# Patient Record
Sex: Female | Born: 1955
Health system: Southern US, Community
[De-identification: ages and names within clinical notes are randomized; demographics above are authoritative.]

## PROBLEM LIST (undated history)

## (undated) DIAGNOSIS — I213 ST elevation (STEMI) myocardial infarction of unspecified site: Secondary | ICD-10-CM

## (undated) DIAGNOSIS — E7800 Pure hypercholesterolemia, unspecified: Secondary | ICD-10-CM

## (undated) DIAGNOSIS — K5090 Crohn's disease, unspecified, without complications: Secondary | ICD-10-CM

## (undated) DIAGNOSIS — E785 Hyperlipidemia, unspecified: Secondary | ICD-10-CM

## (undated) DIAGNOSIS — K219 Gastro-esophageal reflux disease without esophagitis: Secondary | ICD-10-CM

## (undated) DIAGNOSIS — I2510 Atherosclerotic heart disease of native coronary artery without angina pectoris: Secondary | ICD-10-CM

## (undated) DIAGNOSIS — I10 Essential (primary) hypertension: Secondary | ICD-10-CM

## (undated) DIAGNOSIS — K5010 Crohn's disease of large intestine without complications: Secondary | ICD-10-CM

## (undated) HISTORY — DX: Hyperlipidemia, unspecified: E78.5

## (undated) HISTORY — PX: SMALL INTESTINE SURGERY: SHX150

## (undated) HISTORY — PX: TONSILLECTOMY: SUR1361

---

## 2003-04-14 HISTORY — PX: CHOLECYSTECTOMY: SHX55

## 2012-04-13 HISTORY — PX: BACK SURGERY: SHX140

## 2013-04-28 ENCOUNTER — Ambulatory Visit
Admission: RE | Admit: 2013-04-28 | Discharge: 2013-04-28 | Disposition: A | Discharge status: Home / Self Care | Payer: No Typology Code available for payment source | Source: Ambulatory Visit | Attending: Family Medicine | Admitting: Family Medicine

## 2013-04-28 ENCOUNTER — Other Ambulatory Visit: Payer: Self-pay | Admitting: Family Medicine

## 2013-04-28 DIAGNOSIS — J189 Pneumonia, unspecified organism: Secondary | ICD-10-CM

## 2017-02-18 ENCOUNTER — Emergency Department (HOSPITAL_COMMUNITY)
Admission: EM | Admit: 2017-02-18 | Discharge: 2017-02-19 | Disposition: A | Discharge status: Home / Self Care | Payer: BLUE CROSS/BLUE SHIELD | Attending: Emergency Medicine | Admitting: Emergency Medicine

## 2017-02-18 ENCOUNTER — Encounter (HOSPITAL_COMMUNITY): Payer: Self-pay | Admitting: Nurse Practitioner

## 2017-02-18 DIAGNOSIS — F17210 Nicotine dependence, cigarettes, uncomplicated: Secondary | ICD-10-CM | POA: Insufficient documentation

## 2017-02-18 DIAGNOSIS — E876 Hypokalemia: Secondary | ICD-10-CM

## 2017-02-18 DIAGNOSIS — N390 Urinary tract infection, site not specified: Secondary | ICD-10-CM | POA: Diagnosis not present

## 2017-02-18 DIAGNOSIS — Z79899 Other long term (current) drug therapy: Secondary | ICD-10-CM | POA: Insufficient documentation

## 2017-02-18 DIAGNOSIS — R42 Dizziness and giddiness: Secondary | ICD-10-CM | POA: Diagnosis present

## 2017-02-18 DIAGNOSIS — N179 Acute kidney failure, unspecified: Secondary | ICD-10-CM | POA: Diagnosis not present

## 2017-02-18 HISTORY — DX: Crohn's disease of large intestine without complications: K50.10

## 2017-02-18 LAB — CBC
HCT: 39.1 % (ref 36.0–46.0)
Hemoglobin: 14.1 g/dL (ref 12.0–15.0)
MCH: 31.8 pg (ref 26.0–34.0)
MCHC: 36.1 g/dL — ABNORMAL HIGH (ref 30.0–36.0)
MCV: 88.1 fL (ref 78.0–100.0)
Platelets: 486 10*3/uL — ABNORMAL HIGH (ref 150–400)
RBC: 4.44 MIL/uL (ref 3.87–5.11)
RDW: 14 % (ref 11.5–15.5)
WBC: 15 10*3/uL — ABNORMAL HIGH (ref 4.0–10.5)

## 2017-02-18 LAB — URINALYSIS, ROUTINE W REFLEX MICROSCOPIC
Bilirubin Urine: NEGATIVE
Glucose, UA: NEGATIVE mg/dL
Ketones, ur: NEGATIVE mg/dL
Nitrite: NEGATIVE
Protein, ur: 100 mg/dL — AB
Specific Gravity, Urine: 1.028 (ref 1.005–1.030)
pH: 6 (ref 5.0–8.0)

## 2017-02-18 LAB — BASIC METABOLIC PANEL
Anion gap: 10 (ref 5–15)
BUN: 24 mg/dL — ABNORMAL HIGH (ref 6–20)
CO2: 17 mmol/L — ABNORMAL LOW (ref 22–32)
Calcium: 8.5 mg/dL — ABNORMAL LOW (ref 8.9–10.3)
Chloride: 109 mmol/L (ref 101–111)
Creatinine, Ser: 2.03 mg/dL — ABNORMAL HIGH (ref 0.44–1.00)
GFR calc Af Amer: 29 mL/min — ABNORMAL LOW (ref >60)
GFR calc non Af Amer: 25 mL/min — ABNORMAL LOW (ref >60)
Glucose, Bld: 172 mg/dL — ABNORMAL HIGH (ref 65–99)
Potassium: 2.6 mmol/L — CL (ref 3.5–5.1)
Sodium: 136 mmol/L (ref 135–145)

## 2017-02-18 MED ORDER — SODIUM CHLORIDE 0.9 % IV BOLUS (SEPSIS)
2000.0000 mL | Freq: Once | INTRAVENOUS | Status: AC
Start: 2017-02-18 — End: 2017-02-19
  Administered 2017-02-18: 2000 mL via INTRAVENOUS

## 2017-02-18 MED ORDER — MAGNESIUM SULFATE 2 GM/50ML IV SOLN
2.0000 g | Freq: Once | INTRAVENOUS | Status: AC
  Administered 2017-02-18: 2 g via INTRAVENOUS
  Filled 2017-02-18: qty 50

## 2017-02-18 MED ORDER — POTASSIUM CHLORIDE 10 MEQ/100ML IV SOLN
10.0000 meq | INTRAVENOUS | Status: AC
  Administered 2017-02-18 (×2): 10 meq via INTRAVENOUS
  Filled 2017-02-18 (×2): qty 100

## 2017-02-18 MED ORDER — POTASSIUM CHLORIDE CRYS ER 20 MEQ PO TBCR
40.0000 meq | EXTENDED_RELEASE_TABLET | Freq: Once | ORAL | Status: AC
  Administered 2017-02-18: 40 meq via ORAL
  Filled 2017-02-18: qty 2

## 2017-02-18 MED ORDER — LIDOCAINE HCL (PF) 1 % IJ SOLN
INTRAMUSCULAR | Status: AC
  Administered 2017-02-18: 2.1 mL
  Filled 2017-02-18: qty 5

## 2017-02-18 MED ORDER — CEFTRIAXONE SODIUM 1 G IJ SOLR
1.0000 g | Freq: Once | INTRAMUSCULAR | Status: AC
  Administered 2017-02-18: 1 g via INTRAMUSCULAR
  Filled 2017-02-18: qty 10

## 2017-02-18 MED ORDER — SODIUM CHLORIDE 0.9 % IV BOLUS (SEPSIS): 1000.0000 mL | Freq: Once | INTRAVENOUS | Status: DC

## 2017-02-18 NOTE — ED Notes (Signed)
Dr. Dolly Rias notified on pt.'s hypokalemia.

## 2017-02-18 NOTE — ED Triage Notes (Signed)
Pt endorses dizziness upon awakening from bed over the past 4-5 days. States dizziness begins when she goes from laying to standing and lasts for hours. Pt endorses generalized weakness. Pt denies cp, blurry vision, slurred speech, syncope, falls.

## 2017-02-19 ENCOUNTER — Emergency Department (HOSPITAL_COMMUNITY): Payer: BLUE CROSS/BLUE SHIELD

## 2017-02-19 MED ORDER — POTASSIUM CHLORIDE CRYS ER 20 MEQ PO TBCR: 40.0000 meq | EXTENDED_RELEASE_TABLET | Freq: Two times a day (BID) | ORAL | 0 refills | Status: DC

## 2017-02-19 MED ORDER — CEPHALEXIN 500 MG PO CAPS: 500.0000 mg | ORAL_CAPSULE | Freq: Four times a day (QID) | ORAL | 0 refills | Status: DC

## 2017-02-19 NOTE — ED Provider Notes (Signed)
[facility] EMERGENCY DEPARTMENT Provider Note   CSN: [id number] Arrival date & time: 02/18/17  1931     History   Chief Complaint Chief Complaint  Patient presents with  . Dizziness    HPI Anne [name] is a 61 y.o. female.   Dizziness  Quality:  Lightheadedness Severity:  Moderate Onset quality:  Gradual Duration:  1 day Timing:  Constant Chronicity:  Recurrent Relieved by:  None tried Worsened by:  Nothing Ineffective treatments:  None tried   Past Medical History:  Diagnosis Date  . Crohn's colitis ([place])     There are no active problems to display for this patient.   Past Surgical History:  Procedure Laterality Date  . SMALL INTESTINE SURGERY      OB History    No data available       Home Medications    Prior to Admission medications   Medication Sig Start Date End Date Taking? Authorizing Provider  AZASAN 75 MG TABS Take 150 mg daily by mouth. 01/25/17  Yes [provider]  calcium carbonate (OSCAL) 1500 (600 Ca) MG TABS tablet Take 600 mg of elemental calcium 2 (two) times daily with a meal by mouth.   Yes [provider]  cyclobenzaprine (FLEXERIL) 10 MG tablet Take 10 mg at bedtime by mouth. 01/14/17  Yes [provider]  famotidine (PEPCID) 40 MG tablet Take 40 mg at bedtime by mouth. 01/14/17  Yes [provider]  vitamin E 100 UNIT capsule Take 100 Units daily by mouth.   Yes [provider]  vitamin E 600 UNIT capsule Take 600 Units 2 (two) times daily by mouth.   Yes [provider]  cephALEXin (KEFLEX) 500 MG capsule Take 1 capsule (500 mg total) 4 (four) times daily by mouth. 02/19/17   Shriya Aker, Corene Cornea, MD  potassium chloride SA (K-DUR,KLOR-CON) 20 MEQ tablet Take 2 tablets (40 mEq total) 2 (two) times daily for 14 days by mouth. 02/19/17 03/05/17  Liliana Dang, Corene Cornea, MD    Family History No family history on file.  Social History Social History   Tobacco Use  .  Smoking status: Current Some Day Smoker    Packs/day: 0.50  . Smokeless tobacco: Never Used  Substance Use Topics  . Alcohol use: No    Frequency: Never  . Drug use: No     Allergies   Patient has no known allergies.   Review of Systems Review of Systems  Neurological: Positive for dizziness.  All other systems reviewed and are negative.    Physical Exam Updated Vital Signs BP 98/75   Pulse (!) 101   Temp 97.9 F (36.6 C) (Axillary)   Resp (!) 21   Ht 5' 9"  (1.753 m)   Wt 74.8 kg (165 lb)   LMP 02/18/2001   SpO2 99%   BMI 24.37 kg/m   Physical Exam  Constitutional: She is oriented to person, place, and time. She appears well-developed and well-nourished.  HENT:  Head: Normocephalic and atraumatic.  Eyes: Conjunctivae and EOM are normal.  Neck: Normal range of motion.  Cardiovascular: Regular rhythm. Tachycardia present.  Pulmonary/Chest: Effort normal and breath sounds normal. No stridor. No respiratory distress.  Abdominal: Soft. Bowel sounds are normal. She exhibits no distension.  Musculoskeletal: Normal range of motion. She exhibits no edema or deformity.  Neurological: She is alert and oriented to person, place, and time. No cranial nerve deficit. Coordination normal.  Skin: Skin is warm and dry.  Nursing note  and vitals reviewed.    ED Treatments / Results  Labs (all labs ordered are listed, but only abnormal results are displayed) Labs Reviewed  BASIC METABOLIC PANEL - Abnormal; Notable for the following components:      Result Value   Potassium 2.6 (*)    CO2 17 (*)    Glucose, Bld 172 (*)    BUN 24 (*)    Creatinine, Ser 2.03 (*)    Calcium 8.5 (*)    GFR calc non Af Amer 25 (*)    GFR calc Af Amer 29 (*)    All other components within normal limits  CBC - Abnormal; Notable for the following components:   WBC 15.0 (*)    MCHC 36.1 (*)    Platelets 486 (*)    All other components within normal limits  URINALYSIS, ROUTINE W REFLEX  MICROSCOPIC - Abnormal; Notable for the following components:   Color, Urine AMBER (*)    APPearance HAZY (*)    Hgb urine dipstick MODERATE (*)    Protein, ur 100 (*)    Leukocytes, UA MODERATE (*)    Bacteria, UA FEW (*)    Squamous Epithelial / LPF 0-5 (*)    All other components within normal limits  URINE CULTURE    EKG  EKG Interpretation  Date/Time:  Thursday February 18 2017 19:38:51 EST Ventricular Rate:  118 PR Interval:  112 QRS Duration: 80 QT Interval:  322 QTC Calculation: 451 R Axis:   58 Text Interpretation:  Sinus tachycardia Low voltage QRS Nonspecific ST and T wave abnormality Abnormal ECG Confirmed by Merrily Pew [phone]) on 02/18/2017 8:36:09 PM       Radiology No results found.  Procedures Procedures (including critical care time)  Medications Ordered in ED Medications  sodium chloride 0.9 % bolus 2,000 mL (2,000 mLs Intravenous New Bag/Given 02/18/17 2209)  magnesium sulfate IVPB 2 g 50 mL (2 g Intravenous New Bag/Given 02/18/17 2200)  potassium chloride 10 mEq in 100 mL IVPB (10 mEq Intravenous New Bag/Given 02/18/17 2326)  potassium chloride SA (K-DUR,KLOR-CON) CR tablet 40 mEq (40 mEq Oral Given 02/18/17 2200)  cefTRIAXone (ROCEPHIN) injection 1 g (1 g Intramuscular Given 02/18/17 2159)  lidocaine (PF) (XYLOCAINE) 1 % injection (2.1 mLs  Given 02/18/17 2200)     Initial Impression / Assessment and Plan / ED Course  I have reviewed the triage vital signs and the nursing notes.  Pertinent labs & imaging results that were available during my care of the patient were reviewed by me and considered in my medical decision making (see chart for details).     H/o crohns and thus has persistently abnormal potassium and has not been taking it recently for some reason. Found to have UTI likely relating to dehydration and hypokalemia. repleted here, will start K at home as well. Already has an appointment with primary providers on Tuesday so will follow with  them for recheck.   Final Clinical Impressions(s) / ED Diagnoses   Final diagnoses:  Hypokalemia  AKI (acute kidney injury) ([place])  Urinary tract infection without hematuria, site unspecified    ED Discharge Orders        Ordered    potassium chloride SA (K-DUR,KLOR-CON) 20 MEQ tablet  2 times daily     02/19/17 0051    cephALEXin (KEFLEX) 500 MG capsule  4 times daily     02/19/17 0051       Thierry Dobosz, Corene Cornea, MD 02/20/17 [phone]

## 2017-02-19 NOTE — ED Notes (Signed)
Patient transported to X-ray 

## 2017-02-19 NOTE — ED Notes (Signed)
Patient returned from X-ray 

## 2017-02-21 LAB — URINE CULTURE: Culture: >=100000 — AB

## 2017-02-22 ENCOUNTER — Telehealth: Payer: Self-pay | Admitting: Emergency Medicine

## 2017-02-22 NOTE — Telephone Encounter (Signed)
Post ED Visit - Positive Culture Follow-up  Culture report reviewed by antimicrobial stewardship pharmacist:  []  Elenor Quinones, Pharm.D. []  Heide Guile, Pharm.D., BCPS AQ-ID []  Parks Neptune, Pharm.D., BCPS [x]  Alycia Rossetti, Pharm.D., BCPS []  [place], Pharm.D., BCPS, AAHIVP []  Legrand Como, Pharm.D., BCPS, AAHIVP []  Salome Arnt, PharmD, BCPS []  Dimitri Ped, PharmD, BCPS []  Vincenza Hews, PharmD, BCPS  Positive urine culture Treated with cephalexin, organism sensitive to the same and no further patient follow-up is required at this time.  Hazle Nordmann 02/22/2017, 12:24 PM

## 2017-06-27 ENCOUNTER — Other Ambulatory Visit: Payer: Self-pay

## 2017-06-27 ENCOUNTER — Emergency Department (HOSPITAL_COMMUNITY)
Admission: EM | Admit: 2017-06-27 | Discharge: 2017-06-27 | Disposition: A | Discharge status: Home / Self Care | Payer: BLUE CROSS/BLUE SHIELD | Attending: Emergency Medicine | Admitting: Emergency Medicine

## 2017-06-27 ENCOUNTER — Encounter (HOSPITAL_COMMUNITY): Payer: Self-pay

## 2017-06-27 DIAGNOSIS — N390 Urinary tract infection, site not specified: Secondary | ICD-10-CM | POA: Insufficient documentation

## 2017-06-27 DIAGNOSIS — F17210 Nicotine dependence, cigarettes, uncomplicated: Secondary | ICD-10-CM | POA: Diagnosis not present

## 2017-06-27 DIAGNOSIS — R531 Weakness: Secondary | ICD-10-CM | POA: Diagnosis present

## 2017-06-27 DIAGNOSIS — Z79899 Other long term (current) drug therapy: Secondary | ICD-10-CM | POA: Insufficient documentation

## 2017-06-27 LAB — COMPREHENSIVE METABOLIC PANEL
ALT: 17 U/L (ref 14–54)
AST: 21 U/L (ref 15–41)
Albumin: 4.1 g/dL (ref 3.5–5.0)
Alkaline Phosphatase: 67 U/L (ref 38–126)
Anion gap: 10 (ref 5–15)
BUN: 17 mg/dL (ref 6–20)
CO2: 15 mmol/L — ABNORMAL LOW (ref 22–32)
Calcium: 8.6 mg/dL — ABNORMAL LOW (ref 8.9–10.3)
Chloride: 113 mmol/L — ABNORMAL HIGH (ref 101–111)
Creatinine, Ser: 1.58 mg/dL — ABNORMAL HIGH (ref 0.44–1.00)
GFR calc Af Amer: 39 mL/min — ABNORMAL LOW (ref >60)
GFR calc non Af Amer: 34 mL/min — ABNORMAL LOW (ref >60)
Glucose, Bld: 85 mg/dL (ref 65–99)
Potassium: 3.2 mmol/L — ABNORMAL LOW (ref 3.5–5.1)
Sodium: 138 mmol/L (ref 135–145)
Total Bilirubin: 0.7 mg/dL (ref 0.3–1.2)
Total Protein: 7.7 g/dL (ref 6.5–8.1)

## 2017-06-27 LAB — CBC
HCT: 41.3 % (ref 36.0–46.0)
Hemoglobin: 14.2 g/dL (ref 12.0–15.0)
MCH: 33.5 pg (ref 26.0–34.0)
MCHC: 34.4 g/dL (ref 30.0–36.0)
MCV: 97.4 fL (ref 78.0–100.0)
Platelets: 417 10*3/uL — ABNORMAL HIGH (ref 150–400)
RBC: 4.24 MIL/uL (ref 3.87–5.11)
RDW: 15 % (ref 11.5–15.5)
WBC: 7.7 10*3/uL (ref 4.0–10.5)

## 2017-06-27 LAB — URINALYSIS, ROUTINE W REFLEX MICROSCOPIC
Bacteria, UA: NONE SEEN
Bilirubin Urine: NEGATIVE
Glucose, UA: NEGATIVE mg/dL
Ketones, ur: NEGATIVE mg/dL
Nitrite: NEGATIVE
Protein, ur: 100 mg/dL — AB
Specific Gravity, Urine: 1.019 (ref 1.005–1.030)
pH: 5 (ref 5.0–8.0)

## 2017-06-27 LAB — LIPASE, BLOOD: Lipase: 47 U/L (ref 11–51)

## 2017-06-27 MED ORDER — SODIUM CHLORIDE 0.9 % IV BOLUS (SEPSIS)
1000.0000 mL | Freq: Once | INTRAVENOUS | Status: AC
  Administered 2017-06-27: 1000 mL via INTRAVENOUS

## 2017-06-27 MED ORDER — POTASSIUM CHLORIDE CRYS ER 20 MEQ PO TBCR: 20.0000 meq | EXTENDED_RELEASE_TABLET | Freq: Every day | ORAL | 0 refills | Status: DC

## 2017-06-27 MED ORDER — POTASSIUM CHLORIDE CRYS ER 20 MEQ PO TBCR
40.0000 meq | EXTENDED_RELEASE_TABLET | Freq: Once | ORAL | Status: AC
  Administered 2017-06-27: 40 meq via ORAL
  Filled 2017-06-27: qty 2

## 2017-06-27 MED ORDER — HYDROMORPHONE HCL 1 MG/ML IJ SOLN
0.7500 mg | Freq: Once | INTRAMUSCULAR | Status: AC
  Administered 2017-06-27: 0.75 mg via INTRAVENOUS
  Filled 2017-06-27: qty 1

## 2017-06-27 MED ORDER — SODIUM CHLORIDE 0.9 % IV SOLN
2.0000 g | Freq: Once | INTRAVENOUS | Status: AC
  Administered 2017-06-27: 2 g via INTRAVENOUS
  Filled 2017-06-27: qty 20

## 2017-06-27 MED ORDER — CEPHALEXIN 500 MG PO CAPS: 500.0000 mg | ORAL_CAPSULE | Freq: Three times a day (TID) | ORAL | 0 refills | Status: DC

## 2017-06-27 NOTE — ED Triage Notes (Signed)
PT reports hematuria, weakness, abdominal pain, dysuria x 3 days. Pt reports uti in February.

## 2017-06-27 NOTE — ED Notes (Signed)
Pt verbalized understanding of all d/c instructions, prescriptions, and f/u information. VSS. All belongings with patient at this time. Pt ambulatory to lobby with steady gait accompanied by son

## 2017-06-27 NOTE — ED Notes (Signed)
Pt given hot tea per EDP

## 2017-06-27 NOTE — ED Notes (Signed)
Per microbiology, no urine culture in lab at this time. Will ask pt to void again to obtain urine culture

## 2017-06-27 NOTE — ED Notes (Signed)
Pt ambulated to room from waiting room, tolerated well. 

## 2017-06-27 NOTE — ED Notes (Signed)
ED Provider at bedside. 

## 2017-06-27 NOTE — ED Notes (Signed)
Pt reports she does not have to urinate at this time but will attempt after her fluids have run in

## 2017-06-28 LAB — URINE CULTURE: Culture: NO GROWTH

## 2017-06-30 NOTE — ED Provider Notes (Signed)
[facility] EMERGENCY DEPARTMENT Provider Note   CSN: [id number] Arrival date & time: 06/27/17  0856     History   Chief Complaint Chief Complaint  Patient presents with  . Hematuria  . Abdominal Pain    HPI Anne [name] is a 62 y.o. female.  HPI   62 year old female with hematuria, generalized weakness and abdominal pain.  Onset about 3 days ago.  Dysuria.  No fevers or chills.  No nausea or vomiting.  No vomiting or diarrhea.  She reports being diagnosed with urinary tract infection last month when she was treated for.  Past Medical History:  Diagnosis Date  . Crohn's colitis ([place])     There are no active problems to display for this patient.   Past Surgical History:  Procedure Laterality Date  . SMALL INTESTINE SURGERY      OB History    No data available       Home Medications    Prior to Admission medications   Medication Sig Start Date End Date Taking? Authorizing Provider  alendronate (FOSAMAX) 70 MG tablet Take 70 mg by mouth once a week. Take with a full glass of water on an empty stomach.   Yes [provider]  AZASAN 75 MG TABS Take 187.5 mg by mouth daily.  01/25/17  Yes [provider]  calcium carbonate (OSCAL) 1500 (600 Ca) MG TABS tablet Take 600 mg of elemental calcium 2 (two) times daily with a meal by mouth.   Yes [provider]  ciprofloxacin (CIPRO) 500 MG tablet Take 500 mg by mouth every 12 (twelve) hours.   Yes [provider]  diphenoxylate-atropine (LOMOTIL) 2.5-0.025 MG tablet Take 1 tablet by mouth daily.   Yes [provider]  famotidine (PEPCID) 40 MG tablet Take 40 mg at bedtime by mouth. 01/14/17  Yes [provider]  vitamin E 100 UNIT capsule Take 100 Units daily by mouth.   Yes [provider]  cephALEXin (KEFLEX) 500 MG capsule Take 1 capsule (500 mg total) by mouth 3 (three) times daily. 06/27/17   Virgel Manifold, MD  potassium chloride SA  (K-DUR,KLOR-CON) 20 MEQ tablet Take 1 tablet (20 mEq total) by mouth daily. 06/27/17   Virgel Manifold, MD    Family History No family history on file.  Social History Social History   Tobacco Use  . Smoking status: Current Some Day Smoker    Packs/day: 0.50  . Smokeless tobacco: Never Used  Substance Use Topics  . Alcohol use: No    Frequency: Never  . Drug use: No     Allergies   Patient has no known allergies.   Review of Systems Review of Systems  All systems reviewed and negative, other than as noted in HPI.  Physical Exam Updated Vital Signs BP 107/72   Pulse 61   Temp 98.4 F (36.9 C) (Oral)   Resp 14   LMP 02/18/2001   SpO2 100%   Physical Exam  Constitutional: She appears well-developed and well-nourished. No distress.  HENT:  Head: Normocephalic and atraumatic.  Eyes: Conjunctivae are normal. Right eye exhibits no discharge. Left eye exhibits no discharge.  Neck: Neck supple.  Cardiovascular: Normal rate, regular rhythm and normal heart sounds. Exam reveals no gallop and no friction rub.  No murmur heard. Pulmonary/Chest: Effort normal and breath sounds normal. No respiratory distress.  Abdominal: Soft. She exhibits no distension. There is no tenderness.  Musculoskeletal: She exhibits no edema or tenderness.  Neurological: She is alert.  Skin: Skin is warm and dry.  Psychiatric: She has a normal mood and affect. Her behavior is normal. Thought content normal.  Nursing note and vitals reviewed.    ED Treatments / Results  Labs (all labs ordered are listed, but only abnormal results are displayed) Labs Reviewed  COMPREHENSIVE METABOLIC PANEL - Abnormal; Notable for the following components:      Result Value   Potassium 3.2 (*)    Chloride 113 (*)    CO2 15 (*)    Creatinine, Ser 1.58 (*)    Calcium 8.6 (*)    GFR calc non Af Amer 34 (*)    GFR calc Af Amer 39 (*)    All other components within normal limits  CBC - Abnormal; Notable for the  following components:   Platelets 417 (*)    All other components within normal limits  URINALYSIS, ROUTINE W REFLEX MICROSCOPIC - Abnormal; Notable for the following components:   APPearance HAZY (*)    Hgb urine dipstick LARGE (*)    Protein, ur 100 (*)    Leukocytes, UA LARGE (*)    Squamous Epithelial / LPF 0-5 (*)    All other components within normal limits  URINE CULTURE  LIPASE, BLOOD    EKG  EKG Interpretation None       Radiology No results found.  Procedures Procedures (including critical care time)  Medications Ordered in ED Medications  cefTRIAXone (ROCEPHIN) 2 g in sodium chloride 0.9 % 100 mL IVPB (0 g Intravenous Stopped 06/27/17 1429)  potassium chloride SA (K-DUR,KLOR-CON) CR tablet 40 mEq (40 mEq Oral Given 06/27/17 1333)  HYDROmorphone (DILAUDID) injection 0.75 mg (0.75 mg Intravenous Given 06/27/17 1333)  sodium chloride 0.9 % bolus 1,000 mL (0 mLs Intravenous Stopped 06/27/17 1545)     Initial Impression / Assessment and Plan / ED Course  I have reviewed the triage vital signs and the nursing notes.  Pertinent labs & imaging results that were available during my care of the patient were reviewed by me and considered in my medical decision making (see chart for details).     62 year old female with abdominal pain.  Appears that she still may have a UTI.  Abdominal exam is benign.  She is afebrile.  He dynamically stable.  Final Clinical Impressions(s) / ED Diagnoses   Final diagnoses:  Lower urinary tract infectious disease    ED Discharge Orders        Ordered    cephALEXin (KEFLEX) 500 MG capsule  3 times daily     06/27/17 1555    potassium chloride SA (K-DUR,KLOR-CON) 20 MEQ tablet  Daily     06/27/17 1609       Virgel Manifold, MD 06/30/17 [phone]

## 2017-10-15 ENCOUNTER — Encounter (HOSPITAL_COMMUNITY): Payer: Self-pay

## 2017-10-15 ENCOUNTER — Other Ambulatory Visit: Payer: Self-pay

## 2017-10-15 ENCOUNTER — Emergency Department (HOSPITAL_COMMUNITY)
Admission: EM | Admit: 2017-10-15 | Discharge: 2017-10-15 | Disposition: A | Discharge status: Home / Self Care | Payer: BLUE CROSS/BLUE SHIELD | Attending: Emergency Medicine | Admitting: Emergency Medicine

## 2017-10-15 DIAGNOSIS — M5431 Sciatica, right side: Secondary | ICD-10-CM | POA: Diagnosis not present

## 2017-10-15 DIAGNOSIS — F17200 Nicotine dependence, unspecified, uncomplicated: Secondary | ICD-10-CM | POA: Diagnosis not present

## 2017-10-15 DIAGNOSIS — M79604 Pain in right leg: Secondary | ICD-10-CM | POA: Diagnosis present

## 2017-10-15 DIAGNOSIS — Z79899 Other long term (current) drug therapy: Secondary | ICD-10-CM | POA: Insufficient documentation

## 2017-10-15 MED ORDER — OXYCODONE-ACETAMINOPHEN 5-325 MG PO TABS
1.0000 | ORAL_TABLET | Freq: Once | ORAL | Status: AC
  Administered 2017-10-15: 1 via ORAL
  Filled 2017-10-15: qty 1

## 2017-10-15 MED ORDER — METHOCARBAMOL 500 MG PO TABS: 500.0000 mg | ORAL_TABLET | Freq: Two times a day (BID) | ORAL | 0 refills | Status: DC

## 2017-10-15 MED ORDER — ACETAMINOPHEN-CODEINE #4 300-60 MG PO TABS: 1.0000 | ORAL_TABLET | Freq: Four times a day (QID) | ORAL | 0 refills | Status: DC | PRN

## 2017-10-15 MED ORDER — PREDNISONE 20 MG PO TABS
60.0000 mg | ORAL_TABLET | Freq: Once | ORAL | Status: AC
  Administered 2017-10-15: 60 mg via ORAL
  Filled 2017-10-15: qty 3

## 2017-10-15 MED ORDER — METHYLPREDNISOLONE 4 MG PO TBPK: ORAL_TABLET | ORAL | 0 refills | Status: DC

## 2017-10-15 MED ORDER — METHOCARBAMOL 500 MG PO TABS
1000.0000 mg | ORAL_TABLET | Freq: Once | ORAL | Status: AC
  Administered 2017-10-15: 1000 mg via ORAL
  Filled 2017-10-15: qty 2

## 2017-10-15 MED ORDER — KETOROLAC TROMETHAMINE 30 MG/ML IJ SOLN
15.0000 mg | Freq: Once | INTRAMUSCULAR | Status: AC
  Administered 2017-10-15: 15 mg via INTRAMUSCULAR
  Filled 2017-10-15: qty 1

## 2017-10-15 MED ORDER — LIDOCAINE 5 % EX PTCH
1.0000 | MEDICATED_PATCH | CUTANEOUS | Status: DC
  Administered 2017-10-15: 1 via TRANSDERMAL
  Filled 2017-10-15: qty 1

## 2017-10-15 NOTE — ED Notes (Signed)
Patient still requesting crutches, states she thinks she needs them, despite the ED provider discussing this with her. Will notify PA-C.

## 2017-10-15 NOTE — Discharge Instructions (Signed)
Please take steroids as directed, you may use Robaxin instead of cyclobenzaprine will as a muscle relaxer, and Tylenol for as needed for breakthrough pain.  I would also like you to use over-the-counter salon pas lidocaine patches to help with pain, as well as ice and heat.  I provided a list of exercises to help with sciatic nerve inflammation.  He will need to follow-up with primary care and/or orthopedics.  Return to the emergency department if you develop fever significantly worsened pain, loss of bowel or bladder control, weakness or numbness in your legs or any other new or concerning symptoms.

## 2017-10-15 NOTE — ED Triage Notes (Signed)
Pt c/o right leg pain starting in her hip radiating down to ankle X1 month.

## 2017-10-15 NOTE — ED Provider Notes (Signed)
[facility] EMERGENCY DEPARTMENT Provider Note   CSN: [id number] Arrival date & time: 10/15/17  1156     History   Chief Complaint Chief Complaint  Patient presents with  . Leg Pain    HPI Anne [name] is a 62 y.o. female.  Anne [name] is a 62 y.o. Female with a history of Crohn's disease, presents to the emergency department for evaluation of pain radiating from her right hip down to her ankle.  Patient reports pain is been present for the past month and progressively worsening.  She reports pain is especially bad with walking or forward bending.  She reports when she bends forward the pain seems to shoot down her leg to her ankle she reports some occasional tingling in her toes but no numbness or weakness.  Patient has been ambulatory in the department with a cane with some discomfort.  Patient denies any pain or symptoms in the left leg.  Denies loss of bowel or bladder control, saddle anesthesia.  Patient denies fevers, abdominal pain or dysuria.  No history of cancer or IV drug use.  He is not currently on any steroids for her Crohn's.  Has not followed up with anyone about this outpatient, reports most of her doctors are in [place].     Past Medical History:  Diagnosis Date  . Crohn's colitis ([place])     There are no active problems to display for this patient.   Past Surgical History:  Procedure Laterality Date  . SMALL INTESTINE SURGERY       OB History   None      Home Medications    Prior to Admission medications   Medication Sig Start Date End Date Taking? Authorizing Provider  alendronate (FOSAMAX) 70 MG tablet Take 70 mg by mouth once a week. Take with a full glass of water on an empty stomach.    [provider]  AZASAN 75 MG TABS Take 187.5 mg by mouth daily.  01/25/17   [provider]  calcium carbonate (OSCAL) 1500 (600 Ca) MG TABS tablet Take 600 mg of elemental calcium 2 (two) times daily with a meal by mouth.     [provider]  cephALEXin (KEFLEX) 500 MG capsule Take 1 capsule (500 mg total) by mouth 3 (three) times daily. 06/27/17   Virgel Manifold, MD  ciprofloxacin (CIPRO) 500 MG tablet Take 500 mg by mouth every 12 (twelve) hours.    [provider]  diphenoxylate-atropine (LOMOTIL) 2.5-0.025 MG tablet Take 1 tablet by mouth daily.    [provider]  famotidine (PEPCID) 40 MG tablet Take 40 mg at bedtime by mouth. 01/14/17   [provider]  potassium chloride SA (K-DUR,KLOR-CON) 20 MEQ tablet Take 1 tablet (20 mEq total) by mouth daily. 06/27/17   Virgel Manifold, MD  vitamin E 100 UNIT capsule Take 100 Units daily by mouth.    [provider]    Family History No family history on file.  Social History Social History   Tobacco Use  . Smoking status: Current Some Day Smoker    Packs/day: 0.50  . Smokeless tobacco: Never Used  Substance Use Topics  . Alcohol use: No    Frequency: Never  . Drug use: No     Allergies   Patient has no known allergies.   Review of Systems Review of Systems  Constitutional: Negative for chills and fever.  Respiratory: Negative for shortness of breath.   Cardiovascular: Negative  for chest pain.  Gastrointestinal: Negative for abdominal distention, constipation, nausea and vomiting.  Genitourinary: Negative for difficulty urinating, dysuria and frequency.  Musculoskeletal: Positive for arthralgias, back pain and myalgias. Negative for gait problem and neck pain.  Neurological: Negative for weakness and numbness.     Physical Exam Updated Vital Signs BP 117/87 (BP Location: Right Arm)   Pulse 94   Temp 98.8 F (37.1 C) (Oral)   Resp 16   Ht 5' 9"  (1.753 m)   Wt 69.9 kg (154 lb)   LMP 02/18/2001   SpO2 100%   BMI 22.74 kg/m   Physical Exam  Constitutional: She is oriented to person, place, and time. She appears well-developed and well-nourished. No distress.  HENT:  Head: Normocephalic and  atraumatic.  Eyes: Right eye exhibits no discharge. Left eye exhibits no discharge.  Neck: Normal range of motion. Neck supple.  Cardiovascular: Normal rate, regular rhythm, normal heart sounds and intact distal pulses.  Pulmonary/Chest: Effort normal and breath sounds normal. No respiratory distress.  Abdominal: Soft. Bowel sounds are normal. She exhibits no distension and no mass. There is no tenderness. There is no guarding.  Abdomen soft, nondistended, nontender to palpation in all quadrants without guarding or peritoneal signs  Musculoskeletal:  There is no midline lumbar tenderness, tenderness over the right lower back over the sciatic notch, positive right straight leg raise, no tenderness over the left back  Neurological: She is alert and oriented to person, place, and time. Coordination normal.  Patient able to move lower extremities with mild discomfort but no difficulty.  She has 5/5 strength bilaterally with dorsi and plantar flexion, and proximal and distal muscles.  Sensation intact throughout the lower extremity.  2+ DP and TP pulses.  Skin: Skin is warm and dry. She is not diaphoretic.  Psychiatric: She has a normal mood and affect. Her behavior is normal.  Nursing note and vitals reviewed.    ED Treatments / Results  Labs (all labs ordered are listed, but only abnormal results are displayed) Labs Reviewed - No data to display  EKG None  Radiology No results found.  Procedures Procedures (including critical care time)  Medications Ordered in ED Medications  lidocaine (LIDODERM) 5 % 1 patch (1 patch Transdermal Patch Applied 10/15/17 1317)  oxyCODONE-acetaminophen (PERCOCET/ROXICET) 5-325 MG per tablet 1 tablet (1 tablet Oral Given 10/15/17 1315)  methocarbamol (ROBAXIN) tablet 1,000 mg (1,000 mg Oral Given 10/15/17 1315)  ketorolac (TORADOL) 30 MG/ML injection 15 mg (15 mg Intramuscular Given 10/15/17 1317)  predniSONE (DELTASONE) tablet 60 mg (60 mg Oral Given 10/15/17  1315)     Initial Impression / Assessment and Plan / ED Course  I have reviewed the triage vital signs and the nursing notes.  Pertinent labs & imaging results that were available during my care of the patient were reviewed by me and considered in my medical decision making (see chart for details).  Normal neurological exam, no evidence of urinary incontinence or retention, pain is consistently reproducible. There is no evidence of AAA or concern for dissection at this time.   Patient can walk but states is painful.  No loss of bowel or bladder control.  No concern for cauda equina.  No fever, night sweats, weight loss, h/o cancer, IVDU.  Pain treated here in the department with adequate improvement. RICE protocol and pain medicine indicated and discussed with patient.  Despite expressing my concern about crutches increasing her pain and risk of falls patient is insistent on having  crutches and they were provided for her today.  I have also discussed reasons to return immediately to the ER.  Patient expresses understanding and agrees with plan.  Final Clinical Impressions(s) / ED Diagnoses   Final diagnoses:  Sciatica of right side    ED Discharge Orders        Ordered    methylPREDNISolone (MEDROL DOSEPAK) 4 MG TBPK tablet     10/15/17 1415    methocarbamol (ROBAXIN) 500 MG tablet  2 times daily     10/15/17 1415    acetaminophen-codeine (TYLENOL #4) 300-60 MG tablet  Every 6 hours PRN     10/15/17 1415       Jacqlyn Larsen, PA-C 10/15/17 1437    Malvin Johns, MD 10/15/17 [phone]

## 2017-10-26 ENCOUNTER — Encounter (HOSPITAL_COMMUNITY): Payer: Self-pay | Admitting: Emergency Medicine

## 2017-10-26 ENCOUNTER — Emergency Department (HOSPITAL_COMMUNITY): Payer: BLUE CROSS/BLUE SHIELD

## 2017-10-26 ENCOUNTER — Encounter (HOSPITAL_COMMUNITY)
Admission: EM | Disposition: A | Discharge status: Home / Self Care | Payer: Self-pay | Source: Home / Self Care | Attending: Cardiovascular Disease

## 2017-10-26 ENCOUNTER — Inpatient Hospital Stay (HOSPITAL_COMMUNITY)
Admission: EM | Admit: 2017-10-26 | Discharge: 2017-10-28 | DRG: 247 | Disposition: A | Discharge status: Home / Self Care | Payer: BLUE CROSS/BLUE SHIELD | Attending: Cardiovascular Disease | Admitting: Cardiovascular Disease

## 2017-10-26 DIAGNOSIS — Y92234 Operating room of hospital as the place of occurrence of the external cause: Secondary | ICD-10-CM | POA: Diagnosis not present

## 2017-10-26 DIAGNOSIS — G8929 Other chronic pain: Secondary | ICD-10-CM | POA: Diagnosis present

## 2017-10-26 DIAGNOSIS — Z7983 Long term (current) use of bisphosphonates: Secondary | ICD-10-CM

## 2017-10-26 DIAGNOSIS — I213 ST elevation (STEMI) myocardial infarction of unspecified site: Secondary | ICD-10-CM | POA: Diagnosis present

## 2017-10-26 DIAGNOSIS — K5090 Crohn's disease, unspecified, without complications: Secondary | ICD-10-CM | POA: Diagnosis present

## 2017-10-26 DIAGNOSIS — I97190 Other postprocedural cardiac functional disturbances following cardiac surgery: Secondary | ICD-10-CM | POA: Diagnosis not present

## 2017-10-26 DIAGNOSIS — I2510 Atherosclerotic heart disease of native coronary artery without angina pectoris: Secondary | ICD-10-CM | POA: Diagnosis not present

## 2017-10-26 DIAGNOSIS — E876 Hypokalemia: Secondary | ICD-10-CM | POA: Diagnosis not present

## 2017-10-26 DIAGNOSIS — Z23 Encounter for immunization: Secondary | ICD-10-CM | POA: Diagnosis not present

## 2017-10-26 DIAGNOSIS — R079 Chest pain, unspecified: Secondary | ICD-10-CM | POA: Diagnosis not present

## 2017-10-26 DIAGNOSIS — E869 Volume depletion, unspecified: Secondary | ICD-10-CM | POA: Diagnosis present

## 2017-10-26 DIAGNOSIS — I9589 Other hypotension: Secondary | ICD-10-CM | POA: Diagnosis present

## 2017-10-26 DIAGNOSIS — F17210 Nicotine dependence, cigarettes, uncomplicated: Secondary | ICD-10-CM | POA: Diagnosis present

## 2017-10-26 DIAGNOSIS — M79604 Pain in right leg: Secondary | ICD-10-CM | POA: Diagnosis present

## 2017-10-26 DIAGNOSIS — Z79899 Other long term (current) drug therapy: Secondary | ICD-10-CM

## 2017-10-26 DIAGNOSIS — Z955 Presence of coronary angioplasty implant and graft: Secondary | ICD-10-CM

## 2017-10-26 DIAGNOSIS — G629 Polyneuropathy, unspecified: Secondary | ICD-10-CM | POA: Diagnosis present

## 2017-10-26 DIAGNOSIS — Y840 Cardiac catheterization as the cause of abnormal reaction of the patient, or of later complication, without mention of misadventure at the time of the procedure: Secondary | ICD-10-CM | POA: Diagnosis not present

## 2017-10-26 DIAGNOSIS — I2111 ST elevation (STEMI) myocardial infarction involving right coronary artery: Principal | ICD-10-CM | POA: Diagnosis present

## 2017-10-26 DIAGNOSIS — R0789 Other chest pain: Secondary | ICD-10-CM | POA: Diagnosis present

## 2017-10-26 HISTORY — PX: CORONARY/GRAFT ACUTE MI REVASCULARIZATION: CATH118305

## 2017-10-26 HISTORY — DX: ST elevation (STEMI) myocardial infarction of unspecified site: I21.3

## 2017-10-26 HISTORY — PX: LEFT HEART CATH AND CORONARY ANGIOGRAPHY: CATH118249

## 2017-10-26 LAB — COMPREHENSIVE METABOLIC PANEL
ALT: 20 U/L (ref 0–44)
AST: 20 U/L (ref 15–41)
Albumin: 3.6 g/dL (ref 3.5–5.0)
Alkaline Phosphatase: 80 U/L (ref 38–126)
Anion gap: 10 (ref 5–15)
BUN: 16 mg/dL (ref 8–23)
CO2: 22 mmol/L (ref 22–32)
Calcium: 9.1 mg/dL (ref 8.9–10.3)
Chloride: 109 mmol/L (ref 98–111)
Creatinine, Ser: 1.62 mg/dL — ABNORMAL HIGH (ref 0.44–1.00)
GFR calc Af Amer: 38 mL/min — ABNORMAL LOW (ref >60)
GFR calc non Af Amer: 33 mL/min — ABNORMAL LOW (ref >60)
Glucose, Bld: 114 mg/dL — ABNORMAL HIGH (ref 70–99)
Potassium: 3.2 mmol/L — ABNORMAL LOW (ref 3.5–5.1)
Sodium: 141 mmol/L (ref 135–145)
Total Bilirubin: 0.5 mg/dL (ref 0.3–1.2)
Total Protein: 6.9 g/dL (ref 6.5–8.1)

## 2017-10-26 LAB — CBC WITH DIFFERENTIAL/PLATELET
Basophils Absolute: 0 10*3/uL (ref 0.0–0.1)
Basophils Relative: 0 %
Eosinophils Absolute: 0.1 10*3/uL (ref 0.0–0.7)
Eosinophils Relative: 1 %
HCT: 42.5 % (ref 36.0–46.0)
Hemoglobin: 14.1 g/dL (ref 12.0–15.0)
Lymphocytes Relative: 47 %
Lymphs Abs: 4.6 10*3/uL — ABNORMAL HIGH (ref 0.7–4.0)
MCH: 32.4 pg (ref 26.0–34.0)
MCHC: 33.2 g/dL (ref 30.0–36.0)
MCV: 97.7 fL (ref 78.0–100.0)
Monocytes Absolute: 0.8 10*3/uL (ref 0.1–1.0)
Monocytes Relative: 8 %
Neutro Abs: 4.3 10*3/uL (ref 1.7–7.7)
Neutrophils Relative %: 44 %
Platelets: 393 10*3/uL (ref 150–400)
RBC: 4.35 MIL/uL (ref 3.87–5.11)
RDW: 15.9 % — ABNORMAL HIGH (ref 11.5–15.5)
WBC: 9.8 10*3/uL (ref 4.0–10.5)

## 2017-10-26 LAB — PROTIME-INR
INR: 0.99
Prothrombin Time: 13 seconds (ref 11.4–15.2)

## 2017-10-26 LAB — LIPID PANEL
Cholesterol: 159 mg/dL (ref 0–200)
HDL: 65 mg/dL (ref >40)
LDL Cholesterol: 59 mg/dL (ref 0–99)
Total CHOL/HDL Ratio: 2.4 RATIO
Triglycerides: 174 mg/dL — ABNORMAL HIGH (ref <150)
VLDL: 35 mg/dL (ref 0–40)

## 2017-10-26 LAB — TROPONIN I
Troponin I: 0.07 ng/mL (ref <0.03)
Troponin I: 1.62 ng/mL (ref <0.03)

## 2017-10-26 LAB — APTT: aPTT: 25 seconds (ref 24–36)

## 2017-10-26 SURGERY — CORONARY/GRAFT ACUTE MI REVASCULARIZATION
Anesthesia: LOCAL

## 2017-10-26 MED ORDER — IOPAMIDOL (ISOVUE-370) INJECTION 76%
INTRAVENOUS | Status: AC
  Filled 2017-10-26: qty 125

## 2017-10-26 MED ORDER — TIROFIBAN HCL IV 12.5 MG/250 ML
INTRAVENOUS | Status: AC
  Filled 2017-10-26: qty 250

## 2017-10-26 MED ORDER — FAMOTIDINE 40 MG PO TABS
40.0000 mg | ORAL_TABLET | Freq: Every day | ORAL | Status: DC
  Administered 2017-10-26 – 2017-10-27 (×2): 40 mg via ORAL
  Filled 2017-10-26: qty 1
  Filled 2017-10-26 (×2): qty 2
  Filled 2017-10-26: qty 1

## 2017-10-26 MED ORDER — ASPIRIN 81 MG PO CHEW: 324.0000 mg | CHEWABLE_TABLET | Freq: Once | ORAL | Status: DC

## 2017-10-26 MED ORDER — LABETALOL HCL 5 MG/ML IV SOLN: 10.0000 mg | INTRAVENOUS | Status: AC | PRN

## 2017-10-26 MED ORDER — SODIUM CHLORIDE 0.9 % IV SOLN
INTRAVENOUS | Status: DC
  Administered 2017-10-26 – 2017-10-27 (×3): via INTRAVENOUS

## 2017-10-26 MED ORDER — FENTANYL CITRATE (PF) 100 MCG/2ML IJ SOLN
INTRAMUSCULAR | Status: AC
  Filled 2017-10-26: qty 2

## 2017-10-26 MED ORDER — BIVALIRUDIN TRIFLUOROACETATE 250 MG IV SOLR
INTRAVENOUS | Status: AC
  Filled 2017-10-26: qty 250

## 2017-10-26 MED ORDER — TICAGRELOR 90 MG PO TABS
ORAL_TABLET | ORAL | Status: DC | PRN
  Administered 2017-10-26: 180 mg via ORAL

## 2017-10-26 MED ORDER — TIROFIBAN HCL IV 12.5 MG/250 ML
INTRAVENOUS | Status: AC | PRN
  Administered 2017-10-26: 0.15 ug/kg/min via INTRAVENOUS

## 2017-10-26 MED ORDER — HYDRALAZINE HCL 20 MG/ML IJ SOLN: 5.0000 mg | INTRAMUSCULAR | Status: AC | PRN

## 2017-10-26 MED ORDER — ACETAMINOPHEN 325 MG PO TABS: 650.0000 mg | ORAL_TABLET | ORAL | Status: DC | PRN

## 2017-10-26 MED ORDER — TICAGRELOR 90 MG PO TABS
90.0000 mg | ORAL_TABLET | Freq: Two times a day (BID) | ORAL | Status: DC
  Administered 2017-10-27 – 2017-10-28 (×3): 90 mg via ORAL
  Filled 2017-10-26 (×3): qty 1

## 2017-10-26 MED ORDER — ONDANSETRON HCL 4 MG/2ML IJ SOLN: 4.0000 mg | Freq: Four times a day (QID) | INTRAMUSCULAR | Status: DC | PRN

## 2017-10-26 MED ORDER — HEPARIN (PORCINE) IN NACL 1000-0.9 UT/500ML-% IV SOLN
INTRAVENOUS | Status: DC | PRN
  Administered 2017-10-26: 500 mL

## 2017-10-26 MED ORDER — SODIUM CHLORIDE 0.9% FLUSH
3.0000 mL | Freq: Two times a day (BID) | INTRAVENOUS | Status: DC
  Administered 2017-10-27 – 2017-10-28 (×4): 3 mL via INTRAVENOUS

## 2017-10-26 MED ORDER — IOPAMIDOL (ISOVUE-370) INJECTION 76%
INTRAVENOUS | Status: DC | PRN
  Administered 2017-10-26: 115 mL via INTRA_ARTERIAL

## 2017-10-26 MED ORDER — LIDOCAINE HCL (PF) 1 % IJ SOLN
INTRAMUSCULAR | Status: DC | PRN
  Administered 2017-10-26: 15 mL

## 2017-10-26 MED ORDER — BIVALIRUDIN BOLUS VIA INFUSION - CUPID
INTRAVENOUS | Status: DC | PRN
  Administered 2017-10-26: 52.725 mg via INTRAVENOUS

## 2017-10-26 MED ORDER — HEPARIN (PORCINE) IN NACL 100-0.45 UNIT/ML-% IJ SOLN
850.0000 [IU]/h | INTRAMUSCULAR | Status: DC
  Filled 2017-10-26: qty 250

## 2017-10-26 MED ORDER — NITROGLYCERIN 0.4 MG SL SUBL: 0.4000 mg | SUBLINGUAL_TABLET | SUBLINGUAL | Status: DC | PRN

## 2017-10-26 MED ORDER — SODIUM CHLORIDE 0.9% FLUSH: 3.0000 mL | INTRAVENOUS | Status: DC | PRN

## 2017-10-26 MED ORDER — FENTANYL CITRATE (PF) 100 MCG/2ML IJ SOLN: 50.0000 ug | Freq: Once | INTRAMUSCULAR | Status: DC

## 2017-10-26 MED ORDER — DIAZEPAM 5 MG PO TABS
5.0000 mg | ORAL_TABLET | Freq: Four times a day (QID) | ORAL | Status: DC | PRN
  Administered 2017-10-26 – 2017-10-28 (×3): 5 mg via ORAL
  Filled 2017-10-26 (×3): qty 1

## 2017-10-26 MED ORDER — HEPARIN (PORCINE) IN NACL 1000-0.9 UT/500ML-% IV SOLN
INTRAVENOUS | Status: AC
  Filled 2017-10-26: qty 1000

## 2017-10-26 MED ORDER — TICAGRELOR 90 MG PO TABS
ORAL_TABLET | ORAL | Status: AC
  Filled 2017-10-26: qty 2

## 2017-10-26 MED ORDER — MIDAZOLAM HCL 2 MG/2ML IJ SOLN
INTRAMUSCULAR | Status: DC | PRN
  Administered 2017-10-26: 1 mg via INTRAVENOUS

## 2017-10-26 MED ORDER — FENTANYL CITRATE (PF) 100 MCG/2ML IJ SOLN
INTRAMUSCULAR | Status: DC | PRN
  Administered 2017-10-26: 25 ug via INTRAVENOUS

## 2017-10-26 MED ORDER — MIDAZOLAM HCL 2 MG/2ML IJ SOLN
INTRAMUSCULAR | Status: AC
  Filled 2017-10-26: qty 2

## 2017-10-26 MED ORDER — LIDOCAINE HCL (PF) 1 % IJ SOLN
INTRAMUSCULAR | Status: AC
  Filled 2017-10-26: qty 30

## 2017-10-26 MED ORDER — SODIUM CHLORIDE 0.9 % IV SOLN
INTRAVENOUS | Status: DC
  Administered 2017-10-26: 250 mL via INTRAVENOUS

## 2017-10-26 MED ORDER — IOHEXOL 350 MG/ML SOLN
INTRAVENOUS | Status: DC | PRN
  Administered 2017-10-26: 10 mL via INTRA_ARTERIAL

## 2017-10-26 MED ORDER — TIROFIBAN HCL IV 12.5 MG/250 ML
0.0750 ug/kg/min | INTRAVENOUS | Status: AC
  Administered 2017-10-26: 0.075 ug/kg/min via INTRAVENOUS

## 2017-10-26 MED ORDER — ASPIRIN EC 81 MG PO TBEC
81.0000 mg | DELAYED_RELEASE_TABLET | Freq: Every day | ORAL | Status: DC
  Administered 2017-10-27 – 2017-10-28 (×2): 81 mg via ORAL
  Filled 2017-10-26 (×2): qty 1

## 2017-10-26 MED ORDER — SODIUM CHLORIDE 0.9 % IV SOLN
250.0000 mL | INTRAVENOUS | Status: DC | PRN
  Administered 2017-10-27: 250 mL via INTRAVENOUS

## 2017-10-26 MED ORDER — TIROFIBAN (AGGRASTAT) BOLUS VIA INFUSION
INTRAVENOUS | Status: DC | PRN
  Administered 2017-10-26: 1757.5 ug via INTRAVENOUS

## 2017-10-26 MED ORDER — SODIUM CHLORIDE 0.9 % IV SOLN
INTRAVENOUS | Status: AC | PRN
  Administered 2017-10-26: 1.75 mg/kg/h via INTRAVENOUS

## 2017-10-26 MED ORDER — ATORVASTATIN CALCIUM 80 MG PO TABS
80.0000 mg | ORAL_TABLET | Freq: Every day | ORAL | Status: DC
  Administered 2017-10-27: 80 mg via ORAL
  Filled 2017-10-26: qty 1

## 2017-10-26 MED ORDER — AZATHIOPRINE 75 MG PO TABS
187.5000 mg | ORAL_TABLET | Freq: Every day | ORAL | Status: DC
  Filled 2017-10-26: qty 3

## 2017-10-26 MED ORDER — ASPIRIN 81 MG PO CHEW: 81.0000 mg | CHEWABLE_TABLET | Freq: Every day | ORAL | Status: DC

## 2017-10-26 MED ORDER — HEPARIN SODIUM (PORCINE) 5000 UNIT/ML IJ SOLN: 4000.0000 [IU] | Freq: Once | INTRAMUSCULAR | Status: DC

## 2017-10-26 MED ORDER — SODIUM CHLORIDE 0.9 % IV BOLUS: 500.0000 mL | Freq: Once | INTRAVENOUS | Status: DC

## 2017-10-26 SURGICAL SUPPLY — 18 items
BALLN SAPPHIRE 2.0X12 (BALLOONS) ×2
BALLN SAPPHIRE ~~LOC~~ 3.25X12 (BALLOONS) ×2 IMPLANT
BALLOON SAPPHIRE 2.0X12 (BALLOONS) ×1 IMPLANT
CATH INFINITI 5FR MULTPACK ANG (CATHETERS) ×2 IMPLANT
CATH VISTA GUIDE 6FR JR4 (CATHETERS) ×2 IMPLANT
GLIDESHEATH SLEND A-KIT 6F 22G (SHEATH) ×2 IMPLANT
GUIDEWIRE INQWIRE 1.5J.035X260 (WIRE) ×1 IMPLANT
INQWIRE 1.5J .035X260CM (WIRE) ×2
KIT ENCORE 26 ADVANTAGE (KITS) ×2 IMPLANT
KIT HEART LEFT (KITS) ×2 IMPLANT
PACK CARDIAC CATHETERIZATION (CUSTOM PROCEDURE TRAY) ×2 IMPLANT
PAD ELECT DEFIB RADIOL ZOLL (MISCELLANEOUS) ×2 IMPLANT
SHEATH PINNACLE 6F 10CM (SHEATH) ×2 IMPLANT
STENT SYNERGY DES 3X16 (Permanent Stent) ×2 IMPLANT
TRANSDUCER W/STOPCOCK (MISCELLANEOUS) ×2 IMPLANT
TUBING CIL FLEX 10 FLL-RA (TUBING) ×2 IMPLANT
WIRE EMERALD 3MM-J .035X150CM (WIRE) ×2 IMPLANT
WIRE PT2 MS 185 (WIRE) ×2 IMPLANT

## 2017-10-26 NOTE — ED Triage Notes (Signed)
BIB EMS from home as Code STEMI. Pt had onset of central CP 1PM. Non radiating, pressure in nature, 10/10. Hypotensive with EMS. 324 ASA given en route.

## 2017-10-26 NOTE — Progress Notes (Signed)
ANTICOAGULATION CONSULT NOTE - Initial Consult  Pharmacy Consult for heparin Indication: chest pain/ACS   Patient Measurements: Heparin Dosing Weight: 69.9 kg  Vital Signs: Temp: 96.4 F (35.8 C) (07/16 1944) Temp Source: Temporal (07/16 1944) BP: 72/44 (07/16 1944) Pulse Rate: 72 (07/16 1944)  Labs: No results for input(s): HGB, HCT, PLT, APTT, LABPROT, INR, HEPARINUNFRC, HEPRLOWMOCWT, CREATININE, CKTOTAL, CKMB, TROPONINI in the last 72 hours.   Medical History: Past Medical History:  Diagnosis Date  . Crohn's colitis [facility])      Assessment: 62 yo female with chest pain. She received aspirin on her way to the hospital. She also received heparin 4000 units x1 in the ED. Labs from ~ 6 months ago reveal some kidney dysfunction at baseline, CBC at that time was WNL.   Goal of Therapy:  Heparin level 0.3-0.7 units/ml Monitor platelets by anticoagulation protocol: Yes   Plan:  -Heparin at 850 units/hr -Daily HL, CBC -F/u plans for heparin after cath   Harvel Quale 10/26/2017,7:56 PM

## 2017-10-26 NOTE — ED Provider Notes (Signed)
[facility] [facility] CATH LAB Provider Note   CSN: [id number] Arrival date & time: 10/26/17  1940     History   Chief Complaint Chief Complaint  Patient presents with  . Code STEMI    HPI Anne [name] is a 62 y.o. female.  HPI Patient presented to the emergency room for evaluation of chest pain.  Patient states her symptoms started about 1 PM.  Is having constant nonradiating pressure in her chest.  Symptoms have been increasing in severity and now are 10 out of 10.  Patient is felt nauseated as well as short of breath.  She was diaphoretic.  She does not have any history of heart disease.  She does smoke.  EMS was called and they noted that her EKG was consistent with an ST elevation myocardial infarction.  Code STEMI was activated in the field.  Patient was noted to be hypotensive so nitroglycerin was withheld. Past Medical History:  Diagnosis Date  . Crohn's colitis [facility])     Patient Active Problem List   Diagnosis Date Noted  . STEMI (ST elevation myocardial infarction) ([place]) 10/26/2017    Past Surgical History:  Procedure Laterality Date  . SMALL INTESTINE SURGERY       OB History   None      Home Medications    Prior to Admission medications   Medication Sig Start Date End Date Taking? Authorizing Provider  acetaminophen-codeine (TYLENOL #4) 300-60 MG tablet Take 1 tablet by mouth every 6 (six) hours as needed for moderate pain. 10/15/17   Jacqlyn Larsen, PA-C  alendronate (FOSAMAX) 70 MG tablet Take 70 mg by mouth once a week. Take with a full glass of water on an empty stomach.    [provider]  AZASAN 75 MG TABS Take 187.5 mg by mouth daily.  01/25/17   [provider]  calcium carbonate (OSCAL) 1500 (600 Ca) MG TABS tablet Take 600 mg of elemental calcium 2 (two) times daily with a meal by mouth.    [provider]  cephALEXin (KEFLEX) 500 MG capsule Take 1 capsule (500 mg total) by mouth 3 (three) times daily.  06/27/17   Virgel Manifold, MD  ciprofloxacin (CIPRO) 500 MG tablet Take 500 mg by mouth every 12 (twelve) hours.    [provider]  diphenoxylate-atropine (LOMOTIL) 2.5-0.025 MG tablet Take 1 tablet by mouth daily.    [provider]  famotidine (PEPCID) 40 MG tablet Take 40 mg at bedtime by mouth. 01/14/17   [provider]  methocarbamol (ROBAXIN) 500 MG tablet Take 1 tablet (500 mg total) by mouth 2 (two) times daily. 10/15/17   Jacqlyn Larsen, PA-C  methylPREDNISolone (MEDROL DOSEPAK) 4 MG TBPK tablet Take as directed 10/15/17   Jacqlyn Larsen, PA-C  potassium chloride SA (K-DUR,KLOR-CON) 20 MEQ tablet Take 1 tablet (20 mEq total) by mouth daily. 06/27/17   Virgel Manifold, MD  vitamin E 100 UNIT capsule Take 100 Units daily by mouth.    [provider]    Family History No family history on file.  Social History Social History   Tobacco Use  . Smoking status: Current Some Day Smoker    Packs/day: 0.50  . Smokeless tobacco: Never Used  Substance Use Topics  . Alcohol use: No    Frequency: Never  . Drug use: No     Allergies   Patient has no known allergies.   Review of Systems Review of Systems  All  other systems reviewed and are negative.    Physical Exam Updated Vital Signs BP (!) 72/44 (BP Location: Left Arm)   Pulse 72   Temp (!) 96.4 F (35.8 C) (Temporal)   Resp (!) 21   Ht 1.753 m (5' 9" )   Wt 70.3 kg (155 lb)   LMP 02/18/2001   SpO2 97%   BMI 22.89 kg/m   Physical Exam  Constitutional: She appears distressed.  HENT:  Head: Normocephalic and atraumatic.  Right Ear: External ear normal.  Left Ear: External ear normal.  Eyes: Conjunctivae are normal. Right eye exhibits no discharge. Left eye exhibits no discharge. No scleral icterus.  Neck: Neck supple. No tracheal deviation present.  Cardiovascular: Normal rate, regular rhythm and intact distal pulses.  Pulmonary/Chest: Effort normal and breath sounds normal. No  stridor. No respiratory distress. She has no wheezes. She has no rales.  Abdominal: Soft. Bowel sounds are normal. She exhibits no distension. There is no tenderness. There is no rebound and no guarding.  Musculoskeletal: She exhibits no edema or tenderness.  Neurological: She is alert. She has normal strength. No cranial nerve deficit (no facial droop, extraocular movements intact, no slurred speech) or sensory deficit. She exhibits normal muscle tone. She displays no seizure activity. Coordination normal.  Skin: Skin is warm and dry. No rash noted.  Psychiatric: She has a normal mood and affect.  Nursing note and vitals reviewed.    ED Treatments / Results  Labs (all labs ordered are listed, but only abnormal results are displayed) Labs Reviewed  CBC WITH DIFFERENTIAL/PLATELET - Abnormal; Notable for the following components:      Result Value   RDW 15.9 (*)    All other components within normal limits  LIPID PANEL - Abnormal; Notable for the following components:   Triglycerides 174 (*)    All other components within normal limits  PROTIME-INR  APTT  COMPREHENSIVE METABOLIC PANEL  TROPONIN I  HIV ANTIBODY (ROUTINE TESTING)  HEMOGLOBIN A1C  TSH  MAGNESIUM  LIPID PANEL  BASIC METABOLIC PANEL  CBC    EKG EKG Interpretation  Date/Time:  Tuesday October 26 2017 19:42:32 EDT Ventricular Rate:  70 PR Interval:    QRS Duration: 94 QT Interval:  429 QTC Calculation: 463 R Axis:   24 Text Interpretation:  Sinus rhythm Borderline short PR interval Low voltage, precordial leads Repol abnrm suggests ischemia, anterolateral ST elevation, consider inferior injury ** ** ACUTE MI / STEMI ** ** Confirmed by Dorie Rank [id number]) on 10/26/2017 9:04:30 PM   Radiology Dg Chest Port 1 View  Result Date: 10/26/2017 CLINICAL DATA:  Chest pain EXAM: PORTABLE CHEST 1 VIEW COMPARISON:  04/28/2013 FINDINGS: Lungs are now clear. Interval clearing of right upper lobe infiltrate. Negative for heart  failure or effusion. Cervical spine fusion. IMPRESSION: No active disease. Electronically Signed   By: Franchot Gallo M.D.   On: 10/26/2017 20:05    Procedures Procedures (including critical care time)  Medications Ordered in ED Medications  heparin ADULT infusion 100 units/mL (25000 units/285m sodium chloride 0.45%) (has no administration in time range)  lidocaine (PF) (XYLOCAINE) 1 % injection (15 mLs Infiltration Given 10/26/17 2006)  midazolam (VERSED) injection (1 mg Intravenous Given 10/26/17 2006)  fentaNYL (SUBLIMAZE) injection (25 mcg Intravenous Given 10/26/17 2006)  bivalirudin (ANGIOMAX) BOLUS via infusion (52.725 mg Intravenous Given 10/26/17 2016)  bivalirudin (ANGIOMAX) 250 mg in sodium chloride 0.9 % 50 mL (5 mg/mL) infusion (1.75 mg/kg/hr  70.3 kg Intravenous New Bag/Given 10/26/17 2018)  ticagrelor (BRILINTA) tablet (180 mg Oral Given 10/26/17 2018)  tirofiban (AGGRASTAT) bolus via infusion (1,757.5 mcg Intravenous Given 10/26/17 2021)  tirofiban (AGGRASTAT) infusion 50 mcg/mL 250 mL (  Rate/Dose Change 10/26/17 2055)  iopamidol (ISOVUE-370) 76 % injection (115 mLs Intra-arterial Given 10/26/17 2100)  iohexol (OMNIPAQUE) 350 MG/ML injection (10 mLs Intra-arterial Given 10/26/17 2100)  Azathioprine TABS 187.5 mg (has no administration in time range)  famotidine (PEPCID) tablet 40 mg (has no administration in time range)  aspirin EC tablet 81 mg (has no administration in time range)  nitroGLYCERIN (NITROSTAT) SL tablet 0.4 mg (has no administration in time range)  acetaminophen (TYLENOL) tablet 650 mg (has no administration in time range)  ondansetron (ZOFRAN) injection 4 mg (has no administration in time range)  atorvastatin (LIPITOR) tablet 80 mg (has no administration in time range)     Initial Impression / Assessment and Plan / ED Course  I have reviewed the triage vital signs and the nursing notes.  Pertinent labs & imaging results that were available during my care of  the patient were reviewed by me and considered in my medical decision making (see chart for details).    Patient presented to the emergency room for evaluation of a ST elevation myocardial infarction.  Cardiac Cath Lab was activated.  Patient was started on heparin.  She was given a dose of fentanyl for pain.  She was transported emergently up to the cardiac catheterization lab.  Final Clinical Impressions(s) / ED Diagnoses   Final diagnoses:  Acute ST elevation myocardial infarction (STEMI), unspecified artery [facility])    ED Discharge Orders    None       Dorie Rank, MD 10/26/17 2106

## 2017-10-26 NOTE — H&P (Addendum)
Cardiology Admission History and Physical:   Patient ID: Anne [name]; MRN: [id number]; DOB: 1956-04-03   Admission date: 10/26/2017  Primary Care Provider: Patient, No Pcp Per Primary Cardiologist: New to [place] Primary Electrophysiologist:  None  Chief Complaint:  Chest pain  Patient Profile:   Anne [name] is a 62 y.o. female with a history of Crohn's disease, who presented with sudden onset chest pain. Admitted to cardiology for STEMI.  History of Present Illness:   Anne [name] was in her usual state of health until this afternoon at 1pm when she began experiencing central/left-sided chest pain. She reported associated SOB, diaphoresis, and nausea. Her symptoms worsened throughout the day prompting her to activate EMS. She was found to have ST elevations in inferior leads and was brought to the hospital for evaluation of her STEMI.  She denies prior cardiac history or significant family history of heart disease. She has never had an ischemic evaluation.   At the time of this evaluation she is still experiencing 10/10 chest pain. She was taken emergently to the cath lab for cardiac catheterization.   On arrival she was hypotensive to 72/44, improved overall but still with soft BP's; she has been persistently tachycardic since being taken to the cath lab; otherwise VSS.  Labs are pending.    Past Medical History:  Diagnosis Date  . Crohn's colitis [facility])     Past Surgical History:  Procedure Laterality Date  . SMALL INTESTINE SURGERY       Medications Prior to Admission: Prior to Admission medications   Medication Sig Start Date End Date Taking? Authorizing Provider  acetaminophen-codeine (TYLENOL #4) 300-60 MG tablet Take 1 tablet by mouth every 6 (six) hours as needed for moderate pain. 10/15/17   Jacqlyn Larsen, PA-C  alendronate (FOSAMAX) 70 MG tablet Take 70 mg by mouth once a week. Take with a full glass of water on an empty stomach.    [provider]    AZASAN 75 MG TABS Take 187.5 mg by mouth daily.  01/25/17   [provider]  calcium carbonate (OSCAL) 1500 (600 Ca) MG TABS tablet Take 600 mg of elemental calcium 2 (two) times daily with a meal by mouth.    [provider]  cephALEXin (KEFLEX) 500 MG capsule Take 1 capsule (500 mg total) by mouth 3 (three) times daily. 06/27/17   Virgel Manifold, MD  ciprofloxacin (CIPRO) 500 MG tablet Take 500 mg by mouth every 12 (twelve) hours.    [provider]  diphenoxylate-atropine (LOMOTIL) 2.5-0.025 MG tablet Take 1 tablet by mouth daily.    [provider]  famotidine (PEPCID) 40 MG tablet Take 40 mg at bedtime by mouth. 01/14/17   [provider]  methocarbamol (ROBAXIN) 500 MG tablet Take 1 tablet (500 mg total) by mouth 2 (two) times daily. 10/15/17   Jacqlyn Larsen, PA-C  methylPREDNISolone (MEDROL DOSEPAK) 4 MG TBPK tablet Take as directed 10/15/17   Jacqlyn Larsen, PA-C  potassium chloride SA (K-DUR,KLOR-CON) 20 MEQ tablet Take 1 tablet (20 mEq total) by mouth daily. 06/27/17   Virgel Manifold, MD  vitamin E 100 UNIT capsule Take 100 Units daily by mouth.    [provider]     Allergies:   No Known Allergies  Social History:   Social History   Socioeconomic History  . Marital status: Single    Spouse name: Not on file  . Number of children: Not on file  . Years of  education: Not on file  . Highest education level: Not on file  Occupational History  . Not on file  Social Needs  . Financial resource strain: Not on file  . Food insecurity:    Worry: Not on file    Inability: Not on file  . Transportation needs:    Medical: Not on file    Non-medical: Not on file  Tobacco Use  . Smoking status: Current Some Day Smoker    Packs/day: 0.50  . Smokeless tobacco: Never Used  Substance and Sexual Activity  . Alcohol use: No    Frequency: Never  . Drug use: No  . Sexual activity: Not on file  Lifestyle  . Physical activity:    Days  per week: Not on file    Minutes per session: Not on file  . Stress: Not on file  Relationships  . Social connections:    Talks on phone: Not on file    Gets together: Not on file    Attends religious service: Not on file    Active member of club or organization: Not on file    Attends meetings of clubs or organizations: Not on file    Relationship status: Not on file  . Intimate partner violence:    Fear of current or ex partner: Not on file    Emotionally abused: Not on file    Physically abused: Not on file    Forced sexual activity: Not on file  Other Topics Concern  . Not on file  Social History Narrative  . Not on file    Family History:  No family history of CAD The patient's family history is not on file.    ROS:  Please see the history of present illness.  All other ROS reviewed and negative.     Physical Exam/Data:   Vitals:   10/26/17 1944 10/26/17 2007  BP: (!) 72/44   Pulse: 72   Resp: (!) 21   Temp: (!) 96.4 F (35.8 C)   TempSrc: Temporal   SpO2: 97%   Weight:  155 lb (70.3 kg)  Height:  5' 9"  (1.753 m)   No intake or output data in the 24 hours ending 10/26/17 2102 Filed Weights   10/26/17 2007  Weight: 155 lb (70.3 kg)   Body mass index is 22.89 kg/m.  General:  AAF, laying on the stretcher grimacing from chest pain HEENT: sclera anicteric Neck: no JVD Vascular: No carotid bruits; distal pulses 2+ bilaterally Cardiac:  normal S1, S2; RRR; no murmurs, gallops, or rubs Lungs:  clear to auscultation bilaterally, no wheezing, rhonchi or rales  Abd: soft, nontender, no hepatomegaly  Ext: no edema Musculoskeletal:  No deformities, BUE and BLE strength normal and equal Skin: warm and dry  Neuro:  CNs 2-12 intact, no focal abnormalities noted Psych:  Normal affect    EKG:  Sinus rhythm with STE in inferior leads   Relevant CV Studies: LHC and echo pending  Laboratory Data:  ChemistryNo results for input(s): NA, K, CL, CO2, GLUCOSE, BUN,  CREATININE, CALCIUM, GFRNONAA, GFRAA, ANIONGAP in the last 168 hours.  No results for input(s): PROT, ALBUMIN, AST, ALT, ALKPHOS, BILITOT in the last 168 hours. Hematology Recent Labs  Lab 10/26/17 1947  WBC 9.8  RBC 4.35  HGB 14.1  HCT 42.5  MCV 97.7  MCH 32.4  MCHC 33.2  RDW 15.9*  PLT 393   Cardiac EnzymesNo results for input(s): TROPONINI in the last 168 hours. No results  for input(s): TROPIPOC in the last 168 hours.  BNPNo results for input(s): BNP, PROBNP in the last 168 hours.  DDimer No results for input(s): DDIMER in the last 168 hours.  Radiology/Studies:  Dg Chest Port 1 View  Result Date: 10/26/2017 CLINICAL DATA:  Chest pain EXAM: PORTABLE CHEST 1 VIEW COMPARISON:  04/28/2013 FINDINGS: Lungs are now clear. Interval clearing of right upper lobe infiltrate. Negative for heart failure or effusion. Cervical spine fusion. IMPRESSION: No active disease. Electronically Signed   By: Franchot Gallo M.D.   On: 10/26/2017 20:05    Assessment and Plan:   1. STEMI: patient presented with sudden onset chest pain which started at 1pm this afternoon with associated SOB, diaphoresis, and nausea. EMS was activated and she was found to have STE in inferior leads. She was taken directly to the cath lab for emergent cardiac catheterization. - Started on aspirin and statin - Unable to add BBlocker due to tachycardia - DAPT per Dr. Claiborne Billings pending cath results - Will check HgbA1C and FLP for risk stratification - Will check an echocardiogram  2. Crohns disease: managed with azathioprine. No prior surgeries - Continue home azathioprine     Severity of Illness: The appropriate patient status for this patient is INPATIENT. Inpatient status is judged to be reasonable and necessary in order to provide the required intensity of service to ensure the patient's safety. The patient's presenting symptoms, physical exam findings, and initial radiographic and laboratory data in the context of their  chronic comorbidities is felt to place them at high risk for further clinical deterioration. Furthermore, it is not anticipated that the patient will be medically stable for discharge from the hospital within 2 midnights of admission. The following factors support the patient status of inpatient.   " The patient's presenting symptoms include chest pain. " The worrisome physical exam findings include chest pain. " The initial radiographic and laboratory data are worrisome because of ST elevations on EKG. " The chronic co-morbidities include crohns disease.   * I certify that at the point of admission it is my clinical judgment that the patient will require inpatient hospital care spanning beyond 2 midnights from the point of admission due to high intensity of service, high risk for further deterioration and high frequency of surveillance required.*    For questions or updates, please contact [place] Please consult [email or web address] for contact info under Cardiology/STEMI.    Signed, Abigail Butts, PA-C  10/26/2017 9:02 PM    Patient seen and examined. Agree with assessment and plan.  Ms. Karam is a 62 year old female who has a history of Crohn's disease.  She developed new onset chest pain which worsened throughout the day.  Her pain commenced at 1 PM became more severe was associated with shortness of breath diaphoresis and nausea.  EMS was activated.  An ECG revealed inferior ST elevation consistent with an inferior ST segment elevation MI.  Upon arrival to the hospital she was hypotensive and has had tachycardia with heart rate irregularity.  She was experiencing 10 out of 10 chest pain.  She is taken acutely to the catheterization laboratory.  I reviewed the catheterization procedure with the patient with probable need for PCI of an occluded RCA.    Troy Sine, MD, [facility] 10/26/2017 9:39 PM

## 2017-10-26 NOTE — ED Notes (Signed)
Pt belongings (clothing, purse, necklace, earrings, wedding band) given to pt son

## 2017-10-26 NOTE — Progress Notes (Addendum)
Pharmacy CONSULT NOTE   Pharmacy Consult for Aggrastat x 18 hrs post cath Indication: CAD s/p PCI   Patient Measurements: Heparin Dosing Weight: 69.9 kg  Vital Signs: Temp: 96.4 F (35.8 C) (07/16 1944) Temp Source: Temporal (07/16 1944) BP: 105/77 (07/16 2103) Pulse Rate: 134 (07/16 2103)  Labs: Recent Labs    10/26/17 1947  HGB 14.1  HCT 42.5  PLT 393  APTT 25  LABPROT 13.0  INR 0.99  CREATININE 1.62*  TROPONINI 0.07*     Medical History: Past Medical History:  Diagnosis Date  . Crohn's colitis [facility])      Assessment: 62 yo female s/p cath with PCI.  Aggrastat to continue x 18 hrs.   Goal of Therapy:  Monitor platelets by anticoagulation protocol: Yes   Plan:  -Aggrastat 0.075 mcg/kg/min x 18 hrs total. -CBC in 8 hrs.   Marguerite Olea, [facility] Clinical Pharmacist Phone [phone]  10/26/2017 9:44 PM  5

## 2017-10-27 ENCOUNTER — Inpatient Hospital Stay (HOSPITAL_COMMUNITY): Payer: BLUE CROSS/BLUE SHIELD

## 2017-10-27 ENCOUNTER — Encounter (HOSPITAL_COMMUNITY): Payer: Self-pay | Admitting: Cardiovascular Disease

## 2017-10-27 ENCOUNTER — Other Ambulatory Visit: Payer: Self-pay

## 2017-10-27 ENCOUNTER — Other Ambulatory Visit (HOSPITAL_COMMUNITY): Payer: PRIVATE HEALTH INSURANCE

## 2017-10-27 DIAGNOSIS — K5090 Crohn's disease, unspecified, without complications: Secondary | ICD-10-CM

## 2017-10-27 DIAGNOSIS — R079 Chest pain, unspecified: Secondary | ICD-10-CM

## 2017-10-27 DIAGNOSIS — E876 Hypokalemia: Secondary | ICD-10-CM

## 2017-10-27 LAB — BASIC METABOLIC PANEL
Anion gap: 7 (ref 5–15)
BUN: 14 mg/dL (ref 8–23)
CO2: 22 mmol/L (ref 22–32)
Calcium: 8.2 mg/dL — ABNORMAL LOW (ref 8.9–10.3)
Chloride: 112 mmol/L — ABNORMAL HIGH (ref 98–111)
Creatinine, Ser: 1.37 mg/dL — ABNORMAL HIGH (ref 0.44–1.00)
GFR calc Af Amer: 46 mL/min — ABNORMAL LOW (ref >60)
GFR calc non Af Amer: 40 mL/min — ABNORMAL LOW (ref >60)
Glucose, Bld: 102 mg/dL — ABNORMAL HIGH (ref 70–99)
Potassium: 3.3 mmol/L — ABNORMAL LOW (ref 3.5–5.1)
Sodium: 141 mmol/L (ref 135–145)

## 2017-10-27 LAB — URINALYSIS, ROUTINE W REFLEX MICROSCOPIC
Bilirubin Urine: NEGATIVE
Glucose, UA: NEGATIVE mg/dL
Ketones, ur: NEGATIVE mg/dL
Nitrite: NEGATIVE
Protein, ur: NEGATIVE mg/dL
Specific Gravity, Urine: 1.011 (ref 1.005–1.030)
pH: 7 (ref 5.0–8.0)

## 2017-10-27 LAB — POCT ACTIVATED CLOTTING TIME
Activated Clotting Time: 120 seconds
Activated Clotting Time: 153 seconds
Activated Clotting Time: 461 seconds
Activated Clotting Time: >1000 seconds

## 2017-10-27 LAB — CBC
HCT: 40.9 % (ref 36.0–46.0)
Hemoglobin: 13.5 g/dL (ref 12.0–15.0)
MCH: 32.2 pg (ref 26.0–34.0)
MCHC: 33 g/dL (ref 30.0–36.0)
MCV: 97.6 fL (ref 78.0–100.0)
Platelets: 372 10*3/uL (ref 150–400)
RBC: 4.19 MIL/uL (ref 3.87–5.11)
RDW: 15.8 % — ABNORMAL HIGH (ref 11.5–15.5)
WBC: 12.7 10*3/uL — ABNORMAL HIGH (ref 4.0–10.5)

## 2017-10-27 LAB — LIPID PANEL
Cholesterol: 132 mg/dL (ref 0–200)
HDL: 56 mg/dL (ref >40)
LDL Cholesterol: 57 mg/dL (ref 0–99)
Total CHOL/HDL Ratio: 2.4 RATIO
Triglycerides: 96 mg/dL (ref <150)
VLDL: 19 mg/dL (ref 0–40)

## 2017-10-27 LAB — TROPONIN I
Troponin I: 14.39 ng/mL (ref <0.03)
Troponin I: 12.63 ng/mL (ref <0.03)
Troponin I: 12.23 ng/mL (ref <0.03)

## 2017-10-27 LAB — ECHOCARDIOGRAM COMPLETE
Height: 69 in
Weight: 2518.54 oz

## 2017-10-27 LAB — HEMOGLOBIN A1C
Hgb A1c MFr Bld: 5.6 % (ref 4.8–5.6)
Mean Plasma Glucose: 114.02 mg/dL

## 2017-10-27 LAB — HIV ANTIBODY (ROUTINE TESTING W REFLEX): HIV Screen 4th Generation wRfx: NONREACTIVE

## 2017-10-27 LAB — POCT I-STAT, CHEM 8
BUN: 16 mg/dL (ref 8–23)
Calcium, Ion: 1.21 mmol/L (ref 1.15–1.40)
Chloride: 107 mmol/L (ref 98–111)
Creatinine, Ser: 1.2 mg/dL — ABNORMAL HIGH (ref 0.44–1.00)
Glucose, Bld: 104 mg/dL — ABNORMAL HIGH (ref 70–99)
HCT: 38 % (ref 36.0–46.0)
Hemoglobin: 12.9 g/dL (ref 12.0–15.0)
Potassium: 2.4 mmol/L — CL (ref 3.5–5.1)
Sodium: 142 mmol/L (ref 135–145)
TCO2: 20 mmol/L — ABNORMAL LOW (ref 22–32)

## 2017-10-27 LAB — MRSA PCR SCREENING: MRSA by PCR: NEGATIVE

## 2017-10-27 LAB — TSH: TSH: 0.76 u[IU]/mL (ref 0.350–4.500)

## 2017-10-27 LAB — PLATELET COUNT: Platelets: 380 10*3/uL (ref 150–400)

## 2017-10-27 LAB — MAGNESIUM: Magnesium: 1.6 mg/dL — ABNORMAL LOW (ref 1.7–2.4)

## 2017-10-27 MED ORDER — POTASSIUM CHLORIDE CRYS ER 20 MEQ PO TBCR
40.0000 meq | EXTENDED_RELEASE_TABLET | Freq: Once | ORAL | Status: AC
  Administered 2017-10-27: 40 meq via ORAL
  Filled 2017-10-27: qty 2

## 2017-10-27 MED ORDER — PNEUMOCOCCAL VAC POLYVALENT 25 MCG/0.5ML IJ INJ
0.5000 mL | INJECTION | INTRAMUSCULAR | Status: AC
Start: 2017-10-28 — End: 2017-10-28
  Administered 2017-10-28: 0.5 mL via INTRAMUSCULAR
  Filled 2017-10-27: qty 0.5

## 2017-10-27 MED ORDER — AZATHIOPRINE 50 MG PO TABS
200.0000 mg | ORAL_TABLET | Freq: Every day | ORAL | Status: DC
  Administered 2017-10-27 – 2017-10-28 (×2): 200 mg via ORAL
  Filled 2017-10-27 (×2): qty 4

## 2017-10-27 MED ORDER — ACETAMINOPHEN-CODEINE #3 300-30 MG PO TABS
1.0000 | ORAL_TABLET | Freq: Three times a day (TID) | ORAL | Status: DC | PRN
  Administered 2017-10-27 (×2): 1 via ORAL
  Filled 2017-10-27 (×2): qty 1

## 2017-10-27 MED ORDER — ACETAMINOPHEN-CODEINE #4 300-60 MG PO TABS: 1.0000 | ORAL_TABLET | Freq: Three times a day (TID) | ORAL | Status: DC | PRN

## 2017-10-27 MED ORDER — MAGNESIUM SULFATE 2 GM/50ML IV SOLN
2.0000 g | Freq: Once | INTRAVENOUS | Status: AC
  Administered 2017-10-27: 2 g via INTRAVENOUS
  Filled 2017-10-27: qty 50

## 2017-10-27 MED ORDER — METOPROLOL TARTRATE 12.5 MG HALF TABLET
12.5000 mg | ORAL_TABLET | Freq: Two times a day (BID) | ORAL | Status: DC
  Administered 2017-10-27 (×2): 12.5 mg via ORAL
  Filled 2017-10-27 (×3): qty 1

## 2017-10-27 NOTE — Progress Notes (Addendum)
Progress Note  Patient Name: Anne [name] Date of Encounter: 10/27/2017  Primary Cardiologist: No primary care provider on file.  Subjective   No chest pain. C/o chronic right leg pain.   Inpatient Medications    Scheduled Meds: . aspirin EC  81 mg Oral Daily  . atorvastatin  80 mg Oral q1800  . Azathioprine  187.5 mg Oral Daily  . famotidine  40 mg Oral QHS  . [START ON 10/28/2017] pneumococcal 23 valent vaccine  0.5 mL Intramuscular Tomorrow-1000  . sodium chloride flush  3 mL Intravenous Q12H  . ticagrelor  90 mg Oral BID   Continuous Infusions: . sodium chloride 200 mL/hr at 10/27/17 0916  . sodium chloride    . tirofiban 0.075 mcg/kg/min (10/27/17 0300)   PRN Meds: sodium chloride, acetaminophen, diazepam, nitroGLYCERIN, ondansetron (ZOFRAN) IV, sodium chloride flush   Vital Signs    Vitals:   10/27/17 0645 10/27/17 0700 10/27/17 0715 10/27/17 0746  BP: 120/80 118/83 123/86   Pulse: 84 84 84   Resp: 13 15 (!) 9   Temp:    98.3 F (36.8 C)  TempSrc:    Oral  SpO2: 100% 100% 100%   Weight:      Height:        Intake/Output Summary (Last 24 hours) at 10/27/2017 1010 Last data filed at 10/27/2017 0749 Gross per 24 hour  Intake 238.43 ml  Output 850 ml  Net -611.57 ml   Filed Weights   10/26/17 2007 10/27/17 0500  Weight: 155 lb (70.3 kg) 157 lb 6.5 oz (71.4 kg)    Telemetry    SR - Personally Reviewed  ECG    SR - Personally Reviewed  Physical Exam   General: Well developed, well nourished, AA female appearing in no acute distress. Head: Normocephalic, atraumatic.  Neck: Supple, no JVD. Lungs:  Resp regular and unlabored, CTA. Heart: RRR, S1, S2, no murmur; no rub. Abdomen: Soft, non-tender, non-distended with normoactive bowel sounds. Extremities: No clubbing, cyanosis, edema. Distal pedal pulses are 2+ bilaterally. Right femoral cath site stable.  Neuro: Alert and oriented X 3. Moves all extremities spontaneously. Psych: Normal  affect.  Labs    Chemistry Recent Labs  Lab 10/26/17 1947 10/27/17 0029  NA 141 141  K 3.2* 3.3*  CL 109 112*  CO2 22 22  GLUCOSE 114* 102*  BUN 16 14  CREATININE 1.62* 1.37*  CALCIUM 9.1 8.2*  PROT 6.9  --   ALBUMIN 3.6  --   AST 20  --   ALT 20  --   ALKPHOS 80  --   BILITOT 0.5  --   GFRNONAA 33* 40*  GFRAA 38* 46*  ANIONGAP 10 7     Hematology Recent Labs  Lab 10/26/17 1947 10/27/17 0029  WBC 9.8 12.7*  RBC 4.35 4.19  HGB 14.1 13.5  HCT 42.5 40.9  MCV 97.7 97.6  MCH 32.4 32.2  MCHC 33.2 33.0  RDW 15.9* 15.8*  PLT 393 380  372    Cardiac Enzymes Recent Labs  Lab 10/26/17 1947 10/26/17 2231 10/27/17 0831  TROPONINI 0.07* 1.62* 14.39*   No results for input(s): TROPIPOC in the last 168 hours.   BNPNo results for input(s): BNP, PROBNP in the last 168 hours.   DDimer No results for input(s): DDIMER in the last 168 hours.    Radiology    Dg Chest Port 1 View  Result Date: 10/26/2017 CLINICAL DATA:  Chest pain EXAM: PORTABLE CHEST 1 VIEW  COMPARISON:  04/28/2013 FINDINGS: Lungs are now clear. Interval clearing of right upper lobe infiltrate. Negative for heart failure or effusion. Cervical spine fusion. IMPRESSION: No active disease. Electronically Signed   By: Franchot Gallo M.D.   On: 10/26/2017 20:05    Cardiac Studies   Cath: 10/26/17   Post Atrio lesion is 100% stenosed.  Ost RPDA lesion is 20% stenosed.  Post intervention, there is a 0% residual stenosis.  Dist RCA lesion is 95% stenosed.  A stent was successfully placed.   Acute ST segment elevation inferior myocardial infarction secondary to RCA thrombotic occlusion.  Normal LAD, large ramus intermediate, and left circumflex coronary arteries.  Large dominant RCA with 95% stenosis proximal to the PDA takeoff with extensive thrombus in distal elevation to the distal PLA vessel.  Hypotension secondary to volume depletion with LVEDP 4 mmHg treated with aggressive fluid  resuscitation.  Successful PCI to the dominant RCA with low level PTCA to the PLA vessel, and PTCA/DES stenting to the RCA proximal to the PDA takeoff with insertion of a 3.0 x 16 mm Synergy DES stent postdilated to 3.25 mm with the greater than 95% thrombotic stenosis reduced to 0% with TIMI-3 flow.  There is residual distal PLA allusion resulting from thrombus for which Aggrastat will be continued for 18 hours post procedure.  RECOMMENDATION: Recommend uninterrupted dual antiplatelet therapy with Aspirin 43m daily and Ticagrelor 972mtwice daily for a minimum of 12 months (ACS - Class I recommendation).  Patient will be hydrated post procedure.  Continue Aggrastat for 18 hours.  Once blood pressure remains stable will add beta-blocker therapy for improved heart rate control.  Initiate high potency statin therapy.  Plan 2D echo Doppler study in a.m.  Patient Profile     [age].o. female with PMH of Crohn's who presented with chest pain and CODE STEMI called in the field. Brought to the cath lab for emergent cardiac catheterization.   Assessment & Plan    1. STEMI: Trop peaked at 14.39. Underwent cath noted above with PCI/DES to the dRCA prox to the PDA takeoff. Also noted to have a thrombotic stenosis in the PLA. Planned for aggrastat for total of 18 hours post cath. Placed on DAPT with ASA/Brilinta. Blood pressures were soft in the lab, but improved this morning.  -- add low dose metoprolol -- work with cardiac rehab -- echo pending -- transfer out to telemetry today  2. Hypokalemia: supplement  3. Crohn's disease: continue azathioprine   Signed, LiReino BellisNP  10/27/2017, 10:10 AM  Pager # [phone] I have examined the patient and reviewed assessment and plan and discussed with patient.  Agree with above as stated.  Doing well from a cardiac standpoint.  No anginal chest discomfort.  Right groin is stable.  Palpable right dorsalis pedis pulse.  She does report chronic right  foot pain due to neuropathy.  She is requesting to see an orthopedic surgeon or neurologist while she is in the hospital.  Since this is not an acute issue and she has more pressing cardiac issues, we will plan for outpatient assessment.  [place] Her primary care doctor is in [place] We will have to make sure that she has the ability to get her Brilinta while here in town, as well as when she is out of town in [place] Okay to transfer to telemetry.  JaLarae Grooms For questions or updates,  please contact [place] Please consult [email or web address] for contact info under Cardiology/STEMI.

## 2017-10-27 NOTE — Progress Notes (Signed)
Right femoral sheath removed @ 0326, pt tolerated well. Manual pressure held for 25 minutes, no bleeding or hematoma noted, site is level 0. Pt educated on site care and to call nursing if any signs of bleeding. Bed rest for 6 hours, over @ 0926.  Sherlie Ban, RN
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
## 2017-10-27 NOTE — Progress Notes (Signed)
CARDIAC REHAB PHASE I   Offered to walk with pt, pt declining at this time d/t R leg pain. Pt and son educated on importance of Brilinta, ASA and NTG. Pt strongly encouraged to not miss antiplatelet and take meds as prescribed. Reviewed MI booklet and stenting. Pt given heart healthy diet, son states she has poor dietary habit currently and are going to work on them together. Pt strongly encouraged to quit smoking, tip sheet and fake cigarette given. Reviewed restrictions with pt and son, and discourgaed heavy housework at this time. Will continue to follow and try walking pt tomorrow.   [phone] Rufina Falco, RN BSN 10/27/2017 2:15 PM

## 2017-10-27 NOTE — Progress Notes (Signed)
  Echocardiogram 2D Echocardiogram has been performed.  Johny Chess 10/27/2017, 5:39 PM

## 2017-10-28 ENCOUNTER — Encounter (HOSPITAL_COMMUNITY): Payer: Self-pay | Admitting: Cardiology

## 2017-10-28 ENCOUNTER — Telehealth: Payer: Self-pay | Admitting: Adult Health

## 2017-10-28 LAB — MAGNESIUM: Magnesium: 2.1 mg/dL (ref 1.7–2.4)

## 2017-10-28 MED ORDER — TICAGRELOR 90 MG PO TABS: 90.0000 mg | ORAL_TABLET | Freq: Two times a day (BID) | ORAL | 2 refills | Status: DC

## 2017-10-28 MED ORDER — NITROGLYCERIN 0.4 MG SL SUBL: 0.4000 mg | SUBLINGUAL_TABLET | SUBLINGUAL | 2 refills | Status: AC | PRN

## 2017-10-28 MED ORDER — ASPIRIN 81 MG PO TBEC: 81.0000 mg | DELAYED_RELEASE_TABLET | Freq: Every day | ORAL | Status: DC

## 2017-10-28 MED ORDER — ATORVASTATIN CALCIUM 80 MG PO TABS: 80.0000 mg | ORAL_TABLET | Freq: Every day | ORAL | 1 refills | Status: DC

## 2017-10-28 NOTE — Progress Notes (Signed)
Pt given discharge instructions with understanding. Pt has no questions at this time. IV and monitor d/c. Awaiting ride.

## 2017-10-28 NOTE — Telephone Encounter (Signed)
Pt just discharged today 7/18. First TOC call for 7/19.

## 2017-10-28 NOTE — Care Management Note (Addendum)
Case Management Note  Patient Details  Name: Anne [name] MRN: [id number] Date of Birth: 1955/06/29  Subjective/Objective:       Pt admitted with STEMI             Action/Plan:  PTA independent from home with family.  Pt has PCP and denied barriers with obtaining medications.  Pt instructed 7/17 to call [employer] to determine copay and deductible amounts - pt has not yet done, CM also submitted benefit check - results below, pt can pay copay.    Pt given reduced copay card for Brilinta and free 30 day card - pts pharmacy is [facility] on [address] - they will accept cards.  Attending submitted prior auth prior to pt leaving [facility].  Pt instructed to also follow up with pharmacy within 2 weeks to ensure prior auth is completed - if not she is to follow up with cardiology group.     Expected Discharge Date:  10/28/17               Expected Discharge Plan:  Home/Self Care  In-House Referral:     Discharge planning Services  CM Consult  Post Acute Care Choice:    Choice offered to:     DME Arranged:    DME Agency:     HH Arranged:    HH Agency:     Status of Service:  Completed, signed off  If discussed at [employer] of Stay Meetings, dates discussed:    Additional Comments: CM contacted pharmacy and spoke with Gershon Mussel the pharmacist , pharmacist confirmed pt can use the free 30 card - information given over phone to pharmacist for free 30 card.  Maryclare Labrador, RN 10/28/2017, 12:04 PM

## 2017-10-28 NOTE — Discharge Summary (Addendum)
Discharge Summary    Patient ID: Anne [name],  MRN: [id number], DOB/AGE: Jan 01, 1956 62 y.o.  Admit date: 10/26/2017 Discharge date: 10/28/2017  Primary Care Provider: Patient, No Pcp Per Primary Cardiologist: Dr. Claiborne Billings   Discharge Diagnoses    Active Problems:   STEMI (ST elevation myocardial infarction) [facility])   STEMI involving right coronary artery [facility])  Allergies No Known Allergies  Diagnostic Studies/Procedures    Cath: 10/26/17   Post Atrio lesion is 100% stenosed.  Ost RPDA lesion is 20% stenosed.  Post intervention, there is a 0% residual stenosis.  Dist RCA lesion is 95% stenosed.  A stent was successfully placed.   Acute ST segment elevation inferior myocardial infarction secondary to RCA thrombotic occlusion.  Normal LAD, large ramus intermediate, and left circumflex coronary arteries.  Large dominant RCA with 95% stenosis proximal to the PDA takeoff with extensive thrombus in distal elevation to the distal PLA vessel.  Hypotension secondary to volume depletion with LVEDP 4 mmHg treated with aggressive fluid resuscitation.  Successful PCI to the dominant RCA with low level PTCA to the PLA vessel, and PTCA/DES stenting to the RCA proximal to the PDA takeoff with insertion of a 3.0 x 16 mm Synergy DES stent postdilated to 3.25 mm with the greater than 95% thrombotic stenosis reduced to 0% with TIMI-3 flow.  There is residual distal PLA allusion resulting from thrombus for which Aggrastat will be continued for 18 hours post procedure.  RECOMMENDATION: Recommend uninterrupted dual antiplatelet therapy with Aspirin 67m daily and Ticagrelor 969mtwice daily for a minimum of 12 months (ACS - Class I recommendation).  Patient will be hydrated post procedure.  Continue Aggrastat for 18 hours.  Once blood pressure remains stable will add beta-blocker therapy for improved heart rate control.  Initiate high potency statin therapy.  Plan 2D echo Doppler study in  a.m.  TTE: 10/27/17  Study Conclusions  - Left ventricle: The cavity size was normal. Systolic function was   normal. The estimated ejection fraction was in the range of 50%   to 55%. Wall motion was normal; there were no regional wall   motion abnormalities. Left ventricular diastolic function   parameters were normal. - Aortic valve: Transvalvular velocity was within the normal range.   There was no stenosis. There was no regurgitation. - Mitral valve: Transvalvular velocity was within the normal range.   There was no evidence for stenosis. There was mild regurgitation. - Right ventricle: The cavity size was normal. Wall thickness was   normal. Systolic function was normal. - Tricuspid valve: There was no regurgitation. _____________   History of Present Illness     [age]o female with PMH of Crohn's who presented with sudden onset of chest pain. Anne [name] in her usual state of health until the afternoon of admission at 1pm when she began experiencing central/left-sided chest pain. She reported associated SOB, diaphoresis, and nausea. Her symptoms worsened throughout the day prompting her to call EMS. She was found to have ST elevations in inferior leads and was brought to the hospital for evaluation of her STEMI.  She denied prior cardiac history or significant family history of heart disease. She had never had an ischemic evaluation.   Hospital Course     Underwent cardiac cath noted above with PCI/DES to the dRCA x1, along with thrombus in the PLA. She was continued on aggrastat for 18 hours post cath. Placed on high dose statin post cath. No recurrent chest pain.  Worked well with cardiac rehab. Follow up echo showed low normal EF. Attempted to add low dose BB, but blood pressures became to soft. Have asked that she track her blood pressures and bring to her follow up appt to determine if able to add back.   General: Well developed, well nourished, female appearing in no acute  distress. Head: Normocephalic, atraumatic.  Neck: Supple without bruits, JVD. Lungs:  Resp regular and unlabored, CTA. Heart: RRR, S1, S2, no S3, S4, or murmur; no rub. Abdomen: Soft, non-tender, non-distended with normoactive bowel sounds.  Extremities: No clubbing, cyanosis, edema. Distal pedal pulses are 2+ bilaterally. R femoral cath site stable without bruising or hematoma Neuro: Alert and oriented X 3. Moves all extremities spontaneously. Psych: Normal affect.  Anne [name] was seen by Dr. Irish Lack and determined stable for discharge home. Follow up in the office has been arranged. Medications are listed below.   _____________  Discharge Vitals Blood pressure (!) 91/50, pulse 71, temperature 98.4 F (36.9 C), temperature source Oral, resp. rate 18, height 5' 9"  (1.753 m), weight 158 lb 8.2 oz (71.9 kg), last menstrual period 02/18/2001, SpO2 100 %.  Filed Weights   10/26/17 2007 10/27/17 0500 10/28/17 0300  Weight: 155 lb (70.3 kg) 157 lb 6.5 oz (71.4 kg) 158 lb 8.2 oz (71.9 kg)    Labs & Radiologic Studies    CBC Recent Labs    10/26/17 1947 10/26/17 2051 10/27/17 0029  WBC 9.8  --  12.7*  NEUTROABS 4.3  --   --   HGB 14.1 12.9 13.5  HCT 42.5 38.0 40.9  MCV 97.7  --  97.6  PLT 393  --  380  470   Basic Metabolic Panel Recent Labs    10/26/17 1947 10/26/17 2051 10/27/17 0029 10/28/17 0341  NA 141 142 141  --   K 3.2* 2.4* 3.3*  --   CL 109 107 112*  --   CO2 22  --  22  --   GLUCOSE 114* 104* 102*  --   BUN 16 16 14   --   CREATININE 1.62* 1.20* 1.37*  --   CALCIUM 9.1  --  8.2*  --   MG  --   --  1.6* 2.1   Liver Function Tests Recent Labs    10/26/17 1947  AST 20  ALT 20  ALKPHOS 80  BILITOT 0.5  PROT 6.9  ALBUMIN 3.6   No results for input(s): LIPASE, AMYLASE in the last 72 hours. Cardiac Enzymes Recent Labs    10/27/17 0831 10/27/17 1450 10/27/17 2024  TROPONINI 14.39* 12.63* 12.23*   BNP Invalid input(s): POCBNP D-Dimer No  results for input(s): DDIMER in the last 72 hours. Hemoglobin A1C Recent Labs    10/27/17 0029  HGBA1C 5.6   Fasting Lipid Panel Recent Labs    10/27/17 0029  CHOL 132  HDL 56  LDLCALC 57  TRIG 96  CHOLHDL 2.4   Thyroid Function Tests Recent Labs    10/27/17 0029  TSH 0.760   _____________  Dg Chest Port 1 View  Result Date: 10/26/2017 CLINICAL DATA:  Chest pain EXAM: PORTABLE CHEST 1 VIEW COMPARISON:  04/28/2013 FINDINGS: Lungs are now clear. Interval clearing of right upper lobe infiltrate. Negative for heart failure or effusion. Cervical spine fusion. IMPRESSION: No active disease. Electronically Signed   By: Franchot Gallo M.D.   On: 10/26/2017 20:05   Disposition   Pt is being discharged home today in good condition.  Follow-up Plans & Appointments    Follow-up Information    Lendon Colonel, NP Follow up on 11/08/2017.   Specialties:  Nurse Practitioner, Radiology, Cardiology Why:  at 9:30am for your follow up appt.  Contact information: [address] [place] [address] [phone]          Discharge Instructions    AMB Referral to Cardiac Rehabilitation - Phase II   Complete by:  As directed    Diagnosis:  STEMI   Amb Referral to Cardiac Rehabilitation   Complete by:  As directed    Diagnosis:   STEMI Coronary Stents     Call MD for:  redness, tenderness, or signs of infection (pain, swelling, redness, odor or green/yellow discharge around incision site)   Complete by:  As directed    Diet - low sodium heart healthy   Complete by:  As directed    Discharge instructions   Complete by:  As directed    Groin Site Care Refer to this sheet in the next few weeks. These instructions provide you with information on caring for yourself after your procedure. Your caregiver may also give you more specific instructions. Your treatment has been planned according to current medical practices, but problems sometimes occur. Call your  caregiver if you have any problems or questions after your procedure. HOME CARE INSTRUCTIONS You may shower 24 hours after the procedure. Remove the bandage (dressing) and gently wash the site with plain soap and water. Gently pat the site dry.  Do not apply powder or lotion to the site.  Do not sit in a bathtub, swimming pool, or whirlpool for 5 to 7 days.  No bending, squatting, or lifting anything over 10 pounds (4.5 kg) as directed by your caregiver.  Inspect the site at least twice daily.  Do not drive home if you are discharged the same day of the procedure. Have someone else drive you.  You may drive 24 hours after the procedure unless otherwise instructed by your caregiver.  What to expect: Any bruising will usually fade within 1 to 2 weeks.  Blood that collects in the tissue (hematoma) may be painful to the touch. It should usually decrease in size and tenderness within 1 to 2 weeks.  SEEK IMMEDIATE MEDICAL CARE IF: You have unusual pain at the groin site or down the affected leg.  You have redness, warmth, swelling, or pain at the groin site.  You have drainage (other than a small amount of blood on the dressing).  You have chills.  You have a fever or persistent symptoms for more than 72 hours.  You have a fever and your symptoms suddenly get worse.  Your leg becomes pale, cool, tingly, or numb.  You have heavy bleeding from the site. Hold pressure on the site. Marland Kitchen  PLEASE DO NOT MISS ANY DOSES OF YOUR BRILINTA!!!!! Also keep a log of you blood pressures and bring back to your follow up appt. Please call the office with any questions.   Patients taking blood thinners should generally stay away from medicines like ibuprofen, Advil, Motrin, naproxen, and Aleve due to risk of stomach bleeding. You may take Tylenol as directed or talk to your primary doctor about alternatives.   Increase activity slowly   Complete by:  As directed        Discharge Medications     Medication  List    TAKE these medications   acetaminophen-codeine 300-60 MG tablet Commonly known as:  TYLENOL #4 Take 1 tablet by mouth every 8 (eight) hours as needed for moderate pain.   alendronate 70 MG tablet Commonly known as:  FOSAMAX Take 70 mg by mouth once a week. Take with a full glass of water on an empty stomach.   aspirin 81 MG EC tablet Take 1 tablet (81 mg total) by mouth daily. Start taking on:  10/29/2017   atorvastatin 80 MG tablet Commonly known as:  LIPITOR Take 1 tablet (80 mg total) by mouth daily at 6 PM.   AZASAN 75 MG Tabs Generic drug:  Azathioprine Take 150 mg by mouth 2 (two) times daily.   cyclobenzaprine 10 MG tablet Commonly known as:  FLEXERIL Take 10 mg by mouth 2 (two) times daily as needed for muscle spasms.   diphenoxylate-atropine 2.5-0.025 MG tablet Commonly known as:  LOMOTIL Take 1 tablet by mouth every other day.   famotidine 40 MG tablet Commonly known as:  PEPCID Take 40 mg at bedtime by mouth.   Lidocaine 1.8 % Ptch Apply 1 patch topically every 12 (twelve) hours as needed (pain).   multivitamin with minerals Tabs tablet Take 1 tablet by mouth daily.   nitroGLYCERIN 0.4 MG SL tablet Commonly known as:  NITROSTAT Place 1 tablet (0.4 mg total) under the tongue every 5 (five) minutes x 3 doses as needed for chest pain.   ticagrelor 90 MG Tabs tablet Commonly known as:  BRILINTA Take 1 tablet (90 mg total) by mouth 2 (two) times daily.   vitamin E 100 UNIT capsule Take 100 Units daily by mouth.        Acute coronary syndrome (MI, NSTEMI, STEMI, etc) this admission?: Yes.     AHA/ACC Clinical Performance & Quality Measures: 1. Aspirin prescribed? - Yes 2. ADP Receptor Inhibitor (Plavix/Clopidogrel, Brilinta/Ticagrelor or Effient/Prasugrel) prescribed (includes medically managed patients)? - Yes 3. Beta Blocker prescribed? - No - blood pressure were soft 4. High Intensity Statin (Lipitor 40-61m or Crestor 20-418m prescribed? -  Yes 5. EF assessed during THIS hospitalization? - Yes 6. For EF <40%, was ACEI/ARB prescribed? - Not Applicable (EF >/= 4006%7. For EF <40%, Aldosterone Antagonist (Spironolactone or Eplerenone) prescribed? - Not Applicable (EF >/= 4030%8. Cardiac Rehab Phase II ordered (Included Medically managed Patients)? - Yes   Outstanding Labs/Studies   FLP/LFTs in 6 weeks of tolerating statin.   Duration of Discharge Encounter   Greater than 30 minutes including physician time.  Signed, LiReino BellisP-C 10/28/2017, 11:42 AM   I have examined the patient and reviewed assessment and plan and discussed with patient.  Agree with above as stated.    GEN: Well nourished, well developed, in no acute distress  HEENT: normal  Neck: no JVD, carotid bruits, or masses Cardiac: RRR; no murmurs, rubs, or gallops,no edema  Respiratory:  clear to auscultation bilaterally, normal work of breathing GI: soft, nontender, nondistended,  MS: no deformity or atrophy  Skin: warm and dry, no rash Neuro:  Strength and sensation are intact Psych: euthymic mood, full affect Doing well post PCI.  Letter sent to her insurance to get Brilinta removed.  COntinue aggressive secondary prevention including lipid lowering therapy.  OK for discharge later today.  Anne [name]

## 2017-10-28 NOTE — Care Management (Addendum)
  Brilinta benefit check BRILINTA  90 MG BID  COVER- YES  CO-PAY- $ 45.50  TIER- 2 DRUG  PRIOR APPROVAL- YES # [phone]   PREFERRED PHARMACY : YES [facility]  CM left VM for attending and also sent text page to inform of prior auth requirement,

## 2017-10-28 NOTE — Plan of Care (Signed)
  Problem: Education: Goal: Knowledge of General Education information will improve Outcome: Completed/Met   Problem: Health Behavior/Discharge Planning: Goal: Ability to manage health-related needs will improve Outcome: Completed/Met   Problem: Clinical Measurements: Goal: Ability to maintain clinical measurements within normal limits will improve Outcome: Completed/Met Goal: Will remain free from infection Outcome: Completed/Met Goal: Diagnostic test results will improve Outcome: Completed/Met Goal: Respiratory complications will improve Outcome: Completed/Met Goal: Cardiovascular complication will be avoided Outcome: Completed/Met   Problem: Activity: Goal: Risk for activity intolerance will decrease Outcome: Completed/Met   Problem: Nutrition: Goal: Adequate nutrition will be maintained Outcome: Completed/Met   Problem: Coping: Goal: Level of anxiety will decrease Outcome: Completed/Met   Problem: Elimination: Goal: Will not experience complications related to bowel motility Outcome: Completed/Met Goal: Will not experience complications related to urinary retention Outcome: Completed/Met   Problem: Pain Managment: Goal: General experience of comfort will improve Outcome: Completed/Met   Problem: Safety: Goal: Ability to remain free from injury will improve Outcome: Completed/Met   Problem: Skin Integrity: Goal: Risk for impaired skin integrity will decrease Outcome: Completed/Met   Problem: Education: Goal: Understanding of cardiac disease, CV risk reduction, and recovery process will improve Outcome: Completed/Met Goal: Understanding of medication regimen will improve Outcome: Completed/Met   Problem: Activity: Goal: Ability to tolerate increased activity will improve Outcome: Completed/Met   Problem: Cardiac: Goal: Ability to achieve and maintain adequate cardiopulmonary perfusion will improve Outcome: Completed/Met Goal: Vascular access site(s)  Level 0-1 will be maintained Outcome: Completed/Met   Problem: Health Behavior/Discharge Planning: Goal: Ability to safely manage health-related needs after discharge will improve Outcome: Completed/Met

## 2017-10-28 NOTE — Progress Notes (Signed)
Responded to page to assist patient with AD.  Pt requested that AD be left and will have nurse to page chaplain when ready.  Will follow as needed.  Jaclynn Major, [place], [facility], Pager [phone]

## 2017-10-28 NOTE — Telephone Encounter (Signed)
Per staff message from Reino Bellis.Patient needs a TOC f/u call. Thanks Pt has an appt w/[facility] 7.29.19 @ 9:30

## 2017-10-28 NOTE — Progress Notes (Signed)
VIS given to patient.

## 2017-10-28 NOTE — Progress Notes (Signed)
CARDIAC REHAB PHASE I   PRE:  Rate/Rhythm: 93 SR  BP:  Sitting: 91/50      SaO2: 99 RA  MODE:  Ambulation: 370 ft 102 peak HR  POST:  Rate/Rhythm: 96 SR  BP:  Sitting: 125/71    SaO2: 98 RA   Pt ambulated in hallway independently with steady gait. Pt denies CP or SOB, states R leg feels better today. Reviewed signs and symptoms of a heart attack with pt and what to look at for in the future. Reviewed importance of Brilinta, and NTG. Pt given exercise guidelines. Will refer to [facility] [place] as pt spends most of her time here.  [id number] Rufina Falco, RN BSN 10/28/2017 10:34 AM

## 2017-10-29 NOTE — Telephone Encounter (Signed)
Attempted to call pt; no answer and VM unavailable.

## 2017-11-02 NOTE — Telephone Encounter (Signed)
Patient contacted regarding discharge from [facility] on 10-28-17.  Patient reports she has a follow up appointment with a provider in [place] that takes her insurance. They are not part of the [facility] network. She reports she is not coming back to see [place]. Follow up appointment canceled.

## 2017-11-08 ENCOUNTER — Ambulatory Visit: Payer: BLUE CROSS/BLUE SHIELD | Admitting: Adult Health

## 2017-11-10 ENCOUNTER — Telehealth (HOSPITAL_COMMUNITY): Payer: Self-pay

## 2017-11-10 NOTE — Telephone Encounter (Signed)
Attempted to call patient regarding Cardiac Rehab. Lm on VM

## 2017-11-17 ENCOUNTER — Emergency Department (HOSPITAL_COMMUNITY): Payer: BLUE CROSS/BLUE SHIELD

## 2017-11-17 ENCOUNTER — Other Ambulatory Visit: Payer: Self-pay

## 2017-11-17 ENCOUNTER — Encounter (HOSPITAL_COMMUNITY): Payer: Self-pay | Admitting: *Deleted

## 2017-11-17 ENCOUNTER — Emergency Department (HOSPITAL_COMMUNITY)
Admission: EM | Admit: 2017-11-17 | Discharge: 2017-11-17 | Disposition: A | Discharge status: Home / Self Care | Payer: BLUE CROSS/BLUE SHIELD | Attending: Emergency Medicine | Admitting: Emergency Medicine

## 2017-11-17 DIAGNOSIS — Z79899 Other long term (current) drug therapy: Secondary | ICD-10-CM | POA: Insufficient documentation

## 2017-11-17 DIAGNOSIS — F17200 Nicotine dependence, unspecified, uncomplicated: Secondary | ICD-10-CM | POA: Diagnosis not present

## 2017-11-17 DIAGNOSIS — R42 Dizziness and giddiness: Secondary | ICD-10-CM | POA: Diagnosis present

## 2017-11-17 DIAGNOSIS — M5410 Radiculopathy, site unspecified: Secondary | ICD-10-CM

## 2017-11-17 DIAGNOSIS — G8929 Other chronic pain: Secondary | ICD-10-CM | POA: Diagnosis not present

## 2017-11-17 DIAGNOSIS — N3001 Acute cystitis with hematuria: Secondary | ICD-10-CM | POA: Diagnosis not present

## 2017-11-17 DIAGNOSIS — M5441 Lumbago with sciatica, right side: Secondary | ICD-10-CM | POA: Diagnosis not present

## 2017-11-17 DIAGNOSIS — Z7982 Long term (current) use of aspirin: Secondary | ICD-10-CM | POA: Diagnosis not present

## 2017-11-17 LAB — URINALYSIS, ROUTINE W REFLEX MICROSCOPIC
Bilirubin Urine: NEGATIVE
Glucose, UA: NEGATIVE mg/dL
Hgb urine dipstick: NEGATIVE
Ketones, ur: 5 mg/dL — AB
Nitrite: POSITIVE — AB
Protein, ur: 100 mg/dL — AB
Specific Gravity, Urine: 1.021 (ref 1.005–1.030)
WBC, UA: >50 WBC/hpf — ABNORMAL HIGH (ref 0–5)
pH: 5 (ref 5.0–8.0)

## 2017-11-17 LAB — BASIC METABOLIC PANEL
Anion gap: 11 (ref 5–15)
BUN: 19 mg/dL (ref 8–23)
CO2: 21 mmol/L — ABNORMAL LOW (ref 22–32)
Calcium: 9.3 mg/dL (ref 8.9–10.3)
Chloride: 110 mmol/L (ref 98–111)
Creatinine, Ser: 1.28 mg/dL — ABNORMAL HIGH (ref 0.44–1.00)
GFR calc Af Amer: 50 mL/min — ABNORMAL LOW (ref >60)
GFR calc non Af Amer: 43 mL/min — ABNORMAL LOW (ref >60)
Glucose, Bld: 97 mg/dL (ref 70–99)
Potassium: 3.6 mmol/L (ref 3.5–5.1)
Sodium: 142 mmol/L (ref 135–145)

## 2017-11-17 LAB — CBC
HCT: 42.3 % (ref 36.0–46.0)
Hemoglobin: 14 g/dL (ref 12.0–15.0)
MCH: 32.8 pg (ref 26.0–34.0)
MCHC: 33.1 g/dL (ref 30.0–36.0)
MCV: 99.1 fL (ref 78.0–100.0)
Platelets: 572 10*3/uL — ABNORMAL HIGH (ref 150–400)
RBC: 4.27 MIL/uL (ref 3.87–5.11)
RDW: 15.9 % — ABNORMAL HIGH (ref 11.5–15.5)
WBC: 6.3 10*3/uL (ref 4.0–10.5)

## 2017-11-17 LAB — POC OCCULT BLOOD, ED: Fecal Occult Bld: NEGATIVE

## 2017-11-17 LAB — I-STAT TROPONIN, ED: Troponin i, poc: 0 ng/mL (ref 0.00–0.08)

## 2017-11-17 MED ORDER — MORPHINE SULFATE (PF) 4 MG/ML IV SOLN
4.0000 mg | Freq: Once | INTRAVENOUS | Status: AC
  Administered 2017-11-17: 4 mg via INTRAVENOUS
  Filled 2017-11-17: qty 1

## 2017-11-17 MED ORDER — CEPHALEXIN 500 MG PO CAPS: 500.0000 mg | ORAL_CAPSULE | Freq: Four times a day (QID) | ORAL | 0 refills | Status: AC

## 2017-11-17 MED ORDER — GADOBENATE DIMEGLUMINE 529 MG/ML IV SOLN
15.0000 mL | Freq: Once | INTRAVENOUS | Status: AC
  Administered 2017-11-17: 15 mL via INTRAVENOUS

## 2017-11-17 MED ORDER — DEXAMETHASONE SODIUM PHOSPHATE 10 MG/ML IJ SOLN
10.0000 mg | Freq: Once | INTRAMUSCULAR | Status: AC
  Administered 2017-11-17: 10 mg via INTRAVENOUS
  Filled 2017-11-17: qty 1

## 2017-11-17 MED ORDER — PREDNISONE 10 MG (21) PO TBPK: ORAL_TABLET | Freq: Every day | ORAL | 0 refills | Status: DC

## 2017-11-17 MED ORDER — SODIUM CHLORIDE 0.9 % IV SOLN
1.0000 g | Freq: Once | INTRAVENOUS | Status: AC
  Administered 2017-11-17: 1 g via INTRAVENOUS
  Filled 2017-11-17: qty 10

## 2017-11-17 MED ORDER — HYDROMORPHONE HCL 1 MG/ML IJ SOLN
0.5000 mg | Freq: Once | INTRAMUSCULAR | Status: AC
Start: 2017-11-17 — End: 2017-11-17
  Administered 2017-11-17: 0.5 mg via INTRAVENOUS
  Filled 2017-11-17: qty 1

## 2017-11-17 MED ORDER — DIAZEPAM 5 MG PO TABS
5.0000 mg | ORAL_TABLET | Freq: Once | ORAL | Status: AC
  Administered 2017-11-17: 5 mg via ORAL
  Filled 2017-11-17: qty 1

## 2017-11-17 MED ORDER — SODIUM CHLORIDE 0.9 % IV BOLUS
1000.0000 mL | Freq: Once | INTRAVENOUS | Status: AC
  Administered 2017-11-17: 1000 mL via INTRAVENOUS

## 2017-11-17 MED ORDER — ACETAMINOPHEN-CODEINE #4 300-60 MG PO TABS: 1.0000 | ORAL_TABLET | Freq: Four times a day (QID) | ORAL | 0 refills | Status: DC | PRN

## 2017-11-17 NOTE — ED Notes (Signed)
Pt given urine cup to provide urine sample out in the waiting room.

## 2017-11-17 NOTE — ED Notes (Signed)
REPAGED [facility]

## 2017-11-17 NOTE — Discharge Instructions (Signed)
Please take Keflex (antibiotic) as directed for UTI Take Tylenol #4 for moderate-severe pain. Try heat/ice and topical medications as well (Icy hot, biofreeze, etc) Finish Steroid dose pack for inflammation. Take as directed Please follow up with neurosurgery and your primary doctor Return if worsening

## 2017-11-17 NOTE — Consult Note (Signed)
Reason for Consult: Back and right leg pain Referring Physician: EDP  Anne [name] is an 62 y.o. female.   HPI:  62 year old female who had an MI 3 weeks ago and had stenting and is being treated with antiplatelet agents who comes with a 2 month history of back and right leg pain. MRI showed a herniated disc at L5-S1 on the right with compression of the S1 and L5 nerve roots. She also has severe degenerative disc disease at L4-5. She has a long history of back problems. She had a previous cervical fusion while living in [place]. She has had fecal Incontinence for about 4-6 weeks. Her pain is severe pain medications are not helping her.  Past Medical History:  Diagnosis Date  . Crohn's colitis ([place])   . STEMI (ST elevation myocardial infarction) ([place])    10/27/17 PCI/DES x1 to the dRCA, with residual thrombus in the PLA, normal EF    Past Surgical History:  Procedure Laterality Date  . CORONARY/GRAFT ACUTE MI REVASCULARIZATION N/A 10/26/2017   Procedure: Coronary/Graft Acute MI Revascularization;  Surgeon: Troy Sine, MD;  Location: [facility];  Service: Cardiovascular;  Laterality: N/A;  . LEFT HEART CATH AND CORONARY ANGIOGRAPHY N/A 10/26/2017   Procedure: LEFT HEART CATH AND CORONARY ANGIOGRAPHY;  Surgeon: Troy Sine, MD;  Location: [facility];  Service: Cardiovascular;  Laterality: N/A;  . SMALL INTESTINE SURGERY      No Known Allergies  Social History   Tobacco Use  . Smoking status: Current Some Day Smoker    Packs/day: 0.50  . Smokeless tobacco: Never Used  Substance Use Topics  . Alcohol use: No    Frequency: Never    History reviewed. No pertinent family history.   Review of Systems  Positive ROS: neg  All other systems have been reviewed and were otherwise negative with the exception of those mentioned in the HPI and as above.  Objective: Vital signs in last 24 hours: Temp:  [97.9 F (36.6 C)] 97.9 F (36.6 C) (08/07 1002) Pulse Rate:   [70-113] 77 (08/07 1800) Resp:  [18-25] 19 (08/07 1230) BP: (109-134)/(70-97) 111/93 (08/07 1800) SpO2:  [99 %-100 %] 99 % (08/07 1800)  General Appearance: Alert, cooperative, no distress, appears stated age Head: Normocephalic, without obvious abnormality, atraumatic Eyes: PERRL, conjunctiva/corneas clear, EOM's intact, fundi benign, both eyes      Ears: Normal TM's and external ear canals, both ears Throat: benign Neck: Supple, symmetrical, trachea midline, no adenopathy; thyroid: No enlargement/tenderness/nodules; no carotid bruit or JVD Back: Symmetric, no curvature, ROM normal, no CVA tenderness Lungs: Clear to auscultation bilaterally, respirations unlabored Heart: Regular rate and rhythm, S1 and S2 normal, no murmur, rub or gallop Abdomen: Soft, non-tender, bowel sounds active all four quadrants, no masses, no organomegaly Extremities: Extremities normal, atraumatic, no cyanosis or edema Pulses: 2+ and symmetric all extremities Skin: Skin color, texture, turgor normal, no rashes or lesions  NEUROLOGIC:   Mental status: A&O x4, no aphasia, good attention span, Memory and fund of knowledge Motor Exam - grossly normal, normal tone and bulk Sensory Exam - grossly normal Reflexes: symmetric, no pathologic reflexes, No Hoffman's, No clonus Coordination - grossly normal Gait - grossly normal Balance - grossly normal Cranial Nerves: I: smell Not tested  II: visual acuity  OS: na    OD: na  II: visual fields Full to confrontation  II: pupils Equal, round, reactive to light  III,VII: ptosis None  III,IV,VI: extraocular muscles  Full ROM  V: mastication Normal  V: facial light touch sensation  Normal  V,VII: corneal reflex  Present  VII: facial muscle function - upper  Normal  VII: facial muscle function - lower Normal  VIII: hearing Not tested  IX: soft palate elevation  Normal  IX,X: gag reflex Present  XI: trapezius strength  5/5  XI: sternocleidomastoid strength 5/5  XI:  neck flexion strength  5/5  XII: tongue strength  Normal    Data Review Lab Results  Component Value Date   WBC 6.3 11/17/2017   HGB 14.0 11/17/2017   HCT 42.3 11/17/2017   MCV 99.1 11/17/2017   PLT 572 (H) 11/17/2017   Lab Results  Component Value Date   NA 142 11/17/2017   K 3.6 11/17/2017   CL 110 11/17/2017   CO2 21 (L) 11/17/2017   BUN 19 11/17/2017   CREATININE 1.28 (H) 11/17/2017   GLUCOSE 97 11/17/2017   Lab Results  Component Value Date   INR 0.99 10/26/2017    Radiology: Dg Chest 2 View  Result Date: 11/17/2017 CLINICAL DATA:  Generalized weakness for several months with shortness of breath EXAM: CHEST - 2 VIEW COMPARISON:  October 26, 2017 FINDINGS: The heart size and mediastinal contours are within normal limits. There is no focal infiltrate, pulmonary edema, or pleural effusion. The visualized skeletal structures are stable. There is scoliosis of spine. Prior cholecystectomy clips are identified in the right quadrant. IMPRESSION: No active cardiopulmonary disease. Electronically Signed   By: Abelardo Diesel M.D.   On: 11/17/2017 13:01   Mr Lumbar Spine W Wo Contrast (assess For Abscess, Cord Compression)  Result Date: 11/17/2017 CLINICAL DATA:  Back pain and right buttock pain and right leg pain. EXAM: MRI LUMBAR SPINE WITHOUT AND WITH CONTRAST TECHNIQUE: Multiplanar and multiecho pulse sequences of the lumbar spine were obtained without and with intravenous contrast. CONTRAST:  22m MULTIHANCE GADOBENATE DIMEGLUMINE 529 MG/ML IV SOLN COMPARISON:  None. FINDINGS: Segmentation:  Standard. Alignment:  Physiologic. Vertebrae:  No fracture, evidence of discitis, or bone lesion. Conus medullaris and cauda equina: Conus extends to the L2 level. Conus and cauda equina appear normal. Paraspinal and other soft tissues: 10 mm cyst in the mid left kidney. Otherwise negative. Disc levels: L1-2: Normal. L2-3: Tiny broad-based disc bulge slightly asymmetric to the left without neural  impingement. L3-4: Small broad-based disc bulge with a small subligamentous disc extrusion extending inferiorly behind the body of L4 central and slightly to the right best seen on image 17 of series 8. No focal neural impingement. Minimal bilateral facet arthritis. L4-5: Chronic partially calcified broad-based disc protrusion with accompanying osteophytes compressing the thecal sac and both lateral recesses, best seen on image 21 of series 15. There is fairly severe compression of the thecal sac at that site. Slight bilateral foraminal stenosis. However, the L4 nerves appear to exit without impingement. L5-S1: Large broad-based soft disc protrusion primarily central and to the right extending in and lateral to the right neural foramen best seen on image 25 of series 9. There is an extrusion into and lateral to the right neural foramen compressing the right L5 nerve in and lateral to the foramen. The right side of the thecal sac is compressed and the right S1 nerve is posteriorly deviated within the thecal sac best seen on image 25 of series 8. There is moderately severe right facet arthritis with slight ligamentum flavum hypertrophy. Slight left facet arthritis. With fairly severe left foraminal stenosis best seen on  image 12 of series 6. Focal degenerative changes of the vertebral endplates asymmetric to the left. After contrast administration there is enhancement of the facet joints at L5-S1 with slight enhancement of the epidural tissue around the chronic disc protrusion at L4-5. IMPRESSION: 1. Large soft disc protrusion and extrusion central and to the right at L5-S1 extending in and lateral to the right neural foramen compressing the right L5 nerve in the neural foramen and deviating the right S1 nerve within the thecal sac. 2. Large chronic calcified disc protrusion at L4-5 with accompanying osteophytes creating moderately severe compression of the thecal sac symmetrically. This could affect either or both L5  nerves. 3. Small subligamentous disc extrusion slightly to the right of midline at L3-4 without neural impingement. Electronically Signed   By: Lorriane Shire M.D.   On: 11/17/2017 16:37     Assessment/Plan: Estimated body mass index is 23.41 kg/m as calculated from the following:   Height as of 10/26/17: 5' 9"  (1.753 m).   Weight as of 10/28/17: 71.9 kg (158 lb 8.2 oz).   61 year old female with a recent MI status post stenting and treatment with brilinta who presents with a two-month history of left piriformis pain and left leg pain with some numbness in the big toe. The pain is fairly severe. She has been treated with analgesics without relief. She has an appointment with a neurosurgeon from [facility] in [place] on the 22nd.  Loosely with a recent MI and treatment with antiplatelet agent she is not a candidate for surgery or injection therapy anytime soon. She would likely have to remain on treatment for 6-12 months. Not do anything surgical in the face of a recent MI 3 weeks ago. She has an appointment with a neurosurgeon [facility] in a couple of weeks and I've urged her to keep that appointment. She will have to be treated with physical therapy and medical therapy and potentially chiropractic. Certainly, there would be significant risk to any type of surgical intervention 3 weeks after MI and anti platelet agents  She does have a partial foot drop but unfortunately there is just nothing surgical I can do. I do not believe her fecal incontinence is coming from her lumbar spine as there is no high-grade central stenosis. Fecal incontinence is also very rare with lumbar spine disease without spinal cord injury   Welford Christmas S 11/17/2017 7:53 PM

## 2017-11-17 NOTE — ED Triage Notes (Signed)
Pt c/o generalized weakness and dizziness onset x several months, pt states, "I have a deteriorating nerve in my right leg and it is getting worse." pt c/o R buttock area that radiates to R leg, pt A&O x4, no facial droop or slurred speech noted

## 2017-11-17 NOTE — ED Notes (Signed)
Patient transported to X-ray 

## 2017-11-17 NOTE — ED Notes (Signed)
Pt ambulated to lobby without assistance after receiving d/c paperwork and prescriptions. Pt had no further questions or concerns at time of d/c.

## 2017-11-17 NOTE — ED Notes (Signed)
REpaged [facility]

## 2017-11-17 NOTE — ED Provider Notes (Signed)
Pt signed out to me by Anne Kingfisher PA-C. She is a 62 year old female who presents with worsening low back pain with radicular symptoms on the right side as well as generalized weakness and lightheadedness. Blood work has been reassuring here. Her UA does appear consistent with UTI. MRI of the back is pending at shift change.  5:31 PM Reevaluated the patient. She states she is still in a lot of pain. She states that she has a lot of difficulty walking and has had fecal incontinence for about 1-2 months. Symptoms have progressively worsened to the point where she "can't take it anymore" and this is the reason she came today. She denies any falls.  8:22 PM Dr. Ronnald Ramp with [facility] has seen the patient. Unfortunately due to her recent MI she is not a surgical candidate any time soon. She apparently has an appointment with [facility] later this month. He advised PT and pain control at this time. She was given rx for pain medicine, steroid dose pack, and keflex for UTI at discharge.     Recardo Evangelist, PA-C 11/17/17 2025    Drenda Freeze, MD 11/18/17 [phone]

## 2017-11-17 NOTE — Telephone Encounter (Signed)
Called patient in regards to Cardiac Rehab, patient states we are out of network with her insurance company. And they will not cover the program.   Closed referral

## 2017-11-17 NOTE — ED Provider Notes (Signed)
[facility] EMERGENCY DEPARTMENT Provider Note   CSN: [id number] Arrival date & time: 11/17/17  6503     History   Chief Complaint Chief Complaint  Patient presents with  . Weakness  . Dizziness    HPI Anne [name] is a 62 y.o. female with history of Crohn's colitis and recent STEMI presents today for evaluation of acute onset, intermittent lightheadedness for 1.5 months.  She states that she was recently admitted for STEMI on 10/26/2017 and discharged 2 days later status post PCI.  States she has been compliant with her Brilinta.  She notes lightheadedness specifically with position changes and in the mornings, denies syncope.  She notes generalized fatigue as well as dyspnea on exertion.  Denies any chest pain recently.  She states that she has had constant right lower extremity pain for 3 months.  Pain begins at the right buttock and radiates around the lateral aspect of the right lower extremity in the dorsum of the foot.  Pain is constant, burning and sharp, worsens with bending.  She has taken Tylenol No. 4 in the past with some relief but ran out of that.  Has been taking Tylenol without significant relief of her symptoms.  She notes that for the past month or so she will have incontinence of stool only at night and has been wearing adult diapers.  She denies urinary incontinence or retention.  She denies any other urinary symptoms.  She denies saddle anesthesia, IV drug use, or fevers.  She has an appointment with her cardiologist coming up as well as to establish care with a PCP on 12/03/2017.  The history is provided by the patient.    Past Medical History:  Diagnosis Date  . Crohn's colitis ([place])   . STEMI (ST elevation myocardial infarction) ([place])    10/27/17 PCI/DES x1 to the dRCA, with residual thrombus in the PLA, normal EF    Patient Active Problem List   Diagnosis Date Noted  . STEMI (ST elevation myocardial infarction) ([place]) 10/26/2017  . STEMI  involving right coronary artery ([place]) 10/26/2017    Past Surgical History:  Procedure Laterality Date  . CORONARY/GRAFT ACUTE MI REVASCULARIZATION N/A 10/26/2017   Procedure: Coronary/Graft Acute MI Revascularization;  Surgeon: Troy Sine, MD;  Location: [facility];  Service: Cardiovascular;  Laterality: N/A;  . LEFT HEART CATH AND CORONARY ANGIOGRAPHY N/A 10/26/2017   Procedure: LEFT HEART CATH AND CORONARY ANGIOGRAPHY;  Surgeon: Troy Sine, MD;  Location: [facility];  Service: Cardiovascular;  Laterality: N/A;  . SMALL INTESTINE SURGERY       OB History   None      Home Medications    Prior to Admission medications   Medication Sig Start Date End Date Taking? Authorizing Provider  alendronate (FOSAMAX) 70 MG tablet Take 70 mg by mouth once a week. Take with a full glass of water on an empty stomach.   Yes [provider]  aspirin EC 81 MG EC tablet Take 1 tablet (81 mg total) by mouth daily. 10/29/17  Yes Cheryln Manly, NP  atorvastatin (LIPITOR) 80 MG tablet Take 1 tablet (80 mg total) by mouth daily at 6 PM. 10/28/17  Yes Cheryln Manly, NP  AZASAN 75 MG TABS Take 150 mg by mouth 2 (two) times daily.  01/25/17  Yes [provider]  cyclobenzaprine (FLEXERIL) 10 MG tablet Take 10 mg by mouth 2 (two) times daily as needed for muscle spasms.  Yes [provider]  diphenoxylate-atropine (LOMOTIL) 2.5-0.025 MG tablet Take 1 tablet by mouth every other day.    Yes [provider]  famotidine (PEPCID) 40 MG tablet Take 40 mg at bedtime by mouth. 01/14/17  Yes [provider]  gabapentin (NEURONTIN) 400 MG capsule Take 400 mg by mouth 3 (three) times daily.   Yes [provider]  Lactobacillus (PROBIOTIC ACIDOPHILUS PO) Take 1 capsule by mouth daily.   Yes [provider]  Multiple Vitamin (MULTIVITAMIN WITH MINERALS) TABS tablet Take 1 tablet by mouth daily.   Yes [provider]    nitroGLYCERIN (NITROSTAT) 0.4 MG SL tablet Place 1 tablet (0.4 mg total) under the tongue every 5 (five) minutes x 3 doses as needed for chest pain. 10/28/17  Yes Cheryln Manly, NP  ticagrelor (BRILINTA) 90 MG TABS tablet Take 1 tablet (90 mg total) by mouth 2 (two) times daily. 10/28/17  Yes Reino Bellis B, NP  vitamin E 100 UNIT capsule Take 100 Units daily by mouth.   Yes [provider]  acetaminophen-codeine (TYLENOL #4) 300-60 MG tablet Take 1 tablet by mouth every 6 (six) hours as needed for moderate pain or severe pain. 11/17/17   Nils Flack, Anastasio Wogan A, PA-C  cephALEXin (KEFLEX) 500 MG capsule Take 1 capsule (500 mg total) by mouth 4 (four) times daily for 7 days. 11/17/17 11/24/17  Rodell Perna A, PA-C  Lidocaine 1.8 % PTCH Apply 1 patch topically every 12 (twelve) hours as needed (pain).    [provider]  predniSONE (STERAPRED UNI-PAK 21 TAB) 10 MG (21) TBPK tablet Take by mouth daily. Take 6 tabs by mouth daily  for 2 days, then 5 tabs for 2 days, then 4 tabs for 2 days, then 3 tabs for 2 days, 2 tabs for 2 days, then 1 tab by mouth daily for 2 days 11/17/17   Renita Papa, PA-C    Family History No family history on file.  Social History Social History   Tobacco Use  . Smoking status: Current Some Day Smoker    Packs/day: 0.50  . Smokeless tobacco: Never Used  Substance Use Topics  . Alcohol use: No    Frequency: Never  . Drug use: No     Allergies   Patient has no known allergies.   Review of Systems Review of Systems  Constitutional: Positive for fatigue. Negative for chills and fever.  Respiratory: Positive for shortness of breath.   Cardiovascular: Negative for chest pain.  Gastrointestinal: Negative for abdominal pain, nausea and vomiting.  Musculoskeletal: Negative for arthralgias and back pain.  Neurological: Positive for weakness (generalized), light-headedness and numbness (right great toe). Negative for syncope.  All other systems reviewed and  are negative.    Physical Exam Updated Vital Signs BP 124/80   Pulse 75   Temp 97.9 F (36.6 C) (Oral)   Resp 19   LMP 02/18/2001   SpO2 100%   Physical Exam  Constitutional: She is oriented to person, place, and time. She appears well-developed and well-nourished. No distress.  HENT:  Head: Normocephalic and atraumatic.  Eyes: Conjunctivae are normal. Right eye exhibits no discharge. Left eye exhibits no discharge.  Neck: Normal range of motion. Neck supple. No JVD present. No tracheal deviation present.  Cardiovascular: Normal rate, regular rhythm, normal heart sounds and intact distal pulses.  Pulmonary/Chest: Effort normal and breath sounds normal.   Equal rise and fall of chest, no increased work of breathing.  Speaking full sentences  without difficulty.  Abdominal: Soft. Bowel sounds are normal. She exhibits no distension. There is no tenderness. There is no guarding.  Genitourinary:  Genitourinary Comments: Examination performed in the presence of chaperone.  No frank rectal bleeding.  Scant amount of stool in the rectal vault.  Internal hemorrhoid palpated.  Rectal tone intact.  Musculoskeletal: She exhibits no edema.  No midline spine TTP, no paraspinal muscle tenderness, no deformity, crepitus, or step-off noted.  Mild tenderness at the ischial tuberosity.  4+ strength of right hip flexors, right EHL, and right gastrocs, otherwise 5/5 strength of BLE major muscle groups.  Normal range of motion of the bilateral lower extremities and lumbar spine with pain elicited with extension.  Positive straight leg raise on the right.  Neurological: She is alert and oriented to person, place, and time. A sensory deficit is present. She exhibits normal muscle tone.  Fluent speech with no evidence of dysarthria or aphasia, no facial droop.  Ambulates with a mildly antalgic gait, requires some assistance for Heel Walk and Toe Walk  Skin: Skin is warm and dry. No erythema.  Psychiatric: She  has a normal mood and affect. Her behavior is normal.  Nursing note and vitals reviewed.    ED Treatments / Results  Labs (all labs ordered are listed, but only abnormal results are displayed) Labs Reviewed  BASIC METABOLIC PANEL - Abnormal; Notable for the following components:      Result Value   CO2 21 (*)    Creatinine, Ser 1.28 (*)    GFR calc non Af Amer 43 (*)    GFR calc Af Amer 50 (*)    All other components within normal limits  CBC - Abnormal; Notable for the following components:   RDW 15.9 (*)    Platelets 572 (*)    All other components within normal limits  URINALYSIS, ROUTINE W REFLEX MICROSCOPIC - Abnormal; Notable for the following components:   Color, Urine AMBER (*)    APPearance HAZY (*)    Ketones, ur 5 (*)    Protein, ur 100 (*)    Nitrite POSITIVE (*)    Leukocytes, UA SMALL (*)    WBC, UA >50 (*)    Bacteria, UA MANY (*)    All other components within normal limits  URINE CULTURE  CBG MONITORING, ED  I-STAT TROPONIN, ED  POC OCCULT BLOOD, ED    EKG EKG Interpretation  Date/Time:  Wednesday November 17 2017 10:04:40 EDT Ventricular Rate:  101 PR Interval:  122 QRS Duration: 72 QT Interval:  380 QTC Calculation: 492 R Axis:   42 Text Interpretation:  Sinus tachycardia with Possible Premature atrial complexes with Abberant conduction Low voltage QRS T wave abnormality, consider inferior ischemia Abnormal ECG Since last tracing rate faster Confirmed by Wandra Arthurs [phone]) on 11/17/2017 3:38:50 PM   Radiology Dg Chest 2 View  Result Date: 11/17/2017 CLINICAL DATA:  Generalized weakness for several months with shortness of breath EXAM: CHEST - 2 VIEW COMPARISON:  October 26, 2017 FINDINGS: The heart size and mediastinal contours are within normal limits. There is no focal infiltrate, pulmonary edema, or pleural effusion. The visualized skeletal structures are stable. There is scoliosis of spine. Prior cholecystectomy clips are identified in the right  quadrant. IMPRESSION: No active cardiopulmonary disease. Electronically Signed   By: Abelardo Diesel M.D.   On: 11/17/2017 13:01    Procedures Procedures (including critical care time)  Medications Ordered in ED Medications  dexamethasone (DECADRON) injection 10  mg (has no administration in time range)  diazepam (VALIUM) tablet 5 mg (5 mg Oral Given 11/17/17 1310)  sodium chloride 0.9 % bolus 1,000 mL (0 mLs Intravenous Paused 11/17/17 1507)  morphine 4 MG/ML injection 4 mg (4 mg Intravenous Given 11/17/17 1308)  cefTRIAXone (ROCEPHIN) 1 g in sodium chloride 0.9 % 100 mL IVPB (0 g Intravenous Stopped 11/17/17 1423)  HYDROmorphone (DILAUDID) injection 0.5 mg (0.5 mg Intravenous Given 11/17/17 1454)  gadobenate dimeglumine (MULTIHANCE) injection 15 mL (15 mLs Intravenous Contrast Given 11/17/17 1615)     Initial Impression / Assessment and Plan / ED Course  I have reviewed the triage vital signs and the nursing notes.  Pertinent labs & imaging results that were available during my care of the patient were reviewed by me and considered in my medical decision making (see chart for details).     Patient presents with several month history of right lower extremity pain and 1.87-monthhistory of dyspnea on exertion, lightheadedness,  and generalized weakness/fatigue.  She is afebrile, initially mildly tachycardic on arrival but this resolved while in the ED.  She is nontoxic in appearance.  No midline spine tenderness but she does have EHL and gastroc diminished strength on examination of the right lower extremity.  She has been complaining of stool incontinence at night only.  She has no reliable PCP at this time but has an appointment scheduled later this month.  Lab work reviewed by me shows no leukocytosis, no anemia.  Mildly elevated creatinine though this appears baseline.  No metabolic derangements.  UA is consistent with UTI, will culture.  Gave patient Rocephin in the ED.  EKG shows faster rate but  otherwise no acute ischemic changes.  Troponin is negative.  Patient is not complaining of chest pain and I doubt ACS/MI at this time.  Patient has intact rectal tone on exam and Hemoccult is negative.  However, with weakness on examination and complaint of stool incontinence, will obtain MRI to evaluate for patient's radicular pain.  She continues to complain of pain despite morphine, Valium, IV fluids.  We will give Dilaudid and Decadron. 4:18 PM Signed out to oncoming provider PA [place].  Awaiting MRI.  Patient likely stable for discharge home with follow-up with PCP.  Will give prescription of Keflex, Tylenol No. 4, and steroid.  I did have a long discussion with the patient regarding the importance of follow-up with a PCP and orthopedist for reevaluation of her radicular pain.  She has a history of prior cervical spine fusion and may require lumbar spine fusion at some point.  Patient seen and evaluated by Dr. SAshok Cordiawho agrees with assessment and plan at this time.  Final Clinical Impressions(s) / ED Diagnoses   Final diagnoses:  Acute cystitis with hematuria  Radicular pain of right lower extremity    ED Discharge Orders        Ordered    cephALEXin (KEFLEX) 500 MG capsule  4 times daily     11/17/17 1531    acetaminophen-codeine (TYLENOL #4) 300-60 MG tablet  Every 6 hours PRN     11/17/17 1531    predniSONE (STERAPRED UNI-PAK 21 TAB) 10 MG (21) TBPK tablet  Daily     11/17/17 1531       FRenita Papa PA-C 11/17/17 1618    SLajean Saver MD 11/19/17 1309

## 2017-11-17 NOTE — ED Notes (Signed)
Pt taken to xray 

## 2017-11-19 LAB — URINE CULTURE: Culture: >=100000 — AB

## 2017-11-20 ENCOUNTER — Telehealth: Payer: Self-pay

## 2017-11-20 NOTE — Telephone Encounter (Signed)
Post ED Visit - Positive Culture Follow-up  Culture report reviewed by antimicrobial stewardship pharmacist:  []  Elenor Quinones, Pharm.D. []  Heide Guile, Pharm.D., BCPS AQ-ID []  Parks Neptune, Pharm.D., BCPS []  Alycia Rossetti, Pharm.D., BCPS []  [place], Pharm.D., BCPS, AAHIVP []  Legrand Como, Pharm.D., BCPS, AAHIVP [x]  Salome Arnt, PharmD, BCPS []  Johnnette Gourd, PharmD, BCPS []  Hughes Better, PharmD, BCPS []  Leeroy Cha, PharmD  Positive urine culture Treated with Cephalexin, organism sensitive to the same and no further patient follow-up is required at this time.  Genia Del 11/20/2017, 9:42 AM

## 2018-04-15 DIAGNOSIS — I10 Essential (primary) hypertension: Secondary | ICD-10-CM | POA: Diagnosis not present

## 2018-04-15 DIAGNOSIS — R079 Chest pain, unspecified: Secondary | ICD-10-CM | POA: Diagnosis not present

## 2018-04-15 DIAGNOSIS — Z9861 Coronary angioplasty status: Secondary | ICD-10-CM | POA: Diagnosis not present

## 2018-04-15 DIAGNOSIS — R0602 Shortness of breath: Secondary | ICD-10-CM | POA: Diagnosis not present

## 2018-04-15 DIAGNOSIS — I2510 Atherosclerotic heart disease of native coronary artery without angina pectoris: Secondary | ICD-10-CM | POA: Diagnosis not present

## 2018-04-19 DIAGNOSIS — R079 Chest pain, unspecified: Secondary | ICD-10-CM | POA: Diagnosis not present

## 2018-04-19 DIAGNOSIS — I2510 Atherosclerotic heart disease of native coronary artery without angina pectoris: Secondary | ICD-10-CM | POA: Diagnosis not present

## 2018-04-19 DIAGNOSIS — I10 Essential (primary) hypertension: Secondary | ICD-10-CM | POA: Diagnosis not present

## 2018-04-19 DIAGNOSIS — R0602 Shortness of breath: Secondary | ICD-10-CM | POA: Diagnosis not present

## 2018-04-19 DIAGNOSIS — Z9861 Coronary angioplasty status: Secondary | ICD-10-CM | POA: Diagnosis not present

## 2018-04-25 ENCOUNTER — Ambulatory Visit: Payer: Self-pay | Admitting: Cardiovascular Disease

## 2018-04-25 DIAGNOSIS — I249 Acute ischemic heart disease, unspecified: Secondary | ICD-10-CM | POA: Insufficient documentation

## 2018-04-25 DIAGNOSIS — I2510 Atherosclerotic heart disease of native coronary artery without angina pectoris: Secondary | ICD-10-CM | POA: Diagnosis not present

## 2018-04-25 HISTORY — DX: Acute ischemic heart disease, unspecified: I24.9

## 2018-05-05 ENCOUNTER — Ambulatory Visit
Admission: RE | Admit: 2018-05-05 | Discharge: 2018-05-05 | Disposition: A | Discharge status: Home / Self Care | Payer: Medicare Other | Attending: Cardiovascular Disease | Admitting: Cardiovascular Disease

## 2018-05-05 ENCOUNTER — Encounter
Admission: RE | Disposition: A | Discharge status: Home / Self Care | Payer: Self-pay | Source: Home / Self Care | Attending: Cardiovascular Disease

## 2018-05-05 ENCOUNTER — Other Ambulatory Visit: Payer: Self-pay

## 2018-05-05 DIAGNOSIS — I739 Peripheral vascular disease, unspecified: Secondary | ICD-10-CM | POA: Diagnosis not present

## 2018-05-05 DIAGNOSIS — K5090 Crohn's disease, unspecified, without complications: Secondary | ICD-10-CM | POA: Insufficient documentation

## 2018-05-05 DIAGNOSIS — Z8249 Family history of ischemic heart disease and other diseases of the circulatory system: Secondary | ICD-10-CM | POA: Diagnosis not present

## 2018-05-05 DIAGNOSIS — I249 Acute ischemic heart disease, unspecified: Secondary | ICD-10-CM

## 2018-05-05 DIAGNOSIS — F17210 Nicotine dependence, cigarettes, uncomplicated: Secondary | ICD-10-CM | POA: Insufficient documentation

## 2018-05-05 DIAGNOSIS — Z79899 Other long term (current) drug therapy: Secondary | ICD-10-CM | POA: Diagnosis not present

## 2018-05-05 DIAGNOSIS — R0602 Shortness of breath: Secondary | ICD-10-CM | POA: Diagnosis not present

## 2018-05-05 DIAGNOSIS — I2510 Atherosclerotic heart disease of native coronary artery without angina pectoris: Secondary | ICD-10-CM | POA: Diagnosis not present

## 2018-05-05 DIAGNOSIS — Z7982 Long term (current) use of aspirin: Secondary | ICD-10-CM | POA: Diagnosis not present

## 2018-05-05 DIAGNOSIS — I200 Unstable angina: Secondary | ICD-10-CM | POA: Diagnosis not present

## 2018-05-05 HISTORY — PX: LEFT HEART CATH AND CORONARY ANGIOGRAPHY: CATH118249

## 2018-05-05 LAB — CBC
HCT: 34.4 % — ABNORMAL LOW (ref 36.0–46.0)
Hemoglobin: 11.6 g/dL — ABNORMAL LOW (ref 12.0–15.0)
MCH: 34.2 pg — ABNORMAL HIGH (ref 26.0–34.0)
MCHC: 33.7 g/dL (ref 30.0–36.0)
MCV: 101.5 fL — ABNORMAL HIGH (ref 80.0–100.0)
Platelets: 383 10*3/uL (ref 150–400)
RBC: 3.39 MIL/uL — ABNORMAL LOW (ref 3.87–5.11)
RDW: 17.6 % — ABNORMAL HIGH (ref 11.5–15.5)
WBC: 3.4 10*3/uL — ABNORMAL LOW (ref 4.0–10.5)
nRBC: 0 % (ref 0.0–0.2)

## 2018-05-05 SURGERY — LEFT HEART CATH AND CORONARY ANGIOGRAPHY
Anesthesia: Moderate Sedation | Laterality: Left

## 2018-05-05 MED ORDER — SODIUM CHLORIDE 0.9 % IV SOLN: 250.0000 mL | INTRAVENOUS | Status: DC | PRN

## 2018-05-05 MED ORDER — SODIUM CHLORIDE 0.9% FLUSH: 3.0000 mL | Freq: Two times a day (BID) | INTRAVENOUS | Status: DC

## 2018-05-05 MED ORDER — ASPIRIN 81 MG PO CHEW
81.0000 mg | CHEWABLE_TABLET | ORAL | Status: AC
  Administered 2018-05-05: 81 mg via ORAL

## 2018-05-05 MED ORDER — SODIUM CHLORIDE 0.9% FLUSH: 3.0000 mL | INTRAVENOUS | Status: DC | PRN

## 2018-05-05 MED ORDER — ACETAMINOPHEN 325 MG PO TABS: 650.0000 mg | ORAL_TABLET | ORAL | Status: DC | PRN

## 2018-05-05 MED ORDER — ASPIRIN 81 MG PO CHEW
CHEWABLE_TABLET | ORAL | Status: AC
  Administered 2018-05-05: 81 mg via ORAL
  Filled 2018-05-05: qty 1

## 2018-05-05 MED ORDER — SODIUM CHLORIDE 0.9 % WEIGHT BASED INFUSION: 1.0000 mL/kg/h | INTRAVENOUS | Status: DC

## 2018-05-05 MED ORDER — FENTANYL CITRATE (PF) 100 MCG/2ML IJ SOLN
INTRAMUSCULAR | Status: AC
  Filled 2018-05-05: qty 2

## 2018-05-05 MED ORDER — FENTANYL CITRATE (PF) 100 MCG/2ML IJ SOLN
INTRAMUSCULAR | Status: DC | PRN
  Administered 2018-05-05: 25 ug via INTRAVENOUS

## 2018-05-05 MED ORDER — IOPAMIDOL (ISOVUE-300) INJECTION 61%
INTRAVENOUS | Status: DC | PRN
  Administered 2018-05-05: 50 mL via INTRA_ARTERIAL

## 2018-05-05 MED ORDER — MIDAZOLAM HCL 2 MG/2ML IJ SOLN
INTRAMUSCULAR | Status: DC | PRN
  Administered 2018-05-05: 1 mg via INTRAVENOUS

## 2018-05-05 MED ORDER — HEPARIN (PORCINE) IN NACL 1000-0.9 UT/500ML-% IV SOLN
INTRAVENOUS | Status: DC | PRN
  Administered 2018-05-05: 500 mL

## 2018-05-05 MED ORDER — MIDAZOLAM HCL 2 MG/2ML IJ SOLN
INTRAMUSCULAR | Status: AC
  Filled 2018-05-05: qty 2

## 2018-05-05 MED ORDER — SODIUM CHLORIDE 0.9 % WEIGHT BASED INFUSION
3.0000 mL/kg/h | INTRAVENOUS | Status: AC
  Administered 2018-05-05: 3 mL/kg/h via INTRAVENOUS

## 2018-05-05 MED ORDER — ONDANSETRON HCL 4 MG/2ML IJ SOLN: 4.0000 mg | Freq: Four times a day (QID) | INTRAMUSCULAR | Status: DC | PRN

## 2018-05-05 MED ORDER — HEPARIN (PORCINE) IN NACL 1000-0.9 UT/500ML-% IV SOLN
INTRAVENOUS | Status: AC
  Filled 2018-05-05: qty 1000

## 2018-05-05 SURGICAL SUPPLY — 8 items
CATH INFINITI 5FR ANG PIGTAIL (CATHETERS) ×3 IMPLANT
CATH INFINITI 5FR JL4 (CATHETERS) ×3 IMPLANT
CATH INFINITI JR4 5F (CATHETERS) ×3 IMPLANT
KIT MANI 3VAL PERCEP (MISCELLANEOUS) ×3 IMPLANT
NEEDLE PERC 18GX7CM (NEEDLE) ×3 IMPLANT
PACK CARDIAC CATH (CUSTOM PROCEDURE TRAY) ×3 IMPLANT
SHEATH AVANTI 5FR X 11CM (SHEATH) ×3 IMPLANT
WIRE GUIDERIGHT .035X150 (WIRE) ×3 IMPLANT

## 2018-05-05 NOTE — Discharge Instructions (Signed)
Groin Insertion Instructions-If you lose feeling or develop tingling or pain in your leg or foot after the procedure, please walk around first.  If the discomfort does not improve , contact your physician and proceed to the nearest emergency room.  Loss of feeling in your leg might mean that a blockage has formed in the artery and this can be appropriately treated.  Limit your activity for the next two days after your procedure.  Avoid stooping, bending, heavy lifting or exertion as this may put pressure on the insertion site.  Resume normal activities in 48 hours.  You may shower after 24 hours but avoid excessive warm water and do not scrub the site.  Remove clear dressing in 48 hours.  If you have had a closure device inserted, do not soak in a tub bath or a hot tub for at least one week.  No driving for 48 hours after discharge.  After the procedure, check the insertion site occasionally.  If any oozing occurs or there is apparent swelling, firm pressure over the site will prevent a bruise from forming.  You can not hurt anything by pressing directly on the site.  The pressure stops the bleeding by allowing a small clot to form.  If the bleeding continues after the pressure has been applied for more than 15 minutes, call 911 or go to the nearest emergency room.    The x-ray dye causes you to pass a considerate amount of urine.  For this reason, you will be asked to drink plenty of liquids after the procedure to prevent dehydration.  You may resume you regular diet.  Avoid caffeine products.    For pain at the site of your procedure, take non-aspirin medicines such as Tylenol.  Medications: A. Hold Metformin for 48 hours if applicable.  B. Continue taking all your present medications at home unless your doctor prescribes any changes   Angiogram, Care After This sheet gives you information about how to care for yourself after your procedure. Your health care provider may also give you more specific  instructions. If you have problems or questions, contact your health care provider. What can I expect after the procedure? After the procedure, it is common to have bruising and tenderness at the catheter insertion area. Follow these instructions at home: Insertion site care  Follow instructions from your health care provider about how to take care of your insertion site. Make sure you: ? Wash your hands with soap and water before you change your bandage (dressing). If soap and water are not available, use hand sanitizer. ? Change your dressing as told by your health care provider. ? Leave stitches (sutures), skin glue, or adhesive strips in place. These skin closures may need to stay in place for 2 weeks or longer. If adhesive strip edges start to loosen and curl up, you may trim the loose edges. Do not remove adhesive strips completely unless your health care provider tells you to do that.  Do not take baths, swim, or use a hot tub until your health care provider approves.  You may shower 24-48 hours after the procedure or as told by your health care provider. ? Gently wash the site with plain soap and water. ? Pat the area dry with a clean towel. ? Do not rub the site. This may cause bleeding.  Do not apply powder or lotion to the site. Keep the site clean and dry.  Check your insertion site every day for signs of  infection. Check for: ? Redness, swelling, or pain. ? Fluid or blood. ? Warmth. ? Pus or a bad smell. Activity  Rest as told by your health care provider, usually for 1-2 days.  Do not lift anything that is heavier than 10 lbs. (4.5 kg) or as told by your health care provider.  Do not drive for 24 hours if you were given a medicine to help you relax (sedative).  Do not drive or use heavy machinery while taking prescription pain medicine. General instructions   Return to your normal activities as told by your health care provider, usually in about a week. Ask your  health care provider what activities are safe for you.  If the catheter site starts bleeding, lie flat and put pressure on the site. If the bleeding does not stop, get help right away. This is a medical emergency.  Drink enough fluid to keep your urine clear or pale yellow. This helps flush the contrast dye from your body.  Take over-the-counter and prescription medicines only as told by your health care provider.  Keep all follow-up visits as told by your health care provider. This is important. Contact a health care provider if:  You have a fever or chills.  You have redness, swelling, or pain around your insertion site.  You have fluid or blood coming from your insertion site.  The insertion site feels warm to the touch.  You have pus or a bad smell coming from your insertion site.  You have bruising around the insertion site.  You notice blood collecting in the tissue around the catheter site (hematoma). The hematoma may be painful to the touch. Get help right away if:  You have severe pain at the catheter insertion area.  The catheter insertion area swells very fast.  The catheter insertion area is bleeding, and the bleeding does not stop when you hold steady pressure on the area.  The area near or just beyond the catheter insertion site becomes pale, cool, tingly, or numb. These symptoms may represent a serious problem that is an emergency. Do not wait to see if the symptoms will go away. Get medical help right away. Call your local emergency services (911 in the [place] Do not drive yourself to the hospital. Summary  After the procedure, it is common to have bruising and tenderness at the catheter insertion area.  After the procedure, it is important to rest and drink plenty of fluids.  Do not take baths, swim, or use a hot tub until your health care provider says it is okay to do so. You may shower 24-48 hours after the procedure or as told by your health care  provider.  If the catheter site starts bleeding, lie flat and put pressure on the site. If the bleeding does not stop, get help right away. This is a medical emergency. This information is not intended to replace advice given to you by your health care provider. Make sure you discuss any questions you have with your health care provider. Document Released: 10/16/2004 Document Revised: 03/04/2016 Document Reviewed: 03/04/2016 Elsevier Interactive Patient Education  2019 [employer].

## 2018-05-09 ENCOUNTER — Other Ambulatory Visit: Payer: Self-pay | Admitting: Registered Nurse

## 2018-05-09 DIAGNOSIS — Z9861 Coronary angioplasty status: Secondary | ICD-10-CM | POA: Diagnosis not present

## 2018-05-09 DIAGNOSIS — I2510 Atherosclerotic heart disease of native coronary artery without angina pectoris: Secondary | ICD-10-CM | POA: Diagnosis not present

## 2018-05-09 DIAGNOSIS — I739 Peripheral vascular disease, unspecified: Secondary | ICD-10-CM | POA: Diagnosis not present

## 2018-06-10 DIAGNOSIS — K5090 Crohn's disease, unspecified, without complications: Secondary | ICD-10-CM | POA: Diagnosis not present

## 2018-06-10 DIAGNOSIS — M5127 Other intervertebral disc displacement, lumbosacral region: Secondary | ICD-10-CM | POA: Diagnosis not present

## 2018-06-10 DIAGNOSIS — J189 Pneumonia, unspecified organism: Secondary | ICD-10-CM | POA: Diagnosis not present

## 2018-06-10 DIAGNOSIS — I2510 Atherosclerotic heart disease of native coronary artery without angina pectoris: Secondary | ICD-10-CM | POA: Diagnosis not present

## 2018-06-10 DIAGNOSIS — M5416 Radiculopathy, lumbar region: Secondary | ICD-10-CM | POA: Diagnosis not present

## 2018-06-10 DIAGNOSIS — I739 Peripheral vascular disease, unspecified: Secondary | ICD-10-CM | POA: Diagnosis not present

## 2018-06-29 DIAGNOSIS — I2510 Atherosclerotic heart disease of native coronary artery without angina pectoris: Secondary | ICD-10-CM | POA: Diagnosis not present

## 2018-07-04 ENCOUNTER — Other Ambulatory Visit: Payer: Self-pay

## 2018-07-04 ENCOUNTER — Ambulatory Visit
Admission: RE | Admit: 2018-07-04 | Discharge: 2018-07-04 | Disposition: A | Discharge status: Home / Self Care | Payer: Medicare Other | Source: Ambulatory Visit | Attending: Family Medicine | Admitting: Family Medicine

## 2018-07-04 ENCOUNTER — Other Ambulatory Visit: Payer: Self-pay | Admitting: Family Medicine

## 2018-07-04 DIAGNOSIS — W0110XA Fall on same level from slipping, tripping and stumbling with subsequent striking against unspecified object, initial encounter: Secondary | ICD-10-CM | POA: Diagnosis not present

## 2018-07-04 DIAGNOSIS — S299XXA Unspecified injury of thorax, initial encounter: Secondary | ICD-10-CM

## 2018-07-04 DIAGNOSIS — N189 Chronic kidney disease, unspecified: Secondary | ICD-10-CM | POA: Diagnosis not present

## 2018-07-04 DIAGNOSIS — E782 Mixed hyperlipidemia: Secondary | ICD-10-CM | POA: Diagnosis not present

## 2018-07-04 DIAGNOSIS — Z6821 Body mass index (BMI) 21.0-21.9, adult: Secondary | ICD-10-CM | POA: Diagnosis not present

## 2018-07-04 DIAGNOSIS — R7303 Prediabetes: Secondary | ICD-10-CM | POA: Diagnosis not present

## 2018-07-04 DIAGNOSIS — R309 Painful micturition, unspecified: Secondary | ICD-10-CM | POA: Diagnosis not present

## 2018-07-04 DIAGNOSIS — R079 Chest pain, unspecified: Secondary | ICD-10-CM | POA: Diagnosis not present

## 2018-07-04 DIAGNOSIS — Z8673 Personal history of transient ischemic attack (TIA), and cerebral infarction without residual deficits: Secondary | ICD-10-CM | POA: Diagnosis not present

## 2018-07-08 ENCOUNTER — Other Ambulatory Visit: Payer: Self-pay | Admitting: Cardiology

## 2018-07-25 ENCOUNTER — Other Ambulatory Visit (HOSPITAL_COMMUNITY)
Admission: RE | Admit: 2018-07-25 | Discharge: 2018-07-25 | Disposition: A | Discharge status: Home / Self Care | Payer: BLUE CROSS/BLUE SHIELD | Source: Ambulatory Visit | Attending: Nurse Practitioner | Admitting: Nurse Practitioner

## 2018-07-25 ENCOUNTER — Other Ambulatory Visit: Payer: Self-pay

## 2018-07-25 DIAGNOSIS — Z1272 Encounter for screening for malignant neoplasm of vagina: Secondary | ICD-10-CM | POA: Diagnosis not present

## 2018-07-25 DIAGNOSIS — Z1151 Encounter for screening for human papillomavirus (HPV): Secondary | ICD-10-CM | POA: Insufficient documentation

## 2018-07-25 DIAGNOSIS — L02224 Furuncle of groin: Secondary | ICD-10-CM | POA: Diagnosis not present

## 2018-07-25 DIAGNOSIS — Z124 Encounter for screening for malignant neoplasm of cervix: Secondary | ICD-10-CM | POA: Diagnosis present

## 2018-07-25 DIAGNOSIS — N390 Urinary tract infection, site not specified: Secondary | ICD-10-CM | POA: Diagnosis not present

## 2018-07-30 LAB — CYTOLOGY - PAP
Adequacy: ABSENT
Diagnosis: NEGATIVE
HPV: NOT DETECTED

## 2018-08-19 DIAGNOSIS — M5416 Radiculopathy, lumbar region: Secondary | ICD-10-CM | POA: Diagnosis not present

## 2018-08-19 DIAGNOSIS — N183 Chronic kidney disease, stage 3 (moderate): Secondary | ICD-10-CM | POA: Diagnosis not present

## 2018-08-19 DIAGNOSIS — I2510 Atherosclerotic heart disease of native coronary artery without angina pectoris: Secondary | ICD-10-CM | POA: Diagnosis not present

## 2018-08-19 DIAGNOSIS — I739 Peripheral vascular disease, unspecified: Secondary | ICD-10-CM | POA: Diagnosis not present

## 2018-08-19 DIAGNOSIS — M5127 Other intervertebral disc displacement, lumbosacral region: Secondary | ICD-10-CM | POA: Diagnosis not present

## 2018-08-19 DIAGNOSIS — K5090 Crohn's disease, unspecified, without complications: Secondary | ICD-10-CM | POA: Diagnosis not present

## 2018-09-16 DIAGNOSIS — R42 Dizziness and giddiness: Secondary | ICD-10-CM | POA: Diagnosis not present

## 2018-09-16 DIAGNOSIS — I2510 Atherosclerotic heart disease of native coronary artery without angina pectoris: Secondary | ICD-10-CM | POA: Diagnosis not present

## 2018-09-16 DIAGNOSIS — I959 Hypotension, unspecified: Secondary | ICD-10-CM | POA: Diagnosis not present

## 2018-09-16 DIAGNOSIS — F17210 Nicotine dependence, cigarettes, uncomplicated: Secondary | ICD-10-CM | POA: Diagnosis not present

## 2018-09-16 DIAGNOSIS — Z9861 Coronary angioplasty status: Secondary | ICD-10-CM | POA: Diagnosis not present

## 2018-09-16 DIAGNOSIS — I10 Essential (primary) hypertension: Secondary | ICD-10-CM | POA: Diagnosis not present

## 2018-09-16 DIAGNOSIS — R0602 Shortness of breath: Secondary | ICD-10-CM | POA: Diagnosis not present

## 2018-09-22 DIAGNOSIS — R079 Chest pain, unspecified: Secondary | ICD-10-CM | POA: Diagnosis not present

## 2018-09-26 DIAGNOSIS — I130 Hypertensive heart and chronic kidney disease with heart failure and stage 1 through stage 4 chronic kidney disease, or unspecified chronic kidney disease: Secondary | ICD-10-CM | POA: Diagnosis not present

## 2018-09-30 DIAGNOSIS — I2510 Atherosclerotic heart disease of native coronary artery without angina pectoris: Secondary | ICD-10-CM | POA: Diagnosis not present

## 2018-09-30 DIAGNOSIS — I10 Essential (primary) hypertension: Secondary | ICD-10-CM | POA: Diagnosis not present

## 2018-09-30 DIAGNOSIS — R0602 Shortness of breath: Secondary | ICD-10-CM | POA: Diagnosis not present

## 2018-09-30 DIAGNOSIS — I951 Orthostatic hypotension: Secondary | ICD-10-CM | POA: Diagnosis not present

## 2018-09-30 DIAGNOSIS — I6529 Occlusion and stenosis of unspecified carotid artery: Secondary | ICD-10-CM | POA: Diagnosis not present

## 2018-09-30 DIAGNOSIS — Z9861 Coronary angioplasty status: Secondary | ICD-10-CM | POA: Diagnosis not present

## 2018-09-30 DIAGNOSIS — I739 Peripheral vascular disease, unspecified: Secondary | ICD-10-CM | POA: Diagnosis not present

## 2018-09-30 DIAGNOSIS — R42 Dizziness and giddiness: Secondary | ICD-10-CM | POA: Diagnosis not present

## 2018-10-05 DIAGNOSIS — I6529 Occlusion and stenosis of unspecified carotid artery: Secondary | ICD-10-CM | POA: Diagnosis not present

## 2018-10-07 DIAGNOSIS — Z1231 Encounter for screening mammogram for malignant neoplasm of breast: Secondary | ICD-10-CM | POA: Diagnosis not present

## 2018-10-07 DIAGNOSIS — Z853 Personal history of malignant neoplasm of breast: Secondary | ICD-10-CM | POA: Diagnosis not present

## 2018-10-19 DIAGNOSIS — M5127 Other intervertebral disc displacement, lumbosacral region: Secondary | ICD-10-CM | POA: Diagnosis not present

## 2018-10-19 DIAGNOSIS — K5090 Crohn's disease, unspecified, without complications: Secondary | ICD-10-CM | POA: Diagnosis not present

## 2018-10-19 DIAGNOSIS — N183 Chronic kidney disease, stage 3 (moderate): Secondary | ICD-10-CM | POA: Diagnosis not present

## 2018-10-19 DIAGNOSIS — I739 Peripheral vascular disease, unspecified: Secondary | ICD-10-CM | POA: Diagnosis not present

## 2018-10-19 DIAGNOSIS — I2510 Atherosclerotic heart disease of native coronary artery without angina pectoris: Secondary | ICD-10-CM | POA: Diagnosis not present

## 2018-10-19 DIAGNOSIS — R197 Diarrhea, unspecified: Secondary | ICD-10-CM | POA: Diagnosis not present

## 2018-10-19 DIAGNOSIS — M5416 Radiculopathy, lumbar region: Secondary | ICD-10-CM | POA: Diagnosis not present

## 2018-10-20 DIAGNOSIS — I6529 Occlusion and stenosis of unspecified carotid artery: Secondary | ICD-10-CM | POA: Diagnosis not present

## 2018-11-21 DIAGNOSIS — H40033 Anatomical narrow angle, bilateral: Secondary | ICD-10-CM | POA: Diagnosis not present

## 2018-12-07 DIAGNOSIS — Z9861 Coronary angioplasty status: Secondary | ICD-10-CM | POA: Diagnosis not present

## 2018-12-07 DIAGNOSIS — I2510 Atherosclerotic heart disease of native coronary artery without angina pectoris: Secondary | ICD-10-CM | POA: Diagnosis not present

## 2018-12-07 DIAGNOSIS — N183 Chronic kidney disease, stage 3 (moderate): Secondary | ICD-10-CM | POA: Diagnosis not present

## 2018-12-12 DIAGNOSIS — H40031 Anatomical narrow angle, right eye: Secondary | ICD-10-CM | POA: Diagnosis not present

## 2018-12-23 DIAGNOSIS — M5127 Other intervertebral disc displacement, lumbosacral region: Secondary | ICD-10-CM | POA: Diagnosis not present

## 2018-12-23 DIAGNOSIS — I739 Peripheral vascular disease, unspecified: Secondary | ICD-10-CM | POA: Diagnosis not present

## 2018-12-23 DIAGNOSIS — I2510 Atherosclerotic heart disease of native coronary artery without angina pectoris: Secondary | ICD-10-CM | POA: Diagnosis not present

## 2018-12-23 DIAGNOSIS — N183 Chronic kidney disease, stage 3 (moderate): Secondary | ICD-10-CM | POA: Diagnosis not present

## 2018-12-23 DIAGNOSIS — R197 Diarrhea, unspecified: Secondary | ICD-10-CM | POA: Diagnosis not present

## 2018-12-23 DIAGNOSIS — K5090 Crohn's disease, unspecified, without complications: Secondary | ICD-10-CM | POA: Diagnosis not present

## 2018-12-23 DIAGNOSIS — M5416 Radiculopathy, lumbar region: Secondary | ICD-10-CM | POA: Diagnosis not present

## 2019-02-07 DIAGNOSIS — N951 Menopausal and female climacteric states: Secondary | ICD-10-CM | POA: Diagnosis not present

## 2019-03-06 DIAGNOSIS — E559 Vitamin D deficiency, unspecified: Secondary | ICD-10-CM | POA: Diagnosis not present

## 2019-03-06 DIAGNOSIS — K5090 Crohn's disease, unspecified, without complications: Secondary | ICD-10-CM | POA: Diagnosis not present

## 2019-03-06 DIAGNOSIS — I10 Essential (primary) hypertension: Secondary | ICD-10-CM | POA: Diagnosis not present

## 2019-03-06 DIAGNOSIS — Z9861 Coronary angioplasty status: Secondary | ICD-10-CM | POA: Diagnosis not present

## 2019-03-06 DIAGNOSIS — N183 Chronic kidney disease, stage 3 unspecified: Secondary | ICD-10-CM | POA: Diagnosis not present

## 2019-03-10 DIAGNOSIS — I2510 Atherosclerotic heart disease of native coronary artery without angina pectoris: Secondary | ICD-10-CM | POA: Diagnosis not present

## 2019-03-10 DIAGNOSIS — I739 Peripheral vascular disease, unspecified: Secondary | ICD-10-CM | POA: Diagnosis not present

## 2019-03-10 DIAGNOSIS — N189 Chronic kidney disease, unspecified: Secondary | ICD-10-CM | POA: Diagnosis not present

## 2019-03-10 DIAGNOSIS — Z1331 Encounter for screening for depression: Secondary | ICD-10-CM | POA: Diagnosis not present

## 2019-03-10 DIAGNOSIS — M5416 Radiculopathy, lumbar region: Secondary | ICD-10-CM | POA: Diagnosis not present

## 2019-03-10 DIAGNOSIS — Z9861 Coronary angioplasty status: Secondary | ICD-10-CM | POA: Diagnosis not present

## 2019-03-10 DIAGNOSIS — K5090 Crohn's disease, unspecified, without complications: Secondary | ICD-10-CM | POA: Diagnosis not present

## 2019-03-10 DIAGNOSIS — M5127 Other intervertebral disc displacement, lumbosacral region: Secondary | ICD-10-CM | POA: Diagnosis not present

## 2019-03-10 DIAGNOSIS — Z0001 Encounter for general adult medical examination with abnormal findings: Secondary | ICD-10-CM | POA: Diagnosis not present

## 2019-03-10 DIAGNOSIS — F17210 Nicotine dependence, cigarettes, uncomplicated: Secondary | ICD-10-CM | POA: Diagnosis not present

## 2019-03-10 DIAGNOSIS — Z87891 Personal history of nicotine dependence: Secondary | ICD-10-CM | POA: Diagnosis not present

## 2019-03-10 DIAGNOSIS — R197 Diarrhea, unspecified: Secondary | ICD-10-CM | POA: Diagnosis not present

## 2019-03-10 DIAGNOSIS — Z5189 Encounter for other specified aftercare: Secondary | ICD-10-CM | POA: Diagnosis not present

## 2019-03-10 DIAGNOSIS — Z1389 Encounter for screening for other disorder: Secondary | ICD-10-CM | POA: Diagnosis not present

## 2019-03-10 DIAGNOSIS — Z136 Encounter for screening for cardiovascular disorders: Secondary | ICD-10-CM | POA: Diagnosis not present

## 2019-06-16 DIAGNOSIS — F17200 Nicotine dependence, unspecified, uncomplicated: Secondary | ICD-10-CM | POA: Diagnosis not present

## 2019-06-16 DIAGNOSIS — I2510 Atherosclerotic heart disease of native coronary artery without angina pectoris: Secondary | ICD-10-CM | POA: Diagnosis not present

## 2019-06-16 DIAGNOSIS — I10 Essential (primary) hypertension: Secondary | ICD-10-CM | POA: Diagnosis not present

## 2019-06-16 DIAGNOSIS — K5090 Crohn's disease, unspecified, without complications: Secondary | ICD-10-CM | POA: Diagnosis not present

## 2019-06-16 DIAGNOSIS — I739 Peripheral vascular disease, unspecified: Secondary | ICD-10-CM | POA: Diagnosis not present

## 2019-06-16 DIAGNOSIS — R197 Diarrhea, unspecified: Secondary | ICD-10-CM | POA: Diagnosis not present

## 2019-06-16 DIAGNOSIS — M5127 Other intervertebral disc displacement, lumbosacral region: Secondary | ICD-10-CM | POA: Diagnosis not present

## 2019-06-16 DIAGNOSIS — N189 Chronic kidney disease, unspecified: Secondary | ICD-10-CM | POA: Diagnosis not present

## 2019-06-16 DIAGNOSIS — M5416 Radiculopathy, lumbar region: Secondary | ICD-10-CM | POA: Diagnosis not present

## 2019-06-16 DIAGNOSIS — Z9861 Coronary angioplasty status: Secondary | ICD-10-CM | POA: Diagnosis not present

## 2019-06-16 DIAGNOSIS — F17210 Nicotine dependence, cigarettes, uncomplicated: Secondary | ICD-10-CM | POA: Diagnosis not present

## 2019-06-26 DIAGNOSIS — Z9861 Coronary angioplasty status: Secondary | ICD-10-CM | POA: Diagnosis not present

## 2019-06-26 DIAGNOSIS — I10 Essential (primary) hypertension: Secondary | ICD-10-CM | POA: Diagnosis not present

## 2019-06-26 DIAGNOSIS — I2510 Atherosclerotic heart disease of native coronary artery without angina pectoris: Secondary | ICD-10-CM | POA: Diagnosis not present

## 2019-07-07 DIAGNOSIS — K5090 Crohn's disease, unspecified, without complications: Secondary | ICD-10-CM | POA: Diagnosis not present

## 2019-07-07 DIAGNOSIS — F17200 Nicotine dependence, unspecified, uncomplicated: Secondary | ICD-10-CM | POA: Diagnosis not present

## 2019-07-07 DIAGNOSIS — M5127 Other intervertebral disc displacement, lumbosacral region: Secondary | ICD-10-CM | POA: Diagnosis not present

## 2019-07-07 DIAGNOSIS — I2510 Atherosclerotic heart disease of native coronary artery without angina pectoris: Secondary | ICD-10-CM | POA: Diagnosis not present

## 2019-07-07 DIAGNOSIS — I340 Nonrheumatic mitral (valve) insufficiency: Secondary | ICD-10-CM | POA: Diagnosis not present

## 2019-07-07 DIAGNOSIS — F17210 Nicotine dependence, cigarettes, uncomplicated: Secondary | ICD-10-CM | POA: Diagnosis not present

## 2019-07-07 DIAGNOSIS — I10 Essential (primary) hypertension: Secondary | ICD-10-CM | POA: Diagnosis not present

## 2019-07-07 DIAGNOSIS — M5416 Radiculopathy, lumbar region: Secondary | ICD-10-CM | POA: Diagnosis not present

## 2019-07-07 DIAGNOSIS — R0602 Shortness of breath: Secondary | ICD-10-CM | POA: Diagnosis not present

## 2019-07-07 DIAGNOSIS — I739 Peripheral vascular disease, unspecified: Secondary | ICD-10-CM | POA: Diagnosis not present

## 2019-07-07 DIAGNOSIS — N390 Urinary tract infection, site not specified: Secondary | ICD-10-CM | POA: Diagnosis not present

## 2019-07-07 DIAGNOSIS — R197 Diarrhea, unspecified: Secondary | ICD-10-CM | POA: Diagnosis not present

## 2019-07-07 DIAGNOSIS — N189 Chronic kidney disease, unspecified: Secondary | ICD-10-CM | POA: Diagnosis not present

## 2019-07-07 DIAGNOSIS — E782 Mixed hyperlipidemia: Secondary | ICD-10-CM | POA: Diagnosis not present

## 2019-07-18 DIAGNOSIS — I2510 Atherosclerotic heart disease of native coronary artery without angina pectoris: Secondary | ICD-10-CM | POA: Diagnosis not present

## 2019-07-21 DIAGNOSIS — F17200 Nicotine dependence, unspecified, uncomplicated: Secondary | ICD-10-CM | POA: Diagnosis not present

## 2019-07-21 DIAGNOSIS — F17210 Nicotine dependence, cigarettes, uncomplicated: Secondary | ICD-10-CM | POA: Diagnosis not present

## 2019-07-21 DIAGNOSIS — I10 Essential (primary) hypertension: Secondary | ICD-10-CM | POA: Diagnosis not present

## 2019-07-21 DIAGNOSIS — I2510 Atherosclerotic heart disease of native coronary artery without angina pectoris: Secondary | ICD-10-CM | POA: Diagnosis not present

## 2019-07-21 DIAGNOSIS — K5090 Crohn's disease, unspecified, without complications: Secondary | ICD-10-CM | POA: Diagnosis not present

## 2019-07-21 DIAGNOSIS — Z9861 Coronary angioplasty status: Secondary | ICD-10-CM | POA: Diagnosis not present

## 2019-08-04 ENCOUNTER — Other Ambulatory Visit: Payer: Self-pay

## 2019-08-04 ENCOUNTER — Observation Stay (HOSPITAL_COMMUNITY): Payer: Medicare Other

## 2019-08-04 ENCOUNTER — Emergency Department (HOSPITAL_COMMUNITY): Payer: Medicare Other

## 2019-08-04 ENCOUNTER — Inpatient Hospital Stay (HOSPITAL_COMMUNITY)
Admission: EM | Admit: 2019-08-04 | Discharge: 2019-08-12 | DRG: 987 | Disposition: A | Discharge status: Home Health | Payer: Medicare Other | Attending: Internal Medicine | Admitting: Internal Medicine

## 2019-08-04 ENCOUNTER — Encounter (HOSPITAL_COMMUNITY): Payer: Self-pay

## 2019-08-04 DIAGNOSIS — Z955 Presence of coronary angioplasty implant and graft: Secondary | ICD-10-CM | POA: Diagnosis not present

## 2019-08-04 DIAGNOSIS — R7401 Elevation of levels of liver transaminase levels: Secondary | ICD-10-CM | POA: Diagnosis not present

## 2019-08-04 DIAGNOSIS — R748 Abnormal levels of other serum enzymes: Secondary | ICD-10-CM | POA: Diagnosis not present

## 2019-08-04 DIAGNOSIS — N281 Cyst of kidney, acquired: Secondary | ICD-10-CM | POA: Diagnosis not present

## 2019-08-04 DIAGNOSIS — N189 Chronic kidney disease, unspecified: Secondary | ICD-10-CM | POA: Diagnosis present

## 2019-08-04 DIAGNOSIS — M879 Osteonecrosis, unspecified: Secondary | ICD-10-CM | POA: Diagnosis present

## 2019-08-04 DIAGNOSIS — F17210 Nicotine dependence, cigarettes, uncomplicated: Secondary | ICD-10-CM | POA: Diagnosis present

## 2019-08-04 DIAGNOSIS — E440 Moderate protein-calorie malnutrition: Secondary | ICD-10-CM | POA: Diagnosis present

## 2019-08-04 DIAGNOSIS — E878 Other disorders of electrolyte and fluid balance, not elsewhere classified: Secondary | ICD-10-CM | POA: Diagnosis present

## 2019-08-04 DIAGNOSIS — M3313 Other dermatomyositis without myopathy: Secondary | ICD-10-CM | POA: Diagnosis not present

## 2019-08-04 DIAGNOSIS — D72829 Elevated white blood cell count, unspecified: Secondary | ICD-10-CM

## 2019-08-04 DIAGNOSIS — I2510 Atherosclerotic heart disease of native coronary artery without angina pectoris: Secondary | ICD-10-CM | POA: Diagnosis present

## 2019-08-04 DIAGNOSIS — Z20822 Contact with and (suspected) exposure to covid-19: Secondary | ICD-10-CM | POA: Diagnosis not present

## 2019-08-04 DIAGNOSIS — K5010 Crohn's disease of large intestine without complications: Principal | ICD-10-CM | POA: Diagnosis present

## 2019-08-04 DIAGNOSIS — N2589 Other disorders resulting from impaired renal tubular function: Secondary | ICD-10-CM | POA: Diagnosis present

## 2019-08-04 DIAGNOSIS — N179 Acute kidney failure, unspecified: Secondary | ICD-10-CM | POA: Diagnosis present

## 2019-08-04 DIAGNOSIS — K754 Autoimmune hepatitis: Secondary | ICD-10-CM | POA: Diagnosis present

## 2019-08-04 DIAGNOSIS — K5090 Crohn's disease, unspecified, without complications: Secondary | ICD-10-CM | POA: Diagnosis not present

## 2019-08-04 DIAGNOSIS — E883 Tumor lysis syndrome: Secondary | ICD-10-CM | POA: Diagnosis present

## 2019-08-04 DIAGNOSIS — M5126 Other intervertebral disc displacement, lumbar region: Secondary | ICD-10-CM | POA: Diagnosis not present

## 2019-08-04 DIAGNOSIS — R031 Nonspecific low blood-pressure reading: Secondary | ICD-10-CM | POA: Diagnosis not present

## 2019-08-04 DIAGNOSIS — K769 Liver disease, unspecified: Secondary | ICD-10-CM | POA: Diagnosis not present

## 2019-08-04 DIAGNOSIS — E875 Hyperkalemia: Secondary | ICD-10-CM | POA: Diagnosis not present

## 2019-08-04 DIAGNOSIS — R945 Abnormal results of liver function studies: Secondary | ICD-10-CM | POA: Diagnosis not present

## 2019-08-04 DIAGNOSIS — M5117 Intervertebral disc disorders with radiculopathy, lumbosacral region: Secondary | ICD-10-CM | POA: Diagnosis present

## 2019-08-04 DIAGNOSIS — I493 Ventricular premature depolarization: Secondary | ICD-10-CM | POA: Diagnosis present

## 2019-08-04 DIAGNOSIS — E876 Hypokalemia: Secondary | ICD-10-CM | POA: Diagnosis not present

## 2019-08-04 DIAGNOSIS — I252 Old myocardial infarction: Secondary | ICD-10-CM | POA: Diagnosis not present

## 2019-08-04 DIAGNOSIS — M5416 Radiculopathy, lumbar region: Secondary | ICD-10-CM | POA: Diagnosis not present

## 2019-08-04 DIAGNOSIS — M87851 Other osteonecrosis, right femur: Secondary | ICD-10-CM | POA: Diagnosis not present

## 2019-08-04 DIAGNOSIS — Z6822 Body mass index (BMI) 22.0-22.9, adult: Secondary | ICD-10-CM

## 2019-08-04 DIAGNOSIS — R932 Abnormal findings on diagnostic imaging of liver and biliary tract: Secondary | ICD-10-CM | POA: Diagnosis not present

## 2019-08-04 DIAGNOSIS — Z8249 Family history of ischemic heart disease and other diseases of the circulatory system: Secondary | ICD-10-CM | POA: Diagnosis not present

## 2019-08-04 DIAGNOSIS — Z981 Arthrodesis status: Secondary | ICD-10-CM

## 2019-08-04 DIAGNOSIS — R531 Weakness: Secondary | ICD-10-CM | POA: Diagnosis not present

## 2019-08-04 DIAGNOSIS — R262 Difficulty in walking, not elsewhere classified: Secondary | ICD-10-CM | POA: Diagnosis present

## 2019-08-04 DIAGNOSIS — Z7952 Long term (current) use of systemic steroids: Secondary | ICD-10-CM

## 2019-08-04 DIAGNOSIS — I4581 Long QT syndrome: Secondary | ICD-10-CM | POA: Diagnosis not present

## 2019-08-04 DIAGNOSIS — E872 Acidosis: Secondary | ICD-10-CM | POA: Diagnosis present

## 2019-08-04 DIAGNOSIS — M609 Myositis, unspecified: Secondary | ICD-10-CM | POA: Diagnosis not present

## 2019-08-04 DIAGNOSIS — E43 Unspecified severe protein-calorie malnutrition: Secondary | ICD-10-CM | POA: Insufficient documentation

## 2019-08-04 DIAGNOSIS — M48061 Spinal stenosis, lumbar region without neurogenic claudication: Secondary | ICD-10-CM | POA: Diagnosis not present

## 2019-08-04 DIAGNOSIS — D1803 Hemangioma of intra-abdominal structures: Secondary | ICD-10-CM | POA: Diagnosis not present

## 2019-08-04 DIAGNOSIS — R9431 Abnormal electrocardiogram [ECG] [EKG]: Secondary | ICD-10-CM

## 2019-08-04 DIAGNOSIS — E873 Alkalosis: Secondary | ICD-10-CM

## 2019-08-04 DIAGNOSIS — Z79899 Other long term (current) drug therapy: Secondary | ICD-10-CM

## 2019-08-04 DIAGNOSIS — Z7982 Long term (current) use of aspirin: Secondary | ICD-10-CM

## 2019-08-04 DIAGNOSIS — M6281 Muscle weakness (generalized): Secondary | ICD-10-CM | POA: Diagnosis not present

## 2019-08-04 DIAGNOSIS — M87051 Idiopathic aseptic necrosis of right femur: Secondary | ICD-10-CM | POA: Diagnosis not present

## 2019-08-04 HISTORY — DX: Atherosclerotic heart disease of native coronary artery without angina pectoris: I25.10

## 2019-08-04 HISTORY — DX: Hypokalemia: E87.6

## 2019-08-04 LAB — BASIC METABOLIC PANEL WITH GFR
Anion gap: 13 (ref 5–15)
BUN: 24 mg/dL — ABNORMAL HIGH (ref 8–23)
CO2: 19 mmol/L — ABNORMAL LOW (ref 22–32)
Calcium: 9.4 mg/dL (ref 8.9–10.3)
Chloride: 108 mmol/L (ref 98–111)
Creatinine, Ser: 1.71 mg/dL — ABNORMAL HIGH (ref 0.44–1.00)
GFR calc Af Amer: 36 mL/min — ABNORMAL LOW
GFR calc non Af Amer: 31 mL/min — ABNORMAL LOW
Glucose, Bld: 162 mg/dL — ABNORMAL HIGH (ref 70–99)
Potassium: 2.2 mmol/L — CL (ref 3.5–5.1)
Sodium: 140 mmol/L (ref 135–145)

## 2019-08-04 LAB — CK: Total CK: 12008 U/L — ABNORMAL HIGH (ref 38–234)

## 2019-08-04 LAB — HEPATIC FUNCTION PANEL
ALT: 338 U/L — ABNORMAL HIGH (ref 0–44)
AST: 337 U/L — ABNORMAL HIGH (ref 15–41)
Albumin: 3.3 g/dL — ABNORMAL LOW (ref 3.5–5.0)
Alkaline Phosphatase: 142 U/L — ABNORMAL HIGH (ref 38–126)
Bilirubin, Direct: 0.2 mg/dL (ref 0.0–0.2)
Indirect Bilirubin: 0.5 mg/dL (ref 0.3–0.9)
Total Bilirubin: 0.7 mg/dL (ref 0.3–1.2)
Total Protein: 7.1 g/dL (ref 6.5–8.1)

## 2019-08-04 LAB — URINALYSIS, ROUTINE W REFLEX MICROSCOPIC
Bilirubin Urine: NEGATIVE
Glucose, UA: NEGATIVE mg/dL
Ketones, ur: NEGATIVE mg/dL
Leukocytes,Ua: NEGATIVE
Nitrite: POSITIVE — AB
Protein, ur: 100 mg/dL — AB
Specific Gravity, Urine: 1.011 (ref 1.005–1.030)
pH: 6 (ref 5.0–8.0)

## 2019-08-04 LAB — CBC
HCT: 48.4 % — ABNORMAL HIGH (ref 36.0–46.0)
Hemoglobin: 16.7 g/dL — ABNORMAL HIGH (ref 12.0–15.0)
MCH: 30.9 pg (ref 26.0–34.0)
MCHC: 34.5 g/dL (ref 30.0–36.0)
MCV: 89.5 fL (ref 80.0–100.0)
Platelets: 390 10*3/uL (ref 150–400)
RBC: 5.41 MIL/uL — ABNORMAL HIGH (ref 3.87–5.11)
RDW: 15.2 % (ref 11.5–15.5)
WBC: 15.3 10*3/uL — ABNORMAL HIGH (ref 4.0–10.5)
nRBC: 0.1 % (ref 0.0–0.2)

## 2019-08-04 LAB — SARS CORONAVIRUS 2 (TAT 6-24 HRS): SARS Coronavirus 2: NEGATIVE

## 2019-08-04 LAB — BASIC METABOLIC PANEL
Anion gap: 11 (ref 5–15)
BUN: 23 mg/dL (ref 8–23)
CO2: 15 mmol/L — ABNORMAL LOW (ref 22–32)
Calcium: 8.4 mg/dL — ABNORMAL LOW (ref 8.9–10.3)
Chloride: 112 mmol/L — ABNORMAL HIGH (ref 98–111)
Creatinine, Ser: 1.47 mg/dL — ABNORMAL HIGH (ref 0.44–1.00)
GFR calc Af Amer: 43 mL/min — ABNORMAL LOW (ref >60)
GFR calc non Af Amer: 37 mL/min — ABNORMAL LOW (ref >60)
Glucose, Bld: 85 mg/dL (ref 70–99)
Potassium: 2.9 mmol/L — ABNORMAL LOW (ref 3.5–5.1)
Sodium: 138 mmol/L (ref 135–145)

## 2019-08-04 LAB — TSH: TSH: 1.062 u[IU]/mL (ref 0.350–4.500)

## 2019-08-04 LAB — NA AND K (SODIUM & POTASSIUM), RAND UR
Potassium Urine: 13 mmol/L
Sodium, Ur: 41 mmol/L

## 2019-08-04 LAB — IRON AND TIBC
Iron: 111 ug/dL (ref 28–170)
Saturation Ratios: 30 % (ref 10.4–31.8)
TIBC: 372 ug/dL (ref 250–450)
UIBC: 261 ug/dL

## 2019-08-04 LAB — TROPONIN I (HIGH SENSITIVITY)
Troponin I (High Sensitivity): 30 ng/L — ABNORMAL HIGH (ref <18)
Troponin I (High Sensitivity): 31 ng/L — ABNORMAL HIGH (ref <18)

## 2019-08-04 LAB — CREATININE, URINE, RANDOM: Creatinine, Urine: 73.05 mg/dL

## 2019-08-04 LAB — C-REACTIVE PROTEIN: CRP: <0.5 mg/dL (ref <1.0)

## 2019-08-04 LAB — MAGNESIUM: Magnesium: 2.6 mg/dL — ABNORMAL HIGH (ref 1.7–2.4)

## 2019-08-04 LAB — POC SARS CORONAVIRUS 2 AG -  ED: SARS Coronavirus 2 Ag: NEGATIVE

## 2019-08-04 LAB — HIV ANTIBODY (ROUTINE TESTING W REFLEX): HIV Screen 4th Generation wRfx: NONREACTIVE

## 2019-08-04 LAB — LIPASE, BLOOD: Lipase: 95 U/L — ABNORMAL HIGH (ref 11–51)

## 2019-08-04 LAB — CBG MONITORING, ED: Glucose-Capillary: 123 mg/dL — ABNORMAL HIGH (ref 70–99)

## 2019-08-04 MED ORDER — LACTATED RINGERS IV SOLN: INTRAVENOUS | Status: AC

## 2019-08-04 MED ORDER — MAGNESIUM SULFATE 2 GM/50ML IV SOLN
2.0000 g | Freq: Once | INTRAVENOUS | Status: AC
  Administered 2019-08-04: 2 g via INTRAVENOUS
  Filled 2019-08-04: qty 50

## 2019-08-04 MED ORDER — POTASSIUM CHLORIDE 10 MEQ/100ML IV SOLN
10.0000 meq | INTRAVENOUS | Status: AC
  Administered 2019-08-04 – 2019-08-05 (×5): 10 meq via INTRAVENOUS
  Filled 2019-08-04 (×5): qty 100

## 2019-08-04 MED ORDER — NEPRO/CARBSTEADY PO LIQD
237.0000 mL | Freq: Two times a day (BID) | ORAL | Status: DC
  Administered 2019-08-05 – 2019-08-06 (×3): 237 mL via ORAL

## 2019-08-04 MED ORDER — HEPARIN SODIUM (PORCINE) 5000 UNIT/ML IJ SOLN
5000.0000 [IU] | Freq: Three times a day (TID) | INTRAMUSCULAR | Status: DC
  Administered 2019-08-04 – 2019-08-09 (×15): 5000 [IU] via SUBCUTANEOUS
  Filled 2019-08-04 (×14): qty 1

## 2019-08-04 MED ORDER — ACETAMINOPHEN 650 MG RE SUPP: 650.0000 mg | Freq: Four times a day (QID) | RECTAL | Status: DC | PRN

## 2019-08-04 MED ORDER — METOPROLOL SUCCINATE ER 25 MG PO TB24
25.0000 mg | ORAL_TABLET | Freq: Every day | ORAL | Status: DC
  Administered 2019-08-05 – 2019-08-08 (×4): 25 mg via ORAL
  Filled 2019-08-04 (×5): qty 1

## 2019-08-04 MED ORDER — POTASSIUM CHLORIDE 10 MEQ/100ML IV SOLN
10.0000 meq | Freq: Once | INTRAVENOUS | Status: AC
  Administered 2019-08-04: 10 meq via INTRAVENOUS
  Filled 2019-08-04: qty 100

## 2019-08-04 MED ORDER — VITAMIN D 25 MCG (1000 UNIT) PO TABS
1000.0000 [IU] | ORAL_TABLET | Freq: Every day | ORAL | Status: DC
  Administered 2019-08-04 – 2019-08-12 (×9): 1000 [IU] via ORAL
  Filled 2019-08-04 (×9): qty 1

## 2019-08-04 MED ORDER — SODIUM CHLORIDE 0.9% FLUSH
3.0000 mL | Freq: Once | INTRAVENOUS | Status: AC
  Administered 2019-08-04: 3 mL via INTRAVENOUS

## 2019-08-04 MED ORDER — POTASSIUM CHLORIDE CRYS ER 20 MEQ PO TBCR
40.0000 meq | EXTENDED_RELEASE_TABLET | Freq: Once | ORAL | Status: AC
  Administered 2019-08-04: 40 meq via ORAL
  Filled 2019-08-04: qty 2

## 2019-08-04 MED ORDER — ASPIRIN EC 81 MG PO TBEC
81.0000 mg | DELAYED_RELEASE_TABLET | Freq: Every day | ORAL | Status: DC
  Administered 2019-08-04 – 2019-08-12 (×9): 81 mg via ORAL
  Filled 2019-08-04 (×9): qty 1

## 2019-08-04 MED ORDER — VITAMIN D3 50 MCG (2000 UT) PO TABS: 1.0000 | ORAL_TABLET | Freq: Every day | ORAL | Status: DC

## 2019-08-04 MED ORDER — ACETAMINOPHEN 325 MG PO TABS: 650.0000 mg | ORAL_TABLET | Freq: Four times a day (QID) | ORAL | Status: DC | PRN

## 2019-08-04 MED ORDER — SENNOSIDES-DOCUSATE SODIUM 8.6-50 MG PO TABS: 1.0000 | ORAL_TABLET | Freq: Every evening | ORAL | Status: DC | PRN

## 2019-08-04 MED ORDER — ADULT MULTIVITAMIN W/MINERALS CH
1.0000 | ORAL_TABLET | Freq: Every day | ORAL | Status: DC
  Administered 2019-08-04 – 2019-08-12 (×9): 1 via ORAL
  Filled 2019-08-04 (×9): qty 1

## 2019-08-04 MED ORDER — LACTATED RINGERS IV BOLUS
1000.0000 mL | Freq: Once | INTRAVENOUS | Status: AC
  Administered 2019-08-04: 1000 mL via INTRAVENOUS

## 2019-08-04 MED ORDER — TICAGRELOR 60 MG PO TABS
60.0000 mg | ORAL_TABLET | Freq: Two times a day (BID) | ORAL | Status: DC
  Administered 2019-08-04 – 2019-08-06 (×6): 60 mg via ORAL
  Filled 2019-08-04 (×8): qty 1

## 2019-08-04 MED ORDER — POTASSIUM CHLORIDE 10 MEQ/100ML IV SOLN
10.0000 meq | INTRAVENOUS | Status: AC
  Administered 2019-08-04 (×2): 10 meq via INTRAVENOUS
  Filled 2019-08-04 (×2): qty 100

## 2019-08-04 MED ORDER — ROSUVASTATIN CALCIUM 20 MG PO TABS
40.0000 mg | ORAL_TABLET | Freq: Every day | ORAL | Status: DC
  Administered 2019-08-04 – 2019-08-06 (×3): 40 mg via ORAL
  Filled 2019-08-04 (×3): qty 2

## 2019-08-04 NOTE — H&P (Addendum)
Date: 08/04/2019               Patient Name:  Anne [name] MRN: [id number]  DOB: [birth year] Age / Sex: 64 y.o., female   PCP: Jodi Marble, MD         Medical Service: Internal Medicine Teaching Service         Attending Physician: Dr. Sid Falcon, MD    First Contact: Dr. Court Joy Pager: [phone]  Second Contact: Dr. Truman Hayward Pager: [phone]       After Hours (After 5p/  First Contact Pager: [phone]  weekends / holidays): Second Contact Pager: [phone]   Chief Complaint: Generalized weakness  History of Present Illness:  Anne [name] is a 64 year old female with CAD s/p PCI, Crohn's disease who presents with 1 to 2 week history of generalized weakness and decreased appetite. She states that she has been having a hard time walking around and has borrowed a family members walker to assist her with balance. She has not had any falls. Denies dizziness, lightheadedness, nausea, vomiting, abdominal pain, diarrhea, back pain, dysuria, urinary frequency/hesitancy/urgency.   The patient states that she felt this way once before approximately 3 years ago when she was told that she has a crohn's flare and required prednisone. The patient was first diagnosed with crohn's disease in 32 in [place] and had a part of her colon removed. She was placed on prednisone and on another medication for 7 years. She has been following with Dr. Benson Norway (Gastroenterology) in [place] who placed her on  [place]. The patient states that she has not seen GI in 2 years and has not been taking any medication for her crohns.  She has not had any abdominal pain but notices loose stool occasionally overnight after eating food.  She has been wearing a depends and the depends becomes full overnight sometimes otherwise she has 1 bowel movement per day normally.  In the ED, the patient was afebrile, normotensive, normal pulse and normal respirations, respirating in the 90s on room air.  She was given magnesium and po and IV  potassium.   Meds:  Current Meds  Medication Sig  . acetaminophen-codeine (TYLENOL #4) 300-60 MG tablet Take 1 tablet by mouth every 6 (six) hours as needed for moderate pain or severe pain. (Patient taking differently: Take 2 tablets by mouth 2 (two) times daily. )  . BRILINTA 60 MG TABS tablet Take 60 mg by mouth 2 (two) times daily.  . Cholecalciferol (VITAMIN D3) 50 MCG (2000 UT) TABS Take 1 tablet by mouth daily.  . metoprolol succinate (TOPROL-XL) 25 MG 24 hr tablet Take 25 mg by mouth daily.  . Multiple Vitamin (MULTIVITAMIN WITH MINERALS) TABS tablet Take 1 tablet by mouth daily. Centrum Silver  . nitroGLYCERIN (NITROSTAT) 0.4 MG SL tablet Place 1 tablet (0.4 mg total) under the tongue every 5 (five) minutes x 3 doses as needed for chest pain.  . rosuvastatin (CRESTOR) 40 MG tablet Take 40 mg by mouth daily.  . [DISCONTINUED] cyclobenzaprine (FLEXERIL) 10 MG tablet Take 10 mg by mouth 2 (two) times daily as needed for muscle spasms.     Allergies: Allergies as of 08/04/2019  . (No Known Allergies)   Past Medical History:  Diagnosis Date  . Crohn's colitis ([place])   . STEMI (ST elevation myocardial infarction) ([place])    10/27/17 PCI/DES x1 to the dRCA, with residual thrombus in the PLA, normal EF    Family History:  Hypertension-sister Breast  cancer-maternal aunt   Social History:  Retired ([id number]) [employer] Engineer, structural Lives in [place] with [place] 3 cigarettes/day for 7 years Does not drink alcohol Does not use any substances Has 1 son Anne [name] Does not use any assistive devices and able to do ADLs  Review of Systems: A complete ROS was negative except as per HPI.   Physical Exam: Blood pressure 115/79, pulse 89, temperature (!) 97.5 F (36.4 C), temperature source Oral, resp. rate 17, height 5' 9"  (1.753 m), weight 64 kg, last menstrual period 02/18/2001, SpO2 98 %. Physical Exam  Constitutional: She is oriented to person, place, and time. She appears  well-developed and well-nourished. No distress.  HENT:  Head: Normocephalic and atraumatic.  Eyes: Conjunctivae are normal.  Cardiovascular: Normal rate, regular rhythm and normal heart sounds.  Respiratory: Effort normal and breath sounds normal. No respiratory distress. She has no wheezes.  GI: Soft. Bowel sounds are normal. She exhibits no distension. There is no abdominal tenderness.  Musculoskeletal:        General: No edema.     Comments: 4/5 left upper extremity weakness both prox and distal,  5/5 rue, unable to list left lower extremity,  3/5 left prox weakness, left 5/5 distal muscle strength, right prox and distal strength 5/5  Neurological: She is alert and oriented to person, place, and time. She has normal reflexes. No cranial nerve deficit.  Skin: She is not diaphoretic.  No skin tenting  Psychiatric: She has a normal mood and affect. Her behavior is normal. Judgment and thought content normal.   Labs: CBC: WBC 15, hemoglobin 16.7, hematocrit 48, platelets 390 BMP: NA 140, K2.2, bicarb 19, glucose 162, creatinine 1.71 Troponin 30 UA pending Hepatic function panel pending Lipase pending TSH pending Magnesium pending   EKG: personally reviewed my interpretation is normal sinus rhythm, frequent PVC, prolonged QT  CXR: personally reviewed my interpretation is rotated chest x-ray, no infiltrate or effusions noted  Assessment & Plan by Problem: Active Problems:   Hypokalemia  [name] is a 64 year old female with CAD s/p PCI, Crohn's disease who presents with 1 to 2 week history of generalized fatigue and decreased appetite. Found to have potassium level of 2.2 and asymmetric weakness noted on physical exam.  AKI versus progressive renal disease Severe hypokalemia Nonanion gap metabolic alkalosis  The patient presented with a potassium of 2.2, creatinine of 1.7 increased from 1.2 in 2019, and GFR of 31.  No other renal function labs were obtained between this time.   Patient given both IV and oral potassium repletion.  EKG showing frequent PVCs and QT prolongation. Will evaluate for rhabdomyolysis secondary to severe hypokalemia. Will obtain ck level.  I suspect that the primary cause of the patient's hypokalemia is due to GI losses and poor oral intake. Not on medications that could contribute to hypokalemia.  -Repeat BMP every 4 hours to trend potassium  -cardiac monitoring  -Pending CK -Pending TSH -pending mg will replete accordingly  -renal ultrasound -urinalysis pending -urine studies pending to calculate fena -1L LR bolus followed by maintenance at 188m/hr for 10 hrs to be started after urine collected   Asymmetric weakness in left upper and lower extremity weakness  The patient has left proximal and distal upper extremity weakness and left proximal weakness. Will evaluate for acute stroke vs spinal stenosis vs hypokalemic periodic paralysis syndrome.   Patient does not have reported periodic episodes of weakness nor no known trigger therefore less likely primary. 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 No st depression, decrease in amplitude of t wave, nor u wave to suggest hypokalemic anderson syndrome although prolonged qt is present.   Patient has history of large disc protrusion and extrusion at l5-s1 which could be contributing to left proximal lower extremity weakness and fecal incontinence. However, it is important to note that the patient only notes fecal incontinence at nighttime.   -CT head wo contrast  -MRI brain wo contrast  -urine potassium -consider outpatient emg if weakness persists -PT and OT -tsh, t3, t4 to rule out thyrotoxic periodic paralysis  Leukocytosis  WBC 15, without fever. Hemodynamically stable. No infiltrate noted on chest xray, ua pending. No wounds seen. Maybe a stress reaction   -Trend wbc  CAD  Patient had cath  done in July 2019 with stent placement in distal RCA.  -atorvastatin 71m qhs  -aspirin 8385mqd  -Metoprolol 25 mg daily -Brilinta 60 mg twice daily -nitroglycerin 0.85m36mrn  Crohn's disease  The patient has not been seen by gastroenterologist in the past 2 years.  She does not have any overt symptoms of Crohn's flares at this time.  She will need to be followed by GI in the outpatient setting.    -Continue vitamin D - we will order iron and CRP  Dispo: Admit patient to Observation with expected length of stay less than 2 midnights.  Signed: ChuLars MageD

## 2019-08-04 NOTE — ED Triage Notes (Signed)
Pt reports all over body weakness 1-2 weeks and Left leg pain "for a while" pt reports her leg pain is in her "nerves"

## 2019-08-04 NOTE — ED Notes (Signed)
Ask patient for urine sample patient stated that she did not need to urinate at this time.

## 2019-08-04 NOTE — ED Provider Notes (Signed)
[facility] EMERGENCY DEPARTMENT Provider Note   CSN: [id number] Arrival date & time: 08/04/19  3419     History Chief Complaint  Patient presents with  . Weakness  . Leg Pain    Anne [name] is a 64 y.o. female.  64 year old female with past medical history including CAD s/p stenting, Crohn's disease who p/w weakness.  Patient reports 1 to 2 weeks of generalized weakness involving her whole body.  She reports associated nerve pain in her left leg that she has had for a long time.  No back pain.  She has had a decreased appetite, increased thirst, and loss of taste.  No loss of smell, fevers, cough, sore throat, vomiting, chest pain, or urinary symptoms.  No sick contacts.  The history is provided by the patient.  Weakness Leg Pain      Past Medical History:  Diagnosis Date  . Crohn's colitis ([place])   . STEMI (ST elevation myocardial infarction) ([place])    10/27/17 PCI/DES x1 to the dRCA, with residual thrombus in the PLA, normal EF    Patient Active Problem List   Diagnosis Date Noted  . Coronary syndrome, acute ([place]) 04/25/2018  . STEMI (ST elevation myocardial infarction) ([place]) 10/26/2017  . STEMI involving right coronary artery ([place]) 10/26/2017    Past Surgical History:  Procedure Laterality Date  . CORONARY/GRAFT ACUTE MI REVASCULARIZATION N/A 10/26/2017   Procedure: Coronary/Graft Acute MI Revascularization;  Surgeon: Troy Sine, MD;  Location: [facility];  Service: Cardiovascular;  Laterality: N/A;  . LEFT HEART CATH AND CORONARY ANGIOGRAPHY N/A 10/26/2017   Procedure: LEFT HEART CATH AND CORONARY ANGIOGRAPHY;  Surgeon: Troy Sine, MD;  Location: [facility];  Service: Cardiovascular;  Laterality: N/A;  . LEFT HEART CATH AND CORONARY ANGIOGRAPHY Left 05/05/2018   Procedure: Left heart cath and coronary angiography;  Surgeon: Dionisio David, MD;  Location: [facility];  Service: Cardiovascular;  Laterality: Left;  .  SMALL INTESTINE SURGERY       OB History   No obstetric history on file.     History reviewed. No pertinent family history.  Social History   Tobacco Use  . Smoking status: Current Some Day Smoker    Packs/day: 0.50  . Smokeless tobacco: Never Used  Substance Use Topics  . Alcohol use: No  . Drug use: No    Home Medications Prior to Admission medications   Medication Sig Start Date End Date Taking? Authorizing Provider  acetaminophen-codeine (TYLENOL #4) 300-60 MG tablet Take 1 tablet by mouth every 6 (six) hours as needed for moderate pain or severe pain. Patient taking differently: Take 2 tablets by mouth 2 (two) times daily.  11/17/17  Yes Fawze, Mina A, PA-C  BRILINTA 60 MG TABS tablet Take 60 mg by mouth 2 (two) times daily. 05/26/19  Yes [provider]  Cholecalciferol (VITAMIN D3) 50 MCG (2000 UT) TABS Take 1 tablet by mouth daily.   Yes [provider]  metoprolol succinate (TOPROL-XL) 25 MG 24 hr tablet Take 25 mg by mouth daily.   Yes [provider]  Multiple Vitamin (MULTIVITAMIN WITH MINERALS) TABS tablet Take 1 tablet by mouth daily. Centrum Silver   Yes [provider]  nitroGLYCERIN (NITROSTAT) 0.4 MG SL tablet Place 1 tablet (0.4 mg total) under the tongue every 5 (five) minutes x 3 doses as needed for chest pain. 10/28/17  Yes Reino Bellis B, NP  rosuvastatin (CRESTOR) 40 MG  tablet Take 40 mg by mouth daily. 07/18/19  Yes [provider]  alendronate (FOSAMAX) 70 MG tablet Take 70 mg by mouth every Sunday. Take with a full glass of water on an empty stomach.     [provider]  aspirin EC 81 MG EC tablet Take 1 tablet (81 mg total) by mouth daily. Patient not taking: Reported on 08/04/2019 10/29/17   Reino Bellis B, NP  atorvastatin (LIPITOR) 80 MG tablet TAKE 1 TABLET(80 MG) BY MOUTH DAILY AT 6 PM Patient not taking: No sig reported 07/08/18   Cheryln Manly, NP  AZASAN 75 MG TABS Take 187.5 mg by mouth  daily.  01/25/17   [provider]  predniSONE (STERAPRED UNI-PAK 21 TAB) 10 MG (21) TBPK tablet Take by mouth daily. Take 6 tabs by mouth daily  for 2 days, then 5 tabs for 2 days, then 4 tabs for 2 days, then 3 tabs for 2 days, 2 tabs for 2 days, then 1 tab by mouth daily for 2 days Patient not taking: Reported on 04/26/2018 11/17/17   Rodell Perna A, PA-C    Allergies    Patient has no known allergies.  Review of Systems   Review of Systems  Neurological: Positive for weakness.   All other systems reviewed and are negative except that which was mentioned in HPI  Physical Exam Updated Vital Signs BP 112/80   Pulse (!) 54   Temp (!) 97.5 F (36.4 C) (Oral)   Resp 16   Ht 5' 9"  (1.753 m)   Wt 64 kg   LMP 02/18/2001   SpO2 97%   BMI 20.82 kg/m   Physical Exam Vitals and nursing note reviewed.  Constitutional:      General: She is not in acute distress.    Appearance: She is well-developed.     Comments: uncomfortable  HENT:     Head: Normocephalic and atraumatic.  Eyes:     Extraocular Movements: Extraocular movements intact.     Conjunctiva/sclera: Conjunctivae normal.     Pupils: Pupils are equal, round, and reactive to light.  Cardiovascular:     Rate and Rhythm: Normal rate.     Heart sounds: Normal heart sounds. No murmur.     Comments: Frequent irregular beats Pulmonary:     Effort: Pulmonary effort is normal.     Breath sounds: Normal breath sounds.  Abdominal:     General: Bowel sounds are normal. There is no distension.     Palpations: Abdomen is soft.     Tenderness: There is no abdominal tenderness.  Musculoskeletal:     Cervical back: Neck supple.  Skin:    General: Skin is warm and dry.  Neurological:     Mental Status: She is alert and oriented to person, place, and time.     Cranial Nerves: No cranial nerve deficit.     Sensory: No sensory deficit.     Coordination: Coordination normal.     Comments: Fluent speech 5/5 BUE; 4/5 b/l  straight leg raise however 5/5 b/l dorsiflexion, plantarflexion No clonus Normal finger to nose testing  Psychiatric:        Judgment: Judgment normal.     ED Results / Procedures / Treatments   Labs (all labs ordered are listed, but only abnormal results are displayed) Labs Reviewed  BASIC METABOLIC PANEL - Abnormal; Notable for the following components:      Result Value   Potassium 2.2 (*)    CO2 19 (*)  Glucose, Bld 162 (*)    BUN 24 (*)    Creatinine, Ser 1.71 (*)    GFR calc non Af Amer 31 (*)    GFR calc Af Amer 36 (*)    All other components within normal limits  CBC - Abnormal; Notable for the following components:   WBC 15.3 (*)    RBC 5.41 (*)    Hemoglobin 16.7 (*)    HCT 48.4 (*)    All other components within normal limits  CBG MONITORING, ED - Abnormal; Notable for the following components:   Glucose-Capillary 123 (*)    All other components within normal limits  TROPONIN I (HIGH SENSITIVITY) - Abnormal; Notable for the following components:   Troponin I (High Sensitivity) 30 (*)    All other components within normal limits  SARS CORONAVIRUS 2 (TAT 6-24 HRS)  URINALYSIS, ROUTINE W REFLEX MICROSCOPIC  HEPATIC FUNCTION PANEL  LIPASE, BLOOD  TSH  MAGNESIUM  POC SARS CORONAVIRUS 2 AG -  ED  TROPONIN I (HIGH SENSITIVITY)    EKG EKG Interpretation  Date/Time:  Friday August 04 2019 08:44:13 EDT Ventricular Rate:  108 PR Interval:    QRS Duration: 78 QT Interval:  528 QTC Calculation: 707 R Axis:   6 Text Interpretation: * Critical Test Result: Long QTc Accelerated Junctional rhythm with frequent and consecutive Premature ventricular complexes Nonspecific ST and T wave abnormality Prolonged QT Abnormal ECG frequent PVCs Confirmed by Theotis Burrow [id number]) on 08/04/2019 9:35:38 AM   Radiology DG Chest Portable 1 View  Result Date: 08/04/2019 CLINICAL DATA:  Weakness, malaise EXAM: PORTABLE CHEST 1 VIEW COMPARISON:  07/04/2018 FINDINGS: The heart size  and mediastinal contours are within normal limits. Both lungs are clear. The visualized skeletal structures are unremarkable. IMPRESSION: No acute abnormality of the lungs in AP portable projection. Electronically Signed   By: Eddie Candle M.D.   On: 08/04/2019 10:45    Procedures .Critical Care Performed by: Sharlett Iles, MD Authorized by: Sharlett Iles, MD   Critical care provider statement:    Critical care time (minutes):  30   Critical care time was exclusive of:  Separately billable procedures and treating other patients   Critical care was necessary to treat or prevent imminent or life-threatening deterioration of the following conditions:  Dehydration (hypokalemia)   Critical care was time spent personally by me on the following activities:  Development of treatment plan with patient or surrogate, evaluation of patient's response to treatment, examination of patient, obtaining history from patient or surrogate, ordering and performing treatments and interventions, ordering and review of laboratory studies, ordering and review of radiographic studies and re-evaluation of patient's condition   (including critical care time)  Medications Ordered in ED Medications  sodium chloride flush (NS) 0.9 % injection 3 mL (has no administration in time range)  potassium chloride 10 mEq in 100 mL IVPB (has no administration in time range)  lactated ringers bolus 1,000 mL (has no administration in time range)  magnesium sulfate IVPB 2 g 50 mL (has no administration in time range)  potassium chloride SA (KLOR-CON) CR tablet 40 mEq (40 mEq Oral Given 08/04/19 1052)    ED Course  I have reviewed the triage vital signs and the nursing notes.  Pertinent labs & imaging results that were available during my care of the patient were reviewed by me and considered in my medical decision making (see chart for details).    MDM Rules/Calculators/A&P  PT without focal  neuro deficits, frequent PVCs, long QTc of 707 on EKG. lab work notable for potassium 2.2, BUN 24, creatinine 1.71 which is not significantly different from previous values, CO2 19, glucose 162, WBC 15.3, hemoglobin 16.7.  Troponin 30 which is likely due to her CKD.  Gave IV and oral potassium repletion as well as magnesium and fluid bolus given her poor p.o. intake and hemoconcentration.  Because of EKG changes likely 2/2 profound hypokalemia, consulted Internal Med teaching service for admission. Final Clinical Impression(s) / ED Diagnoses Final diagnoses:  Hypokalemia  Generalized weakness  Prolonged Q-T interval on ECG    Rx / DC Orders ED Discharge Orders    None       Shandie Bertz, Wenda Overland, MD 08/04/19 1110

## 2019-08-04 NOTE — ED Notes (Signed)
Admitting doctor at bedside. Per admitting MD she wants fluids paused until we can get urine sample for accurate sodium and creatinine level.

## 2019-08-05 DIAGNOSIS — M879 Osteonecrosis, unspecified: Secondary | ICD-10-CM | POA: Diagnosis present

## 2019-08-05 DIAGNOSIS — N281 Cyst of kidney, acquired: Secondary | ICD-10-CM | POA: Diagnosis not present

## 2019-08-05 DIAGNOSIS — Z20822 Contact with and (suspected) exposure to covid-19: Secondary | ICD-10-CM | POA: Diagnosis present

## 2019-08-05 DIAGNOSIS — N179 Acute kidney failure, unspecified: Secondary | ICD-10-CM | POA: Diagnosis present

## 2019-08-05 DIAGNOSIS — N189 Chronic kidney disease, unspecified: Secondary | ICD-10-CM | POA: Diagnosis present

## 2019-08-05 DIAGNOSIS — M87851 Other osteonecrosis, right femur: Secondary | ICD-10-CM | POA: Diagnosis not present

## 2019-08-05 DIAGNOSIS — E883 Tumor lysis syndrome: Secondary | ICD-10-CM | POA: Diagnosis present

## 2019-08-05 DIAGNOSIS — R9431 Abnormal electrocardiogram [ECG] [EKG]: Secondary | ICD-10-CM | POA: Diagnosis not present

## 2019-08-05 DIAGNOSIS — K769 Liver disease, unspecified: Secondary | ICD-10-CM | POA: Diagnosis not present

## 2019-08-05 DIAGNOSIS — K754 Autoimmune hepatitis: Secondary | ICD-10-CM | POA: Diagnosis present

## 2019-08-05 DIAGNOSIS — R262 Difficulty in walking, not elsewhere classified: Secondary | ICD-10-CM | POA: Diagnosis present

## 2019-08-05 DIAGNOSIS — Z955 Presence of coronary angioplasty implant and graft: Secondary | ICD-10-CM | POA: Diagnosis not present

## 2019-08-05 DIAGNOSIS — K5090 Crohn's disease, unspecified, without complications: Secondary | ICD-10-CM | POA: Diagnosis not present

## 2019-08-05 DIAGNOSIS — R531 Weakness: Secondary | ICD-10-CM | POA: Diagnosis not present

## 2019-08-05 DIAGNOSIS — N2589 Other disorders resulting from impaired renal tubular function: Secondary | ICD-10-CM | POA: Diagnosis present

## 2019-08-05 DIAGNOSIS — I252 Old myocardial infarction: Secondary | ICD-10-CM | POA: Diagnosis not present

## 2019-08-05 DIAGNOSIS — M6281 Muscle weakness (generalized): Secondary | ICD-10-CM | POA: Diagnosis not present

## 2019-08-05 DIAGNOSIS — E872 Acidosis: Secondary | ICD-10-CM | POA: Diagnosis present

## 2019-08-05 DIAGNOSIS — Z981 Arthrodesis status: Secondary | ICD-10-CM | POA: Diagnosis not present

## 2019-08-05 DIAGNOSIS — I493 Ventricular premature depolarization: Secondary | ICD-10-CM | POA: Diagnosis present

## 2019-08-05 DIAGNOSIS — E875 Hyperkalemia: Secondary | ICD-10-CM | POA: Diagnosis present

## 2019-08-05 DIAGNOSIS — Z8249 Family history of ischemic heart disease and other diseases of the circulatory system: Secondary | ICD-10-CM | POA: Diagnosis not present

## 2019-08-05 DIAGNOSIS — F17210 Nicotine dependence, cigarettes, uncomplicated: Secondary | ICD-10-CM | POA: Diagnosis present

## 2019-08-05 DIAGNOSIS — E876 Hypokalemia: Secondary | ICD-10-CM | POA: Diagnosis present

## 2019-08-05 DIAGNOSIS — E440 Moderate protein-calorie malnutrition: Secondary | ICD-10-CM | POA: Diagnosis present

## 2019-08-05 DIAGNOSIS — D1803 Hemangioma of intra-abdominal structures: Secondary | ICD-10-CM | POA: Diagnosis present

## 2019-08-05 DIAGNOSIS — M5416 Radiculopathy, lumbar region: Secondary | ICD-10-CM | POA: Diagnosis not present

## 2019-08-05 DIAGNOSIS — M3313 Other dermatomyositis without myopathy: Secondary | ICD-10-CM | POA: Diagnosis present

## 2019-08-05 DIAGNOSIS — K5010 Crohn's disease of large intestine without complications: Secondary | ICD-10-CM | POA: Diagnosis present

## 2019-08-05 DIAGNOSIS — I2510 Atherosclerotic heart disease of native coronary artery without angina pectoris: Secondary | ICD-10-CM | POA: Diagnosis present

## 2019-08-05 DIAGNOSIS — M5126 Other intervertebral disc displacement, lumbar region: Secondary | ICD-10-CM | POA: Diagnosis not present

## 2019-08-05 DIAGNOSIS — M609 Myositis, unspecified: Secondary | ICD-10-CM | POA: Diagnosis not present

## 2019-08-05 DIAGNOSIS — Z79899 Other long term (current) drug therapy: Secondary | ICD-10-CM | POA: Diagnosis not present

## 2019-08-05 DIAGNOSIS — E878 Other disorders of electrolyte and fluid balance, not elsewhere classified: Secondary | ICD-10-CM | POA: Diagnosis present

## 2019-08-05 DIAGNOSIS — M48061 Spinal stenosis, lumbar region without neurogenic claudication: Secondary | ICD-10-CM | POA: Diagnosis not present

## 2019-08-05 LAB — CBC
HCT: 39.3 % (ref 36.0–46.0)
Hemoglobin: 13.8 g/dL (ref 12.0–15.0)
MCH: 31.6 pg (ref 26.0–34.0)
MCHC: 35.1 g/dL (ref 30.0–36.0)
MCV: 89.9 fL (ref 80.0–100.0)
Platelets: 309 10*3/uL (ref 150–400)
RBC: 4.37 MIL/uL (ref 3.87–5.11)
RDW: 15.3 % (ref 11.5–15.5)
WBC: 13 10*3/uL — ABNORMAL HIGH (ref 4.0–10.5)
nRBC: 0 % (ref 0.0–0.2)

## 2019-08-05 LAB — BASIC METABOLIC PANEL
Anion gap: 10 (ref 5–15)
BUN: 18 mg/dL (ref 8–23)
CO2: 17 mmol/L — ABNORMAL LOW (ref 22–32)
Calcium: 7.6 mg/dL — ABNORMAL LOW (ref 8.9–10.3)
Chloride: 113 mmol/L — ABNORMAL HIGH (ref 98–111)
Creatinine, Ser: 1.19 mg/dL — ABNORMAL HIGH (ref 0.44–1.00)
GFR calc Af Amer: 55 mL/min — ABNORMAL LOW (ref >60)
GFR calc non Af Amer: 48 mL/min — ABNORMAL LOW (ref >60)
Glucose, Bld: 80 mg/dL (ref 70–99)
Potassium: 3.3 mmol/L — ABNORMAL LOW (ref 3.5–5.1)
Sodium: 140 mmol/L (ref 135–145)

## 2019-08-05 LAB — CK: Total CK: 10450 U/L — ABNORMAL HIGH (ref 38–234)

## 2019-08-05 MED ORDER — ACETAMINOPHEN-CODEINE #4 300-60 MG PO TABS
1.0000 | ORAL_TABLET | Freq: Four times a day (QID) | ORAL | Status: DC | PRN
  Filled 2019-08-05: qty 1

## 2019-08-05 MED ORDER — ACETAMINOPHEN-CODEINE #3 300-30 MG PO TABS
1.0000 | ORAL_TABLET | Freq: Four times a day (QID) | ORAL | Status: DC | PRN
  Administered 2019-08-06: 1 via ORAL
  Filled 2019-08-05: qty 1

## 2019-08-05 MED ORDER — POTASSIUM CHLORIDE CRYS ER 20 MEQ PO TBCR
30.0000 meq | EXTENDED_RELEASE_TABLET | Freq: Two times a day (BID) | ORAL | Status: AC
  Administered 2019-08-05 (×2): 30 meq via ORAL
  Filled 2019-08-05 (×2): qty 1

## 2019-08-05 MED ORDER — POTASSIUM CHLORIDE 10 MEQ/100ML IV SOLN
INTRAVENOUS | Status: AC
  Administered 2019-08-05: 10 meq
  Filled 2019-08-05: qty 100

## 2019-08-05 MED ORDER — LACTATED RINGERS IV SOLN: INTRAVENOUS | Status: AC

## 2019-08-05 NOTE — Evaluation (Signed)
Physical Therapy Evaluation Patient Details Name: JEFFERY GAMMELL MRN: [id number] DOB: 02-May-1955 Today's Date: 08/05/2019   History of Present Illness  Ms. Laubscher is a 64 year old woman with PMH of CAD, Crohn's Disease, chronic bulging disc lumbar spine who presents for decreased appetite and generalized weakness.  Also found to have a very low K, prolonged Qtc.     Clinical Impression  Patient presented sitting in bed, awake, and willing to participate in therapy. PTA, pt was independent in all ADL's and mobility until just PTA where she used her sisters Rollator. At the time of evaluation, pt states recent inc difficulty in moving L LE, she is mod I. By using her UE to manage her L LE in/out of bed. She was able to stand from EOB with min guard for safety. She ambulated ~75' with min guard and a RW, demonstrating 1 instance of L knee buckling but was able to ind. recover her balance. She required 1 seated rest break midway d/t fatigue and SOB, SpO2 >98%, HR varying from 46-92bpm while seated, resting- RN notified. Educated pt on importance of progression of exercise and energy conservation techniques. Recommend d/c once medically stable with HH PT to address mobility, safety, and independence. Pt would continue to benefit from skilled physical therapy services at this time while admitted and after d/c to address the below listed limitations in order to improve overall safety and independence with functional mobility.     Follow Up Recommendations Home health PT;Supervision for mobility/OOB;Supervision - Intermittent    Equipment Recommendations  Rolling walker with 5" wheels;3in1 (PT)    Recommendations for Other Services       Precautions / Restrictions Precautions Precautions: Fall Restrictions Weight Bearing Restrictions: No      Mobility  Bed Mobility Overal bed mobility: Needs Assistance Bed Mobility: Supine to Sit     Supine to sit: Supervision(pt physically lifts L LE to  manage in/out of bed)     General bed mobility comments: No PT assist required. Pt uses UE to manage L LE   Transfers Overall transfer level: Needs assistance   Transfers: Sit to/from Stand Sit to Stand: Min guard         General transfer comment: min guard from low bed height for safety, no AD used, no physical assist required  Ambulation/Gait Ambulation/Gait assistance: Min guard Gait Distance (Feet): 75 Feet Assistive device: Rolling walker (2 wheeled) Gait Pattern/deviations: Step-through pattern;Decreased stride length Gait velocity: mildly dec   General Gait Details: 1 instance of L LE buckling, pt able to ind recover. Pt required 1 rest break midway d/t fatigue and SOB. SpO2 >98% on RA and HR was variable from 46-92 at rest-RN notified.   Stairs            Wheelchair Mobility    Modified Rankin (Stroke Patients Only)       Balance Overall balance assessment: Needs assistance Sitting-balance support: No upper extremity supported;Feet supported Sitting balance-Leahy Scale: Good     Standing balance support: No upper extremity supported;Bilateral upper extremity supported;During functional activity Standing balance-Leahy Scale: Fair Standing balance comment: reliant on UE support during prolonged/challenging mobility. Pt able to static stand w/o difficulties                             Pertinent Vitals/Pain Pain Assessment: Faces Faces Pain Scale: Hurts little more Pain Location: Bil LE L>R Pain Descriptors / Indicators: Aching;Numbness Pain Intervention(s): Monitored  during session;Limited activity within patient's tolerance    Home Living Family/patient expects to be discharged to:: Private residence Living Arrangements: Spouse/significant other Available Help at Discharge: Family Type of Home: House Home Access: Stairs to enter Entrance Stairs-Rails: Can reach both Entrance Stairs-Number of Steps: 3 Home Layout: One level Home  Equipment: None      Prior Function Level of Independence: Independent               Hand Dominance   Dominant Hand: Right    Extremity/Trunk Assessment   Upper Extremity Assessment Upper Extremity Assessment: Overall WFL for tasks assessed;Defer to OT evaluation    Lower Extremity Assessment Lower Extremity Assessment: RLE deficits/detail;LLE deficits/detail;Generalized weakness RLE Deficits / Details: Strength grossly 4/5, L LE weaker vs. R LE. Pt reports numbness in Bil great toes though light touch sensation testing was negative  LLE Deficits / Details: Strength grossly 4/5, L LE weaker vs. R LE. Pt reports numbness in Bil great toes though light touch sensation testing was negative     Cervical / Trunk Assessment Cervical / Trunk Assessment: Normal  Communication   Communication: No difficulties  Cognition Arousal/Alertness: Awake/alert Behavior During Therapy: WFL for tasks assessed/performed Overall Cognitive Status: Within Functional Limits for tasks assessed                                        General Comments General comments (skin integrity, edema, etc.): HR variable from 46-92 at rest- [employer] notified. Pt also alluded to having medication in room, educated pt that only the RN can give meds and to discuss additional meds with RN and MD    Exercises General Exercises - Lower Extremity Quad Sets: Strengthening;Left;5 reps;Supine   Assessment/Plan    PT Assessment Patient needs continued PT services  PT Problem List Decreased strength;Decreased range of motion;Decreased activity tolerance;Decreased balance;Decreased mobility;Decreased coordination;Decreased cognition;Decreased knowledge of use of DME;Decreased safety awareness;Decreased knowledge of precautions       PT Treatment Interventions DME instruction;Gait training;Stair training;Functional mobility training;Therapeutic activities;Therapeutic exercise;Balance  training;Neuromuscular re-education    PT Goals (Current goals can be found in the Care Plan section)  Acute Rehab PT Goals Patient Stated Goal: to get stronger PT Goal Formulation: With patient/family Time For Goal Achievement: 08/19/19 Potential to Achieve Goals: Good    Frequency Min 3X/week   Barriers to discharge        Co-evaluation               AM-PAC PT "6 Clicks" Mobility  Outcome Measure Help needed turning from your back to your side while in a flat bed without using bedrails?: None Help needed moving from lying on your back to sitting on the side of a flat bed without using bedrails?: None Help needed moving to and from a bed to a chair (including a wheelchair)?: A Little Help needed standing up from a chair using your arms (e.g., wheelchair or bedside chair)?: None Help needed to walk in hospital room?: A Little Help needed climbing 3-5 steps with a railing? : A Lot 6 Click Score: 20    End of Session Equipment Utilized During Treatment: Gait belt Activity Tolerance: Patient tolerated treatment well Patient left: in bed;with call bell/phone within reach;with family/visitor present Nurse Communication: Mobility status;Other (comment)(HR variability, possible pt medication in room) PT Visit Diagnosis: Unsteadiness on feet (R26.81);Other abnormalities of gait and mobility (R26.89);Muscle weakness (generalized) (  M62.81)    Time: 9872-1587 PT Time Calculation (min) (ACUTE ONLY): 20 min   Charges:   PT Evaluation $PT Eval Moderate Complexity: 1 Mod         Jaryah Aracena, SPT Acute Rehab  [phone]   Nilaya Bouie 08/05/2019, 4:25 PM

## 2019-08-05 NOTE — Progress Notes (Addendum)
Subjective:  Anne [name] is a 64 y.o. with PMH of CAD s/p PCI, Crohn's disease and spine disease with history of cervical fusion admitted for severe hypokalemia and asymmetric weakness on hospital day 0  Patient's generalized weakness has improved with potassium replacement. She reports severe diarrhea and bowel incontinence at night since being off of her medication for Crohn's disease.  This morning her diarrhea has improved. Her focal weakness is in her left upper extremity and she reports this has been there for approximately 2 years. She had a surgery on her spine for the same kind of weakness in [place], which took away her right sided weakness. She reports she was evaluated as an outpatient by a Anne [name] ,neurogsurgeon in [place], but wasn't a good candidate for surgery at the time.   Objective:  Vital signs in last 24 hours: Vitals:   08/04/19 1706 08/04/19 2050 08/05/19 0103 08/05/19 0457  BP: 121/88 102/68 91/69 91/68   Pulse: (!) 44 77 82 83  Resp: 17 20 18 18   Temp: 97.6 F (36.4 C) 98.6 F (37 C) 98.6 F (37 C) 98.4 F (36.9 C)  TempSrc: Oral Oral Oral Oral  SpO2: 100% 99% 99% 100%  Weight:      Height:        General: NAD, nl appearance Cardiovascular: Normal rate, regular rhythm.  No murmurs, rubs, or gallops Pulmonary : Effort normal, breath sounds normal. No wheezes, rales, or rhonchi Abdominal: soft, nontender,  bowel sounds present Musculoskeletal: no swelling , deformity, injury ,or tenderness in extremities, Skin: Warm, dry , no bruising, erythema, or rash Psychiatric/Behavioral:  normal mood, normal behavior Neuro: Focal weakness in left proximal leg,motor 3/5 , 5/5 on right leg. no sensory deficits to light touch.  Bilateral upper extremities 5/5 strength  CBC Latest Ref Rng & Units 08/05/2019 08/04/2019 05/05/2018  WBC 4.0 - 10.5 K/uL 13.0(H) 15.3(H) 3.4(L)  Hemoglobin 12.0 - 15.0 g/dL 13.8 16.7(H) 11.6(L)  Hematocrit 36.0 - 46.0 % 39.3 48.4(H)  34.4(L)  Platelets 150 - 400 K/uL 309 390 383   BMP Latest Ref Rng & Units 08/05/2019 08/04/2019 08/04/2019  Glucose 70 - 99 mg/dL 80 85 162(H)  BUN 8 - 23 mg/dL 18 23 24(H)  Creatinine 0.44 - 1.00 mg/dL 1.19(H) 1.47(H) 1.71(H)  Sodium 135 - 145 mmol/L 140 138 140  Potassium 3.5 - 5.1 mmol/L 3.3(L) 2.9(L) 2.2(LL)  Chloride 98 - 111 mmol/L 113(H) 112(H) 108  CO2 22 - 32 mmol/L 17(L) 15(L) 19(L)  Calcium 8.9 - 10.3 mg/dL 7.6(L) 8.4(L) 9.4     Assessment/Plan:  Active Problems:   Hypokalemia   AKI (acute kidney injury) ([place])   Generalized weakness   Prolonged Q-T interval on ECG  [name] is a 64 year old female with coronary artery disease status post PCI, Crohn's disease off treatment, and spine disease with history of cervical fusion who presented with hypokalemia from GI losses 2/2 Crohn's disease.  Hypokalemia, NAGMA from GI losses secondary to Crohn's disease Likely flair of crohn's which has resolved. She has not been on Azathioprine for 2 years, reports she stopped following with her GI doctor.  We will hold off on restarting her azathioprine and recommend follow-up with her gastroenterologist.  Hyperkalemia improving with replacement overnight.  Potassium 3.3 this morning.  AG 10. WBC trending down. CK elevated on admission, follow up lab pending after receiving IV fluids overnight. Cr is trending down.  TSH normal. Plan: -Trend CK -Trend BMP -Replete potassium - LR @ 150 ml/hr  AKI Baseline Cr around 1.2, down with IV fluids.  Renal ultrasound without sign of obstruction.  Radiologist noted 2.3 x 2.5 x 2.1 cm hyperechoic right hepatic mass which was need to be followed up with a nonemergent MRI of the abdomen. Plan: Resolved, No further work-up at this time  CAD Patient had cath done in July 2019 with stent placement in distal RCA Plan: -Crestor 40 mg nightly -Aspirin 81 mg daily -Metoprolol 25 mg daily -Brilinta 60 mg  Left leg weakness Patient's left leg weakness  has been present for approximately 2 years and she was evaluated by neurosurgeon . Reports she was not offered surgery due to being on ticagrelor. This was also around her MI, and I expect this also was taken into consideration. Will recommend follow up with neurosurgeon at discharge. Plan: - PT/OT  DVT prophx: Heparin Diet: diet Bowel: n/a Code: Full  Dispo: Anticipated discharge in approximately 1-2 day(s).   Tamsen Snider, MD PGY1

## 2019-08-05 NOTE — Plan of Care (Signed)
  Problem: Clinical Measurements: Goal: Ability to maintain clinical measurements within normal limits will improve Outcome: Progressing Goal: Will remain free from infection Outcome: Progressing Goal: Diagnostic test results will improve Outcome: Progressing Goal: Respiratory complications will improve Outcome: Progressing Goal: Cardiovascular complication will be avoided Outcome: Progressing   Problem: Activity: Goal: Risk for activity intolerance will decrease Outcome: Progressing   Problem: Nutrition: Goal: Adequate nutrition will be maintained Outcome: Progressing   Problem: Safety: Goal: Ability to remain free from injury will improve Outcome: Progressing   Problem: Skin Integrity: Goal: Risk for impaired skin integrity will decrease Outcome: Progressing

## 2019-08-05 NOTE — Evaluation (Signed)
Occupational Therapy Evaluation Patient Details Name: Anne [name] MRN: [id number] DOB: 1956-01-13 Today's Date: 08/05/2019    History of Present Illness Ms. Saez is a 64 year old woman with PMH of CAD, Crohn's Disease, chronic bulging disc lumbar spine who presents for decreased appetite and generalized weakness.  Also found to have a very low K, prolonged Qtc.    Clinical Impression   Pt. States that he LE is new and has been experiencing difficulty moving LE over the past few days. Pt. Has to pick up LE to move them and states that the L LE feels like it is going to buckle. Pt. Was cooperative during session and required assist for stand pivot transfer. Pt. Is requiring assist with transfers and mobility. Acute OT to follow. Pt. May need HHOT vs ST SNF depending on progress. Pt. Husband is concerned secondary to her decreased ability to AMB.      Follow Up Recommendations  Home health OT;SNF(depending on progress in hospital)    Equipment Recommendations  (depends on progress in hospital and dc location)    Recommendations for Other Services       Precautions / Restrictions Precautions Precautions: Fall Restrictions Weight Bearing Restrictions: No      Mobility Bed Mobility Overal bed mobility: Needs Assistance Bed Mobility: Supine to Sit     Supine to sit: Supervision(patient has to lift legs to move them out of bed. )        Transfers Overall transfer level: Needs assistance   Transfers: Stand Pivot Transfers   Stand pivot transfers: Min assist            Balance                                           ADL either performed or assessed with clinical judgement   ADL Overall ADL's : Needs assistance/impaired Eating/Feeding: Independent   Grooming: Wash/dry hands;Wash/dry face;Sitting;Set up(patient states she feels like her L leg is going to give out)   Upper Body Bathing: Set up;Sitting   Lower Body Bathing: Minimal  assistance;Sit to/from stand   Upper Body Dressing : Set up;Sitting   Lower Body Dressing: Minimal assistance;Sit to/from stand(Pt. has to lift legs to cross to dress LE. )   Toilet Transfer: Minimal assistance;Stand-pivot   Toileting- Clothing Manipulation and Hygiene: Minimal assistance;Sit to/from stand       Functional mobility during ADLs: Minimal assistance General ADL Comments: Pt. has to lift legs secondary to new weaknes     Vision Baseline Vision/History: Wears glasses Wears Glasses: Reading only Patient Visual Report: No change from baseline       Perception     Praxis      Pertinent Vitals/Pain Pain Assessment: No/denies pain     Hand Dominance Right   Extremity/Trunk Assessment Upper Extremity Assessment Upper Extremity Assessment: Overall WFL for tasks assessed       Cervical / Trunk Assessment Cervical / Trunk Assessment: Normal   Communication Communication Communication: No difficulties   Cognition Arousal/Alertness: Awake/alert Behavior During Therapy: WFL for tasks assessed/performed Overall Cognitive Status: Within Functional Limits for tasks assessed                                     General Comments  Pt. states she feel like her  L leg is going to buckel     Exercises     Shoulder Instructions      Home Living Family/patient expects to be discharged to:: Private residence Living Arrangements: Spouse/significant other Available Help at Discharge: Family Type of Home: House Home Access: Stairs to enter Technical brewer of Steps: 3 Entrance Stairs-Rails: Can reach both Home Layout: One level     Bathroom Shower/Tub: Teacher, early years/pre: Handicapped height     Home Equipment: None          Prior Functioning/Environment Level of Independence: Independent                 OT Problem List: Decreased strength;Decreased activity tolerance;Decreased knowledge of use of DME or AE       OT Treatment/Interventions: Self-care/ADL training;DME and/or AE instruction;Therapeutic activities;Patient/family education    OT Goals(Current goals can be found in the care plan section) Acute Rehab OT Goals Patient Stated Goal: to get stronger OT Goal Formulation: With patient Time For Goal Achievement: 08/19/19 Potential to Achieve Goals: Good ADL Goals Pt Will Perform Grooming: with modified independence;standing Pt Will Perform Lower Body Bathing: with modified independence;sit to/from stand Pt Will Perform Lower Body Dressing: with modified independence;sit to/from stand Pt Will Transfer to Toilet: with modified independence;ambulating Pt Will Perform Toileting - Clothing Manipulation and hygiene: with modified independence;sit to/from stand  OT Frequency: Min 2X/week   Barriers to D/C: (LE weakness)          Co-evaluation              AM-PAC OT "6 Clicks" Daily Activity     Outcome Measure Help from another person eating meals?: None Help from another person taking care of personal grooming?: A Little Help from another person toileting, which includes using toliet, bedpan, or urinal?: A Little Help from another person bathing (including washing, rinsing, drying)?: A Little Help from another person to put on and taking off regular upper body clothing?: A Little Help from another person to put on and taking off regular lower body clothing?: A Little 6 Click Score: 19   End of Session Equipment Utilized During Treatment: Gait belt;Rolling walker Nurse Communication: (ok therapy)  Activity Tolerance: Patient tolerated treatment well Patient left: in chair;with call bell/phone within reach;with family/visitor present  OT Visit Diagnosis: Unsteadiness on feet (R26.81);Muscle weakness (generalized) (M62.81)                Time: 0920-1000 OT Time Calculation (min): 40 min Charges:  OT General Charges $OT Visit: 1 Visit OT Evaluation $OT Eval Moderate Complexity:  1 Mod OT Treatments $Self Care/Home Management : 8-22 mins  08/05/2019  Reece Packer OT/L    Ismerai Bin 08/05/2019, 11:21 AM

## 2019-08-06 ENCOUNTER — Inpatient Hospital Stay (HOSPITAL_COMMUNITY): Payer: Medicare Other

## 2019-08-06 DIAGNOSIS — R531 Weakness: Secondary | ICD-10-CM

## 2019-08-06 DIAGNOSIS — I2510 Atherosclerotic heart disease of native coronary artery without angina pectoris: Secondary | ICD-10-CM

## 2019-08-06 DIAGNOSIS — K5090 Crohn's disease, unspecified, without complications: Secondary | ICD-10-CM

## 2019-08-06 DIAGNOSIS — E876 Hypokalemia: Secondary | ICD-10-CM

## 2019-08-06 DIAGNOSIS — N179 Acute kidney failure, unspecified: Secondary | ICD-10-CM

## 2019-08-06 DIAGNOSIS — M5416 Radiculopathy, lumbar region: Secondary | ICD-10-CM

## 2019-08-06 LAB — SEDIMENTATION RATE: Sed Rate: 28 mm/hr — ABNORMAL HIGH (ref 0–22)

## 2019-08-06 LAB — BASIC METABOLIC PANEL
Anion gap: 6 (ref 5–15)
BUN: 15 mg/dL (ref 8–23)
CO2: 16 mmol/L — ABNORMAL LOW (ref 22–32)
Calcium: 7.2 mg/dL — ABNORMAL LOW (ref 8.9–10.3)
Chloride: 118 mmol/L — ABNORMAL HIGH (ref 98–111)
Creatinine, Ser: 1.05 mg/dL — ABNORMAL HIGH (ref 0.44–1.00)
GFR calc Af Amer: >60 mL/min (ref >60)
GFR calc non Af Amer: 55 mL/min — ABNORMAL LOW (ref >60)
Glucose, Bld: 100 mg/dL — ABNORMAL HIGH (ref 70–99)
Potassium: 3.9 mmol/L (ref 3.5–5.1)
Sodium: 140 mmol/L (ref 135–145)

## 2019-08-06 LAB — COMPREHENSIVE METABOLIC PANEL
ALT: 276 U/L — ABNORMAL HIGH (ref 0–44)
AST: 394 U/L — ABNORMAL HIGH (ref 15–41)
Albumin: 2.3 g/dL — ABNORMAL LOW (ref 3.5–5.0)
Alkaline Phosphatase: 118 U/L (ref 38–126)
Anion gap: 5 (ref 5–15)
BUN: 12 mg/dL (ref 8–23)
CO2: 22 mmol/L (ref 22–32)
Calcium: 7.2 mg/dL — ABNORMAL LOW (ref 8.9–10.3)
Chloride: 114 mmol/L — ABNORMAL HIGH (ref 98–111)
Creatinine, Ser: 1.08 mg/dL — ABNORMAL HIGH (ref 0.44–1.00)
GFR calc Af Amer: >60 mL/min (ref >60)
GFR calc non Af Amer: 53 mL/min — ABNORMAL LOW (ref >60)
Glucose, Bld: 125 mg/dL — ABNORMAL HIGH (ref 70–99)
Potassium: 2.8 mmol/L — ABNORMAL LOW (ref 3.5–5.1)
Sodium: 141 mmol/L (ref 135–145)
Total Bilirubin: 0.5 mg/dL (ref 0.3–1.2)
Total Protein: 5.2 g/dL — ABNORMAL LOW (ref 6.5–8.1)

## 2019-08-06 LAB — GAMMA GT: GGT: 71 U/L — ABNORMAL HIGH (ref 7–50)

## 2019-08-06 LAB — CK: Total CK: 13513 U/L — ABNORMAL HIGH (ref 38–234)

## 2019-08-06 LAB — C-REACTIVE PROTEIN: CRP: <0.5 mg/dL (ref <1.0)

## 2019-08-06 MED ORDER — LACTATED RINGERS IV SOLN: INTRAVENOUS | Status: AC

## 2019-08-06 MED ORDER — POTASSIUM CHLORIDE CRYS ER 20 MEQ PO TBCR
40.0000 meq | EXTENDED_RELEASE_TABLET | ORAL | Status: AC
  Administered 2019-08-06 (×2): 40 meq via ORAL
  Filled 2019-08-06 (×2): qty 2

## 2019-08-06 MED ORDER — GADOBUTROL 1 MMOL/ML IV SOLN
6.4000 mL | Freq: Once | INTRAVENOUS | Status: AC | PRN
  Administered 2019-08-07: 6.4 mL via INTRAVENOUS

## 2019-08-06 NOTE — Progress Notes (Addendum)
Subjective:  Anne [name] is a 64 y.o. with PMH of CAD s/p PCI, Crohn's disease and spine disease with history of cervical fusion admitted for severe hypokalemia and asymmetric weakness on hospital day 1  Anne [name] was examined and evaluated at bedside this am. She mentions that she continues to feel weak but has no other acute complaints. She continues to endorse left sided lower extremity proximal weakness. She also mentions pain when she flexes her hip.     Objective:  Vital signs in last 24 hours: Vitals:   08/05/19 0457 08/05/19 1348 08/05/19 2027 08/06/19 0430  BP: 91/68 102/69 (!) 90/52 90/63  Pulse: 83 87 70 63  Resp: 18 18    Temp: 98.4 F (36.9 C) 98 F (36.7 C) 98.3 F (36.8 C) 98 F (36.7 C)  TempSrc: Oral Oral Oral Oral  SpO2: 100% 99% 99% 100%  Weight:      Height:        General: NAD, nl appearance Cardiovascular: Normal rate, regular rhythm.  No murmurs, rubs, or gallops Pulmonary : Effort normal, breath sounds normal. No wheezes, rales, or rhonchi Abdominal: soft, nontender,  bowel sounds present Musculoskeletal: Tender in posterior proximal left leg, sore with palpation of groin and abduction Skin: Warm, dry , no bruising, erythema, or rash Psychiatric/Behavioral:  normal mood, normal behavior Neuro: Focal weakness in left proximal leg,motor 3/5 , 5/5 on right leg. no sensory deficits to light touch.  Bilateral upper extremities 5/5 strength  CBC Latest Ref Rng & Units 08/05/2019 08/04/2019 05/05/2018  WBC 4.0 - 10.5 K/uL 13.0(H) 15.3(H) 3.4(L)  Hemoglobin 12.0 - 15.0 g/dL 13.8 16.7(H) 11.6(L)  Hematocrit 36.0 - 46.0 % 39.3 48.4(H) 34.4(L)  Platelets 150 - 400 K/uL 309 390 383   BMP Latest Ref Rng & Units 08/06/2019 08/05/2019 08/04/2019  Glucose 70 - 99 mg/dL 100(H) 80 85  BUN 8 - 23 mg/dL 15 18 23   Creatinine 0.44 - 1.00 mg/dL 1.05(H) 1.19(H) 1.47(H)  Sodium 135 - 145 mmol/L 140 140 138  Potassium 3.5 - 5.1 mmol/L 3.9 3.3(L) 2.9(L)  Chloride 98 - 111  mmol/L 118(H) 113(H) 112(H)  CO2 22 - 32 mmol/L 16(L) 17(L) 15(L)  Calcium 8.9 - 10.3 mg/dL 7.2(L) 7.6(L) 8.4(L)     Assessment/Plan:  Active Problems:   Hypokalemia   AKI (acute kidney injury) ([place])   Generalized weakness   Prolonged Q-T interval on ECG  Anne [name] is a 64 year old female with coronary artery disease status post PCI, Crohn's disease off treatment, and spine disease with history of cervical fusion who presented with hypokalemia from GI losses 2/2 Crohn's disease.  Left leg weakness Patient's left leg weakness has been present for approximately 2 years and she was evaluated by neurosurgeon . Reports she was not offered surgery due to being on ticagrelor. This was also around her MI, and I expect this also was taken into consideration. CK elevated on admission and has remained elevated despite IV fluids. Consistent elevated CK and weakness is concerning for myosities. Likely not a systemic cause with asymetric distrubution. Needing support to ambulate with PT/OT.  Plan: -- MRI lumbar spine - MRI pelvic with open view to capture left quad (discussed with radiologist) - PT/OT - Sed rate, CRP -Trend CK - LR @ 150 ml/hr  Hypokalemia, NAGMA from GI losses secondary to Crohn's disease Likely flair of crohn's which has resolved. She has not been on Azathioprine for 2 years, reports she stopped following with her GI doctor.  We  will hold off on restarting her azathioprine and recommend follow-up with her gastroenterologist.  Hyperkalemia improved with replacement. Potassium 3.9 this morning. CO2 16,  AG 6.  Normal bowel movements overninight Plan: -Trend BMP  AKI Baseline Cr around 1.2, down with IV fluids.  Renal ultrasound without sign of obstruction.  Radiologist noted 2.3 x 2.5 x 2.1 cm hyperechoic right hepatic mass which was need to be followed up with a nonemergent MRI of the abdomen. Plan: Resolved, No further work-up at this time  CAD Patient had cath done in July  2019 with stent placement in distal RCA Plan: -Crestor 40 mg nightly -Aspirin 81 mg daily -Metoprolol 25 mg daily -Brilinta 60 mg  DVT prophx: Heparin Diet: regular diet Bowel: n/a Code: Full  Dispo: Anticipated discharge in approximately 1-2 day(s).   Tamsen Snider, MD PGY1

## 2019-08-06 NOTE — Progress Notes (Signed)
Bladder scan showed 148 ml of urine. Noted some urine in the bedside commode. Patient verbalized that she has been voiding well.

## 2019-08-07 ENCOUNTER — Inpatient Hospital Stay (HOSPITAL_COMMUNITY): Payer: Medicare Other

## 2019-08-07 DIAGNOSIS — R7401 Elevation of levels of liver transaminase levels: Secondary | ICD-10-CM

## 2019-08-07 DIAGNOSIS — R748 Abnormal levels of other serum enzymes: Secondary | ICD-10-CM

## 2019-08-07 DIAGNOSIS — R932 Abnormal findings on diagnostic imaging of liver and biliary tract: Secondary | ICD-10-CM

## 2019-08-07 DIAGNOSIS — R945 Abnormal results of liver function studies: Secondary | ICD-10-CM

## 2019-08-07 LAB — BASIC METABOLIC PANEL
Anion gap: 6 (ref 5–15)
BUN: 9 mg/dL (ref 8–23)
CO2: 20 mmol/L — ABNORMAL LOW (ref 22–32)
Calcium: 7.2 mg/dL — ABNORMAL LOW (ref 8.9–10.3)
Chloride: 117 mmol/L — ABNORMAL HIGH (ref 98–111)
Creatinine, Ser: 1.1 mg/dL — ABNORMAL HIGH (ref 0.44–1.00)
GFR calc Af Amer: >60 mL/min (ref >60)
GFR calc non Af Amer: 52 mL/min — ABNORMAL LOW (ref >60)
Glucose, Bld: 89 mg/dL (ref 70–99)
Potassium: 4.4 mmol/L (ref 3.5–5.1)
Sodium: 143 mmol/L (ref 135–145)

## 2019-08-07 LAB — CBC
HCT: 36.5 % (ref 36.0–46.0)
Hemoglobin: 12.6 g/dL (ref 12.0–15.0)
MCH: 31.2 pg (ref 26.0–34.0)
MCHC: 34.5 g/dL (ref 30.0–36.0)
MCV: 90.3 fL (ref 80.0–100.0)
Platelets: 303 10*3/uL (ref 150–400)
RBC: 4.04 MIL/uL (ref 3.87–5.11)
RDW: 15.8 % — ABNORMAL HIGH (ref 11.5–15.5)
WBC: 10.2 10*3/uL (ref 4.0–10.5)
nRBC: 0 % (ref 0.0–0.2)

## 2019-08-07 LAB — HEPATITIS B CORE ANTIBODY, TOTAL: Hep B Core Total Ab: NONREACTIVE

## 2019-08-07 LAB — HEPATITIS B CORE ANTIBODY, IGM: Hep B C IgM: NONREACTIVE

## 2019-08-07 LAB — HEPATITIS C ANTIBODY: HCV Ab: NONREACTIVE

## 2019-08-07 LAB — HEPATITIS B SURFACE ANTIGEN: Hepatitis B Surface Ag: NONREACTIVE

## 2019-08-07 LAB — CK: Total CK: 17118 U/L — ABNORMAL HIGH (ref 38–234)

## 2019-08-07 LAB — ANA W/REFLEX IF POSITIVE: Anti Nuclear Antibody (ANA): NEGATIVE

## 2019-08-07 MED ORDER — BOOST / RESOURCE BREEZE PO LIQD CUSTOM
1.0000 | Freq: Three times a day (TID) | ORAL | Status: DC
  Administered 2019-08-07 – 2019-08-12 (×11): 1 via ORAL

## 2019-08-07 MED ORDER — LACTATED RINGERS IV SOLN: INTRAVENOUS | Status: AC

## 2019-08-07 NOTE — Progress Notes (Signed)
Physical Therapy Treatment Patient Details Name: Anne [name] MRN: [id number] DOB: 1955-07-15 Today's Date: 08/07/2019    History of Present Illness Ms. Overdorf is a 64 year old woman with PMH of CAD, Crohn's Disease, chronic bulging disc lumbar spine who presents for decreased appetite and generalized weakness. MRI of the pelvis-diffuse symmetric edematous myositis of the pelvic girdle musculature general involving all the musculature except the gluteus maximus. Serpiginous subchondral sclerosis of the superior right humeral head, in keeping with AVN. Also found to have elevated CKs, very low K, prolonged Qtc.    PT Comments    Patient progressing slowly towards PT goals. Reports new onset of RLE weakness. Noted to have 2+/5 strength in hip flexors via MMT, which is new from yesterday. Pt performed 5xSTS in 46 seconds (11.4 secs is normal for her age) indicative of decreased LE strength, balance and fall risk. Tolerated gait training with heavy reliance on UEs and RW for support. No knee buckling today but pt fatigues quickly. Plan for a thigh muscle biopsy on Thursday, 4/29 for further assessment. Encouraged there ex and STS exercise to tolerance. Will follow.   Follow Up Recommendations  Home health PT;Supervision for mobility/OOB;Supervision - Intermittent     Equipment Recommendations  Rolling walker with 5" wheels;3in1 (PT)    Recommendations for Other Services       Precautions / Restrictions Precautions Precautions: Fall Precaution Comments: new right sided weakness Restrictions Weight Bearing Restrictions: No    Mobility  Bed Mobility               General bed mobility comments: Sitting in chair upon PT arrival.  Transfers Overall transfer level: Needs assistance Equipment used: Rolling walker (2 wheeled) Transfers: Sit to/from Stand Sit to Stand: Min guard         General transfer comment: Min guard for safety. Difficulty transitioning hands from arm rests  to walker handles initially. Stood from chair x5 for 5x STS test. Less reliance on UEs with increased reps.  Ambulation/Gait Ambulation/Gait assistance: Min guard Gait Distance (Feet): 80 Feet Assistive device: Rolling walker (2 wheeled) Gait Pattern/deviations: Step-through pattern;Trunk flexed;Decreased stride length;Steppage Gait velocity: decreased   General Gait Details: Slow, unsteady gait with steppage pattern when progressing RLE due to weakness; no instance of knee buckling on left today. Heavy reliance on UEs for support. Fatigues quickly. 2/4 DOE. VSS.   Stairs             Wheelchair Mobility    Modified Rankin (Stroke Patients Only)       Balance Overall balance assessment: Needs assistance Sitting-balance support: Feet supported;No upper extremity supported Sitting balance-Leahy Scale: Good     Standing balance support: During functional activity Standing balance-Leahy Scale: Fair Standing balance comment: Able to stand statically wtihout UE support, needs UE support for dynamic tasks.                            Cognition Arousal/Alertness: Awake/alert Behavior During Therapy: WFL for tasks assessed/performed Overall Cognitive Status: Within Functional Limits for tasks assessed                                        Exercises      General Comments General comments (skin integrity, edema, etc.): 5xSTS in 46 seconds (11.4 secs is normal for her age) indicative of decreased LE strength.  Pt reports no weakness of right hip flexors, grossly ~2+/5. Left hip flexors 2/5.      Pertinent Vitals/Pain Pain Assessment: No/denies pain    Home Living                      Prior Function            PT Goals (current goals can now be found in the care plan section) Progress towards PT goals: Progressing toward goals    Frequency    Min 3X/week      PT Plan Current plan remains appropriate    Co-evaluation               AM-PAC PT "6 Clicks" Mobility   Outcome Measure  Help needed turning from your back to your side while in a flat bed without using bedrails?: None Help needed moving from lying on your back to sitting on the side of a flat bed without using bedrails?: None Help needed moving to and from a bed to a chair (including a wheelchair)?: A Little Help needed standing up from a chair using your arms (e.g., wheelchair or bedside chair)?: A Little Help needed to walk in hospital room?: A Little Help needed climbing 3-5 steps with a railing? : A Lot 6 Click Score: 19    End of Session Equipment Utilized During Treatment: Gait belt Activity Tolerance: Patient tolerated treatment well Patient left: in chair;with call bell/phone within reach Nurse Communication: Mobility status PT Visit Diagnosis: Unsteadiness on feet (R26.81);Other abnormalities of gait and mobility (R26.89);Muscle weakness (generalized) (M62.81)     Time: 9735-3299 PT Time Calculation (min) (ACUTE ONLY): 18 min  Charges:  $Gait Training: 8-22 mins                     Marisa Severin, PT, DPT Acute Rehabilitation Services Pager [phone] Office [place] 08/07/2019, 12:53 PM

## 2019-08-07 NOTE — Progress Notes (Signed)
Subjective:  Anne [name] is a 64 y.o. with PMH of CAD s/p PCI, Crohn's disease and spine disease with history of cervical fusion admitted for severe hypokalemia and asymmetric weakness on hospital day 2  Anne [name] was examined and evaluated at bedside. She states she was observing MyChart data and was wondering why her liver enzymes were up. Discussed in detail regarding MRI findings yesterday and rising CK despite fluids. Discussed plan to consult surgery for muscle biopsy and plan to do further work-up.   Objective:  Vital signs in last 24 hours: Vitals:   08/06/19 0430 08/06/19 1445 08/06/19 2207 08/07/19 0533  BP: 90/63 (!) 106/55 (!) 93/51 (!) 107/56  Pulse: 63 70 68 71  Resp:  17  17  Temp: 98 F (36.7 C) 98.4 F (36.9 C) 98.5 F (36.9 C) 97.7 F (36.5 C)  TempSrc: Oral Oral Oral Oral  SpO2: 100% 100% 100% 100%  Weight:    66.6 kg  Height:        General: NAD, nl appearance Cardiovascular: Normal rate, regular rhythm.  No murmurs, rubs, or gallops Pulmonary : Effort normal, breath sounds normal. No wheezes, rales, or rhonchi Abdominal: soft, nontender,  bowel sounds present Musculoskeletal: Tender in posterior proximal left leg, sore with palpation of groin and abduction Skin: heliotrope rash, no other noticeable rashes.  Psychiatric/Behavioral:  normal mood, normal behavior Neuro: Focal weakness in left proximal leg,motor 3/5 , 5/5 on right leg. no sensory deficits to light touch.    CBC Latest Ref Rng & Units 08/07/2019 08/05/2019 08/04/2019  WBC 4.0 - 10.5 K/uL 10.2 13.0(H) 15.3(H)  Hemoglobin 12.0 - 15.0 g/dL 12.6 13.8 16.7(H)  Hematocrit 36.0 - 46.0 % 36.5 39.3 48.4(H)  Platelets 150 - 400 K/uL 303 309 390   BMP Latest Ref Rng & Units 08/07/2019 08/06/2019 08/06/2019  Glucose 70 - 99 mg/dL 89 125(H) 100(H)  BUN 8 - 23 mg/dL _0 Creatinine 0.44 - 1.00 mg/dL 1.10(H) 1.08(H) 1.05(H)  Sodium 135 - 145 mmol/L 143 141 140  Potassium 3.5 - 5.1 mmol/L 4.4 2.8(L)  3.9  Chloride 98 - 111 mmol/L 117(H) 114(H) 118(H)  CO2 22 - 32 mmol/L 20(L) 22 16(L)  Calcium 8.9 - 10.3 mg/dL 7.2(L) 7.2(L) 7.2(L)   Lab Results  Component Value Date   CKTOTAL 17,118 (H) 08/07/2019   EXAM: MRI LUMBAR SPINE WITHOUT CONTRAST  TECHNIQUE: Multiplanar, multisequence MR imaging of the lumbar spine was performed. No intravenous contrast was administered.  COMPARISON:  11/17/2017   IMPRESSION: L3-4: Disc bulge with small central protrusion with slight caudal migration in the midline, unchanged since 2019 and without apparent compressive stenosis.  L4-5: Chronic calcified disc herniation with stenosis of the lateral recesses and proximal foramina that could possibly cause neural compression on either side. No apparent change in the degree of stenosis compared to the previous study. There appears to be developing fusion across the disc space.  L5-S1: Involution of a broad-based disc protrusion in the right posterolateral to foraminal region compared to the previous study. The patient does continue to have stenosis of the right lateral recess and intervertebral foramen that could cause right-sided neural compression. Left foraminal stenosis because of encroachment by osteophyte and bulging disc material could also affect the left L5 nerve.  EXAM: MRI PELVIS WITHOUT CONTRAST  TECHNIQUE: Multiplanar multisequence MR imaging of the pelvis was performed. No intravenous contrast was administered.  COMPARISON:  None.  FINDINGS: Urinary Tract:  No abnormality visualized.  Bowel:  Unremarkable visualized pelvic bowel loops.  Vascular/Lymphatic: No pathologically enlarged lymph nodes. No significant vascular abnormality seen.  Reproductive: No mass or other significant abnormality. Incidental benign right-sided Bartholin's cyst.  Other:  None.  Musculoskeletal: Serpiginous subchondral sclerosis of the superior right humeral head (series 16,  image 24). There is diffuse, symmetric, edematous myositis of the pelvic girdle musculature, generally involving all included musculature except the gluteus maximii.  IMPRESSION: 1. There is diffuse, symmetric, edematous myositis of the pelvic girdle musculature, generally involving all included musculature except the gluteus maximii.  2. Serpiginous subchondral sclerosis of the superior right humeral head, in keeping with avascular necrosis.   Assessment/Plan:  Principal Problem:   Hypokalemia Active Problems:   AKI (acute kidney injury) ([place])   Generalized weakness   Prolonged Q-T interval on ECG  Anne [name] is a 64 year old female with coronary artery disease status post PCI, Crohn's disease off treatment, and spine disease with history of cervical fusion who presented with hypokalemia from GI losses 2/2 Crohn's disease.  Myositis Patient's left leg weakness has been present for approximately 2 years and she was evaluated by neurosurgeon . Reports she was not offered surgery due to being on ticagrelor. The weakness has progressed in last six months and CK elevated on admission. Pharmacy looked into timing of starting Statin, which correlates with start of weakness. Presentation is not entirely convincing this myopathy is from statin therapy, however I agree with my pharmacy colleagues it would be prudent to hold statin therapy. CK has remained elevated despite IV fluids, kidney function is stable. ESR minimally elevated and CRP nl . Statin for now. MRI on 4/25 showed diffuse myositis of pelvic girdle muscles with symmetric involvement. We have ordered ANA w/ reflex and discussed consideration of biopsy with general surgery. Patient has a heliotrope rash , she reports having for a while. Dermatomyositis, polymyositis, necrotizing immune myositis, and inclusion body myositis all remain on the differential.  Plan: - Follow up ANA w/ reflex - consulted surgery, greatly appreciate there  assistance -Trend CK - LR @ 250 ml/hr  Right hepatic Mass Elevated ALT, AST , Alk Phos Patient continues to have elevated LFT's and Alk phos. Also elevated GGT. Plan for further workup of right hepatic mass seen on ultrasound today with MRI.  Plan: - MRI abdomen  - PT/INR  - Trend CMP - Hep B and Hep C Screening  Hypokalemia, NAGMA from GI losses secondary to Crohn's disease Likely flair of crohn's which has resolved. She has not been on Azathioprine for 2 years, reports she stopped following with her GI doctor.  We will hold off on restarting her azathioprine and recommend follow-up with her gastroenterologist.  Hyperkalemia improved with replacement. Potassium 4.4 this morning. CO2 20 ,  AG 6.  Normal bowel movements overninight Plan: -Trend on CMP  AKI Baseline Cr around 1.2, down with IV fluids.  Renal ultrasound without sign of obstruction.  Radiologist noted 2.3 x 2.5 x 2.1 cm hyperechoic right hepatic mass which was need to be followed up with a nonemergent MRI of the abdomen ( MRI Abdomen on 4/26 ). Plan: Resolved, No further work-up at this time  CAD Patient had cath done in July 2019 with stent placement in distal RCA Plan: - hold Crestor 40 mg nightly -Aspirin 81 mg daily -Metoprolol 25 mg daily - hold Brilinta 60 mg  DVT prophx: Heparin Diet: regular diet Bowel: n/a Code: Full  Dispo: Anticipated discharge in approximately 1-2 day(s).   Tamsen Snider, MD PGY1

## 2019-08-07 NOTE — Consult Note (Signed)
[facility] Surgery Consult Note  Anne [name] Apr 15, 1955  [id number].    Requesting MD: Madalyn Rob Chief Complaint: Generalized weakness Reason for Consult: Left thigh muscle biopsy  HPI:  Patient is a 64 year old female who presented with complaints of generalized weakness.  She has a history of CAD with PCI, and a history Crohn's disease.  She has been having trouble for at least couple years.  Has become progressively worse.  Has a history of right-sided weakness and underwent surgery in 2014.  She reports going back to neurosurgery and Dr. Ronnald Ramp a couple years ago.  There is an MRI from 11/17/2017 that shows a large soft disc protrusion and extrusion central right at L5-S1 with some compression of the right L5 nerve.  There is a large chronic calcified disc protrusion L4-L5 with osteophytes creating a moderately severe compression of the thecal sac.  There was a small ligamentous disc extrusion to the right of the midline at L3-L4 without neural impingement.  Dr. Ronnald Ramp deferred any surgery because she was on Brilinta for her PCI.  The family does not know what surgery he might have recommended.  Her symptoms have become progressively more severe with significant weakness on the left side.  But on exam she also has proximal muscle weakness on the right side also.  Because of her difficulty walking she has borrowed a walker to help with her balance.    She has not had any falls.  She denied dizziness, lightheadedness nausea vomiting abdominal pain diarrhea.  She has had a similar episode in the past about 3 years ago was told she had a Crohn's flare and required prednisone.  She has been treated with prednisone intermittently since 1984 for her Crohn's disease.  She has not had any prednisone for some years.     Work-up so far shows she is afebrile vital signs are stable.  She has mild renal insufficiency and hypokalemia.  Renal insufficiency is improving potassium has been replaced.  CKs  are elevated 10,450>13,513,>17,118. WBC 10.2, sed rate 28. MRI of the pelvis without contrast shows diffuse symmetric edematous myositis of the pelvic girdle musculature general involving all the musculature except the gluteus maximus.  There is also Serpiginous subchondral sclerosis of the superior right humeral head, in keeping with avascular necrosis.  We are asked to see and evaluate for left thigh muscle biopsy.  She was seen by her cardiologist in March Dr. Chancy Milroy in [place].  She reports that she was placed on low-dose Lopressor 12.5 mg daily for tachycardia, but was otherwise doing well.   ROS: Review of Systems  Constitutional: Positive for malaise/fatigue and weight loss (40 pounds since Christmas). Negative for chills and fever.  HENT: Negative.   Eyes: Negative.   Respiratory: Negative.   Cardiovascular: Negative.   Gastrointestinal: Negative.   Genitourinary: Negative.   Musculoskeletal:       Progressive weakness over the last 2 years  Skin: Negative.   Neurological: Positive for dizziness (episodes of dizziness standing, usually in AM, can but can occur later also).  Endo/Heme/Allergies: Bruises/bleeds easily.  Psychiatric/Behavioral: Negative.     History reviewed. No pertinent family history.  Past Medical History:  Diagnosis Date  . Coronary artery disease   . Crohn's colitis ([place])   . Hypokalemia 08/04/2019  . STEMI (ST elevation myocardial infarction) ([place])    10/27/17 PCI/DES x1 to the dRCA, with residual thrombus in the PLA, normal EF    Past Surgical History:  Procedure Laterality Date  .  CORONARY/GRAFT ACUTE MI REVASCULARIZATION N/A 10/26/2017   Procedure: Coronary/Graft Acute MI Revascularization;  Surgeon: Troy Sine, MD;  Location: [facility];  Service: Cardiovascular;  Laterality: N/A;  . LEFT HEART CATH AND CORONARY ANGIOGRAPHY N/A 10/26/2017   Procedure: LEFT HEART CATH AND CORONARY ANGIOGRAPHY;  Surgeon: Troy Sine, MD;  Location:  [facility];  Service: Cardiovascular;  Laterality: N/A;  . LEFT HEART CATH AND CORONARY ANGIOGRAPHY Left 05/05/2018   Procedure: Left heart cath and coronary angiography;  Surgeon: Dionisio David, MD;  Location: [facility];  Service: Cardiovascular;  Laterality: Left;  . SMALL INTESTINE SURGERY      Social History:  reports that she quit smoking 11 days ago. She smoked 0.50 packs per day. She has never used smokeless tobacco. She reports that she does not drink alcohol or use drugs.  Allergies: No Known Allergies  Medications Prior to Admission  Medication Sig Dispense Refill  . acetaminophen-codeine (TYLENOL #4) 300-60 MG tablet Take 1 tablet by mouth every 6 (six) hours as needed for moderate pain or severe pain. (Patient taking differently: Take 2 tablets by mouth 2 (two) times daily. ) 10 tablet 0  . BRILINTA 60 MG TABS tablet Take 60 mg by mouth 2 (two) times daily.    . Cholecalciferol (VITAMIN D3) 50 MCG (2000 UT) TABS Take 1 tablet by mouth daily.    . metoprolol succinate (TOPROL-XL) 25 MG 24 hr tablet Take 25 mg by mouth daily.    . Multiple Vitamin (MULTIVITAMIN WITH MINERALS) TABS tablet Take 1 tablet by mouth daily. Centrum Silver    . nitroGLYCERIN (NITROSTAT) 0.4 MG SL tablet Place 1 tablet (0.4 mg total) under the tongue every 5 (five) minutes x 3 doses as needed for chest pain. 25 tablet 2  . rosuvastatin (CRESTOR) 40 MG tablet Take 40 mg by mouth daily.    Marland Kitchen alendronate (FOSAMAX) 70 MG tablet Take 70 mg by mouth every Sunday. Take with a full glass of water on an empty stomach.     Marland Kitchen aspirin EC 81 MG EC tablet Take 1 tablet (81 mg total) by mouth daily. (Patient not taking: Reported on 08/04/2019)    . atorvastatin (LIPITOR) 80 MG tablet TAKE 1 TABLET(80 MG) BY MOUTH DAILY AT 6 PM (Patient not taking: No sig reported) 90 tablet 1  . AZASAN 75 MG TABS Take 187.5 mg by mouth daily.   12  . predniSONE (STERAPRED UNI-PAK 21 TAB) 10 MG (21) TBPK tablet Take by  mouth daily. Take 6 tabs by mouth daily  for 2 days, then 5 tabs for 2 days, then 4 tabs for 2 days, then 3 tabs for 2 days, 2 tabs for 2 days, then 1 tab by mouth daily for 2 days (Patient not taking: Reported on 04/26/2018) 42 tablet 0    Blood pressure (!) 107/56, pulse 71, temperature 97.7 F (36.5 C), temperature source Oral, resp. rate 17, height 5' 9"  (1.753 m), weight 66.6 kg, last menstrual period 02/18/2001, SpO2 100 %. Physical Exam:  General: pleasant, WD, WN female who is laying in bed in NAD HEENT: head is normocephalic, atraumatic.  Sclera are noninjected.  PERRL.  Ears and nose without any masses or lesions.  Mouth is pink and moist Heart: regular, rate, and rhythm.  Normal s1,s2. No obvious murmurs, gallops, or rubs noted.  Palpable radial and pedal pulses bilaterally Lungs: CTAB, no wheezes, rhonchi, or rales noted.  Respiratory effort nonlabored Abd: soft,  NT, ND, +BS, no masses, hernias, or organomegaly MS:she has good strength in her upper extremities, but cannot lift either lower let without using her hands to assist.  Her left side is weaker than her right side. She presented with complaints primarily on the left. Skin: warm and dry with no masses, lesions, or rashes Neuro: Cranial nerves 2-12 grossly intact, sensation is normal throughout Psych: A&Ox3 with an appropriate affect.   Results for orders placed or performed during the hospital encounter of 08/04/19 (from the past 48 hour(s))  CK     Status: Abnormal   Collection Time: 08/06/19  4:02 AM  Result Value Ref Range   Total CK 13,513 (H) 38 - 234 U/L    Comment: RESULTS CONFIRMED BY MANUAL DILUTION Performed at [facility], [address], [place], [place] [address]   Basic metabolic panel     Status: Abnormal   Collection Time: 08/06/19  4:02 AM  Result Value Ref Range   Sodium 140 135 - 145 mmol/L   Potassium 3.9 3.5 - 5.1 mmol/L    Comment: SLIGHT HEMOLYSIS   Chloride 118 (H) 98 - 111 mmol/L   CO2  16 (L) 22 - 32 mmol/L   Glucose, Bld 100 (H) 70 - 99 mg/dL    Comment: Glucose reference range applies only to samples taken after fasting for at least 8 hours.   BUN 15 8 - 23 mg/dL   Creatinine, Ser 1.05 (H) 0.44 - 1.00 mg/dL   Calcium 7.2 (L) 8.9 - 10.3 mg/dL   GFR calc non Af Amer 55 (L) >60 mL/min   GFR calc Af Amer >60 >60 mL/min   Anion gap 6 5 - 15    Comment: Performed at [facility] [address] [place], [place] [address]  Sedimentation rate     Status: Abnormal   Collection Time: 08/06/19  9:58 AM  Result Value Ref Range   Sed Rate 28 (H) 0 - 22 mm/hr    Comment: Performed at [facility] [address], [place], [place] [address]  C-reactive protein     Status: None   Collection Time: 08/06/19  9:58 AM  Result Value Ref Range   CRP <0.5 <1.0 mg/dL    Comment: Performed at [facility], [place] [address] [place], [place] [address]  Comprehensive metabolic panel     Status: Abnormal   Collection Time: 08/06/19  3:21 PM  Result Value Ref Range   Sodium 141 135 - 145 mmol/L   Potassium 2.8 (L) 3.5 - 5.1 mmol/L   Chloride 114 (H) 98 - 111 mmol/L   CO2 22 22 - 32 mmol/L   Glucose, Bld 125 (H) 70 - 99 mg/dL    Comment: Glucose reference range applies only to samples taken after fasting for at least 8 hours.   BUN 12 8 - 23 mg/dL   Creatinine, Ser 1.08 (H) 0.44 - 1.00 mg/dL   Calcium 7.2 (L) 8.9 - 10.3 mg/dL   Total Protein 5.2 (L) 6.5 - 8.1 g/dL   Albumin 2.3 (L) 3.5 - 5.0 g/dL   AST 394 (H) 15 - 41 U/L   ALT 276 (H) 0 - 44 U/L   Alkaline Phosphatase 118 38 - 126 U/L   Total Bilirubin 0.5 0.3 - 1.2 mg/dL   GFR calc non Af Amer 53 (L) >60 mL/min   GFR calc Af Amer >60 >60 mL/min   Anion gap 5 5 - 15  Comment: Performed at [facility], [place] [address], [place], [place] [address]  Gamma GT     Status: Abnormal   Collection Time: 08/06/19  3:21 PM  Result Value Ref Range   GGT 71 (H) 7 - 50 U/L    Comment: Performed at [facility], [place] [address] [place], [place] [address]  CK     Status: Abnormal   Collection Time: 08/07/19  5:17 AM  Result Value Ref Range   Total CK 17,118 (H) 38 - 234 U/L    Comment: RESULTS CONFIRMED BY MANUAL DILUTION Performed at [facility], [place] [address] [place], [place] [address]   Basic metabolic panel     Status: Abnormal   Collection Time: 08/07/19  5:17 AM  Result Value Ref Range   Sodium 143 135 - 145 mmol/L   Potassium 4.4 3.5 - 5.1 mmol/L   Chloride 117 (H) 98 - 111 mmol/L   CO2 20 (L) 22 - 32 mmol/L   Glucose, Bld 89 70 - 99 mg/dL    Comment: Glucose reference range applies only to samples taken after fasting for at least 8 hours.   BUN 9 8 - 23 mg/dL   Creatinine, Ser 1.10 (H) 0.44 - 1.00 mg/dL   Calcium 7.2 (L) 8.9 - 10.3 mg/dL   GFR calc non Af Amer 52 (L) >60 mL/min   GFR calc Af Amer >60 >60 mL/min   Anion gap 6 5 - 15    Comment: Performed at [facility] [address] [place], [place] [address]  CBC     Status: Abnormal   Collection Time: 08/07/19  5:17 AM  Result Value Ref Range   WBC 10.2 4.0 - 10.5 K/uL   RBC 4.04 3.87 - 5.11 MIL/uL   Hemoglobin 12.6 12.0 - 15.0 g/dL   HCT 36.5 36.0 - 46.0 %   MCV 90.3 80.0 - 100.0 fL   MCH 31.2 26.0 - 34.0 pg   MCHC 34.5 30.0 - 36.0 g/dL   RDW 15.8 (H) 11.5 - 15.5 %   Platelets 303 150 - 400 K/uL   nRBC 0.0 0.0 - 0.2 %    Comment: Performed at [facility], [place] [address], [place], [place] [address]   MR LUMBAR SPINE WO CONTRAST  Result Date: 08/06/2019 CLINICAL DATA:  Proximal left leg weakness. EXAM: MRI LUMBAR SPINE WITHOUT CONTRAST TECHNIQUE: Multiplanar, multisequence MR imaging of the lumbar spine was performed. No intravenous contrast was administered. COMPARISON:  11/17/2017 FINDINGS: Segmentation: 5 lumbar type vertebral bodies as numbered previously. Alignment:  Normal Vertebrae: No fracture or primary bone lesion. Discogenic endplate marrow changes at L5-S1 could contribute to back pain.  Conus medullaris and cauda equina: Conus extends to the L1-2 level. Conus and cauda equina appear normal. Paraspinal and other soft tissues: Negative Disc levels: No abnormality at T12-L1 or L1-2. L2-3: Mild noncompressive disc bulge. L3-4: Moderate bulging of the disc with small central protrusion with slight caudal migration in midline. Slight indentation of the thecal sac. Mild facet hypertrophy. No apparent compressive stenosis. No change since the previous examination. L4-5: Chronic likely calcified disc herniation with associated endplate osteophytes. Canal narrowing with stenosis of the lateral recesses and proximal foramina, with similar stenosis compared to the previous study. There appears to be some developing fusion across the disc space. L5-S1: Right posterolateral broad-based disc protrusion has involuted slightly since the previous study. Pronounced bilateral facet and ligamentous hypertrophy right more  than left. Stenosis of the subarticular lateral recess and intervertebral foramen on the right, though less than was seen on the previous exam. There is left foraminal stenosis due to encroachment by osteophyte and bulging disc material as seen previously, which could also affect the exiting left L5 nerve. IMPRESSION: L3-4: Disc bulge with small central protrusion with slight caudal migration in the midline, unchanged since 2019 and without apparent compressive stenosis. L4-5: Chronic calcified disc herniation with stenosis of the lateral recesses and proximal foramina that could possibly cause neural compression on either side. No apparent change in the degree of stenosis compared to the previous study. There appears to be developing fusion across the disc space. L5-S1: Involution of a broad-based disc protrusion in the right posterolateral to foraminal region compared to the previous study. The patient does continue to have stenosis of the right lateral recess and intervertebral foramen that could  cause right-sided neural compression. Left foraminal stenosis because of encroachment by osteophyte and bulging disc material could also affect the left L5 nerve. Electronically Signed   By: Nelson Chimes M.D.   On: 08/06/2019 13:10   MR PELVIS WO CONTRAST  Result Date: 08/06/2019 CLINICAL DATA:  Myositis, weakness at hip and proximal legs, elevated CK EXAM: MRI PELVIS WITHOUT CONTRAST TECHNIQUE: Multiplanar multisequence MR imaging of the pelvis was performed. No intravenous contrast was administered. COMPARISON:  None. FINDINGS: Urinary Tract:  No abnormality visualized. Bowel:  Unremarkable visualized pelvic bowel loops. Vascular/Lymphatic: No pathologically enlarged lymph nodes. No significant vascular abnormality seen. Reproductive: No mass or other significant abnormality. Incidental benign right-sided Bartholin's cyst. Other:  None. Musculoskeletal: Serpiginous subchondral sclerosis of the superior right humeral head (series 16, image 24). There is diffuse, symmetric, edematous myositis of the pelvic girdle musculature, generally involving all included musculature except the gluteus maximii. IMPRESSION: 1. There is diffuse, symmetric, edematous myositis of the pelvic girdle musculature, generally involving all included musculature except the gluteus maximii. 2. Serpiginous subchondral sclerosis of the superior right humeral head, in keeping with avascular necrosis. Electronically Signed   By: Eddie Candle M.D.   On: 08/06/2019 15:21       Assessment/Plan Hx CAD/PCI -aspirin/Brilinta (EF50-55%, mild MR - Echo 10/2017) Hx Crohn's disease -1984 Hx lumbar radiculopathy Right hepatic hemangioma  Generalized weakness lower extremity weakness left greater than right;  with a history of right-sided lumbar surgery 2014 Marked CK elevationWe have been asked to a muscle biopsy on the left thigh.   Earnstine [facility] Surgery 08/07/2019, 9:13 AM Please see Amion for pager number  during day hours 7:00am-4:30pm  Lumbar disc disease with compression  FEN: Regular diet ID: None DVT: Patient is on aspirin/Brilinta -   Plan: We have been asked to do a muscle biopsy on the left thigh.  We will review with Dr. Ninfa Linden.  I have asked Dr. Court Joy to begin holding the [facility].    [facility] surgery 08/07/2019 Please see amnion

## 2019-08-07 NOTE — Care Management (Signed)
PT recommendations walker , 3 in 1 and home health. Patient currently not in room, will follow up later.   Magdalen Spatz RN

## 2019-08-07 NOTE — Progress Notes (Signed)
Initial Nutrition Assessment  DOCUMENTATION CODES:   Non-severe (moderate) malnutrition in context of chronic illness  INTERVENTION:   - Continue MVI with minerals daily  - Boost Breeze po TID, each supplement provides 250 kcal and 9 grams of protein  - Encourage adequate PO intake  NUTRITION DIAGNOSIS:   Moderate Malnutrition related to chronic illness (Crohn's disease) as evidenced by mild fat depletion, mild muscle depletion, moderate muscle depletion.  GOAL:   Patient will meet greater than or equal to 90% of their needs  MONITOR:   PO intake, Supplement acceptance, Labs, Weight trends, Skin, I & O's  REASON FOR ASSESSMENT:   Malnutrition Screening Tool    ASSESSMENT:   64 year old female who presented on 4/23 with generalized weakness and decreased appetite. PMH of CAD s/p PCI, Crohn's disease, diarrhea. Pt admitted with hypokalemia.   Noted pt with right hepatic mass.  MRI of the pelvis without contrast shows diffuse symmetric edematous myositis of the pelvic girdle musculature. Plan for muscle biopsy.  Pt currently on a Regular diet. Noted ~25% completed lunch meal tray in room. Spoke with pt at bedside who was told that she was not supposed to eat because she was having an MRI done. Confirmed this with RN.  Pt reports having a poor appetite that began in January of this year. Pt states that she eats a couple of bites of food and gets full. Pt states that she does not eat full meals. Pt reports that in January she also began having diarrhea at night. This is also when pt began losing weight.  Pt reports her UBW as 155 lbs. Current weight is 146.8 lbs. Reviewed weight history in chart. Pt with a 4.2 kg weight loss over the last 3 months. This is a 5.9% weight loss which is not quite significant for timeframe but is concerning given decreased PO intake.  Pt states that she has tried supplements in the past like Boost but that they irritate her stomach. Pt is willing  to try a clear liquid supplement. RD to order [facility].  Meal Completion: 25%, 100%  Medications reviewed and include: cholecalciferol, Boost Breeze, MVI with minerals IVF: LR @ 250 ml/hr  Labs reviewed: creatinine 1.10  UOP: 2150 ml x 24 hours I/O's: +3.0 L since admit  NUTRITION - FOCUSED PHYSICAL EXAM:    Most Recent Value  Orbital Region  Mild depletion  Upper Arm Region  Mild depletion  Thoracic and Lumbar Region  Unable to assess  Buccal Region  Mild depletion  Temple Region  Moderate depletion  Clavicle Bone Region  Moderate depletion  Clavicle and Acromion Bone Region  Moderate depletion  Scapular Bone Region  Unable to assess  Dorsal Hand  Mild depletion  Patellar Region  Mild depletion  Anterior Thigh Region  Mild depletion  Posterior Calf Region  Mild depletion  Edema (RD Assessment)  None  Hair  Reviewed  Eyes  Reviewed  Mouth  Reviewed  Skin  Reviewed  Nails  Reviewed       Diet Order:   Diet Order            Diet regular Room service appropriate? Yes; Fluid consistency: Thin  Diet effective now              EDUCATION NEEDS:   Education needs have been addressed  Skin:  Skin Assessment: Skin Integrity Issues: Other: open wound to right vagina  Last BM:  08/06/19  Height:   Ht Readings from Last 1  Encounters:  08/04/19 5' 9"  (1.753 m)    Weight:   Wt Readings from Last 1 Encounters:  08/07/19 66.6 kg    Ideal Body Weight:  65.9 kg  BMI:  Body mass index is 21.68 kg/m.  Estimated Nutritional Needs:   Kcal:  1650-1850  Protein:  80-95 grams  Fluid:  1.6-1.8 L    Anne Face, MS, RD, LDN Inpatient Clinical Dietitian Pager: [phone] Weekend/After Hours: [phone]

## 2019-08-08 DIAGNOSIS — E440 Moderate protein-calorie malnutrition: Secondary | ICD-10-CM

## 2019-08-08 DIAGNOSIS — E43 Unspecified severe protein-calorie malnutrition: Secondary | ICD-10-CM | POA: Insufficient documentation

## 2019-08-08 LAB — CBC
HCT: 35.1 % — ABNORMAL LOW (ref 36.0–46.0)
Hemoglobin: 12.1 g/dL (ref 12.0–15.0)
MCH: 30.8 pg (ref 26.0–34.0)
MCHC: 34.5 g/dL (ref 30.0–36.0)
MCV: 89.3 fL (ref 80.0–100.0)
Platelets: 298 10*3/uL (ref 150–400)
RBC: 3.93 MIL/uL (ref 3.87–5.11)
RDW: 15.8 % — ABNORMAL HIGH (ref 11.5–15.5)
WBC: 10.6 10*3/uL — ABNORMAL HIGH (ref 4.0–10.5)
nRBC: 0 % (ref 0.0–0.2)

## 2019-08-08 LAB — MAGNESIUM: Magnesium: 1.4 mg/dL — ABNORMAL LOW (ref 1.7–2.4)

## 2019-08-08 LAB — COMPREHENSIVE METABOLIC PANEL
ALT: 428 U/L — ABNORMAL HIGH (ref 0–44)
AST: 468 U/L — ABNORMAL HIGH (ref 15–41)
Albumin: 2.1 g/dL — ABNORMAL LOW (ref 3.5–5.0)
Alkaline Phosphatase: 112 U/L (ref 38–126)
Anion gap: 5 (ref 5–15)
BUN: 7 mg/dL — ABNORMAL LOW (ref 8–23)
CO2: 19 mmol/L — ABNORMAL LOW (ref 22–32)
Calcium: 7.3 mg/dL — ABNORMAL LOW (ref 8.9–10.3)
Chloride: 116 mmol/L — ABNORMAL HIGH (ref 98–111)
Creatinine, Ser: 0.91 mg/dL (ref 0.44–1.00)
GFR calc Af Amer: >60 mL/min (ref >60)
GFR calc non Af Amer: >60 mL/min (ref >60)
Glucose, Bld: 100 mg/dL — ABNORMAL HIGH (ref 70–99)
Potassium: 3.3 mmol/L — ABNORMAL LOW (ref 3.5–5.1)
Sodium: 140 mmol/L (ref 135–145)
Total Bilirubin: 0.5 mg/dL (ref 0.3–1.2)
Total Protein: 5.1 g/dL — ABNORMAL LOW (ref 6.5–8.1)

## 2019-08-08 LAB — NA AND K (SODIUM & POTASSIUM), RAND UR
Potassium Urine: 5 mmol/L
Sodium, Ur: 124 mmol/L

## 2019-08-08 LAB — AFP TUMOR MARKER: AFP, Serum, Tumor Marker: 4.5 ng/mL (ref 0.0–8.3)

## 2019-08-08 LAB — CK: Total CK: 16193 U/L — ABNORMAL HIGH (ref 38–234)

## 2019-08-08 MED ORDER — POTASSIUM CHLORIDE CRYS ER 20 MEQ PO TBCR
40.0000 meq | EXTENDED_RELEASE_TABLET | Freq: Two times a day (BID) | ORAL | Status: AC
  Administered 2019-08-08 (×2): 40 meq via ORAL
  Filled 2019-08-08 (×2): qty 2

## 2019-08-08 MED ORDER — MAGNESIUM SULFATE 2 GM/50ML IV SOLN
2.0000 g | Freq: Once | INTRAVENOUS | Status: AC
  Administered 2019-08-08: 2 g via INTRAVENOUS
  Filled 2019-08-08: qty 50

## 2019-08-08 NOTE — Progress Notes (Signed)
Occupational Therapy Treatment Patient Details Name: Anne [name] MRN: [id number] DOB: Mar 07, 1956 Today's Date: 08/08/2019    History of present illness Anne [name] is a 64 year old woman with PMH of CAD, Crohn's Disease, chronic bulging disc lumbar spine who presents for decreased appetite and generalized weakness. MRI of the pelvis-diffuse symmetric edematous myositis of the pelvic girdle musculature general involving all the musculature except the gluteus maximus. Serpiginous subchondral sclerosis of the superior right humeral head, in keeping with AVN. Also found to have elevated CKs, very low K, prolonged Qtc.   OT comments  Patient continues to make steady progress towards goals in skilled OT session. Patient's session encompassed functional ambulation and set up for ADLs at sink. Pt electing to sit on BSC to wash up and not wanting therapist to be present to assess status. Attempted to explain, but pt declined. Both nursing and tech made aware, and nursing was present in the room providing medications. Family also present and reiterated that pt would call for help to transfer back to bed or chair. Discharge of [place] continues to remain appropriate; will continue to follow acutely.   Follow Up Recommendations  Home health OT    Equipment Recommendations       Recommendations for Other Services      Precautions / Restrictions Precautions Precautions: Fall Precaution Comments: new right sided weakness Restrictions Weight Bearing Restrictions: No       Mobility Bed Mobility Overal bed mobility: Needs Assistance Bed Mobility: Supine to Sit     Supine to sit: Supervision        Transfers Overall transfer level: Needs assistance Equipment used: Rolling walker (2 wheeled) Transfers: Sit to/from Stand Sit to Stand: Min guard         General transfer comment: Min gaurd for safety    Balance Overall balance assessment: Needs assistance Sitting-balance support: Feet  supported;No upper extremity supported Sitting balance-Leahy Scale: Good     Standing balance support: During functional activity Standing balance-Leahy Scale: Fair Standing balance comment: Reliant on RW for ambulation                           ADL either performed or assessed with clinical judgement   ADL Overall ADL's : Needs assistance/impaired;Modified independent     Grooming: Sitting;Set up Grooming Details (indicate cue type and reason): Pt wanting to sit on BSC to wash up, pt not wanting therapist to be present, stating "I can do it myself" therapist informed nursing and tech and told family to call when pt was done to ambulate safely back to bed                             Functional mobility during ADLs: Supervision/safety;Rolling walker General ADL Comments: Pt wanting to sit on BSC to wash up, pt not wanting therapist to be present, stating "I can do it myself" therapist informed nursing and tech and told family to call when pt was done to ambulate safely back to bed     Vision       Perception     Praxis      Cognition Arousal/Alertness: Awake/alert Behavior During Therapy: WFL for tasks assessed/performed Overall Cognitive Status: Within Functional Limits for tasks assessed  Exercises     Shoulder Instructions       General Comments      Pertinent Vitals/ Pain       Pain Assessment: Faces Faces Pain Scale: Hurts a little bit Pain Location: LLE Pain Descriptors / Indicators: Aching;Numbness Pain Intervention(s): Limited activity within patient's tolerance;Monitored during session;Repositioned  Home Living                                          Prior Functioning/Environment              Frequency  Min 2X/week        Progress Toward Goals  OT Goals(current goals can now be found in the care plan section)  Progress towards OT goals:  Progressing toward goals  Acute Rehab OT Goals Patient Stated Goal: to get stronger OT Goal Formulation: With patient Time For Goal Achievement: 08/19/19 Potential to Achieve Goals: Good  Plan Discharge plan remains appropriate    Co-evaluation                 AM-PAC OT "6 Clicks" Daily Activity     Outcome Measure   Help from another person eating meals?: None Help from another person taking care of personal grooming?: None Help from another person toileting, which includes using toliet, bedpan, or urinal?: None Help from another person bathing (including washing, rinsing, drying)?: None Help from another person to put on and taking off regular upper body clothing?: None Help from another person to put on and taking off regular lower body clothing?: A Little 6 Click Score: 23    End of Session Equipment Utilized During Treatment: Rolling walker  OT Visit Diagnosis: Unsteadiness on feet (R26.81);Muscle weakness (generalized) (M62.81)   Activity Tolerance Patient tolerated treatment well   Patient Left with nursing/sitter in room;Other (comment);with family/visitor present(In bathroom at sink washing up, double checked with RN and tech who were confident in pt's abilities and family present and confirmed they would call once pt finished washing up)   Nurse Communication Mobility status;Other (comment)(In bathroom at sink washing up, double checked with RN and tech who were confident in pt's abilities and family present and confirmed they would call once pt finished washing up)        Time: 9753-0051 OT Time Calculation (min): 9 min  Charges: OT General Charges $OT Visit: 1 Visit OT Treatments $Self Care/Home Management : 8-22 mins  Anne [name], COTA/L Acute Rehabilitation Services [place] 08/08/2019, 2:34 PM

## 2019-08-08 NOTE — Hospital Course (Addendum)
Myositis Persistent Hypokalemia, Hyperchloremic  NAGMA Patient coming in with hypokalemia and a history of chronic diarrhea secondary to being off treatment for Crohn's disease.  However patient has normal bowel movements on hospital stay and CRP within normal limits.  Initially had prerenal AKI which quickly improved with fluids.  Consistent NAGMA concerning for RTA with ruling out renal disease and diarrhea.  Patient's other presenting symptom was weakness, most notable in left proximal leg and hip. MRI 04/25 showed diffuse myositis of pelvic girdle muscles with symmetric involvement.  CK trended as follows: ( CK 10.4>13.5K>17.1K>16.1K>13.8. Workup for myositis has included ESR (minimally elevated) and CRP (normal). AST and ALT consistent with muscle damage. Alk phos is at the high limit of normal and GGT elevated outside nl range as well. Gallbladder surgically absent and no common bile duct dilation. Avascular necrosis of right hip noted on MR Pelvis. Working differential includes dermatomyositis, polymyositis, necrotizing immune myositis, and inclusion body myositis. Patient has a heliotrope rash, consistent with dermatomyositis. No other rashes or skin changes.  Patient was on a statin and it has been held.  RTA with consistent hypokalemia leading to [employer] is leading differential after negative ANA. Will consider further workup with myositis panel if needed with results of muscle biopsy. Consulted general surgery on 4/26, asked to hold Brilinta, and they will take patient for muscle biopsy on 4/29   Avascular Necrosis of right hip Serpiginous subchondral sclerosis of the superior right humeral head noted on MR pelvis.  Patient is asymptomatic. Seen by ortho and they recommended follow up with Dr.Swinteck in the outpatient setting.   Crohn's disease Thought hypokalemia likely to flair of Crohn's disease.  Patient has been asymptomatic on admission and CRP normal. She has not been on Azathioprine for 2  years, reports she stopped following with her GI doctor.  We will hold off on restarting her azathioprine and recommend follow-up with her gastroenterologist.  AKI -resolved Baseline Cr around 1.2, down with IV fluids.  Renal ultrasound without sign of obstruction.  Radiologist noted 2.3 x 2.5 x 2.1 cm hyperechoic right hepatic mass which was need to be followed up with a nonemergent MRI of the abdomen ( MRI Abdomen on 4/26 ). Plan: Resolved, No further work-up at this time     Follow up: Orthopedic surgery- for avascular necrosis of right hip Dr.Jones, neurosurgeon for lumbar spine , chronic problem Her GI doctor for her Crohn's disease PCP for further titration of Sodium Bicarbonate if needed Goal 22-24 and I decreased Metoprolol form 25 to 12.5 on this admission, low BP.

## 2019-08-08 NOTE — Progress Notes (Signed)
Subjective:  Anne [name] is a 64 y.o. with PMH of CAD s/p PCI, Crohn's disease and spine disease with history of cervical fusion admitted for severe hypokalemia and asymmetric weakness on hospital day 3  Patient states that she is doing well. She is able to get up and go to bathroom by herself. Discussed current workup and plan for biopsy possibly on Thursday.   Objective:  Vital signs in last 24 hours: Vitals:   08/07/19 1450 08/07/19 2131 08/08/19 0511 08/08/19 0515  BP: 105/78 (!) 85/55 (!) 87/56 (!) 95/55  Pulse: 71 65 68   Resp: _0 Temp: 98.2 F (36.8 C) 98.3 F (36.8 C) 98.6 F (37 C)   TempSrc: Oral Oral Oral   SpO2: 100% 99% 100%   Weight:      Height:        General: NAD, nl appearance Cardiovascular: Normal rate, regular rhythm.  No murmurs, rubs, or gallops Pulmonary : Effort normal, breath sounds normal. No wheezes, rales, or rhonchi Abdominal: soft, nontender,  bowel sounds present Musculoskeletal: No tenderness on palpation. No deformities. Skin: heliotrope rash, no other noticeable rashes.  Psychiatric/Behavioral:  normal mood, normal behavior Neuro: Focal weakness in left proximal leg,motor 4/5 , 5/5 on right leg. no sensory deficits to light touch.    CBC Latest Ref Rng & Units 08/08/2019 08/07/2019 08/05/2019  WBC 4.0 - 10.5 K/uL 10.6(H) 10.2 13.0(H)  Hemoglobin 12.0 - 15.0 g/dL 12.1 12.6 13.8  Hematocrit 36.0 - 46.0 % 35.1(L) 36.5 39.3  Platelets 150 - 400 K/uL 298 303 309   BMP Latest Ref Rng & Units 08/08/2019 08/07/2019 08/06/2019  Glucose 70 - 99 mg/dL 100(H) 89 125(H)  BUN 8 - 23 mg/dL 7(L) 9 12  Creatinine 0.44 - 1.00 mg/dL 0.91 1.10(H) 1.08(H)  Sodium 135 - 145 mmol/L 140 143 141  Potassium 3.5 - 5.1 mmol/L 3.3(L) 4.4 2.8(L)  Chloride 98 - 111 mmol/L 116(H) 117(H) 114(H)  CO2 22 - 32 mmol/L 19(L) 20(L) 22  Calcium 8.9 - 10.3 mg/dL 7.3(L) 7.2(L) 7.2(L)   Lab Results  Component Value Date   CKTOTAL 16,193 (H) 08/08/2019      Assessment/Plan:  Principal Problem:   Hypokalemia Active Problems:   AKI (acute kidney injury) ([place])   Generalized weakness   Prolonged Q-T interval on ECG   Malnutrition of moderate degree  Anne [name] is a 64 year old female with coronary artery disease status post PCI, Crohn's disease off treatment, and spine disease with history of cervical fusion who presented with hypokalemia from GI losses 2/2 Crohn's disease.  Myositis MRI 04/25 shows diffuse myositis of pelvic girdle muscles with symmetric involvement.  CK continues to be elevated, despite increasing rate of fluids ( CK 13.5K>17.1K>16.1K) ESR minimally elevated and CRP normal. AST and ALT elevated. Patient has a heliotrope rash, consistent with dermatomyositis. No other rashes.  Patient was on a statin and it has been held.  Consulted general surgery on 4/26, plan for biopsy after holding Brilinta for 3 to 4 days.  Plan for biopsy on Thursday.  - Dermatomyositis, polymyositis, necrotizing immune myositis, and inclusion body myositis all remain on the differential. See hypokalemia, for further discussion of workup.  Plan: - surgery consulted, greatly appreciate there assistance -Trend CK - LR @ 250 ml/hr   Elevated ALT, AST Patient's LFT's are trending up, likely related to problem #1 above. Alk Phos also elevated to upper limit of normal and elevated GGT which I cant explain in setting  of only myositis.Marland Kitchen MRI abdomen showed right hepatic mass seen on ultrasound is benign hepatic hemangioma. Hep B negative, and Hep C negative. HIV negative.  Plan:  - Trend CMP - Check autoimmune hepatitis labs  Hypokalemia, Hyperchloremic , NAGMA   Thought likely to flair of crohn's initially. Patient has been asymptomatic on admission and CRP normal. She has not been on Azathioprine for 2 years, reports she stopped following with her GI doctor.  We will hold off on restarting her azathioprine and recommend follow-up with her  gastroenterologist. With ongoing hypokalemia and NAGMA concern is for possible RTA. Potassium 3.3 , Mg 1.4 this morning. CO2 19 ,  AG 5.  Urine studies consistent with RTA  AG of 25. Urinalysis show urine PH >5.5. Will continue workup for possible RTA, leading to chronic hypokalemia, and leading to Inclusion body myositis ( biopsy pending) .  Plan: -Trend on CMP - Urine Ca/Cr ratio - Muscle biopsy as above - replete Mg, K   CAD Patient had cath done in July 2019 with stent placement in distal RCA Plan: - hold Crestor 40 mg nightly -Aspirin 81 mg daily -Metoprolol 25 mg daily - hold Brilinta 60 mg  DVT prophx: Heparin Diet: regular diet Bowel: n/a Code: Full  Dispo: Anticipated discharge in approximately 1-2 day(s).   Tamsen Snider, MD PGY1

## 2019-08-08 NOTE — TOC Initial Note (Signed)
Transition of Care [facility]) - Initial/Assessment Note    Patient Details  Name: Anne [name] MRN: [id number] Date of Birth: 10/26/1955  [facility] CM/SW Contact:    Marilu Favre, RN Phone Number: 08/08/2019, 4:21 PM  Clinical Narrative:                 Spoke to patient at bedside. Patient from home with boyfriend. Confirmed face sheet information.   Patient has a 3 in 1 at home already.   PT and OT  recommending HHPT/OT and walker .NCM provided patient with [email or web address] home health list. She will discuss with her sister before deciding on agency. Discussed walker with patient. PT recommended a rolling walker, patient requesting a wlaker with a seat. Explained PT did not recommended walker with seat. NCM will ask PT if they feel patient is safe with walker with seat.   NCM will follow up tomorrow.   Expected Discharge Plan: [place] Barriers to Discharge: Continued Medical Work up(BX Thursday)   Patient Goals and CMS Choice Patient states their goals for this hospitalization and ongoing recovery are:: to return to home CMS Medicare.gov Compare Post Acute Care list provided to:: Patient Choice offered to / list presented to : Patient  Expected Discharge Plan and Services Expected Discharge Plan: [place]   Discharge Planning Services: CM Consult Post Acute Care Choice: Home Health, Durable Medical Equipment Living arrangements for the past 2 months: Single Family Home                           HH Arranged: PT, OT          Prior Living Arrangements/Services Living arrangements for the past 2 months: Single Family Home Lives with:: Significant Other Patient language and need for interpreter reviewed:: Yes Do you feel safe going back to the place where you live?: Yes      Need for Family Participation in Patient Care: Yes (Comment) Care giver support system in place?: Yes (comment) Current home services:  DME Criminal Activity/Legal Involvement Pertinent to Current Situation/Hospitalization: No - Comment as needed  Activities of Daily Living Home Assistive Devices/Equipment: Eyeglasses ADL Screening (condition at time of admission) Patient's cognitive ability adequate to safely complete daily activities?: Yes Is the patient deaf or have difficulty hearing?: No Does the patient have difficulty seeing, even when wearing glasses/contacts?: No Does the patient have difficulty concentrating, remembering, or making decisions?: No Patient able to express need for assistance with ADLs?: Yes Does the patient have difficulty dressing or bathing?: No Independently performs ADLs?: Yes (appropriate for developmental age) Does the patient have difficulty walking or climbing stairs?: Yes Weakness of Legs: Both Weakness of Arms/Hands: None  Permission Sought/Granted   Permission granted to share information with : No              Emotional Assessment Appearance:: Appears stated age Attitude/Demeanor/Rapport: Engaged Affect (typically observed): Accepting Orientation: : Oriented to Self, Oriented to Place, Oriented to  Time, Oriented to Situation Alcohol / Substance Use: Not Applicable Psych Involvement: No (comment)  Admission diagnosis:  Hypokalemia [E87.6] Prolonged Q-T interval on ECG [R94.31] Generalized weakness [R53.1] AKI (acute kidney injury) ([place]) [N17.9] Patient Active Problem List   Diagnosis Date Noted  . Malnutrition of moderate degree 08/08/2019  . Hypokalemia 08/04/2019  . AKI (acute kidney injury) ([place])   . Generalized weakness   . Prolonged Q-T interval on ECG   .  Coronary syndrome, acute ([place]) 04/25/2018  . STEMI (ST elevation myocardial infarction) ([place]) 10/26/2017  . STEMI involving right coronary artery ([place]) 10/26/2017   PCP:  Jodi Marble, MD Pharmacy:   [facility] [place], [address] AT [address] [place] [address] Phone: [phone] Fax: [phone]     Social Determinants of Health (SDOH) Interventions    Readmission Risk Interventions No flowsheet data found.

## 2019-08-09 DIAGNOSIS — R031 Nonspecific low blood-pressure reading: Secondary | ICD-10-CM

## 2019-08-09 DIAGNOSIS — M87051 Idiopathic aseptic necrosis of right femur: Secondary | ICD-10-CM

## 2019-08-09 LAB — CK: Total CK: 13841 U/L — ABNORMAL HIGH (ref 38–234)

## 2019-08-09 LAB — COMPREHENSIVE METABOLIC PANEL
ALT: 301 U/L — ABNORMAL HIGH (ref 0–44)
AST: 327 U/L — ABNORMAL HIGH (ref 15–41)
Albumin: 2.1 g/dL — ABNORMAL LOW (ref 3.5–5.0)
Alkaline Phosphatase: 103 U/L (ref 38–126)
Anion gap: 6 (ref 5–15)
BUN: 7 mg/dL — ABNORMAL LOW (ref 8–23)
CO2: 19 mmol/L — ABNORMAL LOW (ref 22–32)
Calcium: 7.7 mg/dL — ABNORMAL LOW (ref 8.9–10.3)
Chloride: 116 mmol/L — ABNORMAL HIGH (ref 98–111)
Creatinine, Ser: 0.85 mg/dL (ref 0.44–1.00)
GFR calc Af Amer: >60 mL/min (ref >60)
GFR calc non Af Amer: >60 mL/min (ref >60)
Glucose, Bld: 100 mg/dL — ABNORMAL HIGH (ref 70–99)
Potassium: 4.1 mmol/L (ref 3.5–5.1)
Sodium: 141 mmol/L (ref 135–145)
Total Bilirubin: 0.7 mg/dL (ref 0.3–1.2)
Total Protein: 4.7 g/dL — ABNORMAL LOW (ref 6.5–8.1)

## 2019-08-09 LAB — CBC
HCT: 35.9 % — ABNORMAL LOW (ref 36.0–46.0)
Hemoglobin: 12.4 g/dL (ref 12.0–15.0)
MCH: 31.5 pg (ref 26.0–34.0)
MCHC: 34.5 g/dL (ref 30.0–36.0)
MCV: 91.1 fL (ref 80.0–100.0)
Platelets: 294 10*3/uL (ref 150–400)
RBC: 3.94 MIL/uL (ref 3.87–5.11)
RDW: 16.6 % — ABNORMAL HIGH (ref 11.5–15.5)
WBC: 11.7 10*3/uL — ABNORMAL HIGH (ref 4.0–10.5)
nRBC: 0 % (ref 0.0–0.2)

## 2019-08-09 LAB — CHLORIDE, URINE, RANDOM: Chloride Urine: 105 mmol/L

## 2019-08-09 LAB — MAGNESIUM: Magnesium: 1.5 mg/dL — ABNORMAL LOW (ref 1.7–2.4)

## 2019-08-09 LAB — CALCIUM / CREATININE RATIO, URINE
Calcium, Ur: 6.4 mg/dL
Calcium/Creat.Ratio: 395 mg/g creat (ref 29–442)
Creatinine, Urine: 16.2 mg/dL

## 2019-08-09 LAB — OSMOLALITY, URINE: Osmolality, Ur: 362 mOsm/kg (ref 300–900)

## 2019-08-09 MED ORDER — HEPARIN SODIUM (PORCINE) 5000 UNIT/ML IJ SOLN
5000.0000 [IU] | Freq: Three times a day (TID) | INTRAMUSCULAR | Status: AC
  Administered 2019-08-09 (×2): 5000 [IU] via SUBCUTANEOUS
  Filled 2019-08-09 (×2): qty 1

## 2019-08-09 MED ORDER — LACTATED RINGERS IV SOLN: INTRAVENOUS | Status: DC

## 2019-08-09 MED ORDER — CEFAZOLIN SODIUM-DEXTROSE 2-4 GM/100ML-% IV SOLN: 2.0000 g | INTRAVENOUS | Status: DC

## 2019-08-09 MED ORDER — MAGNESIUM SULFATE 2 GM/50ML IV SOLN
2.0000 g | Freq: Once | INTRAVENOUS | Status: AC
  Administered 2019-08-09: 2 g via INTRAVENOUS
  Filled 2019-08-09: qty 50

## 2019-08-09 MED ORDER — DEXTROSE IN LACTATED RINGERS 5 % IV SOLN: INTRAVENOUS | Status: DC

## 2019-08-09 MED ORDER — CEFAZOLIN SODIUM-DEXTROSE 2-4 GM/100ML-% IV SOLN
2.0000 g | INTRAVENOUS | Status: AC
  Administered 2019-08-10: 2 g via INTRAVENOUS
  Filled 2019-08-09: qty 100

## 2019-08-09 MED ORDER — METOPROLOL SUCCINATE ER 25 MG PO TB24
12.5000 mg | ORAL_TABLET | Freq: Every day | ORAL | Status: DC
  Administered 2019-08-09 – 2019-08-10 (×2): 12.5 mg via ORAL
  Filled 2019-08-09 (×2): qty 1

## 2019-08-09 NOTE — TOC Progression Note (Signed)
Transition of Care [facility]) - Progression Note    Patient Details  Name: Anne [name] MRN: [id number] Date of Birth: 1956/01/04  Transition of Care [facility]) CM/SW Contact  Jacalyn Lefevre Edson Snowball, RN Phone Number: 08/09/2019, 1:58 PM  Clinical Narrative:     Ordered Rolator and 3 in1 with [facility].   Patient has decided on [facility] for home health. Referral given to [facility] with [facility]. Messaged Dr Court Joy for orders and face to face   Expected Discharge Plan: [place] Barriers to Discharge: Continued Medical Work up(BX Thursday)  Expected Discharge Plan and Services Expected Discharge Plan: [place]   Discharge Planning Services: CM Consult Post Acute Care Choice: Home Health, Durable Medical Equipment Living arrangements for the past 2 months: Single Family Home                           HH Arranged: PT, OT           Social Determinants of Health (SDOH) Interventions    Readmission Risk Interventions No flowsheet data found.

## 2019-08-09 NOTE — Plan of Care (Signed)
  Problem: Education: Goal: Knowledge of General Education information will improve Description: Including pain rating scale, medication(s)/side effects and non-pharmacologic comfort measures Outcome: Progressing   Problem: Clinical Measurements: Goal: Ability to maintain clinical measurements within normal limits will improve Outcome: Progressing Goal: Will remain free from infection Outcome: Progressing Goal: Respiratory complications will improve Outcome: Progressing Goal: Cardiovascular complication will be avoided Outcome: Progressing   Problem: Nutrition: Goal: Adequate nutrition will be maintained Outcome: Progressing   Problem: Coping: Goal: Level of anxiety will decrease Outcome: Progressing   Problem: Pain Managment: Goal: General experience of comfort will improve Outcome: Progressing   Problem: Safety: Goal: Ability to remain free from injury will improve Outcome: Progressing

## 2019-08-09 NOTE — Consult Note (Signed)
Reason for Consult:Right hip AVN Referring Physician: E Keaunna Skipper is an 64 y.o. female.  HPI: Anne [name] was admitted about 5d ago with BLE proximal weakness, L>R. Workup included a MRI that showed bilateral myositis and incidentally right AVN. Orthopedic surgery was consulted 2/2 latter. She notes the BLE weakness and pain have been going on for months but denies any problems with the right hip itself nor any trauma.  Past Medical History:  Diagnosis Date  . Coronary artery disease   . Crohn's colitis ([place])   . Hypokalemia 08/04/2019  . STEMI (ST elevation myocardial infarction) ([place])    10/27/17 PCI/DES x1 to the dRCA, with residual thrombus in the PLA, normal EF    Past Surgical History:  Procedure Laterality Date  . CORONARY/GRAFT ACUTE MI REVASCULARIZATION N/A 10/26/2017   Procedure: Coronary/Graft Acute MI Revascularization;  Surgeon: Troy Sine, MD;  Location: [facility];  Service: Cardiovascular;  Laterality: N/A;  . LEFT HEART CATH AND CORONARY ANGIOGRAPHY N/A 10/26/2017   Procedure: LEFT HEART CATH AND CORONARY ANGIOGRAPHY;  Surgeon: Troy Sine, MD;  Location: [facility];  Service: Cardiovascular;  Laterality: N/A;  . LEFT HEART CATH AND CORONARY ANGIOGRAPHY Left 05/05/2018   Procedure: Left heart cath and coronary angiography;  Surgeon: Dionisio David, MD;  Location: [facility];  Service: Cardiovascular;  Laterality: Left;  . SMALL INTESTINE SURGERY      History reviewed. No pertinent family history.  Social History:  reports that she quit smoking 13 days ago. She smoked 0.50 packs per day. She has never used smokeless tobacco. She reports that she does not drink alcohol or use drugs.  Allergies: No Known Allergies  Medications: I have reviewed the patient's current medications.  Results for orders placed or performed during the hospital encounter of 08/04/19 (from the past 48 hour(s))  CK     Status: Abnormal   Collection Time:  08/08/19  2:43 AM  Result Value Ref Range   Total CK 16,193 (H) 38 - 234 U/L    Comment: RESULTS CONFIRMED BY MANUAL DILUTION Performed at [facility], [address], [place], [place] [address]   Comprehensive metabolic panel     Status: Abnormal   Collection Time: 08/08/19  2:43 AM  Result Value Ref Range   Sodium 140 135 - 145 mmol/L   Potassium 3.3 (L) 3.5 - 5.1 mmol/L   Chloride 116 (H) 98 - 111 mmol/L   CO2 19 (L) 22 - 32 mmol/L   Glucose, Bld 100 (H) 70 - 99 mg/dL    Comment: Glucose reference range applies only to samples taken after fasting for at least 8 hours.   BUN 7 (L) 8 - 23 mg/dL   Creatinine, Ser 0.91 0.44 - 1.00 mg/dL   Calcium 7.3 (L) 8.9 - 10.3 mg/dL   Total Protein 5.1 (L) 6.5 - 8.1 g/dL   Albumin 2.1 (L) 3.5 - 5.0 g/dL   AST 468 (H) 15 - 41 U/L   ALT 428 (H) 0 - 44 U/L   Alkaline Phosphatase 112 38 - 126 U/L   Total Bilirubin 0.5 0.3 - 1.2 mg/dL   GFR calc non Af Amer >60 >60 mL/min   GFR calc Af Amer >60 >60 mL/min   Anion gap 5 5 - 15    Comment: Performed at [facility], [place] [address], [place], [place] [address]  CBC     Status: Abnormal  Collection Time: 08/08/19  2:43 AM  Result Value Ref Range   WBC 10.6 (H) 4.0 - 10.5 K/uL   RBC 3.93 3.87 - 5.11 MIL/uL   Hemoglobin 12.1 12.0 - 15.0 g/dL   HCT 35.1 (L) 36.0 - 46.0 %   MCV 89.3 80.0 - 100.0 fL   MCH 30.8 26.0 - 34.0 pg   MCHC 34.5 30.0 - 36.0 g/dL   RDW 15.8 (H) 11.5 - 15.5 %   Platelets 298 150 - 400 K/uL   nRBC 0.0 0.0 - 0.2 %    Comment: Performed at [facility] [address] [place], [place] [address]  Magnesium     Status: Abnormal   Collection Time: 08/08/19  2:43 AM  Result Value Ref Range   Magnesium 1.4 (L) 1.7 - 2.4 mg/dL    Comment: Performed at [facility] [address], [place], [place] [address]  Chloride, urine, random     Status: None   Collection Time: 08/08/19  9:04 AM  Result Value Ref Range   Chloride Urine 105 Not Estab. mmol/L     Comment: (NOTE) Performed At: [facility] [place], [place] [id number] Rush Farmer MD [id number] CORRECTED ON 04/28 AT 5329: PREVIOUSLY REPORTED AS 104   Na and K (sodium & potassium), rand urine     Status: None   Collection Time: 08/08/19  9:04 AM  Result Value Ref Range   Sodium, Ur 124 mmol/L   Potassium Urine 5 mmol/L    Comment: Performed at [facility], [place] [address], [place], [place] [address]  Calcium / creatinine ratio, urine     Status: None   Collection Time: 08/08/19  9:04 AM  Result Value Ref Range   Calcium, Ur 6.4 Not Estab. mg/dL   Calcium/Creat.Ratio 395 29 - 442 mg/g creat    Comment: (NOTE) Performed At: [facility] [place], [place] [id number] Rush Farmer MD [id number]    Creatinine, Urine 16.2 Not Estab. mg/dL  CK     Status: Abnormal   Collection Time: 08/09/19  1:25 AM  Result Value Ref Range   Total CK 13,841 (H) 38 - 234 U/L    Comment: RESULTS CONFIRMED BY MANUAL DILUTION Performed at [facility], [place] [address] [place], [place] [address]   Comprehensive metabolic panel     Status: Abnormal   Collection Time: 08/09/19  1:25 AM  Result Value Ref Range   Sodium 141 135 - 145 mmol/L   Potassium 4.1 3.5 - 5.1 mmol/L   Chloride 116 (H) 98 - 111 mmol/L   CO2 19 (L) 22 - 32 mmol/L   Glucose, Bld 100 (H) 70 - 99 mg/dL    Comment: Glucose reference range applies only to samples taken after fasting for at least 8 hours.   BUN 7 (L) 8 - 23 mg/dL   Creatinine, Ser 0.85 0.44 - 1.00 mg/dL   Calcium 7.7 (L) 8.9 - 10.3 mg/dL   Total Protein 4.7 (L) 6.5 - 8.1 g/dL   Albumin 2.1 (L) 3.5 - 5.0 g/dL   AST 327 (H) 15 - 41 U/L   ALT 301 (H) 0 - 44 U/L   Alkaline Phosphatase 103 38 - 126 U/L   Total Bilirubin 0.7 0.3 - 1.2 mg/dL   GFR calc non Af Amer >60 >60 mL/min   GFR calc Af Amer >60 >60 mL/min   Anion gap 6 5 - 15  Comment: Performed at [facility], [place] [address],  [place], [place] [address]  CBC     Status: Abnormal   Collection Time: 08/09/19  1:25 AM  Result Value Ref Range   WBC 11.7 (H) 4.0 - 10.5 K/uL   RBC 3.94 3.87 - 5.11 MIL/uL   Hemoglobin 12.4 12.0 - 15.0 g/dL   HCT 35.9 (L) 36.0 - 46.0 %   MCV 91.1 80.0 - 100.0 fL   MCH 31.5 26.0 - 34.0 pg   MCHC 34.5 30.0 - 36.0 g/dL   RDW 16.6 (H) 11.5 - 15.5 %   Platelets 294 150 - 400 K/uL   nRBC 0.0 0.0 - 0.2 %    Comment: Performed at [facility], [place] [address], [place], [place] [address]  Magnesium     Status: Abnormal   Collection Time: 08/09/19  1:25 AM  Result Value Ref Range   Magnesium 1.5 (L) 1.7 - 2.4 mg/dL    Comment: Performed at [facility] [address], [place], [place] [address]    MR ABDOMEN W WO CONTRAST  Result Date: 08/07/2019 CLINICAL DATA:  Evaluate hepatic lesions seen on recent ultrasound examination. EXAM: MRI ABDOMEN WITHOUT AND WITH CONTRAST TECHNIQUE: Multiplanar multisequence MR imaging of the abdomen was performed both before and after the administration of intravenous contrast. CONTRAST:  6.78m GADAVIST GADOBUTROL 1 MMOL/ML IV SOLN COMPARISON:  Abdominal ultrasound 08/04/2019 FINDINGS: Lower chest: The lung bases are clear of an acute process. No pulmonary lesions. No pleural or pericardial effusion. Hepatobiliary: The slightly lobulated 2.5 cm lesion in segment 6 has or increased T2 signal intensity and typical peripheral nodular enhancement with gradual filling in on delayed images. This is consistent with a benign hepatic hemangioma. No worrisome hepatic lesions are identified. No intrahepatic biliary dilatation. The gallbladder is surgically absent. No common bile duct dilatation. Pancreas:  No mass, inflammation or ductal dilatation. Spleen:  Normal size. No focal lesions. Adrenals/Urinary Tract:  The adrenal glands are normal. Numerous small bilateral renal cysts and a larger 3 cm upper pole right renal cyst. No worrisome renal lesions or hydronephrosis.  Stomach/Bowel: Visualized portions within the abdomen are unremarkable. Vascular/Lymphatic: No pathologically enlarged lymph nodes identified. No abdominal aortic aneurysm demonstrated. Other:  No ascites or abdominal wall hernia. Musculoskeletal: No significant bony findings. Advanced degenerate disc disease is noted at L4-5 with a moderate-sized central disc protrusion noted IMPRESSION: 1. 2.5 cm segment 6 liver lesion has MR imaging characteristics of a benign hepatic hemangioma. No worrisome hepatic lesions. 2. Numerous renal cysts. 3. Status post cholecystectomy. No biliary dilatation. 4. Advanced degenerate disc disease at L4-5 with a moderate-sized central disc protrusion noted. Electronically Signed   By: PMarijo SanesM.D.   On: 08/07/2019 16:42    Review of Systems  HENT: Negative for ear discharge, ear pain, hearing loss and tinnitus.   Eyes: Negative for photophobia and pain.  Respiratory: Negative for cough and shortness of breath.   Cardiovascular: Negative for chest pain.  Gastrointestinal: Negative for abdominal pain, nausea and vomiting.  Genitourinary: Negative for dysuria, flank pain, frequency and urgency.  Musculoskeletal: Positive for myalgias (Bilateral thigh L>R). Negative for back pain and neck pain.  Neurological: Positive for weakness (Bilateral thigh) and numbness (BLE, patchy). Negative for dizziness and headaches.  Hematological: Does not bruise/bleed easily.  Psychiatric/Behavioral: The patient is not nervous/anxious.    Blood pressure (!) 94/50, pulse 62, temperature 98.3 F (36.8 C), temperature source Oral, resp. rate 16,  height 5' 9"  (1.753 m), weight 67 kg, last menstrual period 02/18/2001, SpO2 100 %. Physical Exam  Constitutional: She appears well-developed and well-nourished. No distress.  HENT:  Head: Normocephalic and atraumatic.  Eyes: Conjunctivae are normal. Right eye exhibits no discharge. Left eye exhibits no discharge. No scleral icterus.   Cardiovascular: Normal rate and regular rhythm.  Respiratory: Effort normal. No respiratory distress.  Musculoskeletal:     Cervical back: Normal range of motion.     Comments: LLE No traumatic wounds, ecchymosis, or rash  Nontender, no pain with thigh flex/ext/int/ext rotation  No knee or ankle effusion  Knee stable to varus/ valgus and anterior/posterior stress  Sens DPN, SPN, TN intact  Motor EHL, ext, flex, evers 5/5  DP 2+, PT 1+, No significant edema  Neurological: She is alert.  Skin: Skin is warm and dry. She is not diaphoretic.  Psychiatric: She has a normal mood and affect. Her behavior is normal.    Assessment/Plan: Right hip AVN, asymptomatic -- This can most appropriately be followed-up in the outpatient setting. She may be WBAT BLE. F/u with Dr. Lyla Glassing at her convenience upon discharge.    Lisette Abu, PA-C Orthopedic Surgery [phone] 08/09/2019, 1:51 PM

## 2019-08-09 NOTE — Progress Notes (Signed)
Subjective:  Anne [name] is a 64 y.o. with PMH of CAD s/p PCI, Crohn's disease and spine disease with history of cervical fusion admitted for severe hypokalemia and asymmetric weakness on hospital day 4  Anne [name] was examined and evaluated at bedside this am. She mentions having no acute complaints. She was able to ambulate but states she did notice that her left leg continues to be weak. Discussed findings of MRI results and plan to f/u with muscle biopsy.  Objective:  Vital signs in last 24 hours: Vitals:   08/08/19 1430 08/08/19 2228 08/09/19 0500 08/09/19 0519  BP: 108/60 (!) 85/52  90/60  Pulse:  66  68  Resp:  18  18  Temp:  98 F (36.7 C)  98.3 F (36.8 C)  TempSrc:  Oral  Oral  SpO2:  100%  100%  Weight:   67 kg   Height:        General: NAD, nl appearance Cardiovascular: Normal rate, regular rhythm.  No murmurs, rubs, or gallops Pulmonary : Effort normal, breath sounds normal. No wheezes, rales, or rhonchi Abdominal: soft, nontender,  bowel sounds present Musculoskeletal: No tenderness on palpation. No deformities. Skin: heliotrope rash, no other noticeable rashes.  Psychiatric/Behavioral:  normal mood, normal behavior Neuro: Focal weakness in left proximal leg,motor 4/5 , 5/5 on right leg. no sensory deficits to light touch.    CBC Latest Ref Rng & Units 08/09/2019 08/08/2019 08/07/2019  WBC 4.0 - 10.5 K/uL 11.7(H) 10.6(H) 10.2  Hemoglobin 12.0 - 15.0 g/dL 12.4 12.1 12.6  Hematocrit 36.0 - 46.0 % 35.9(L) 35.1(L) 36.5  Platelets 150 - 400 K/uL 294 298 303   BMP Latest Ref Rng & Units 08/09/2019 08/08/2019 08/07/2019  Glucose 70 - 99 mg/dL 100(H) 100(H) 89  BUN 8 - 23 mg/dL 7(L) 7(L) 9  Creatinine 0.44 - 1.00 mg/dL 0.85 0.91 1.10(H)  Sodium 135 - 145 mmol/L 141 140 143  Potassium 3.5 - 5.1 mmol/L 4.1 3.3(L) 4.4  Chloride 98 - 111 mmol/L 116(H) 116(H) 117(H)  CO2 22 - 32 mmol/L 19(L) 19(L) 20(L)  Calcium 8.9 - 10.3 mg/dL 7.7(L) 7.3(L) 7.2(L)   Lab Results    Component Value Date   CKTOTAL 13,841 (H) 08/09/2019     Assessment/Plan:  Principal Problem:   Hypokalemia Active Problems:   AKI (acute kidney injury) ([place])   Generalized weakness   Prolonged Q-T interval on ECG   Malnutrition of moderate degree  Anne [name] is a 64 year old female with coronary artery disease status post PCI, Crohn's disease off treatment, and spine disease with history of cervical fusion who presented with hypokalemia from GI losses 2/2 Crohn's disease.  Myositis Persistent Hypokalemia, Hyperchloremic  NAGMA Patient coming in with hypokalemia and a history of chronic diarrhea secondary to being off treatment for Crohn's disease.  However patient has normal bowel movements on hospital stay and CRP within normal limits. It is always possible patient total potassium stores are decreased secondary to chronic diarrhea secondary to being off treatment for Crohn's disease.  Initially had prerenal AKI which quickly improved with fluids.  Consistent NAGMA concerning for RTA in setting of no renal disease and resolution of diarrhea.  Patient's other presenting symptom was weakness, most notable in left proximal leg and hip. MRI 04/25 showed diffuse myositis of pelvic girdle muscles with symmetric involvement.  CK trended as follows: ( CK 10.4>13.5K>17.1K>16.1K>13.8. Workup for myositis has included ESR (minimally elevated) and CRP (normal). AST and ALT consistent with muscle damage. Alk phos  is at the high limit of normal and GGT elevated outside nl range as well. Gallbladder surgically absent and no common bile duct dilation. Avascular necrosis of right hip noted on MR Pelvis. Working differential includes dermatomyositis, polymyositis, necrotizing immune myositis, and inclusion body myositis. Patient has a heliotrope rash, consistent with dermatomyositis. No other rashes or skin changes.  Patient was on a statin and it has been held. ANA negative. - Hypokalemia leading to  rhabdomyolysis is most likely cause of patient's myositis. Chronic depletion vs possible RTA are leading etiologies. Potassium is released from cells causing vasodilation during normal exercise, depletion causes decreased blood supply due to vasoconstriction and therefore ischemic myopathy. Muscle biopsy should show IBM , this would be consistent with current differential. In addition Urine Ca/Cr should be back tomorrow and will help further evidence of hypokalemic RTA. Urine PH 6, urine anion gap 25. Will calculate urine osmole gap when urine osm study returns.  -Will consider further workup with myositis panel if needed with results of muscle biopsy. Consulted general surgery on 4/26, asked to hold Brilinta, and they will take patient for muscle biopsy on 4/29 - notable labs today  Plan: - surgery consulted, greatly appreciate there assistance -Trend CK - Trend LFTs - LR @ 250 ml/hr - Urine osmolality  -  Urine Ca/Cr ratio - morning cortisol    Elevated ALT, AST Patient's LFT's are likely related to problem #1 above, however will check autoimmune hepatitis panel. MRI abdomen showed right hepatic mass seen on ultrasound is benign hepatic hemangioma. Hep B negative, and Hep C negative. HIV negative.  Plan:  - Trend CMP - F/u  autoimmune hepatitis labs  Crohn's disease Thought hypokalemia likely to flair of Crohn's disease.  Patient has been asymptomatic on admission and CRP normal. She has not been on Azathioprine for 2 years, reports she stopped following with her GI doctor.  We will hold off on restarting her azathioprine and recommend follow-up with her gastroenterologist. Plan: - Continue to monitor symptoms   Avascular Necrosis of right hip Serpiginous subchondral sclerosis of the superior right humeral head noted on MR pelvis.  Patient is asymptomatic, but will contact Ortho to evaluate patient while in the hospital for consideration of future surgery. - Consult Ortho  CAD Patient  had Left heart catheterization done in July 2019 with stent placement in distal RCA Plan: - hold Crestor 40 mg nightly - Aspirin 81 mg daily - Metoprolol 12.5 mg daily - hold Brilinta 60 mg  Low BP SBP in 90's consistently. Asymptomatic . Plan: - Decreased metoprolol 25 mg to 12.5 mg daily.   DVT prophx: Heparin Diet: regular diet, NPO at midnight Bowel: n/a Code: Full  Dispo: Anticipated discharge in approximately 1-2 day(s).   Tamsen Snider, MD PGY1

## 2019-08-09 NOTE — Plan of Care (Signed)
  Problem: Education: Goal: Knowledge of General Education information will improve Description Including pain rating scale, medication(s)/side effects and non-pharmacologic comfort measures Outcome: Progressing   

## 2019-08-09 NOTE — Progress Notes (Signed)
    CC: Generalized weakness  Subjective: Up in the chair no new complaints.  Objective: Vital signs in last 24 hours: Temp:  [98 F (36.7 C)-98.3 F (36.8 C)] 98.3 F (36.8 C) (04/28 0519) Pulse Rate:  [66-80] 68 (04/28 0519) Resp:  [16-18] 16 (04/28 0856) BP: (85-137)/(52-119) 90/60 (04/28 0519) SpO2:  [100 %] 100 % (04/28 0519) Weight:  [67 kg] 67 kg (04/28 0500) Last BM Date: 08/08/19  Afebrile vital signs are stable CKs remain elevated, WBC 11.7, LFTs elevated but stable.  Intake/Output from previous day: 04/27 0701 - 04/28 0700 In: 7423.7 [I.V.:7423.7] Out: 1500 [Urine:1500] Intake/Output this shift: Total I/O In: 480 [P.O.:480] Out: 400 [Urine:400]  General appearance: alert, cooperative and no distress Resp: clear to auscultation bilaterally  Lab Results:  Recent Labs    08/08/19 0243 08/09/19 0125  WBC 10.6* 11.7*  HGB 12.1 12.4  HCT 35.1* 35.9*  PLT 298 294    BMET Recent Labs    08/08/19 0243 08/09/19 0125  NA 140 141  K 3.3* 4.1  CL 116* 116*  CO2 19* 19*  GLUCOSE 100* 100*  BUN 7* 7*  CREATININE 0.91 0.85  CALCIUM 7.3* 7.7*   PT/INR No results for input(s): LABPROT, INR in the last 72 hours.  Recent Labs  Lab 08/04/19 1226 08/06/19 1521 08/08/19 0243 08/09/19 0125  AST 337* 394* 468* 327*  ALT 338* 276* 428* 301*  ALKPHOS 142* 118 112 103  BILITOT 0.7 0.5 0.5 0.7  PROT 7.1 5.2* 5.1* 4.7*  ALBUMIN 3.3* 2.3* 2.1* 2.1*     Lipase     Component Value Date/Time   LIPASE 95 (H) 08/04/2019 1226     Medications: . aspirin EC  81 mg Oral Daily  . cholecalciferol  1,000 Units Oral Daily  . feeding supplement  1 Container Oral TID BM  . heparin  5,000 Units Subcutaneous Q8H  . metoprolol succinate  12.5 mg Oral Daily  . multivitamin with minerals  1 tablet Oral Daily    Assessment/Plan Hx CAD/PCI -aspirin/Brilinta (EF50-55%, mild MR - Echo 10/2017) Hx Crohn's disease -1984 Hx lumbar radiculopathy Right hepatic  hemangioma Lumbar disc disease with compression  Generalized weakness lower extremity weakness left greater than right;  with a history of right-sided lumbar surgery 2014 Marked CK elevationWe have been asked to a muscle biopsy on the left thigh.   FEN: Regular diet ID: None DVT:  Heparin - will hold after MN   Plan: Muscle biopsy tomorrow.  N.p.o. after midnight, Ancef on-call to the OR, hold heparin after midnight, permit for left thigh muscle biopsy completed.  IV fluids to start at 2300 hrs. tonight.  LOS: 4 days    Paulo Keimig 08/09/2019 Please see Amion

## 2019-08-09 NOTE — Progress Notes (Signed)
Physical Therapy Treatment Patient Details Name: Anne [name] MRN: [id number] DOB: [birth year] Today's Date: 08/09/2019    History of Present Illness Ms. Galster is a 64 year old woman with PMH of CAD, Crohn's Disease, chronic bulging disc lumbar spine who presents for decreased appetite and generalized weakness. MRI of the pelvis-diffuse symmetric edematous myositis of the pelvic girdle musculature general involving all the musculature except the gluteus maximus. Serpiginous subchondral sclerosis of the superior right humeral head, in keeping with AVN. Also found to have elevated CKs, very low K, prolonged Qtc.    PT Comments    Pt seated in recliner.  Continues to reports weakness in L thigh,  Focused on gait training and L qaud exercises.  Plan for stair training and use of rollator next session.     Follow Up Recommendations  Home health PT;Supervision for mobility/OOB;Supervision - Intermittent     Equipment Recommendations  Other (comment);3in1 (PT)(rollator ( 4 wheeled rolling walker with a seat))    Recommendations for Other Services       Precautions / Restrictions Precautions Precautions: Fall Precaution Comments: new L sided weakness. Restrictions Weight Bearing Restrictions: No    Mobility  Bed Mobility               General bed mobility comments: Sitting in chair upon PT arrival.  Transfers Overall transfer level: Needs assistance Equipment used: Rolling walker (2 wheeled) Transfers: Sit to/from Stand Sit to Stand: Supervision         General transfer comment: Supervision for safety.  Ambulation/Gait Ambulation/Gait assistance: Min guard Gait Distance (Feet): 100 Feet Assistive device: Rolling walker (2 wheeled) Gait Pattern/deviations: Step-through pattern;Trunk flexed;Decreased stride length;Steppage Gait velocity: decreased   General Gait Details: No buckling noted during session and good safety with device.  Expressed concern for need for  rollator to seated rest breaks when left side fatigues.   Stairs             Wheelchair Mobility    Modified Rankin (Stroke Patients Only)       Balance Overall balance assessment: Needs assistance Sitting-balance support: Feet supported;No upper extremity supported Sitting balance-Leahy Scale: Good       Standing balance-Leahy Scale: Fair                              Cognition Arousal/Alertness: Awake/alert Behavior During Therapy: WFL for tasks assessed/performed;Flat affect Overall Cognitive Status: Within Functional Limits for tasks assessed                                        Exercises General Exercises - Lower Extremity Quad Sets: AROM;Left;10 reps;Supine Long Arc Quad: AROM;Left;10 reps;Seated Heel Slides: AAROM;Left;10 reps;Supine Hip Flexion/Marching: AROM;Left;10 reps;Seated;Limitations Hip Flexion/Marching Limitations: limited ROM with poor eccentric work of muscle.    General Comments        Pertinent Vitals/Pain Pain Assessment: Faces Faces Pain Scale: Hurts a little bit Pain Location: LLE Pain Descriptors / Indicators: Aching;Numbness Pain Intervention(s): Monitored during session;Repositioned    Home Living                      Prior Function            PT Goals (current goals can now be found in the care plan section) Acute Rehab PT Goals Patient Stated Goal: to get  stronger Potential to Achieve Goals: Good Progress towards PT goals: Progressing toward goals    Frequency    Min 3X/week      PT Plan Current plan remains appropriate    Co-evaluation              AM-PAC PT "6 Clicks" Mobility   Outcome Measure  Help needed turning from your back to your side while in a flat bed without using bedrails?: None Help needed moving from lying on your back to sitting on the side of a flat bed without using bedrails?: None Help needed moving to and from a bed to a chair (including a  wheelchair)?: A Little Help needed standing up from a chair using your arms (e.g., wheelchair or bedside chair)?: A Little Help needed to walk in hospital room?: A Little Help needed climbing 3-5 steps with a railing? : A Little 6 Click Score: 20    End of Session Equipment Utilized During Treatment: Gait belt Activity Tolerance: Patient tolerated treatment well Patient left: in chair;with call bell/phone within reach(RN reports she does not need a chair alarm when PTA asked.) Nurse Communication: Mobility status PT Visit Diagnosis: Unsteadiness on feet (R26.81);Other abnormalities of gait and mobility (R26.89);Muscle weakness (generalized) (M62.81)     Time: 7092-9574 PT Time Calculation (min) (ACUTE ONLY): 15 min  Charges:  $Gait Training: 8-22 mins                     Erasmo Leventhal , PTA Acute Rehabilitation Services Pager [phone] Office [phone]     Galya Dunnigan Eli Hose 08/09/2019, 12:49 PM

## 2019-08-10 ENCOUNTER — Encounter (HOSPITAL_COMMUNITY): Payer: Self-pay | Admitting: Internal Medicine

## 2019-08-10 ENCOUNTER — Inpatient Hospital Stay (HOSPITAL_COMMUNITY): Payer: Medicare Other | Admitting: Anesthesiology

## 2019-08-10 ENCOUNTER — Encounter (HOSPITAL_COMMUNITY)
Admission: EM | Disposition: A | Discharge status: Home Health | Payer: Self-pay | Source: Home / Self Care | Attending: Internal Medicine

## 2019-08-10 HISTORY — PX: MUSCLE BIOPSY: SHX716

## 2019-08-10 LAB — BASIC METABOLIC PANEL
Anion gap: 4 — ABNORMAL LOW (ref 5–15)
BUN: 10 mg/dL (ref 8–23)
CO2: 20 mmol/L — ABNORMAL LOW (ref 22–32)
Calcium: 8.3 mg/dL — ABNORMAL LOW (ref 8.9–10.3)
Chloride: 116 mmol/L — ABNORMAL HIGH (ref 98–111)
Creatinine, Ser: 0.91 mg/dL (ref 0.44–1.00)
GFR calc Af Amer: >60 mL/min (ref >60)
GFR calc non Af Amer: >60 mL/min (ref >60)
Glucose, Bld: 112 mg/dL — ABNORMAL HIGH (ref 70–99)
Potassium: 3.7 mmol/L (ref 3.5–5.1)
Sodium: 140 mmol/L (ref 135–145)

## 2019-08-10 LAB — SURGICAL PCR SCREEN
MRSA, PCR: POSITIVE — AB
Staphylococcus aureus: POSITIVE — AB

## 2019-08-10 LAB — CBC
HCT: 32.9 % — ABNORMAL LOW (ref 36.0–46.0)
Hemoglobin: 11.3 g/dL — ABNORMAL LOW (ref 12.0–15.0)
MCH: 30.8 pg (ref 26.0–34.0)
MCHC: 34.3 g/dL (ref 30.0–36.0)
MCV: 89.6 fL (ref 80.0–100.0)
Platelets: 309 10*3/uL (ref 150–400)
RBC: 3.67 MIL/uL — ABNORMAL LOW (ref 3.87–5.11)
RDW: 16.7 % — ABNORMAL HIGH (ref 11.5–15.5)
WBC: 12 10*3/uL — ABNORMAL HIGH (ref 4.0–10.5)
nRBC: 0 % (ref 0.0–0.2)

## 2019-08-10 LAB — MAGNESIUM: Magnesium: 2 mg/dL (ref 1.7–2.4)

## 2019-08-10 LAB — CK: Total CK: 8503 U/L — ABNORMAL HIGH (ref 38–234)

## 2019-08-10 LAB — CORTISOL-AM, BLOOD: Cortisol - AM: 13 ug/dL (ref 6.7–22.6)

## 2019-08-10 LAB — PROTIME-INR
INR: 1.1 (ref 0.8–1.2)
Prothrombin Time: 13.7 seconds (ref 11.4–15.2)

## 2019-08-10 SURGERY — MUSCLE BIOPSY
Anesthesia: General | Laterality: Left

## 2019-08-10 MED ORDER — BUPIVACAINE-EPINEPHRINE (PF) 0.5% -1:200000 IJ SOLN
INTRAMUSCULAR | Status: DC | PRN
  Administered 2019-08-10: 10 mL via PERINEURAL

## 2019-08-10 MED ORDER — MIDAZOLAM HCL 2 MG/2ML IJ SOLN
INTRAMUSCULAR | Status: DC | PRN
  Administered 2019-08-10: 2 mg via INTRAVENOUS

## 2019-08-10 MED ORDER — PHENYLEPHRINE 40 MCG/ML (10ML) SYRINGE FOR IV PUSH (FOR BLOOD PRESSURE SUPPORT)
PREFILLED_SYRINGE | INTRAVENOUS | Status: DC | PRN
  Administered 2019-08-10: 120 ug via INTRAVENOUS
  Administered 2019-08-10: 160 ug via INTRAVENOUS
  Administered 2019-08-10: 120 ug via INTRAVENOUS

## 2019-08-10 MED ORDER — LIDOCAINE 2% (20 MG/ML) 5 ML SYRINGE
INTRAMUSCULAR | Status: DC | PRN
  Administered 2019-08-10: 40 mg via INTRAVENOUS

## 2019-08-10 MED ORDER — PROPOFOL 10 MG/ML IV BOLUS
INTRAVENOUS | Status: AC
  Filled 2019-08-10: qty 20

## 2019-08-10 MED ORDER — CHLORHEXIDINE GLUCONATE CLOTH 2 % EX PADS
6.0000 | MEDICATED_PAD | Freq: Every day | CUTANEOUS | Status: DC
  Administered 2019-08-11 – 2019-08-12 (×2): 6 via TOPICAL

## 2019-08-10 MED ORDER — FENTANYL CITRATE (PF) 250 MCG/5ML IJ SOLN
INTRAMUSCULAR | Status: AC
  Filled 2019-08-10: qty 5

## 2019-08-10 MED ORDER — PROPOFOL 10 MG/ML IV BOLUS
INTRAVENOUS | Status: DC | PRN
  Administered 2019-08-10: 100 mg via INTRAVENOUS

## 2019-08-10 MED ORDER — MUPIROCIN 2 % EX OINT
1.0000 "application " | TOPICAL_OINTMENT | Freq: Two times a day (BID) | CUTANEOUS | Status: DC
  Administered 2019-08-10 – 2019-08-12 (×5): 1 via NASAL
  Filled 2019-08-10: qty 22

## 2019-08-10 MED ORDER — HYDROCODONE-ACETAMINOPHEN 5-325 MG PO TABS: 1.0000 | ORAL_TABLET | ORAL | Status: DC | PRN

## 2019-08-10 MED ORDER — FENTANYL CITRATE (PF) 100 MCG/2ML IJ SOLN
INTRAMUSCULAR | Status: DC | PRN
  Administered 2019-08-10: 50 ug via INTRAVENOUS

## 2019-08-10 MED ORDER — POTASSIUM CHLORIDE CRYS ER 20 MEQ PO TBCR
30.0000 meq | EXTENDED_RELEASE_TABLET | Freq: Once | ORAL | Status: AC
  Administered 2019-08-10: 30 meq via ORAL
  Filled 2019-08-10: qty 1

## 2019-08-10 MED ORDER — PHENYLEPHRINE 40 MCG/ML (10ML) SYRINGE FOR IV PUSH (FOR BLOOD PRESSURE SUPPORT)
PREFILLED_SYRINGE | INTRAVENOUS | Status: AC
  Filled 2019-08-10: qty 20

## 2019-08-10 MED ORDER — EPHEDRINE SULFATE-NACL 50-0.9 MG/10ML-% IV SOSY
PREFILLED_SYRINGE | INTRAVENOUS | Status: DC | PRN
  Administered 2019-08-10 (×2): 10 mg via INTRAVENOUS

## 2019-08-10 MED ORDER — DEXAMETHASONE SODIUM PHOSPHATE 10 MG/ML IJ SOLN
INTRAMUSCULAR | Status: DC | PRN
Start: 2019-08-10 — End: 2019-08-10
  Administered 2019-08-10: 4 mg via INTRAVENOUS

## 2019-08-10 MED ORDER — LIDOCAINE 2% (20 MG/ML) 5 ML SYRINGE
INTRAMUSCULAR | Status: AC
  Filled 2019-08-10: qty 5

## 2019-08-10 MED ORDER — ONDANSETRON HCL 4 MG/2ML IJ SOLN
INTRAMUSCULAR | Status: AC
  Filled 2019-08-10: qty 4

## 2019-08-10 MED ORDER — ONDANSETRON HCL 4 MG/2ML IJ SOLN
INTRAMUSCULAR | Status: DC | PRN
  Administered 2019-08-10: 4 mg via INTRAVENOUS

## 2019-08-10 MED ORDER — SODIUM BICARBONATE 650 MG PO TABS
1950.0000 mg | ORAL_TABLET | Freq: Two times a day (BID) | ORAL | Status: DC
  Administered 2019-08-10 – 2019-08-12 (×5): 1950 mg via ORAL
  Filled 2019-08-10 (×5): qty 3

## 2019-08-10 MED ORDER — MIDAZOLAM HCL 2 MG/2ML IJ SOLN
INTRAMUSCULAR | Status: AC
  Filled 2019-08-10: qty 2

## 2019-08-10 MED ORDER — DEXAMETHASONE SODIUM PHOSPHATE 10 MG/ML IJ SOLN
INTRAMUSCULAR | Status: AC
  Filled 2019-08-10: qty 2

## 2019-08-10 SURGICAL SUPPLY — 24 items
CANISTER SUCT 3000ML PPV (MISCELLANEOUS) ×3 IMPLANT
CHLORAPREP W/TINT 26 (MISCELLANEOUS) ×3 IMPLANT
CNTNR URN SCR LID CUP LEK RST (MISCELLANEOUS) ×1 IMPLANT
CONT SPEC 4OZ STRL OR WHT (MISCELLANEOUS) ×2
COVER SURGICAL LIGHT HANDLE (MISCELLANEOUS) ×3 IMPLANT
DERMABOND ADVANCED (GAUZE/BANDAGES/DRESSINGS) ×2
DERMABOND ADVANCED .7 DNX12 (GAUZE/BANDAGES/DRESSINGS) ×1 IMPLANT
DRAPE LAPAROTOMY 100X72 PEDS (DRAPES) ×3 IMPLANT
DRSG TELFA 3X8 NADH (GAUZE/BANDAGES/DRESSINGS) ×3 IMPLANT
ELECT REM PT RETURN 9FT ADLT (ELECTROSURGICAL) ×3
ELECTRODE REM PT RTRN 9FT ADLT (ELECTROSURGICAL) ×1 IMPLANT
GAUZE 4X4 16PLY RFD (DISPOSABLE) ×3 IMPLANT
GLOVE SURG SIGNA 7.5 PF LTX (GLOVE) ×3 IMPLANT
GOWN STRL REUS W/ TWL LRG LVL3 (GOWN DISPOSABLE) ×2 IMPLANT
GOWN STRL REUS W/TWL LRG LVL3 (GOWN DISPOSABLE) ×4
KIT BASIN OR (CUSTOM PROCEDURE TRAY) ×3 IMPLANT
KIT TURNOVER KIT B (KITS) ×3 IMPLANT
NEEDLE HYPO 25GX1X1/2 BEV (NEEDLE) ×3 IMPLANT
NS IRRIG 1000ML POUR BTL (IV SOLUTION) ×3 IMPLANT
PACK GENERAL/GYN (CUSTOM PROCEDURE TRAY) ×3 IMPLANT
PENCIL SMOKE EVACUATOR (MISCELLANEOUS) ×3 IMPLANT
SUT MON AB 4-0 PC3 18 (SUTURE) ×3 IMPLANT
SUT VIC AB 3-0 SH 18 (SUTURE) ×3 IMPLANT
TOWEL GREEN STERILE FF (TOWEL DISPOSABLE) ×3 IMPLANT

## 2019-08-10 NOTE — Anesthesia Procedure Notes (Signed)
Procedure Name: LMA Insertion Date/Time: 08/10/2019 1:47 PM Performed by: Leonor Liv, CRNA Pre-anesthesia Checklist: Patient identified, Emergency Drugs available, Suction available and Patient being monitored Patient Re-evaluated:Patient Re-evaluated prior to induction Oxygen Delivery Method: Circle System Utilized Preoxygenation: Pre-oxygenation with 100% oxygen Induction Type: IV induction Ventilation: Mask ventilation without difficulty LMA: LMA inserted LMA Size: 4.0 Number of attempts: 1 Placement Confirmation: positive ETCO2 Tube secured with: Tape Dental Injury: Teeth and Oropharynx as per pre-operative assessment

## 2019-08-10 NOTE — Transfer of Care (Signed)
Immediate Anesthesia Transfer of Care Note  Patient: Anne [name]  Procedure(s) Performed: THIGH MUSCLE BIOPSY (Left )  Patient Location: PACU  Anesthesia Type:General  Level of Consciousness: awake, alert  and oriented  Airway & Oxygen Therapy: Patient Spontanous Breathing and Patient connected to nasal cannula oxygen  Post-op Assessment: Report given to RN, Post -op Vital signs reviewed and stable and Patient moving all extremities  Post vital signs: Reviewed and stable  Last Vitals:  Vitals Value Taken Time  BP 94/58 08/10/19 1415  Temp    Pulse 87 08/10/19 1417  Resp 24 08/10/19 1417  SpO2 100 % 08/10/19 1417  Vitals shown include unvalidated device data.  Last Pain:  Vitals:   08/10/19 0853  TempSrc:   PainSc: 0-No pain      Patients Stated Pain Goal: 2 (69/24/93 2419)  Complications: No apparent anesthesia complications

## 2019-08-10 NOTE — Progress Notes (Addendum)
Subjective:  SAE Anne [name] is a 64 y.o. with PMH of CAD s/p PCI, Crohn's disease and spine disease with history of cervical fusion admitted for severe hypokalemia and asymmetric weakness on hospital day 5  Anne [name] was examined and evaluated at bedside this am. Discussed findings of her urinary labs and plan to start treatment. She mentions 'mushy' bowel movement but denies any diarrhea. She mentions that her weakness appear to be improving and discussed trend of her CK overnight.  Objective:  Vital signs in last 24 hours: Vitals:   08/09/19 1058 08/09/19 1418 08/09/19 2014 08/10/19 0417  BP: (!) 94/50 100/64 90/66 101/64  Pulse: 62 81 70 66  Resp:  18 18 16   Temp:  98.8 F (37.1 C) 98.2 F (36.8 C) 98.2 F (36.8 C)  TempSrc:  Oral Oral Oral  SpO2:  100% 99% 99%  Weight:      Height:        General: NAD, nl appearance Cardiovascular: Normal rate, regular rhythm.  No murmurs, rubs, or gallops Pulmonary : Effort normal, breath sounds normal. No wheezes, rales, or rhonchi Abdominal: soft, nontender,  bowel sounds present Musculoskeletal: No tenderness on palpation. No deformities. Skin: heliotrope rash, no other noticeable rashes.  Psychiatric/Behavioral:  normal mood, normal behavior Neuro: Focal weakness in left proximal leg,motor 4/5 (mild improvement overnight) , 5/5 on right leg. no sensory deficits to light touch.    CBC Latest Ref Rng & Units 08/10/2019 08/09/2019 08/08/2019  WBC 4.0 - 10.5 K/uL 12.0(H) 11.7(H) 10.6(H)  Hemoglobin 12.0 - 15.0 g/dL 11.3(L) 12.4 12.1  Hematocrit 36.0 - 46.0 % 32.9(L) 35.9(L) 35.1(L)  Platelets 150 - 400 K/uL 309 294 298   BMP Latest Ref Rng & Units 08/10/2019 08/09/2019 08/08/2019  Glucose 70 - 99 mg/dL 112(H) 100(H) 100(H)  BUN 8 - 23 mg/dL 10 7(L) 7(L)  Creatinine 0.44 - 1.00 mg/dL 0.91 0.85 0.91  Sodium 135 - 145 mmol/L 140 141 140  Potassium 3.5 - 5.1 mmol/L 3.7 4.1 3.3(L)  Chloride 98 - 111 mmol/L 116(H) 116(H) 116(H)  CO2 22 - 32  mmol/L 20(L) 19(L) 19(L)  Calcium 8.9 - 10.3 mg/dL 8.3(L) 7.7(L) 7.3(L)   Lab Results  Component Value Date   CKTOTAL 8,503 (H) 08/10/2019     Assessment/Plan:  Principal Problem:   Hypokalemia Active Problems:   AKI (acute kidney injury) ([place])   Generalized weakness   Prolonged Q-T interval on ECG   Malnutrition of moderate degree  Anne [name] is a 64 year old female with coronary artery disease status post PCI, Crohn's disease off treatment, and spine disease with history of cervical fusion who presented with hypokalemia from GI losses 2/2 Crohn's disease.  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  However patient has normal bowel movements on hospital stay and CRP within normal limits. It is always possible patient total potassium stores are decreased secondary to chronic diarrhea secondary to being off treatment for Crohn's disease.  Initially had prerenal AKI which quickly improved with fluids.  Consistent NAGMA concerning for RTA in setting of no renal disease and no current diarrhea.  Patient's other presenting symptom was weakness, most notable in left proximal leg and hip. MRI 04/25 showed diffuse myositis of pelvic girdle muscles with symmetric involvement.  CK trended as follows: ( CK 10.4>13.5K>17.1K>16.1K>13.8, today 8K). Workup for myositis has included ESR (minimally elevated) and CRP (normal). AST and  ALT consistent with muscle damage. Alk phos is at the high limit of normal and GGT elevated outside nl range as well. Gallbladder surgically absent and no common bile duct dilation. Avascular necrosis of right hip noted on MR Pelvis. Working differential includes dermatomyositis, polymyositis, necrotizing immune myositis, and inclusion body myositis. Patient has a heliotrope rash, consistent with dermatomyositis. No other rashes or skin changes.  Patient was  on a statin and it has been held. ANA negative. - Hypokalemia leading to rhabdomyolysis is most likely cause of patient's myositis. Chronic depletion vs possible RTA are leading etiologies. Potassium is released from cells causing vasodilation during normal exercise, depletion causes decreased blood supply due to vasoconstriction and therefore ischemic myopathy. Muscle biopsy should show IBM , this would be consistent with current differential.  - Labs consistent with Distal RTA: Urine PH 6, urine anion gap 25, urine osmole gap 100, urine Ca/Cr ration upper limit of normal.  -The urine anion gap 25 is consistent with Distal RTA, this is secondary to low chloride excretion as a result of the impaired urinary ammonium excretion. The urine osmole gap 100 and is also consistent with RTA, I would expect the urine osmol gap to be greater with a diarrhea.  Other notable labs: Cortisol wnl Plan: - surgery consulted, greatly appreciate there assistance, biopsy today -Trend CK - Trend LFTs - LR @ 250 ml/hr ( D5 while NPO)   - Sodium Bicarbonate 1,940m (40 meq) BID - Follow up aldosterone/Renin   Elevated ALT, AST Patient's LFT's are likely related to problem #1 above, however will check autoimmune hepatitis panel. MRI abdomen showed right hepatic mass seen on ultrasound is benign hepatic hemangioma. Hep B negative, and Hep C negative. HIV negative.  Plan:  - Trend CMP - F/u  autoimmune hepatitis labs  Crohn's disease Thought hypokalemia likely to flair of Crohn's disease.  Patient has been asymptomatic on admission and CRP normal. She has not been on Azathioprine for 2 years, reports she stopped following with her GI doctor.  We will hold off on restarting her azathioprine and recommend follow-up with her gastroenterologist.  Plan: - Continue to monitor symptoms   Avascular Necrosis of right hip Serpiginous subchondral sclerosis of the superior right humeral head noted on MR pelvis.  Patient is  asymptomatic. Seen by ortho and they recommended follow up with Dr.Swinteck in the outpatient setting.   CAD Patient had Left heart catheterization done in July 2019 with stent placement in distal RCA. Came in on metoprolol succinate 25 mg, decreased due to hypotension, without symptoms.  Plan: - hold Crestor 40 mg nightly - Aspirin 81 mg daily - Metoprolol 12.5 mg daily - hold Brilinta 60 mg   DVT prophx: Heparin Diet: regular diet, NPO at midnight Bowel: n/a Code: Full  Dispo: Anticipated discharge in approximately 1-2 day(s).   JTamsen Snider MD PGY1

## 2019-08-10 NOTE — Progress Notes (Signed)
Nutrition Follow-up  DOCUMENTATION CODES:   Non-severe (moderate) malnutrition in context of chronic illness  INTERVENTION:   -Continue Boost Breeze po TID, each supplement provides 250 kcal and 9 grams of protein -Continue MVI with minerals daily  NUTRITION DIAGNOSIS:   Moderate Malnutrition related to chronic illness(Crohn's disease) as evidenced by mild fat depletion, mild muscle depletion, moderate muscle depletion.  Ongoing  GOAL:   Patient will meet greater than or equal to 90% of their needs  Progressing   MONITOR:   PO intake, Supplement acceptance, Labs, Weight trends, Skin, I & O's  REASON FOR ASSESSMENT:   Malnutrition Screening Tool    ASSESSMENT:   64 year old female who presented on 4/23 with generalized weakness and decreased appetite. PMH of CAD s/p PCI, Crohn's disease, diarrhea. Pt admitted with hypokalemia.  Reviewed I/O's: +3.1 L x 24 hours and +12.1 L since admission  UOP: 1.3 L x 24 hours  Pt currently NPO for lt thigh muscle biopsy to rule out myositis.   Spoke with pt at bedside, who was pleasant and in good spirits today. She understands that she is NPO for upcoming procedure. Per her report, her intake has improved and is consuming at least half of her meals (noted meal completion 25-100%). She is consuming her Boost Breeze supplements and would like to continue these. Discussed importance of good meal and supplement intake to promote healing.   Labs reviewed.   Diet Order:   Diet Order    None      EDUCATION NEEDS:   Education needs have been addressed  Skin:  Skin Assessment: Skin Integrity Issues: Skin Integrity Issues:: Incisions Incisions: closed rt thigh Other: open wound to right vagina  Last BM:  08/09/19  Height:   Ht Readings from Last 1 Encounters:  08/04/19 5' 9"  (1.753 m)    Weight:   Wt Readings from Last 1 Encounters:  08/09/19 67 kg    Ideal Body Weight:  65.9 kg  BMI:  Body mass index is 21.81  kg/m.  Estimated Nutritional Needs:   Kcal:  1650-1850  Protein:  80-95 grams  Fluid:  1.6-1.8 L    Loistine Chance, RD, LDN, [place] Registered Dietitian II Certified Diabetes Care and Education Specialist Please refer to [facility] for RD and/or RD on-call/weekend/after hours pager

## 2019-08-10 NOTE — Anesthesia Preprocedure Evaluation (Addendum)
Anesthesia Evaluation  Patient identified by MRN, date of birth, ID band Patient awake    Reviewed: Allergy & Precautions, NPO status , Patient's Chart, lab work & pertinent test results  Airway Mallampati: II  TM Distance: >3 FB Neck ROM: Full    Dental  (+) Edentulous Upper, Dental Advisory Given   Pulmonary former smoker,    Pulmonary exam normal breath sounds clear to auscultation       Cardiovascular + CAD and + Past MI  Normal cardiovascular exam Rhythm:Regular Rate:Normal     Neuro/Psych negative neurological ROS  negative psych ROS   GI/Hepatic negative GI ROS, Neg liver ROS,   Endo/Other  negative endocrine ROS  Renal/GU Renal disease     Musculoskeletal negative musculoskeletal ROS (+)   Abdominal   Peds  Hematology negative hematology ROS (+)   Anesthesia Other Findings   Reproductive/Obstetrics negative OB ROS                            Anesthesia Physical Anesthesia Plan  ASA: III  Anesthesia Plan: General   Post-op Pain Management:    Induction: Intravenous  PONV Risk Score and Plan: 4 or greater and Ondansetron, Dexamethasone and Treatment may vary due to age or medical condition  Airway Management Planned: LMA  Additional Equipment: None  Intra-op Plan:   Post-operative Plan: Extubation in OR  Informed Consent: I have reviewed the patients History and Physical, chart, labs and discussed the procedure including the risks, benefits and alternatives for the proposed anesthesia with the patient or authorized representative who has indicated his/her understanding and acceptance.     Dental advisory given  Plan Discussed with: CRNA  Anesthesia Plan Comments:         Anesthesia Quick Evaluation

## 2019-08-10 NOTE — Plan of Care (Signed)
  Problem: Coping: Goal: Level of anxiety will decrease Outcome: Progressing   Problem: Pain Managment: Goal: General experience of comfort will improve Outcome: Progressing   Problem: Safety: Goal: Ability to remain free from injury will improve Outcome: Progressing   

## 2019-08-10 NOTE — Plan of Care (Signed)

## 2019-08-10 NOTE — Progress Notes (Signed)
Patient ID: Anne [name], female   DOB: 20-Jun-1955, 64 y.o.   MRN: [id number]  Pre Procedure note for inpatients:   IVER FEHRENBACH has been scheduled for Procedure(s): THIGH MUSCLE BIOPSY (Left) today. The various methods of treatment have been discussed with the patient. After consideration of the risks, benefits and treatment options the patient has consented to the planned procedure.   The patient has been seen and labs reviewed. There are no changes in the patient's condition to prevent proceeding with the planned procedure today.  Recent labs:  Lab Results  Component Value Date   WBC 12.0 (H) 08/10/2019   HGB 11.3 (L) 08/10/2019   HCT 32.9 (L) 08/10/2019   PLT 309 08/10/2019   GLUCOSE 112 (H) 08/10/2019   CHOL 132 10/27/2017   TRIG 96 10/27/2017   HDL 56 10/27/2017   LDLCALC 57 10/27/2017   ALT 301 (H) 08/09/2019   AST 327 (H) 08/09/2019   NA 140 08/10/2019   K 3.7 08/10/2019   CL 116 (H) 08/10/2019   CREATININE 0.91 08/10/2019   BUN 10 08/10/2019   CO2 20 (L) 08/10/2019   TSH 1.062 08/04/2019   INR 1.1 08/10/2019   HGBA1C 5.6 10/27/2017    Coralie Keens, MD 08/10/2019 1:12 PM

## 2019-08-10 NOTE — Op Note (Signed)
THIGH MUSCLE BIOPSY  Procedure Note  Anne [name] 08/10/2019   Pre-op Diagnosis: WEAKNESS, RULE OUT MYOSITIS     Post-op Diagnosis: same  Procedure(s): LEFT THIGH MUSCLE BIOPSY  Surgeon(s): Coralie Keens, MD  Anesthesia: General  Staff:  Circulator: Philomena Doheny, RN Scrub Person: Missy Sabins, Caryl Ada T  Estimated Blood Loss: Minimal               Specimens: SENT TO [facility]  Indications: This is a 64 year old female who presents with profound weakness.  A left thigh muscle biopsy has been requested.  Procedure: Patient brought to operating room and identified as the correct patient.  She was placed supine on the operating table general anesthesia was induced.  Her left lateral thigh was then prepped and draped in usual sterile fashion.  I anesthetized skin with Marcaine and then made a longitudinal incision with a scalpel.  I took this down to the fascia which I then opened.  I identified the quadriceps muscle.  I clamped a section of muscle proximal and distal hemostats and excised dissection with a scalpel.  This was sent to pathology for evaluation.  I then achieved hemostasis with cautery.  I then anesthetized the muscle and fascia with more Marcaine.  Hemostasis appeared to be achieved again.  I closed the fascia loosely with several interrupted 3-0 Vicryl sutures.  I then closed the subcutaneous tissue with interrupted 3-0 Vicryl sutures and closed skin with running 4-0 Monocryl.  Dermabond was then applied.  Patient tolerated the procedure well.  All the counts were correct at the end of the procedure.  The patient was then extubated in the operating room and taken in a stable condition to the recovery room.          Coralie Keens   Date: 08/10/2019  Time: 2:09 PM

## 2019-08-11 LAB — CK: Total CK: 3837 U/L — ABNORMAL HIGH (ref 38–234)

## 2019-08-11 LAB — BASIC METABOLIC PANEL
Anion gap: 12 (ref 5–15)
BUN: 8 mg/dL (ref 8–23)
CO2: 18 mmol/L — ABNORMAL LOW (ref 22–32)
Calcium: 8.5 mg/dL — ABNORMAL LOW (ref 8.9–10.3)
Chloride: 110 mmol/L (ref 98–111)
Creatinine, Ser: 0.94 mg/dL (ref 0.44–1.00)
GFR calc Af Amer: >60 mL/min (ref >60)
GFR calc non Af Amer: >60 mL/min (ref >60)
Glucose, Bld: 92 mg/dL (ref 70–99)
Potassium: 4.2 mmol/L (ref 3.5–5.1)
Sodium: 140 mmol/L (ref 135–145)

## 2019-08-11 LAB — COMPREHENSIVE METABOLIC PANEL
ALT: 175 U/L — ABNORMAL HIGH (ref 0–44)
AST: 143 U/L — ABNORMAL HIGH (ref 15–41)
Albumin: 2.2 g/dL — ABNORMAL LOW (ref 3.5–5.0)
Alkaline Phosphatase: 103 U/L (ref 38–126)
Anion gap: 7 (ref 5–15)
BUN: 9 mg/dL (ref 8–23)
CO2: 21 mmol/L — ABNORMAL LOW (ref 22–32)
Calcium: 8.2 mg/dL — ABNORMAL LOW (ref 8.9–10.3)
Chloride: 110 mmol/L (ref 98–111)
Creatinine, Ser: 0.93 mg/dL (ref 0.44–1.00)
GFR calc Af Amer: >60 mL/min (ref >60)
GFR calc non Af Amer: >60 mL/min (ref >60)
Glucose, Bld: 121 mg/dL — ABNORMAL HIGH (ref 70–99)
Potassium: 4.9 mmol/L (ref 3.5–5.1)
Sodium: 138 mmol/L (ref 135–145)
Total Bilirubin: 0.6 mg/dL (ref 0.3–1.2)
Total Protein: 4.9 g/dL — ABNORMAL LOW (ref 6.5–8.1)

## 2019-08-11 LAB — MAGNESIUM: Magnesium: 1.6 mg/dL — ABNORMAL LOW (ref 1.7–2.4)

## 2019-08-11 MED ORDER — HEPARIN SODIUM (PORCINE) 5000 UNIT/ML IJ SOLN
5000.0000 [IU] | Freq: Three times a day (TID) | INTRAMUSCULAR | Status: DC
  Administered 2019-08-11 – 2019-08-12 (×2): 5000 [IU] via SUBCUTANEOUS
  Filled 2019-08-11 (×3): qty 1

## 2019-08-11 MED ORDER — METOPROLOL SUCCINATE ER 25 MG PO TB24
12.5000 mg | ORAL_TABLET | Freq: Every day | ORAL | Status: DC
  Administered 2019-08-11: 12.5 mg via ORAL
  Filled 2019-08-11 (×2): qty 1

## 2019-08-11 MED ORDER — MAGNESIUM SULFATE 4 GM/100ML IV SOLN
4.0000 g | Freq: Once | INTRAVENOUS | Status: AC
  Administered 2019-08-11: 4 g via INTRAVENOUS
  Filled 2019-08-11: qty 100

## 2019-08-11 MED ORDER — TICAGRELOR 60 MG PO TABS
60.0000 mg | ORAL_TABLET | Freq: Two times a day (BID) | ORAL | Status: DC
  Administered 2019-08-11 – 2019-08-12 (×3): 60 mg via ORAL
  Filled 2019-08-11 (×4): qty 1

## 2019-08-11 NOTE — Anesthesia Postprocedure Evaluation (Signed)
Anesthesia Post Note  Patient: Anne [name]  Procedure(s) Performed: THIGH MUSCLE BIOPSY (Left )     Patient location during evaluation: PACU Anesthesia Type: General Level of consciousness: awake and alert Pain management: pain level controlled Vital Signs Assessment: post-procedure vital signs reviewed and stable Respiratory status: spontaneous breathing, nonlabored ventilation, respiratory function stable and patient connected to nasal cannula oxygen Cardiovascular status: blood pressure returned to baseline and stable Postop Assessment: no apparent nausea or vomiting Anesthetic complications: no    Last Vitals:  Vitals:   08/10/19 2124 08/11/19 0455  BP: (!) 91/53 92/60  Pulse: 73 78  Resp: 17 17  Temp: 36.8 C 36.7 C  SpO2: 98% 99%    Last Pain:  Vitals:   08/11/19 0730  TempSrc:   PainSc: 0-No pain                 Anne [name]

## 2019-08-11 NOTE — Progress Notes (Signed)
Physical Therapy Treatment Patient Details Name: Anne [name] MRN: [id number] DOB: 01-24-1956 Today's Date: 08/11/2019    History of Present Illness Ms. Nuon is a 64 year old woman with PMH of CAD, Crohn's Disease, chronic bulging disc lumbar spine who presents for decreased appetite and generalized weakness. MRI of the pelvis-diffuse symmetric edematous myositis of the pelvic girdle musculature general involving all the musculature except the gluteus maximus. Serpiginous subchondral sclerosis of the superior right humeral head, in keeping with AVN. Also found to have elevated CKs, very low K, prolonged Qtc. S/P L thigh muscle biopsy 4/29.     PT Comments    Patient progressing well towards PT goals. Reports improved strength especially through LLE. No steppage gait noted today. Performed 5xSTS in 29 seconds which is improved from prior session (46 seconds with norm being 11.4 seconds) but still indicative of decreased LE strength. Tolerated stair training with min guard assist for safety. Improved ambulation distance and less fatigue noted today. Instructed pt how to safely use rollator and unlock/lock brakes prior to sitting. Will follow.   Follow Up Recommendations  Home health PT;Supervision for mobility/OOB;Supervision - Intermittent     Equipment Recommendations  None recommended by PT(DME already delivered)    Recommendations for Other Services       Precautions / Restrictions Precautions Precautions: Fall Precaution Comments: new L sided weakness. Restrictions Weight Bearing Restrictions: No    Mobility  Bed Mobility               General bed mobility comments: OOB in recliner upon entry   Transfers Overall transfer level: Needs assistance Equipment used: None Transfers: Sit to/from Stand Sit to Stand: Supervision         General transfer comment: supervision for safety. Stood without RW in front of her. 5x STS from chair using arm  rests.  Ambulation/Gait Ambulation/Gait assistance: Min Gaffer (Feet): 350 Feet Assistive device: 4-wheeled walker Gait Pattern/deviations: Step-through pattern;Trunk flexed;Decreased stride length Gait velocity: decreased Gait velocity interpretation: 1.31 - 2.62 ft/sec, indicative of limited community ambulator General Gait Details: Slow, steady gait with rollator, no knee buckling noted. Instructed pt how to use rollator brakes. Walking some without DME with close Min guard. No steppage gait today.   Stairs Stairs: Yes Stairs assistance: Min guard Stair Management: Step to pattern;Two rails Number of Stairs: 3 General stair comments: Cues for technique/safety.   Wheelchair Mobility    Modified Rankin (Stroke Patients Only)       Balance Overall balance assessment: Needs assistance Sitting-balance support: Feet supported;No upper extremity supported Sitting balance-Leahy Scale: Good     Standing balance support: During functional activity Standing balance-Leahy Scale: Fair Standing balance comment: Attempting to walk in room without UE support, unsteady                            Cognition Arousal/Alertness: Awake/alert Behavior During Therapy: WFL for tasks assessed/performed Overall Cognitive Status: Within Functional Limits for tasks assessed                                 General Comments: some decreased safety awareness, noted transferring from [facility] upon entry without using RW and significant L LE weakness       Exercises      General Comments General comments (skin integrity, edema, etc.): 5xSTS in 29 seconds which is improved from prior session (  46 seconds with norm being 11.4 seconds) but still indicative of decreased LE strength.      Pertinent Vitals/Pain Pain Assessment: Faces Faces Pain Scale: Hurts a little bit Pain Location: LLE Pain Descriptors / Indicators: Aching;Numbness Pain  Intervention(s): Monitored during session;Repositioned    Home Living                      Prior Function            PT Goals (current goals can now be found in the care plan section) Acute Rehab PT Goals Patient Stated Goal: to get home  Progress towards PT goals: Progressing toward goals    Frequency    Min 3X/week      PT Plan Current plan remains appropriate    Co-evaluation              AM-PAC PT "6 Clicks" Mobility   Outcome Measure  Help needed turning from your back to your side while in a flat bed without using bedrails?: None Help needed moving from lying on your back to sitting on the side of a flat bed without using bedrails?: None Help needed moving to and from a bed to a chair (including a wheelchair)?: A Little Help needed standing up from a chair using your arms (e.g., wheelchair or bedside chair)?: A Little Help needed to walk in hospital room?: A Little Help needed climbing 3-5 steps with a railing? : A Little 6 Click Score: 20    End of Session Equipment Utilized During Treatment: Gait belt Activity Tolerance: Patient tolerated treatment well Patient left: in chair;with call bell/phone within reach Nurse Communication: Mobility status PT Visit Diagnosis: Unsteadiness on feet (R26.81);Other abnormalities of gait and mobility (R26.89);Muscle weakness (generalized) (M62.81)     Time: 6415-8309 PT Time Calculation (min) (ACUTE ONLY): 22 min  Charges:  $Gait Training: 8-22 mins                     Marisa Severin, PT, DPT Acute Rehabilitation Services Pager [phone] Office [place] 08/11/2019, 3:36 PM

## 2019-08-11 NOTE — Progress Notes (Signed)
Pt's b/p dropped to 94/50.  Pt denies any symptoms like dizziness, lightheaded, weakness. MD notified.  Will continue to monitor.

## 2019-08-11 NOTE — Progress Notes (Addendum)
Subjective:  Anne [name] is a 64 y.o. with PMH of CAD s/p PCI, Crohn's disease and spine disease with history of cervical fusion admitted for severe hypokalemia and asymmetric weakness on hospital day 6  Patient reports her strength is returning in her left leg and she feels okay this morning.  She continues to have the numbness in her right toe and recommend follow-up with her neurosurgeon.  Discussed findings so far made aware we are waiting on pathology results.  Questions and concerns addressed.  Objective:  Vital signs in last 24 hours: Vitals:   08/10/19 1756 08/10/19 2124 08/11/19 0455 08/11/19 0500  BP: 92/63 (!) 91/53 92/60   Pulse: 71 73 78   Resp: 18 17 17    Temp: 98.6 F (37 C) 98.3 F (36.8 C) 98 F (36.7 C)   TempSrc: Oral Oral Oral   SpO2: 96% 98% 99%   Weight:    67.4 kg  Height:        General: NAD, nl appearance Cardiovascular: Normal rate, regular rhythm.  No murmurs, rubs, or gallops Pulmonary : Effort normal, breath sounds normal. No wheezes, rales, or rhonchi Abdominal: soft, nontender,  bowel sounds present Skin: Left thigh incision intact, no drainage, no erythema, nontender to palpation Psychiatric/Behavioral:  normal mood, normal behavior Neuro: 4 out of 5 weakness in left hip flexion, right hip flexion 5/5   CBC Latest Ref Rng & Units 08/10/2019 08/09/2019 08/08/2019  WBC 4.0 - 10.5 K/uL 12.0(H) 11.7(H) 10.6(H)  Hemoglobin 12.0 - 15.0 g/dL 11.3(L) 12.4 12.1  Hematocrit 36.0 - 46.0 % 32.9(L) 35.9(L) 35.1(L)  Platelets 150 - 400 K/uL 309 294 298   BMP Latest Ref Rng & Units 08/11/2019 08/10/2019 08/09/2019  Glucose 70 - 99 mg/dL 121(H) 112(H) 100(H)  BUN 8 - 23 mg/dL 9 10 7(L)  Creatinine 0.44 - 1.00 mg/dL 0.93 0.91 0.85  Sodium 135 - 145 mmol/L 138 140 141  Potassium 3.5 - 5.1 mmol/L 4.9 3.7 4.1  Chloride 98 - 111 mmol/L 110 116(H) 116(H)  CO2 22 - 32 mmol/L 21(L) 20(L) 19(L)  Calcium 8.9 - 10.3 mg/dL 8.2(L) 8.3(L) 7.7(L)   Lab Results    Component Value Date   CKTOTAL 3,837 (H) 08/11/2019     Assessment/Plan:  Principal Problem:   Hypokalemia Active Problems:   AKI (acute kidney injury) ([place])   Generalized weakness   Prolonged Q-T interval on ECG   Malnutrition of moderate degree  [name] is a 64 year old female with coronary artery disease status post PCI, Crohn's disease off treatment, and spine disease with history of cervical fusion who presented with hypokalemia from GI losses 2/2 Crohn's disease.  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  Found to have tumor lysis CKD initially trending up to 17 point 1K and trended down with fluids, today 3.8K. Will stop IV fluids.  AST ALT also trending down.  Work-up consistent with distal RTA the etiology of hypokalemia, see other notes for details of work-up. Patient started on sodium bicarbonate on 4/29. General surgery did a biopsy of left thigh on 4/29 and pathology results are pending Patient is improving with current treatment and PT/OT.  Notable labs: Potassium nl this morning, magnesium 1.6. Chloride nl. NAGMA, with Bicarb trending from 21 down to 18. Plan: - replete Mg -Trend CK - Trend LFTs - stop IV fluids  - Sodium Bicarbonate 1,921m (40 meq) BID - Follow up aldosterone/Renin  Elevated ALT, AST Patient's LFT's are likely related to problem #1 above, however will check autoimmune hepatitis panel. LFT's trending down today. MRI abdomen showed right hepatic mass seen on ultrasound is benign hepatic hemangioma. Hep B negative, and Hep C negative. HIV negative.  Plan:  - Trend CMP - F/u  autoimmune hepatitis labs  Crohn's disease Thought hypokalemia likely to flair of Crohn's disease.  Patient has been asymptomatic on admission and CRP normal. She has not been on Azathioprine for 2 years, reports she stopped following with her GI doctor.  We will hold off on restarting her azathioprine  and recommend follow-up with her gastroenterologist.  Plan: - Continue to monitor symptoms   Avascular Necrosis of right hip Serpiginous subchondral sclerosis of the superior right humeral head noted on MR pelvis.  Patient is asymptomatic. Seen by ortho and they recommended follow up with Dr.Swinteck in the outpatient setting.   CAD Patient had Left heart catheterization done in July 2019 with stent placement in distal RCA. Came in on metoprolol succinate 25 mg, decreased due to hypotension, without symptoms.  Plan:  - hold Crestor 40 mg nightly - Aspirin 81 mg daily - Metoprolol 12.5 mg daily - Continue Brilinta 60 mg   DVT prophx: Regular Diet: regular diet, NPO at midnight Bowel: n/a Code: Full  Dispo: Anticipated discharge in approximately 1-2 day(s).   Anne Snider, MD PGY1

## 2019-08-11 NOTE — Final Consult Note (Signed)
Consultant Final Sign-Off Note    Assessment/Final recommendations  Anne KUNDA is a 64 y.o. female followed by me for weakness with a request for a muscle biopsy to rule out myositis.  Patient is status post left thigh muscle biopsy by Dr. Ninfa Linden on 4/29.  Specimen was sent to path.  Primary team to follow-up on pathology.  Currently the wound is clean, dry and intact.  Sutures used to subcutaneous tissue as well as the skin are dissolvable.  Dermabond intact.  I discussed wound care with the patient.  She can follow-up as needed.  We will sign off.  Thank you for the consult!  Please call back sooner with any questions or concerns.   Wound care (if applicable): Patient may shower   Diet at discharge: per primary team   Activity at discharge: per primary team   Follow-up appointment:  PRN   Pending results:  Unresulted Labs (From admission, onward)    Start     Ordered   08/11/19 1638  Basic metabolic panel  Once,   R    Question:  Specimen collection method  Answer:  Lab=Lab collect   08/11/19 0627   08/10/19 1121  Aldosterone + renin activity w/ ratio  Add-on,   AD    Question:  Specimen collection method  Answer:  Lab=Lab collect   08/10/19 1120   08/08/19 1331  AntiMicrosomal Ab-Liver / Kidney  Once,   R    Question:  Specimen collection method  Answer:  Lab=Lab collect   08/08/19 1335   08/08/19 1328  Mpo/pr-3 (anca) antibodies  Once,   R    Question:  Specimen collection method  Answer:  Lab=Lab collect   08/08/19 1335   08/08/19 1327  Anti-smooth muscle antibody, IgG  Once,   R    Question:  Specimen collection method  Answer:  Lab=Lab collect   08/08/19 1335   08/08/19 1327  Soluble Liver Ag (IgG Ab)  Once,   R    Question:  Specimen collection method  Answer:  Lab=Lab collect   08/08/19 1335           Medication recommendations:   Other recommendations:    Thank you for allowing [place] to participate in the care of your patient!  Please consult [place] again if you  have further needs for your patient.  Barth Kirks [facility] 08/11/2019 7:41 AM    Subjective   Doing well.  No pain over area of biopsy.  Objective  Vital signs in last 24 hours: Temp:  [97.8 F (36.6 C)-98.6 F (37 C)] 98 F (36.7 C) (04/30 0455) Pulse Rate:  [67-87] 78 (04/30 0455) Resp:  [12-21] 17 (04/30 0455) BP: (89-102)/(52-66) 92/60 (04/30 0455) SpO2:  [96 %-100 %] 99 % (04/30 0455) Weight:  [67.4 kg] 67.4 kg (04/30 0500)  General: Awake and alert, no acute distress Lungs: Normal rate and effort Msk: Left thigh wound clean, dry and intact with Dermabond overlying.  No surrounding skin changes or signs of infection.  No drainage.  Nontender to palpation.  Pertinent labs and Studies: Recent Labs    08/09/19 0125 08/10/19 0220  WBC 11.7* 12.0*  HGB 12.4 11.3*  HCT 35.9* 32.9*   BMET Recent Labs    08/10/19 0220 08/11/19 0209  NA 140 138  K 3.7 4.9  CL 116* 110  CO2 20* 21*  GLUCOSE 112* 121*  BUN 10 9  CREATININE 0.91 0.93  CALCIUM 8.3* 8.2*   No results for  input(s): LABURIN in the last 72 hours. Results for orders placed or performed during the hospital encounter of 08/04/19  SARS CORONAVIRUS 2 (TAT 6-24 HRS) Nasopharyngeal Nasopharyngeal Swab     Status: None   Collection Time: 08/04/19  4:41 PM   Specimen: Nasopharyngeal Swab  Result Value Ref Range Status   SARS Coronavirus 2 NEGATIVE NEGATIVE Final    Comment: (NOTE) SARS-CoV-2 target nucleic acids are NOT DETECTED. The SARS-CoV-2 RNA is generally detectable in upper and lower respiratory specimens during the acute phase of infection. Negative results do not preclude SARS-CoV-2 infection, do not rule out co-infections with other pathogens, and should not be used as the sole basis for treatment or other patient management decisions. Negative results must be combined with clinical observations, patient history, and epidemiological information. The expected result is Negative. Fact Sheet for  Patients: SugarRoll.be Fact Sheet for Healthcare Providers: https://www.woods-mathews.com/ This test is not yet approved or cleared by the [place] FDA and  has been authorized for detection and/or diagnosis of SARS-CoV-2 by FDA under an Emergency Use Authorization (EUA). This EUA will remain  in effect (meaning this test can be used) for the duration of the COVID-19 declaration under Section 56 4(b)(1) of the Act, 21 U.S.C. section 360bbb-3(b)(1), unless the authorization is terminated or revoked sooner. Performed at [facility], [place] [address], [place], [place] [address]   Surgical pcr screen     Status: Abnormal   Collection Time: 08/10/19  5:39 AM   Specimen: Nasal Mucosa; Nasal Swab  Result Value Ref Range Status   MRSA, PCR POSITIVE (A) NEGATIVE Final    Comment: RESULT CALLED TO, READ BACK BY AND VERIFIED WITH: Yevonne Aline RN 9:35 08/10/19 ([email or web address])    Staphylococcus aureus POSITIVE (A) NEGATIVE Final    Comment: (NOTE) The Xpert SA Assay (FDA approved for NASAL specimens in patients 35 years of age and older), is one component of a comprehensive surveillance program. It is not intended to diagnose infection nor to guide or monitor treatment. Performed at [facility], [place] [address] [place], [place] [address]     Imaging: No results found.

## 2019-08-11 NOTE — Evaluation (Signed)
Occupational Therapy Evaluation Patient Details Name: Anne [name] MRN: [id number] DOB: 06/09/1955 Today's Date: 08/11/2019    History of Present Illness Anne [name] is a 64 year old woman with PMH of CAD, Crohn's Disease, chronic bulging disc lumbar spine who presents for decreased appetite and generalized weakness. MRI of the pelvis-diffuse symmetric edematous myositis of the pelvic girdle musculature general involving all the musculature except the gluteus maximus. Serpiginous subchondral sclerosis of the superior right humeral head, in keeping with AVN. Also found to have elevated CKs, very low K, prolonged Qtc. S/P L thigh muscle biopsy 4/29.    Clinical Impression   Patient seated in recliner upon entry, reporting completing ADLs and mobility using RW in room without assist this morning to get ready for the day.  She is agreeable to working with OT.  Completing transfers with supervision for IV line mgmt (reports not being connected this am), safety awareness.  Grooming at sink with supervision, LB ADLs (educated on use of sock aide for L LE and hemi dressing techniques) with supervision. When therapist stepped out to get sock aide, therapist returned to find patient transferring off BSC without use of RW.  Educated on safety with all mobility, especially with L LE weakness and impaired balance.  Requires min assist for simulated tub transfers, educated on technique with RW but continues to require assist to ascend L LE over threshold.  She reports her spouse is present and will assist as needed at dc.  Based on performance today, no further acute OT needs (pt verbalizes understanding with education) and follow up at [facility] level recommended.  OT to sign off.    Follow Up Recommendations  Home health OT;Supervision - Intermittent    Equipment Recommendations  3 in 1 bedside commode    Recommendations for Other Services       Precautions / Restrictions Precautions Precautions:  Fall Precaution Comments: new L sided weakness. Restrictions Weight Bearing Restrictions: No      Mobility Bed Mobility               General bed mobility comments: OOB in recliner upon entry   Transfers Overall transfer level: Needs assistance Equipment used: Rolling walker (2 wheeled) Transfers: Sit to/from Stand Sit to Stand: Supervision         General transfer comment: supervision for safety    Balance Overall balance assessment: Needs assistance Sitting-balance support: Feet supported;No upper extremity supported Sitting balance-Leahy Scale: Good     Standing balance support: Bilateral upper extremity supported;During functional activity;No upper extremity supported Standing balance-Leahy Scale: Fair Standing balance comment: B UE support dynamically                           ADL either performed or assessed with clinical judgement   ADL Overall ADL's : Needs assistance/impaired;Modified independent     Grooming: Supervision/safety;Standing               Lower Body Dressing: Supervision/safety;Sit to/from stand;With adaptive equipment Lower Body Dressing Details (indicate cue type and reason): educated on use of sock aide for L sock, able to figure 4 R foot for sock; educated on hemi dressing techniques and safety  Toilet Transfer: Modified Independent;Stand-pivot;BSC Toilet Transfer Details (indicate cue type and reason): pt transferring off BSC upon entry without assist  Toileting- Clothing Manipulation and Hygiene: Modified independent;Sit to/from stand   Tub/ Shower Transfer: Tub transfer;Minimal assistance;Ambulation;3 in 1;Rolling walker Tub/Shower Transfer Details (indicate cue  type and reason): educated on use of 3:1 in shower for chair, demonstrates ability to complete tub transfers (simulated) with min assist for L LE mgmt over threshold. Pt reports spouse plans to assist.  Functional mobility during ADLs:  Supervision/safety;Rolling walker       Vision         Perception     Praxis      Pertinent Vitals/Pain Pain Assessment: Faces Faces Pain Scale: Hurts a little bit Pain Location: LLE Pain Descriptors / Indicators: Aching;Numbness Pain Intervention(s): Monitored during session;Repositioned     Hand Dominance     Extremity/Trunk Assessment             Communication     Cognition Arousal/Alertness: Awake/alert Behavior During Therapy: WFL for tasks assessed/performed;Flat affect Overall Cognitive Status: Within Functional Limits for tasks assessed                                 General Comments: some decreased safety awareness, noted transferring from [facility] upon entry without using RW and significant L LE weakness    General Comments       Exercises     Shoulder Instructions      Home Living                                          Prior Functioning/Environment                   OT Problem List:        OT Treatment/Interventions:      OT Goals(Current goals can be found in the care plan section) Acute Rehab OT Goals Patient Stated Goal: to get home  OT Goal Formulation: With patient  OT Frequency: Min 2X/week   Barriers to D/C:            Co-evaluation              AM-PAC OT "6 Clicks" Daily Activity     Outcome Measure Help from another person eating meals?: None Help from another person taking care of personal grooming?: None Help from another person toileting, which includes using toliet, bedpan, or urinal?: None Help from another person bathing (including washing, rinsing, drying)?: A Little Help from another person to put on and taking off regular upper body clothing?: None Help from another person to put on and taking off regular lower body clothing?: A Little 6 Click Score: 22   End of Session Equipment Utilized During Treatment: Rolling walker Nurse Communication: Mobility status  Activity  Tolerance: Patient tolerated treatment well Patient left: in chair;with call bell/phone within reach;with nursing/sitter in room  OT Visit Diagnosis: Unsteadiness on feet (R26.81);Muscle weakness (generalized) (M62.81)                Time: 2233-6122 OT Time Calculation (min): 19 min Charges:  OT General Charges $OT Visit: 1 Visit OT Treatments $Self Care/Home Management : 8-22 mins  [place] Pager [phone] Office [phone]    Delight Stare 08/11/2019, 1:29 PM

## 2019-08-12 DIAGNOSIS — E878 Other disorders of electrolyte and fluid balance, not elsewhere classified: Secondary | ICD-10-CM

## 2019-08-12 DIAGNOSIS — M609 Myositis, unspecified: Secondary | ICD-10-CM

## 2019-08-12 DIAGNOSIS — M87851 Other osteonecrosis, right femur: Secondary | ICD-10-CM

## 2019-08-12 LAB — COMPREHENSIVE METABOLIC PANEL
ALT: 139 U/L — ABNORMAL HIGH (ref 0–44)
AST: 85 U/L — ABNORMAL HIGH (ref 15–41)
Albumin: 2.4 g/dL — ABNORMAL LOW (ref 3.5–5.0)
Alkaline Phosphatase: 100 U/L (ref 38–126)
Anion gap: 8 (ref 5–15)
BUN: 8 mg/dL (ref 8–23)
CO2: 22 mmol/L (ref 22–32)
Calcium: 8.3 mg/dL — ABNORMAL LOW (ref 8.9–10.3)
Chloride: 111 mmol/L (ref 98–111)
Creatinine, Ser: 0.95 mg/dL (ref 0.44–1.00)
GFR calc Af Amer: >60 mL/min (ref >60)
GFR calc non Af Amer: >60 mL/min (ref >60)
Glucose, Bld: 90 mg/dL (ref 70–99)
Potassium: 3.5 mmol/L (ref 3.5–5.1)
Sodium: 141 mmol/L (ref 135–145)
Total Bilirubin: 0.8 mg/dL (ref 0.3–1.2)
Total Protein: 5.5 g/dL — ABNORMAL LOW (ref 6.5–8.1)

## 2019-08-12 LAB — CBC
HCT: 33.2 % — ABNORMAL LOW (ref 36.0–46.0)
Hemoglobin: 11.4 g/dL — ABNORMAL LOW (ref 12.0–15.0)
MCH: 31.1 pg (ref 26.0–34.0)
MCHC: 34.3 g/dL (ref 30.0–36.0)
MCV: 90.5 fL (ref 80.0–100.0)
Platelets: 340 10*3/uL (ref 150–400)
RBC: 3.67 MIL/uL — ABNORMAL LOW (ref 3.87–5.11)
RDW: 17.1 % — ABNORMAL HIGH (ref 11.5–15.5)
WBC: 11.6 10*3/uL — ABNORMAL HIGH (ref 4.0–10.5)
nRBC: 0 % (ref 0.0–0.2)

## 2019-08-12 LAB — CK: Total CK: 2195 U/L — ABNORMAL HIGH (ref 38–234)

## 2019-08-12 LAB — MAGNESIUM: Magnesium: 2 mg/dL (ref 1.7–2.4)

## 2019-08-12 MED ORDER — METOPROLOL SUCCINATE ER 25 MG PO TB24: 12.5000 mg | ORAL_TABLET | Freq: Every day | ORAL | 0 refills | Status: DC

## 2019-08-12 MED ORDER — SODIUM BICARBONATE 650 MG PO TABS: 1950.0000 mg | ORAL_TABLET | Freq: Two times a day (BID) | ORAL | 0 refills | Status: DC

## 2019-08-12 NOTE — Plan of Care (Signed)

## 2019-08-12 NOTE — Progress Notes (Signed)
Discharged home today,husband driving her home. Personal belongings,discharged instructions given. Advised to pick up medications called in to pharmacy of choice.Verbalized understanding of instructions

## 2019-08-12 NOTE — Progress Notes (Signed)
  Date: 08/12/2019  Patient name: Anne [name]  Medical record number: [id number]  Date of birth: 1955/07/27        I have seen and evaluated this patient and I have discussed the plan of care with the house staff. Please see their note for complete details. I concur with their findings with the following additions/corrections: Likely D/C today.  Bartholomew Crews, MD 08/12/2019, 1:31 PM

## 2019-08-12 NOTE — Discharge Instructions (Signed)
Anne [name],   It has been a pleasure working with you and we are glad you're feeling better. You were hospitalized for low potassium and muscle weakness. You had a biopsy of your muscles to further evaluate this. You were treated with IV fluids and sodium bicarb for your potassium. Please start taking the sodium bicarb as instructed and follow up with your primary care provider in 1 week to recheck your labs.  You were also found to have low blood pressures while you were here, please decrease your metoprolol to 12.5 mg daily for now and follow up with your primary care provider to see if this needs to be adjusted.    Follow up with your primary care provider in 1-2 weeks If your symptoms worsen or you develop new symptoms, please seek medical help whether it is your primary care provider or emergency department.   Tissue Adhesive Wound Care Some cuts, wounds, lacerations, and incisions can be repaired by using tissue adhesive, also called skin glue. It holds the skin together so healing can happen faster. It forms a strong bond on the skin in about 1 minute, and it reaches its full strength in about 2-3 minutes. The adhesive disappears naturally while the wound is healing. It is important to take proper care of your wound at home while it heals. Follow these instructions at home: Wound care   Showers are allowed after the first 24 hours. Do not soak the area where the tissue adhesive was placed. Do not take baths, swim, or use hot tubs. Do not use any soaps, petroleum jelly products, or ointments on the wound. Certain ointments can weaken the glue.  If a bandage (dressing) has been applied, keep it dry.  Follow instructions from your health care provider about how often to change the dressing. ? Wash your hands with soap and water before you change your dressing. If soap and water are not available, use hand sanitizer. ? Change your dressing as told by your health care provider. ? Leave  tissue adhesive in place. It will fall off on its own after 7-10 days.  Do not scratch, rub, or pick at the adhesive.  Do not place tape over the adhesive. The adhesive could come off of the wound when you pull the tape off.  Protect the wound from further injury until it is healed.  Protect the wound from sun and tanning bed exposure while it is healing and for several weeks after healing. General instructions  Take over-the-counter and prescription medicines only as told by your health care provider.  Keep all follow-up visits as told by your health care provider. This is important. Get help right away if:  Your wound reopens and is draining.  Your wound becomes red, swollen, hot, or tender.  You develop a rash after the glue is applied.  You have increasing pain in the wound.  You have a red streak going away from the wound.  You have pus coming from the wound.  You have increased bleeding.  You have a fever.  You have shaking chills.  You notice a bad smell coming from the wound.  Your wound or the adhesive breaks open. This information is not intended to replace advice given to you by your health care provider. Make sure you discuss any questions you have with your health care provider. Document Revised: 03/12/2017 Document Reviewed: 02/21/2016 Elsevier Patient Education  2020 [employer].

## 2019-08-12 NOTE — Discharge Summary (Signed)
Name: Anne [name] MRN: [id number] DOB: 1955/10/14 64 y.o. PCP: Jodi Marble, MD  Date of Admission: 08/04/2019  8:44 AM Date of Discharge: 08/12/2019 Attending Physician: Larey Dresser A  Discharge Diagnosis: 1. Hypokalemia, hyperchloremic NAGMA 2/2 RTA 2. Avacular necrosis 3. Crohn's Disease 4. AKI  Discharge Medications: Allergies as of 08/12/2019   No Known Allergies     Medication List    STOP taking these medications   atorvastatin 80 MG tablet Commonly known as: LIPITOR   Azasan 75 MG Tabs Generic drug: Azathioprine   predniSONE 10 MG (21) Tbpk tablet Commonly known as: STERAPRED UNI-PAK 21 TAB     TAKE these medications   acetaminophen-codeine 300-60 MG tablet Commonly known as: TYLENOL #4 Take 1 tablet by mouth every 6 (six) hours as needed for moderate pain or severe pain. What changed:   how much to take  when to take this   alendronate 70 MG tablet Commonly known as: FOSAMAX Take 70 mg by mouth every Sunday. Take with a full glass of water on an empty stomach.   aspirin 81 MG EC tablet Take 1 tablet (81 mg total) by mouth daily.   Brilinta 60 MG Tabs tablet Generic drug: ticagrelor Take 60 mg by mouth 2 (two) times daily.   metoprolol succinate 25 MG 24 hr tablet Commonly known as: TOPROL-XL Take 0.5 tablets (12.5 mg total) by mouth daily. What changed: how much to take   multivitamin with minerals Tabs tablet Take 1 tablet by mouth daily. Centrum Silver   nitroGLYCERIN 0.4 MG SL tablet Commonly known as: NITROSTAT Place 1 tablet (0.4 mg total) under the tongue every 5 (five) minutes x 3 doses as needed for chest pain.   rosuvastatin 40 MG tablet Commonly known as: CRESTOR Take 40 mg by mouth daily.   sodium bicarbonate 650 MG tablet Take 3 tablets (1,950 mg total) by mouth 2 (two) times daily.   Vitamin D3 50 MCG (2000 UT) Tabs Take 1 tablet by mouth daily.       Disposition and follow-up:   Anne [name] was  discharged from [facility] in Good condition.  At the hospital follow up visit please address:  1.  K stabilized to 3.5 on day of discharge, please repeat.   Please make sure she has the following follow ups scheduled.  Orthopedic surgery- for avascular necrosis of right hip Dr.Jones, neurosurgeon for lumbar spine , chronic problem Her GI doctor for her Crohn's disease PCP for further titration of Sodium Bicarbonate if needed Goal 22-24  Decreased Metoprolol form 25 to 12.5 on this admission due to low BP.   2.  Labs / imaging needed at time of follow-up: BMP  3.  Pending labs/ test needing follow-up: Thigh biopsy, aldosterone/renin  Follow-up Appointments: Follow-up Information    Surgery, [place]. Call.   Specialty: General Surgery Why: As needed with any questions in regards to your left leg incision Contact information: [address] [place] [address] [phone]        Jodi Marble, MD. Schedule an appointment as soon as possible for a visit in 1 week(s).   Specialty: Internal Medicine Contact information: [place] [address] [place] Hospital Course by problem list: 1. Myositis Persistent Hypokalemia, Hyperchloremic  NAGMA Patient coming in with hypokalemia and a history of chronic diarrhea secondary to being off treatment for Crohn's disease.  However  patient has normal bowel movements on hospital stay and CRP within normal limits.  Initially had prerenal AKI which quickly improved with fluids.  Consistent NAGMA concerning for RTA with ruling out renal disease and diarrhea.  Patient's other presenting symptom was weakness, most notable in left proximal leg and hip. MRI 04/25 showed diffuse myositis of pelvic girdle muscles with symmetric involvement.  CK trended as follows: ( CK 10.4>13.5K>17.1K>16.1K>13.8. Workup for myositis has included ESR (minimally elevated) and CRP (normal). AST  and ALT consistent with muscle damage. Alk phos is at the high limit of normal and GGT elevated outside nl range as well. Gallbladder surgically absent and no common bile duct dilation. Avascular necrosis of right hip noted on MR Pelvis. Working differential includes dermatomyositis, polymyositis, necrotizing immune myositis, and inclusion body myositis. Patient has a heliotrope rash, consistent with dermatomyositis. No other rashes or skin changes.  Patient was on a statin and it has been held.  RTA with consistent hypokalemia leading to [employer] is leading differential after negative ANA. Will consider further workup with myositis panel if needed with results of muscle biopsy. Consulted general surgery on 4/26, asked to hold Brilinta, underwent muscle biopsy on 4/29. Resumed brilinta following procedure with no issues. Will need follow up of results.    Avascular Necrosis of right hip Serpiginous subchondral sclerosis of the superior right humeral head noted on MR pelvis.  Patient is asymptomatic. Seen by ortho and they recommended follow up with Dr.Swinteck in the outpatient setting.   Crohn's disease Thought hypokalemia likely to flair of Crohn's disease.  Patient has been asymptomatic on admission and CRP normal. She has not been on Azathioprine for 2 years, reports she stopped following with her GI doctor.  We will hold off on restarting her azathioprine and recommend follow-up with her gastroenterologist.  AKI Baseline Cr around 1.2, down with IV fluids.  Renal ultrasound without sign of obstruction.  Radiologist noted 2.3 x 2.5 x 2.1 cm hyperechoic right hepatic mass which was need to be followed up with a nonemergent MRI of the abdomen ( MRI Abdomen on 4/26 ).  Discharge Vitals:   BP 116/76 (BP Location: Right Arm)   Pulse 72   Temp 98.7 F (37.1 C) (Axillary)   Resp 18   Ht 5' 9"  (1.753 m)   Wt 67.8 kg   LMP 02/18/2001   SpO2 100%   BMI 22.07 kg/m   Pertinent Labs, Studies, and  Procedures:  CBC Latest Ref Rng & Units 08/12/2019 08/10/2019 08/09/2019  WBC 4.0 - 10.5 K/uL 11.6(H) 12.0(H) 11.7(H)  Hemoglobin 12.0 - 15.0 g/dL 11.4(L) 11.3(L) 12.4  Hematocrit 36.0 - 46.0 % 33.2(L) 32.9(L) 35.9(L)  Platelets 150 - 400 K/uL 340 309 294   BMP Latest Ref Rng & Units 08/12/2019 08/11/2019 08/11/2019  Glucose 70 - 99 mg/dL 90 92 121(H)  BUN 8 - 23 mg/dL 8 8 9   Creatinine 0.44 - 1.00 mg/dL 0.95 0.94 0.93  Sodium 135 - 145 mmol/L 141 140 138  Potassium 3.5 - 5.1 mmol/L 3.5 4.2 4.9  Chloride 98 - 111 mmol/L 111 110 110  CO2 22 - 32 mmol/L 22 18(L) 21(L)  Calcium 8.9 - 10.3 mg/dL 8.3(L) 8.5(L) 8.2(L)     Discharge Instructions: Discharge Instructions    Call MD for:  difficulty breathing, headache or visual disturbances   Complete by: As directed    Call MD for:  extreme fatigue   Complete by: As directed    Call MD for:  hives  Complete by: As directed    Call MD for:  persistant dizziness or light-headedness   Complete by: As directed    Call MD for:  persistant nausea and vomiting   Complete by: As directed    Call MD for:  redness, tenderness, or signs of infection (pain, swelling, redness, odor or green/yellow discharge around incision site)   Complete by: As directed    Call MD for:  severe uncontrolled pain   Complete by: As directed    Call MD for:  temperature >100.4   Complete by: As directed    Diet - low sodium heart healthy   Complete by: As directed    Increase activity slowly   Complete by: As directed       Signed: Asencion Noble, MD 08/13/2019, 10:51 AM

## 2019-08-12 NOTE — Progress Notes (Signed)
Subjective: Patient reports that she is feeling okay today, she still has some leg weakness and reports some pain in her left leg when she moves it. She denies any lightheadedness, dizziness, fatigue, weakness, or other symptoms.  Discussed that her labs appear stable today, we will ensure she has close follow-up with the clinic to repeat labs early next week.  Objective:  Vital signs in last 24 hours: Vitals:   08/11/19 1454 08/11/19 2057 08/12/19 0500 08/12/19 0511  BP: (!) 94/50 (!) 86/62  (!) 103/57  Pulse: 69 66  76  Resp: 18 16  18   Temp: 98.2 F (36.8 C) 99.1 F (37.3 C)  98.2 F (36.8 C)  TempSrc: Oral Oral  Oral  SpO2: 94% 100%  98%  Weight:   67.8 kg   Height:       General: Middle aged female, NAD, laying in bed Cardiac: RRR, no m/r/g Pulmonary: CTABL, no wheezing, rhonchi, or rales Extremities: Left thigh surgical site clean, no drainage or bleeding noted Neuro: 4/5 weakness in BL LE hip flexion  CBC Latest Ref Rng & Units 08/12/2019 08/10/2019 08/09/2019  WBC 4.0 - 10.5 K/uL 11.6(H) 12.0(H) 11.7(H)  Hemoglobin 12.0 - 15.0 g/dL 11.4(L) 11.3(L) 12.4  Hematocrit 36.0 - 46.0 % 33.2(L) 32.9(L) 35.9(L)  Platelets 150 - 400 K/uL 340 309 294   BMP Latest Ref Rng & Units 08/12/2019 08/11/2019 08/11/2019  Glucose 70 - 99 mg/dL 90 92 121(H)  BUN 8 - 23 mg/dL 8 8 9   Creatinine 0.44 - 1.00 mg/dL 0.95 0.94 0.93  Sodium 135 - 145 mmol/L 141 140 138  Potassium 3.5 - 5.1 mmol/L 3.5 4.2 4.9  Chloride 98 - 111 mmol/L 111 110 110  CO2 22 - 32 mmol/L 22 18(L) 21(L)  Calcium 8.9 - 10.3 mg/dL 8.3(L) 8.5(L) 8.2(L)   Assessment/Plan:  Principal Problem:   Hypokalemia Active Problems:   AKI (acute kidney injury) ([place])   Generalized weakness   Prolonged Q-T interval on ECG   Malnutrition of moderate degree  This is a 64 year old female with history of CAD status post PCI, Crohn's disease off treatment, and spinal disease status post cervical fusion presented with hypokalemia  secondary to GI losses and distal RTA.  Myositis Hypokalemia secondary to distal RTA versus GI loss Patient noted to be hypokalemic with weakness in left hip flexion, noted to have elevated CK to 17.1 thousand.  Has improved with IV fluids.  Fluids was stopped yesterday.  Work-up was consistent with distal RTA and patient was started on sodium bicarb with improvement in her potassium.  Left thigh biopsy performed 4/29, pathology results pending.  CK 2000, down from 3800 yesterday.  Potassium 3.5 this morning, decreased from 4.2 yesterday however in the normal range.  Chloride normal.  Bicarb 22.  Mag 2.0. Overall lab seems stabilized.  Plan for discharge today and follow-up outpatient.  -Continue sodium bicarb 1950 mg twice daily -Follow-up aldosterone/renin, and surgical pathology results outpatient -Repeat LFTs on follow-up -Plan for discharge today  Elevated AST/ALT: -Improving. MRI abdomen showed right hepatic mass seen on ultrasound is benign hepatic hemangioma. Hep B negative, and Hep C negative. HIV negative.  Autoimmune hepatitis labs pending.  Avascular necrosis of right hip: Noted on MRI pelvis, currently is asymptomatic.  Orthopedics evaluated and recommended outpatient follow-up with Dr. Lyla Glassing.  CAD: Currently asymptomatic.  Was on metoprolol succinate 25 mg at home, this was decreased secondary to hypotension during admission.  Continues to have mild hypotension overnight that is asymptomatic.  Will discharge on the lower dose of metoprolol.  Prior to Admission Living Arrangement: Home Anticipated Discharge Location: Home Barriers to Discharge: None Dispo: Anticipated discharge is approximately today.   Asencion Noble, MD 08/12/2019, 6:20 AM Pager: [phone]

## 2019-08-12 NOTE — Progress Notes (Signed)
Paged on call MD about pt. B/P of 86/62 with pt. being asymptomatic. No new orders received. Will continue to monitor.

## 2019-08-15 DIAGNOSIS — K5090 Crohn's disease, unspecified, without complications: Secondary | ICD-10-CM | POA: Diagnosis not present

## 2019-08-15 DIAGNOSIS — M87051 Idiopathic aseptic necrosis of right femur: Secondary | ICD-10-CM | POA: Diagnosis not present

## 2019-08-15 DIAGNOSIS — Z95828 Presence of other vascular implants and grafts: Secondary | ICD-10-CM | POA: Diagnosis not present

## 2019-08-15 DIAGNOSIS — I252 Old myocardial infarction: Secondary | ICD-10-CM | POA: Diagnosis not present

## 2019-08-15 DIAGNOSIS — I2510 Atherosclerotic heart disease of native coronary artery without angina pectoris: Secondary | ICD-10-CM | POA: Diagnosis not present

## 2019-08-15 DIAGNOSIS — E876 Hypokalemia: Secondary | ICD-10-CM | POA: Diagnosis not present

## 2019-08-15 DIAGNOSIS — M609 Myositis, unspecified: Secondary | ICD-10-CM | POA: Diagnosis not present

## 2019-08-15 DIAGNOSIS — Z9181 History of falling: Secondary | ICD-10-CM | POA: Diagnosis not present

## 2019-08-15 DIAGNOSIS — M5116 Intervertebral disc disorders with radiculopathy, lumbar region: Secondary | ICD-10-CM | POA: Diagnosis not present

## 2019-08-15 DIAGNOSIS — Z87891 Personal history of nicotine dependence: Secondary | ICD-10-CM | POA: Diagnosis not present

## 2019-08-15 DIAGNOSIS — E878 Other disorders of electrolyte and fluid balance, not elsewhere classified: Secondary | ICD-10-CM | POA: Diagnosis not present

## 2019-08-15 DIAGNOSIS — N179 Acute kidney failure, unspecified: Secondary | ICD-10-CM | POA: Diagnosis not present

## 2019-08-15 DIAGNOSIS — R42 Dizziness and giddiness: Secondary | ICD-10-CM | POA: Diagnosis not present

## 2019-08-15 DIAGNOSIS — D1809 Hemangioma of other sites: Secondary | ICD-10-CM | POA: Diagnosis not present

## 2019-08-15 DIAGNOSIS — Z9049 Acquired absence of other specified parts of digestive tract: Secondary | ICD-10-CM | POA: Diagnosis not present

## 2019-08-16 DIAGNOSIS — I951 Orthostatic hypotension: Secondary | ICD-10-CM | POA: Diagnosis not present

## 2019-08-16 DIAGNOSIS — N390 Urinary tract infection, site not specified: Secondary | ICD-10-CM | POA: Diagnosis not present

## 2019-08-16 DIAGNOSIS — R197 Diarrhea, unspecified: Secondary | ICD-10-CM | POA: Diagnosis not present

## 2019-08-16 DIAGNOSIS — I252 Old myocardial infarction: Secondary | ICD-10-CM | POA: Diagnosis not present

## 2019-08-16 DIAGNOSIS — M5116 Intervertebral disc disorders with radiculopathy, lumbar region: Secondary | ICD-10-CM | POA: Diagnosis not present

## 2019-08-16 DIAGNOSIS — N189 Chronic kidney disease, unspecified: Secondary | ICD-10-CM | POA: Diagnosis not present

## 2019-08-16 DIAGNOSIS — I2510 Atherosclerotic heart disease of native coronary artery without angina pectoris: Secondary | ICD-10-CM | POA: Diagnosis not present

## 2019-08-16 DIAGNOSIS — M5127 Other intervertebral disc displacement, lumbosacral region: Secondary | ICD-10-CM | POA: Diagnosis not present

## 2019-08-16 DIAGNOSIS — K5090 Crohn's disease, unspecified, without complications: Secondary | ICD-10-CM | POA: Diagnosis not present

## 2019-08-16 DIAGNOSIS — M609 Myositis, unspecified: Secondary | ICD-10-CM | POA: Diagnosis not present

## 2019-08-16 DIAGNOSIS — M87051 Idiopathic aseptic necrosis of right femur: Secondary | ICD-10-CM | POA: Diagnosis not present

## 2019-08-16 DIAGNOSIS — E876 Hypokalemia: Secondary | ICD-10-CM | POA: Diagnosis not present

## 2019-08-16 DIAGNOSIS — M5416 Radiculopathy, lumbar region: Secondary | ICD-10-CM | POA: Diagnosis not present

## 2019-08-16 DIAGNOSIS — I739 Peripheral vascular disease, unspecified: Secondary | ICD-10-CM | POA: Diagnosis not present

## 2019-08-17 ENCOUNTER — Encounter: Payer: Self-pay | Admitting: Internal Medicine

## 2019-08-17 LAB — ALDOSTERONE + RENIN ACTIVITY W/ RATIO
ALDO / PRA Ratio: <2.1 (ref 0.0–30.0)
Aldosterone: <1 ng/dL (ref 0.0–30.0)
PRA LC/MS/MS: 0.473 ng/mL/hr (ref 0.167–5.380)

## 2019-08-21 DIAGNOSIS — K5090 Crohn's disease, unspecified, without complications: Secondary | ICD-10-CM | POA: Diagnosis not present

## 2019-08-21 DIAGNOSIS — R7401 Elevation of levels of liver transaminase levels: Secondary | ICD-10-CM | POA: Diagnosis not present

## 2019-08-21 DIAGNOSIS — M609 Myositis, unspecified: Secondary | ICD-10-CM | POA: Diagnosis not present

## 2019-08-21 DIAGNOSIS — I2510 Atherosclerotic heart disease of native coronary artery without angina pectoris: Secondary | ICD-10-CM | POA: Diagnosis not present

## 2019-08-21 DIAGNOSIS — R197 Diarrhea, unspecified: Secondary | ICD-10-CM | POA: Diagnosis not present

## 2019-08-22 DIAGNOSIS — I2510 Atherosclerotic heart disease of native coronary artery without angina pectoris: Secondary | ICD-10-CM | POA: Diagnosis not present

## 2019-08-22 DIAGNOSIS — M609 Myositis, unspecified: Secondary | ICD-10-CM | POA: Diagnosis not present

## 2019-08-22 DIAGNOSIS — M87051 Idiopathic aseptic necrosis of right femur: Secondary | ICD-10-CM | POA: Diagnosis not present

## 2019-08-22 DIAGNOSIS — M5116 Intervertebral disc disorders with radiculopathy, lumbar region: Secondary | ICD-10-CM | POA: Diagnosis not present

## 2019-08-22 DIAGNOSIS — I252 Old myocardial infarction: Secondary | ICD-10-CM | POA: Diagnosis not present

## 2019-08-22 DIAGNOSIS — K5090 Crohn's disease, unspecified, without complications: Secondary | ICD-10-CM | POA: Diagnosis not present

## 2019-08-24 LAB — SURGICAL PATHOLOGY

## 2019-08-25 DIAGNOSIS — E876 Hypokalemia: Secondary | ICD-10-CM | POA: Diagnosis not present

## 2019-08-30 DIAGNOSIS — K5090 Crohn's disease, unspecified, without complications: Secondary | ICD-10-CM | POA: Diagnosis not present

## 2019-08-30 DIAGNOSIS — M5116 Intervertebral disc disorders with radiculopathy, lumbar region: Secondary | ICD-10-CM | POA: Diagnosis not present

## 2019-08-30 DIAGNOSIS — M609 Myositis, unspecified: Secondary | ICD-10-CM | POA: Diagnosis not present

## 2019-08-30 DIAGNOSIS — I2510 Atherosclerotic heart disease of native coronary artery without angina pectoris: Secondary | ICD-10-CM | POA: Diagnosis not present

## 2019-08-30 DIAGNOSIS — I252 Old myocardial infarction: Secondary | ICD-10-CM | POA: Diagnosis not present

## 2019-08-30 DIAGNOSIS — M87051 Idiopathic aseptic necrosis of right femur: Secondary | ICD-10-CM | POA: Diagnosis not present

## 2019-09-04 ENCOUNTER — Other Ambulatory Visit: Payer: Self-pay | Admitting: Gastroenterology

## 2019-09-04 ENCOUNTER — Encounter (HOSPITAL_COMMUNITY): Payer: Self-pay

## 2019-09-04 DIAGNOSIS — I252 Old myocardial infarction: Secondary | ICD-10-CM | POA: Diagnosis not present

## 2019-09-04 DIAGNOSIS — M609 Myositis, unspecified: Secondary | ICD-10-CM | POA: Diagnosis not present

## 2019-09-04 DIAGNOSIS — I2510 Atherosclerotic heart disease of native coronary artery without angina pectoris: Secondary | ICD-10-CM | POA: Diagnosis not present

## 2019-09-04 DIAGNOSIS — K5090 Crohn's disease, unspecified, without complications: Secondary | ICD-10-CM | POA: Diagnosis not present

## 2019-09-04 DIAGNOSIS — M5116 Intervertebral disc disorders with radiculopathy, lumbar region: Secondary | ICD-10-CM | POA: Diagnosis not present

## 2019-09-04 DIAGNOSIS — R197 Diarrhea, unspecified: Secondary | ICD-10-CM | POA: Diagnosis not present

## 2019-09-04 DIAGNOSIS — M87051 Idiopathic aseptic necrosis of right femur: Secondary | ICD-10-CM | POA: Diagnosis not present

## 2019-09-05 ENCOUNTER — Encounter: Payer: Self-pay | Admitting: Internal Medicine

## 2019-09-08 DIAGNOSIS — F17210 Nicotine dependence, cigarettes, uncomplicated: Secondary | ICD-10-CM | POA: Diagnosis not present

## 2019-09-08 DIAGNOSIS — K5090 Crohn's disease, unspecified, without complications: Secondary | ICD-10-CM | POA: Diagnosis not present

## 2019-09-08 DIAGNOSIS — Z9861 Coronary angioplasty status: Secondary | ICD-10-CM | POA: Diagnosis not present

## 2019-09-08 DIAGNOSIS — E782 Mixed hyperlipidemia: Secondary | ICD-10-CM | POA: Diagnosis not present

## 2019-09-08 DIAGNOSIS — I10 Essential (primary) hypertension: Secondary | ICD-10-CM | POA: Diagnosis not present

## 2019-09-08 DIAGNOSIS — E876 Hypokalemia: Secondary | ICD-10-CM | POA: Diagnosis not present

## 2019-09-08 DIAGNOSIS — I2510 Atherosclerotic heart disease of native coronary artery without angina pectoris: Secondary | ICD-10-CM | POA: Diagnosis not present

## 2019-09-08 DIAGNOSIS — F17200 Nicotine dependence, unspecified, uncomplicated: Secondary | ICD-10-CM | POA: Diagnosis not present

## 2019-09-11 ENCOUNTER — Encounter (HOSPITAL_COMMUNITY): Payer: Self-pay

## 2019-09-12 ENCOUNTER — Other Ambulatory Visit (HOSPITAL_COMMUNITY)
Admission: RE | Admit: 2019-09-12 | Discharge: 2019-09-12 | Disposition: A | Discharge status: Home / Self Care | Payer: Medicare Other | Source: Ambulatory Visit | Attending: Gastroenterology | Admitting: Gastroenterology

## 2019-09-12 DIAGNOSIS — Z20822 Contact with and (suspected) exposure to covid-19: Secondary | ICD-10-CM | POA: Diagnosis not present

## 2019-09-12 DIAGNOSIS — Z01812 Encounter for preprocedural laboratory examination: Secondary | ICD-10-CM | POA: Insufficient documentation

## 2019-09-12 LAB — SARS CORONAVIRUS 2 (TAT 6-24 HRS): SARS Coronavirus 2: NEGATIVE

## 2019-09-12 NOTE — Progress Notes (Signed)
At tme of preprocedure phone call patient stated she had not received any preprocedure instructions for procedure on 09/15/19 in regards to [place].  Instructed pt to contact Dr Benson Norway today in regards to prepprocedure instructions.  Patient voiced understanding.

## 2019-09-14 ENCOUNTER — Encounter (HOSPITAL_COMMUNITY): Payer: Self-pay | Admitting: Certified Registered"

## 2019-09-15 ENCOUNTER — Ambulatory Visit (HOSPITAL_COMMUNITY): Admission: RE | Admit: 2019-09-15 | Payer: Medicare Other | Source: Home / Self Care | Admitting: Gastroenterology

## 2019-09-15 SURGERY — COLONOSCOPY WITH PROPOFOL
Anesthesia: Monitor Anesthesia Care

## 2019-09-19 ENCOUNTER — Other Ambulatory Visit (HOSPITAL_COMMUNITY)
Admission: RE | Admit: 2019-09-19 | Discharge: 2019-09-19 | Disposition: A | Discharge status: Home / Self Care | Payer: Medicare Other | Source: Ambulatory Visit | Attending: Gastroenterology | Admitting: Gastroenterology

## 2019-09-19 DIAGNOSIS — Z01812 Encounter for preprocedural laboratory examination: Secondary | ICD-10-CM | POA: Insufficient documentation

## 2019-09-19 DIAGNOSIS — Z20822 Contact with and (suspected) exposure to covid-19: Secondary | ICD-10-CM | POA: Diagnosis not present

## 2019-09-19 LAB — SARS CORONAVIRUS 2 (TAT 6-24 HRS): SARS Coronavirus 2: NEGATIVE

## 2019-09-22 ENCOUNTER — Encounter (HOSPITAL_COMMUNITY): Payer: Self-pay | Admitting: Gastroenterology

## 2019-09-22 ENCOUNTER — Other Ambulatory Visit: Payer: Self-pay

## 2019-09-22 ENCOUNTER — Ambulatory Visit (HOSPITAL_COMMUNITY): Payer: Medicare Other | Admitting: Anesthesiology

## 2019-09-22 ENCOUNTER — Encounter (HOSPITAL_COMMUNITY)
Admission: RE | Disposition: A | Discharge status: Home / Self Care | Payer: Self-pay | Source: Home / Self Care | Attending: Gastroenterology

## 2019-09-22 ENCOUNTER — Ambulatory Visit (HOSPITAL_COMMUNITY)
Admission: RE | Admit: 2019-09-22 | Discharge: 2019-09-22 | Disposition: A | Discharge status: Home / Self Care | Payer: Medicare Other | Attending: Gastroenterology | Admitting: Gastroenterology

## 2019-09-22 DIAGNOSIS — Q438 Other specified congenital malformations of intestine: Secondary | ICD-10-CM | POA: Insufficient documentation

## 2019-09-22 DIAGNOSIS — Z980 Intestinal bypass and anastomosis status: Secondary | ICD-10-CM | POA: Diagnosis not present

## 2019-09-22 DIAGNOSIS — Z1211 Encounter for screening for malignant neoplasm of colon: Secondary | ICD-10-CM | POA: Insufficient documentation

## 2019-09-22 DIAGNOSIS — R197 Diarrhea, unspecified: Secondary | ICD-10-CM | POA: Diagnosis not present

## 2019-09-22 DIAGNOSIS — I252 Old myocardial infarction: Secondary | ICD-10-CM | POA: Diagnosis not present

## 2019-09-22 DIAGNOSIS — K5010 Crohn's disease of large intestine without complications: Secondary | ICD-10-CM | POA: Insufficient documentation

## 2019-09-22 DIAGNOSIS — I2510 Atherosclerotic heart disease of native coronary artery without angina pectoris: Secondary | ICD-10-CM | POA: Insufficient documentation

## 2019-09-22 DIAGNOSIS — K633 Ulcer of intestine: Secondary | ICD-10-CM | POA: Diagnosis not present

## 2019-09-22 DIAGNOSIS — E876 Hypokalemia: Secondary | ICD-10-CM | POA: Diagnosis not present

## 2019-09-22 DIAGNOSIS — Z139 Encounter for screening, unspecified: Secondary | ICD-10-CM | POA: Diagnosis not present

## 2019-09-22 DIAGNOSIS — K529 Noninfective gastroenteritis and colitis, unspecified: Secondary | ICD-10-CM | POA: Insufficient documentation

## 2019-09-22 DIAGNOSIS — K5090 Crohn's disease, unspecified, without complications: Secondary | ICD-10-CM | POA: Diagnosis not present

## 2019-09-22 DIAGNOSIS — Z955 Presence of coronary angioplasty implant and graft: Secondary | ICD-10-CM | POA: Insufficient documentation

## 2019-09-22 HISTORY — PX: BIOPSY: SHX5522

## 2019-09-22 HISTORY — PX: COLONOSCOPY WITH PROPOFOL: SHX5780

## 2019-09-22 SURGERY — COLONOSCOPY WITH PROPOFOL
Anesthesia: Monitor Anesthesia Care

## 2019-09-22 MED ORDER — LACTATED RINGERS IV SOLN: INTRAVENOUS | Status: DC | PRN

## 2019-09-22 MED ORDER — SODIUM CHLORIDE 0.9 % IV SOLN: INTRAVENOUS | Status: DC

## 2019-09-22 MED ORDER — PHENYLEPHRINE HCL (PRESSORS) 10 MG/ML IV SOLN
INTRAVENOUS | Status: DC | PRN
Start: 2019-09-22 — End: 2019-09-22
  Administered 2019-09-22 (×2): 80 ug via INTRAVENOUS

## 2019-09-22 MED ORDER — EPHEDRINE SULFATE 50 MG/ML IJ SOLN
INTRAMUSCULAR | Status: DC | PRN
  Administered 2019-09-22 (×3): 10 mg via INTRAVENOUS

## 2019-09-22 MED ORDER — PROPOFOL 500 MG/50ML IV EMUL
INTRAVENOUS | Status: DC | PRN
  Administered 2019-09-22: 175 ug/kg/min via INTRAVENOUS

## 2019-09-22 SURGICAL SUPPLY — 21 items

## 2019-09-22 NOTE — H&P (Signed)
  Anne [name] HPI: The patient has a history of Crohn's disease and she recently started to have issues with diarrhea.  She has not followed for this issue in the office for several years and she is not on any medications.  The patient is here today for reassessment for the status of her Crohn's disease.  Past Medical History:  Diagnosis Date  . Coronary artery disease   . Crohn's colitis ([place])   . Hypokalemia 08/04/2019  . STEMI (ST elevation myocardial infarction) ([place])    10/27/17 PCI/DES x1 to the dRCA, with residual thrombus in the PLA, normal EF    Past Surgical History:  Procedure Laterality Date  . CORONARY/GRAFT ACUTE MI REVASCULARIZATION N/A 10/26/2017   Procedure: Coronary/Graft Acute MI Revascularization;  Surgeon: Troy Sine, MD;  Location: [facility];  Service: Cardiovascular;  Laterality: N/A;  . LEFT HEART CATH AND CORONARY ANGIOGRAPHY N/A 10/26/2017   Procedure: LEFT HEART CATH AND CORONARY ANGIOGRAPHY;  Surgeon: Troy Sine, MD;  Location: [facility];  Service: Cardiovascular;  Laterality: N/A;  . LEFT HEART CATH AND CORONARY ANGIOGRAPHY Left 05/05/2018   Procedure: Left heart cath and coronary angiography;  Surgeon: Dionisio David, MD;  Location: [facility];  Service: Cardiovascular;  Laterality: Left;  Marland Kitchen MUSCLE BIOPSY Left 08/10/2019   Procedure: THIGH MUSCLE BIOPSY;  Surgeon: Coralie Keens, MD;  Location: [facility];  Service: General;  Laterality: Left;  . SMALL INTESTINE SURGERY      No family history on file.  Social History:  reports that she does not drink alcohol and does not use drugs. No history on file for tobacco use.  Allergies: No Known Allergies  Medications: Scheduled: Continuous:  No results found for this or any previous visit (from the past 24 hour(s)).   No results found.  ROS:  As stated above in the HPI otherwise negative.  Last menstrual period 02/18/2001.    PE: Gen: NAD, Alert and Oriented HEENT:   [place]/AT, EOMI Neck: Supple, no LAD Lungs: CTA Bilaterally CV: RRR without M/G/R ABD: Soft, NTND, +BS Ext: No C/C/E  Assessment/Plan: 1) Crohn's disease. 2) Diarrhea.  Plan: 1) Colonoscopy with biopsies.  [name] D 09/22/2019, 9:07 AM

## 2019-09-22 NOTE — Anesthesia Postprocedure Evaluation (Signed)
Anesthesia Post Note  Patient: Anne [name]  Procedure(s) Performed: COLONOSCOPY WITH PROPOFOL (N/A ) BIOPSY     Patient location during evaluation: Endoscopy Anesthesia Type: MAC Level of consciousness: awake and alert Pain management: pain level controlled Vital Signs Assessment: post-procedure vital signs reviewed and stable Respiratory status: spontaneous breathing, nonlabored ventilation, respiratory function stable and patient connected to nasal cannula oxygen Cardiovascular status: blood pressure returned to baseline and stable Postop Assessment: no apparent nausea or vomiting Anesthetic complications: no   No complications documented.  Last Vitals:  Vitals:   09/22/19 1130 09/22/19 1139  BP: (!) 109/51 91/66  Pulse: (!) 59 62  Resp: 16 18  Temp:    SpO2: 100% 98%    Last Pain:  Vitals:   09/22/19 1139  TempSrc:   PainSc: 0-No pain                 Javione Gunawan L Steve Youngberg

## 2019-09-22 NOTE — Discharge Instructions (Signed)

## 2019-09-22 NOTE — Anesthesia Preprocedure Evaluation (Addendum)
Anesthesia Evaluation  Patient identified by MRN, date of birth, ID band Patient awake    Reviewed: Allergy & Precautions, NPO status , Patient's Chart, lab work & pertinent test results, reviewed documented beta blocker date and time   Airway Mallampati: II  TM Distance: >3 FB Neck ROM: Full    Dental  (+) Edentulous Upper   Pulmonary Current Smoker and Patient abstained from smoking., former smoker,    Pulmonary exam normal breath sounds clear to auscultation       Cardiovascular + CAD, + Past MI and + Cardiac Stents (DES to RCA 2019)  Normal cardiovascular exam Rhythm:Regular Rate:Normal  TTE 2019 - Left ventricle: The cavity size was normal. Systolic function wasnormal. The estimated ejection fraction was in the range of 50% to 55%. Wall motion was normal; there were no regional wall motion abnormalities. Left ventricular diastolic function parameters were normal.  - Aortic valve: Transvalvular velocity was within the normal range. There was no stenosis. There was no regurgitation.  - Mitral valve: Transvalvular velocity was within the normal range. There was no evidence for stenosis. There was mild regurgitation.  - Right ventricle: The cavity size was normal. Wall thickness was normal. Systolic function was normal.  - Tricuspid valve: There was no regurgitation.  LHC 2020 Dist RCA lesion is 30% stenosed. Essentially normal coronaries.    Neuro/Psych negative neurological ROS  negative psych ROS   GI/Hepatic negative GI ROS, Neg liver ROS, Crohn's   Endo/Other  negative endocrine ROS  Renal/GU negative Renal ROS  negative genitourinary   Musculoskeletal negative musculoskeletal ROS (+)   Abdominal   Peds  Hematology negative hematology ROS (+) On brilinta   Anesthesia Other Findings   Reproductive/Obstetrics                            Anesthesia Physical Anesthesia Plan  ASA:  III  Anesthesia Plan: MAC   Post-op Pain Management:    Induction: Intravenous  PONV Risk Score and Plan: 2 and Propofol infusion and Treatment may vary due to age or medical condition  Airway Management Planned: Natural Airway  Additional Equipment:   Intra-op Plan:   Post-operative Plan:   Informed Consent: I have reviewed the patients History and Physical, chart, labs and discussed the procedure including the risks, benefits and alternatives for the proposed anesthesia with the patient or authorized representative who has indicated his/her understanding and acceptance.     Dental advisory given  Plan Discussed with: CRNA  Anesthesia Plan Comments:         Anesthesia Quick Evaluation

## 2019-09-22 NOTE — Transfer of Care (Signed)
Immediate Anesthesia Transfer of Care Note  Patient: Anne [name]  Procedure(s) Performed: COLONOSCOPY WITH PROPOFOL (N/A ) BIOPSY  Patient Location: PACU  Anesthesia Type:General  Level of Consciousness: sedated, patient cooperative and responds to stimulation  Airway & Oxygen Therapy: Patient Spontanous Breathing and Patient connected to face mask oxygen  Post-op Assessment: Report given to RN and Post -op Vital signs reviewed and stable  Post vital signs: Reviewed and stable  Last Vitals:  Vitals Value Taken Time  BP    Temp    Pulse    Resp 16 09/22/19 1121  SpO2    Vitals shown include unvalidated device data.  Last Pain:  Vitals:   09/22/19 0909  TempSrc: Oral  PainSc: 0-No pain         Complications: No complications documented.

## 2019-09-22 NOTE — Op Note (Signed)
[facility] Patient Name: Anne [name] Procedure Date: 09/22/2019 MRN: [id number] Attending MD: Carol Ada , MD Date of Birth: 02-05-1956 CSN: [id number] Age: 64 Admit Type: Outpatient Procedure:                Colonoscopy Indications:              High risk colon cancer surveillance: Crohn's                            colitis of 8 (or more) years duration with                            one-third (or more) of the colon involved Providers:                Carol Ada, MD, Vista Lawman, RN, William Dalton, Technician Referring MD:              Medicines:                Propofol per Anesthesia Complications:            No immediate complications. Estimated Blood Loss:     Estimated blood loss: none. Procedure:                Pre-Anesthesia Assessment:                           - Prior to the procedure, a History and Physical                            was performed, and patient medications and                            allergies were reviewed. The patient's tolerance of                            previous anesthesia was also reviewed. The risks                            and benefits of the procedure and the sedation                            options and risks were discussed with the patient.                            All questions were answered, and informed consent                            was obtained. Prior Anticoagulants: The patient has                            taken Brilinta, last dose was 2 days prior to  procedure. ASA Grade Assessment: III - A patient                            with severe systemic disease. After reviewing the                            risks and benefits, the patient was deemed in                            satisfactory condition to undergo the procedure.                           - Sedation was administered by an anesthesia                            professional. Deep sedation  was attained.                           After obtaining informed consent, the colonoscope                            was passed under direct vision. Throughout the                            procedure, the patient's blood pressure, pulse, and                            oxygen saturations were monitored continuously. The                            CF-HQ190L ([id number] Olympus colonoscope was                            introduced through the anus and advanced to the the                            terminal ileum. The colonoscopy was somewhat                            difficult due to significant looping. Successful                            completion of the procedure was aided by using                            manual pressure and straightening and shortening                            the scope to obtain bowel loop reduction. The                            patient tolerated the procedure well. The quality  of the bowel preparation was good. The terminal                            ileum and the rectum were photographed. Scope In: 10:55:46 AM Scope Out: 11:18:15 AM Scope Withdrawal Time: 0 hours 14 minutes 53 seconds  Total Procedure Duration: 0 hours 22 minutes 29 seconds  Findings:      The terminal ileum contained multiple scattered non-bleeding erosions.       Biopsies were taken with a cold forceps for histology.      There was evidence of a prior functional end-to-end ileo-colonic       anastomosis in the ascending colon. This was patent and was       characterized by healthy appearing mucosa. The anastomosis was traversed.      Normal mucosa was found in the entire colon. Biopsies were taken with a       cold forceps for histology.      The prep was poor as there was significant amount of retained liquid       stool. Lavage and suctioning allowed for good views of the mucosa. Impression:               - Multiple erosions in the terminal ileum. Biopsied.                            - Patent functional end-to-end ileo-colonic                            anastomosis, characterized by healthy appearing                            mucosa.                           - Normal mucosa in the entire examined colon.                            Biopsied. Moderate Sedation:      Not Applicable - Patient had care per Anesthesia. Recommendation:           - Patient has a contact number available for                            emergencies. The signs and symptoms of potential                            delayed complications were discussed with the                            patient. Return to normal activities tomorrow.                            Written discharge instructions were provided to the                            patient.                           -  Resume previous diet.                           - Continue present medications.                           - Await pathology results.                           - Repeat colonoscopy in 2 years for surveillance                            with a TWO DAY prep. Procedure Code(s):        --- Professional ---                           [phone], Colonoscopy, flexible; with biopsy, single                            or multiple Diagnosis Code(s):        --- Professional ---                           K50.10, Crohn's disease of large intestine without                            complications                           K63.3, Ulcer of intestine                           Z98.0, Intestinal bypass and anastomosis status CPT copyright 2019 [employer]. All rights reserved. The codes documented in this report are preliminary and upon coder review may  be revised to meet current compliance requirements. Carol Ada, MD Carol Ada, MD 09/22/2019 11:30:01 AM This report has been signed electronically. Number of Addenda: 0

## 2019-09-25 ENCOUNTER — Encounter (HOSPITAL_COMMUNITY): Payer: Self-pay | Admitting: Gastroenterology

## 2019-09-25 LAB — SURGICAL PATHOLOGY

## 2019-10-23 DIAGNOSIS — K5090 Crohn's disease, unspecified, without complications: Secondary | ICD-10-CM | POA: Diagnosis not present

## 2019-11-07 DIAGNOSIS — E876 Hypokalemia: Secondary | ICD-10-CM | POA: Diagnosis not present

## 2019-11-07 DIAGNOSIS — K5090 Crohn's disease, unspecified, without complications: Secondary | ICD-10-CM | POA: Diagnosis not present

## 2019-11-08 DIAGNOSIS — I2510 Atherosclerotic heart disease of native coronary artery without angina pectoris: Secondary | ICD-10-CM | POA: Diagnosis not present

## 2019-11-08 DIAGNOSIS — F17210 Nicotine dependence, cigarettes, uncomplicated: Secondary | ICD-10-CM | POA: Diagnosis not present

## 2019-11-08 DIAGNOSIS — E782 Mixed hyperlipidemia: Secondary | ICD-10-CM | POA: Diagnosis not present

## 2019-11-08 DIAGNOSIS — Z9861 Coronary angioplasty status: Secondary | ICD-10-CM | POA: Diagnosis not present

## 2019-11-08 DIAGNOSIS — I10 Essential (primary) hypertension: Secondary | ICD-10-CM | POA: Diagnosis not present

## 2019-12-25 DIAGNOSIS — I739 Peripheral vascular disease, unspecified: Secondary | ICD-10-CM | POA: Diagnosis not present

## 2019-12-25 DIAGNOSIS — I2510 Atherosclerotic heart disease of native coronary artery without angina pectoris: Secondary | ICD-10-CM | POA: Diagnosis not present

## 2019-12-25 DIAGNOSIS — N189 Chronic kidney disease, unspecified: Secondary | ICD-10-CM | POA: Diagnosis not present

## 2019-12-25 DIAGNOSIS — K5090 Crohn's disease, unspecified, without complications: Secondary | ICD-10-CM | POA: Diagnosis not present

## 2019-12-25 DIAGNOSIS — R197 Diarrhea, unspecified: Secondary | ICD-10-CM | POA: Diagnosis not present

## 2019-12-25 DIAGNOSIS — M5127 Other intervertebral disc displacement, lumbosacral region: Secondary | ICD-10-CM | POA: Diagnosis not present

## 2019-12-25 DIAGNOSIS — E876 Hypokalemia: Secondary | ICD-10-CM | POA: Diagnosis not present

## 2019-12-25 DIAGNOSIS — M5416 Radiculopathy, lumbar region: Secondary | ICD-10-CM | POA: Diagnosis not present

## 2020-01-08 DIAGNOSIS — Z23 Encounter for immunization: Secondary | ICD-10-CM | POA: Diagnosis not present

## 2020-01-22 DIAGNOSIS — M609 Myositis, unspecified: Secondary | ICD-10-CM | POA: Diagnosis not present

## 2020-01-22 DIAGNOSIS — Z79899 Other long term (current) drug therapy: Secondary | ICD-10-CM | POA: Diagnosis not present

## 2020-01-22 DIAGNOSIS — M533 Sacrococcygeal disorders, not elsewhere classified: Secondary | ICD-10-CM | POA: Diagnosis not present

## 2020-01-22 DIAGNOSIS — M2550 Pain in unspecified joint: Secondary | ICD-10-CM | POA: Diagnosis not present

## 2020-01-22 DIAGNOSIS — R748 Abnormal levels of other serum enzymes: Secondary | ICD-10-CM | POA: Diagnosis not present

## 2020-01-22 DIAGNOSIS — K5090 Crohn's disease, unspecified, without complications: Secondary | ICD-10-CM | POA: Diagnosis not present

## 2020-01-22 DIAGNOSIS — M5136 Other intervertebral disc degeneration, lumbar region: Secondary | ICD-10-CM | POA: Diagnosis not present

## 2020-01-22 DIAGNOSIS — M549 Dorsalgia, unspecified: Secondary | ICD-10-CM | POA: Diagnosis not present

## 2020-01-29 DIAGNOSIS — R197 Diarrhea, unspecified: Secondary | ICD-10-CM | POA: Diagnosis not present

## 2020-01-29 DIAGNOSIS — K5090 Crohn's disease, unspecified, without complications: Secondary | ICD-10-CM | POA: Diagnosis not present

## 2020-01-29 DIAGNOSIS — R634 Abnormal weight loss: Secondary | ICD-10-CM | POA: Diagnosis not present

## 2020-01-29 DIAGNOSIS — Z23 Encounter for immunization: Secondary | ICD-10-CM | POA: Diagnosis not present

## 2020-02-01 ENCOUNTER — Other Ambulatory Visit: Payer: Self-pay

## 2020-02-01 ENCOUNTER — Emergency Department (HOSPITAL_COMMUNITY): Payer: Medicare Other

## 2020-02-01 ENCOUNTER — Encounter (HOSPITAL_COMMUNITY): Payer: Self-pay | Admitting: *Deleted

## 2020-02-01 ENCOUNTER — Inpatient Hospital Stay (HOSPITAL_COMMUNITY)
Admission: EM | Admit: 2020-02-01 | Discharge: 2020-02-06 | DRG: 682 | Disposition: A | Discharge status: Home / Self Care | Payer: Medicare Other | Attending: Internal Medicine | Admitting: Internal Medicine

## 2020-02-01 DIAGNOSIS — E876 Hypokalemia: Secondary | ICD-10-CM | POA: Diagnosis present

## 2020-02-01 DIAGNOSIS — I252 Old myocardial infarction: Secondary | ICD-10-CM

## 2020-02-01 DIAGNOSIS — Z20822 Contact with and (suspected) exposure to covid-19: Secondary | ICD-10-CM | POA: Diagnosis present

## 2020-02-01 DIAGNOSIS — R9431 Abnormal electrocardiogram [ECG] [EKG]: Secondary | ICD-10-CM | POA: Diagnosis not present

## 2020-02-01 DIAGNOSIS — K529 Noninfective gastroenteritis and colitis, unspecified: Secondary | ICD-10-CM | POA: Diagnosis present

## 2020-02-01 DIAGNOSIS — I2510 Atherosclerotic heart disease of native coronary artery without angina pectoris: Secondary | ICD-10-CM | POA: Diagnosis not present

## 2020-02-01 DIAGNOSIS — N179 Acute kidney failure, unspecified: Principal | ICD-10-CM | POA: Diagnosis present

## 2020-02-01 DIAGNOSIS — E782 Mixed hyperlipidemia: Secondary | ICD-10-CM | POA: Diagnosis not present

## 2020-02-01 DIAGNOSIS — K5010 Crohn's disease of large intestine without complications: Secondary | ICD-10-CM | POA: Diagnosis present

## 2020-02-01 DIAGNOSIS — I10 Essential (primary) hypertension: Secondary | ICD-10-CM | POA: Diagnosis present

## 2020-02-01 DIAGNOSIS — R197 Diarrhea, unspecified: Secondary | ICD-10-CM | POA: Diagnosis present

## 2020-02-01 DIAGNOSIS — E872 Acidosis, unspecified: Secondary | ICD-10-CM | POA: Diagnosis present

## 2020-02-01 DIAGNOSIS — K6389 Other specified diseases of intestine: Secondary | ICD-10-CM | POA: Diagnosis not present

## 2020-02-01 DIAGNOSIS — R531 Weakness: Secondary | ICD-10-CM

## 2020-02-01 DIAGNOSIS — Z955 Presence of coronary angioplasty implant and graft: Secondary | ICD-10-CM | POA: Diagnosis not present

## 2020-02-01 DIAGNOSIS — Z681 Body mass index (BMI) 19 or less, adult: Secondary | ICD-10-CM | POA: Diagnosis not present

## 2020-02-01 DIAGNOSIS — I9589 Other hypotension: Secondary | ICD-10-CM

## 2020-02-01 DIAGNOSIS — E860 Dehydration: Secondary | ICD-10-CM | POA: Diagnosis present

## 2020-02-01 DIAGNOSIS — I959 Hypotension, unspecified: Secondary | ICD-10-CM | POA: Diagnosis present

## 2020-02-01 DIAGNOSIS — E43 Unspecified severe protein-calorie malnutrition: Secondary | ICD-10-CM | POA: Diagnosis present

## 2020-02-01 DIAGNOSIS — N200 Calculus of kidney: Secondary | ICD-10-CM | POA: Diagnosis not present

## 2020-02-01 DIAGNOSIS — R7989 Other specified abnormal findings of blood chemistry: Secondary | ICD-10-CM | POA: Diagnosis present

## 2020-02-01 DIAGNOSIS — E861 Hypovolemia: Principal | ICD-10-CM | POA: Diagnosis present

## 2020-02-01 DIAGNOSIS — N281 Cyst of kidney, acquired: Secondary | ICD-10-CM | POA: Diagnosis not present

## 2020-02-01 DIAGNOSIS — R42 Dizziness and giddiness: Secondary | ICD-10-CM | POA: Diagnosis not present

## 2020-02-01 DIAGNOSIS — R000 Tachycardia, unspecified: Secondary | ICD-10-CM | POA: Diagnosis not present

## 2020-02-01 DIAGNOSIS — K50119 Crohn's disease of large intestine with unspecified complications: Secondary | ICD-10-CM | POA: Diagnosis not present

## 2020-02-01 DIAGNOSIS — R945 Abnormal results of liver function studies: Secondary | ICD-10-CM | POA: Diagnosis present

## 2020-02-01 DIAGNOSIS — M47816 Spondylosis without myelopathy or radiculopathy, lumbar region: Secondary | ICD-10-CM | POA: Diagnosis not present

## 2020-02-01 DIAGNOSIS — N390 Urinary tract infection, site not specified: Secondary | ICD-10-CM

## 2020-02-01 LAB — CBC
HCT: 49.1 % — ABNORMAL HIGH (ref 36.0–46.0)
Hemoglobin: 16.1 g/dL — ABNORMAL HIGH (ref 12.0–15.0)
MCH: 30.6 pg (ref 26.0–34.0)
MCHC: 32.8 g/dL (ref 30.0–36.0)
MCV: 93.2 fL (ref 80.0–100.0)
Platelets: 417 10*3/uL — ABNORMAL HIGH (ref 150–400)
RBC: 5.27 MIL/uL — ABNORMAL HIGH (ref 3.87–5.11)
RDW: 15.9 % — ABNORMAL HIGH (ref 11.5–15.5)
WBC: 5.2 10*3/uL (ref 4.0–10.5)
nRBC: 0 % (ref 0.0–0.2)

## 2020-02-01 LAB — URINALYSIS, ROUTINE W REFLEX MICROSCOPIC
Bilirubin Urine: NEGATIVE
Glucose, UA: NEGATIVE mg/dL
Ketones, ur: NEGATIVE mg/dL
Nitrite: POSITIVE — AB
Protein, ur: 100 mg/dL — AB
Specific Gravity, Urine: 1.01 (ref 1.005–1.030)
pH: 6 (ref 5.0–8.0)

## 2020-02-01 LAB — COMPREHENSIVE METABOLIC PANEL
ALT: 144 U/L — ABNORMAL HIGH (ref 0–44)
AST: 148 U/L — ABNORMAL HIGH (ref 15–41)
Albumin: 4.1 g/dL (ref 3.5–5.0)
Alkaline Phosphatase: 136 U/L — ABNORMAL HIGH (ref 38–126)
Anion gap: 14 (ref 5–15)
BUN: 24 mg/dL — ABNORMAL HIGH (ref 8–23)
CO2: 19 mmol/L — ABNORMAL LOW (ref 22–32)
Calcium: 10.3 mg/dL (ref 8.9–10.3)
Chloride: 105 mmol/L (ref 98–111)
Creatinine, Ser: 2.13 mg/dL — ABNORMAL HIGH (ref 0.44–1.00)
GFR, Estimated: 25 mL/min — ABNORMAL LOW (ref >60)
Glucose, Bld: 146 mg/dL — ABNORMAL HIGH (ref 70–99)
Potassium: 3.6 mmol/L (ref 3.5–5.1)
Sodium: 138 mmol/L (ref 135–145)
Total Bilirubin: 0.8 mg/dL (ref 0.3–1.2)
Total Protein: 8.9 g/dL — ABNORMAL HIGH (ref 6.5–8.1)

## 2020-02-01 LAB — LACTIC ACID, PLASMA
Lactic Acid, Venous: 3.4 mmol/L (ref 0.5–1.9)
Lactic Acid, Venous: 1.8 mmol/L (ref 0.5–1.9)

## 2020-02-01 LAB — RESPIRATORY PANEL BY RT PCR (FLU A&B, COVID)
Influenza A by PCR: NEGATIVE
Influenza B by PCR: NEGATIVE
SARS Coronavirus 2 by RT PCR: NEGATIVE

## 2020-02-01 LAB — PROTIME-INR
INR: 1.1 (ref 0.8–1.2)
Prothrombin Time: 13.5 seconds (ref 11.4–15.2)

## 2020-02-01 LAB — LIPASE, BLOOD: Lipase: 50 U/L (ref 11–51)

## 2020-02-01 LAB — MAGNESIUM: Magnesium: 2.1 mg/dL (ref 1.7–2.4)

## 2020-02-01 MED ORDER — LACTATED RINGERS IV BOLUS (SEPSIS)
1000.0000 mL | Freq: Once | INTRAVENOUS | Status: AC
  Administered 2020-02-01: 1000 mL via INTRAVENOUS

## 2020-02-01 MED ORDER — IOHEXOL 9 MG/ML PO SOLN
ORAL | Status: AC
  Filled 2020-02-01: qty 500

## 2020-02-01 MED ORDER — LACTATED RINGERS IV SOLN: INTRAVENOUS | Status: AC

## 2020-02-01 MED ORDER — OXYCODONE-ACETAMINOPHEN 5-325 MG PO TABS: 1.0000 | ORAL_TABLET | ORAL | Status: DC | PRN

## 2020-02-01 MED ORDER — SODIUM CHLORIDE 0.9 % IV SOLN: 2.0000 g | INTRAVENOUS | Status: DC

## 2020-02-01 MED ORDER — SODIUM CHLORIDE 0.9 % IV SOLN
2.0000 g | Freq: Once | INTRAVENOUS | Status: AC
  Administered 2020-02-01: 2 g via INTRAVENOUS
  Filled 2020-02-01: qty 2

## 2020-02-01 MED ORDER — PROCHLORPERAZINE EDISYLATE 10 MG/2ML IJ SOLN
10.0000 mg | Freq: Four times a day (QID) | INTRAMUSCULAR | Status: DC | PRN
  Administered 2020-02-02: 10 mg via INTRAVENOUS
  Filled 2020-02-01: qty 2

## 2020-02-01 MED ORDER — METRONIDAZOLE IN NACL 5-0.79 MG/ML-% IV SOLN
500.0000 mg | Freq: Once | INTRAVENOUS | Status: AC
  Administered 2020-02-01: 500 mg via INTRAVENOUS
  Filled 2020-02-01: qty 100

## 2020-02-01 MED ORDER — LACTATED RINGERS IV SOLN: INTRAVENOUS | Status: DC

## 2020-02-01 NOTE — ED Provider Notes (Signed)
[place] EMERGENCY DEPARTMENT Provider Note   CSN: [id number] Arrival date & time: 02/01/20  1552     History Chief Complaint  Patient presents with  . Hypotension    Anne [name] is a 64 y.o. female.  Patient with hx crohns disease, c/o generalized weakness in the past two weeks. Symptoms gradual onset, mod-severe, constant, persistent, slowly worse. States feels lightheaded/faint when stands. +diarrhea with loose to watery stools in past two weeks. No bloody in stools, no melena. No abd pain or distension. Nausea, decreased appetite. No vomiting. Mild intermittent headache. No acute, abrupt or severe headache. No neck pain or stiffness. No chest pain or discomfort. No sob. No dysuria or gu c/o. +chronic low back pain that occasionally radiates to left leg, states hx same, hx ddd, and denies acute or abrupt change in or worsening of this pain. No skin lesions/rash. No recent change in meds, d/c of meds, or new meds. No recent abx use. No fever or chills.   The history is provided by the patient.       Past Medical History:  Diagnosis Date  . Coronary artery disease   . Crohn's colitis ([place])   . Hypokalemia 08/04/2019  . STEMI (ST elevation myocardial infarction) ([place])    10/27/17 PCI/DES x1 to the dRCA, with residual thrombus in the PLA, normal EF    Patient Active Problem List   Diagnosis Date Noted  . Malnutrition of moderate degree 08/08/2019  . Hypokalemia 08/04/2019  . AKI (acute kidney injury) ([place])   . Generalized weakness   . Prolonged Q-T interval on ECG   . Coronary syndrome, acute ([place]) 04/25/2018  . STEMI (ST elevation myocardial infarction) ([place]) 10/26/2017  . STEMI involving right coronary artery ([place]) 10/26/2017    Past Surgical History:  Procedure Laterality Date  . BIOPSY  09/22/2019   Procedure: BIOPSY;  Surgeon: Carol Ada, MD;  Location: [facility];  Service: Endoscopy;;  . COLONOSCOPY WITH PROPOFOL N/A 09/22/2019    Procedure: COLONOSCOPY WITH PROPOFOL;  Surgeon: Carol Ada, MD;  Location: [facility];  Service: Endoscopy;  Laterality: N/A;  . CORONARY/GRAFT ACUTE MI REVASCULARIZATION N/A 10/26/2017   Procedure: Coronary/Graft Acute MI Revascularization;  Surgeon: Troy Sine, MD;  Location: [facility] CV LAB;  Service: Cardiovascular;  Laterality: N/A;  . LEFT HEART CATH AND CORONARY ANGIOGRAPHY N/A 10/26/2017   Procedure: LEFT HEART CATH AND CORONARY ANGIOGRAPHY;  Surgeon: Troy Sine, MD;  Location: [facility];  Service: Cardiovascular;  Laterality: N/A;  . LEFT HEART CATH AND CORONARY ANGIOGRAPHY Left 05/05/2018   Procedure: Left heart cath and coronary angiography;  Surgeon: Dionisio David, MD;  Location: [facility];  Service: Cardiovascular;  Laterality: Left;  Marland Kitchen MUSCLE BIOPSY Left 08/10/2019   Procedure: THIGH MUSCLE BIOPSY;  Surgeon: Coralie Keens, MD;  Location: [facility];  Service: General;  Laterality: Left;  . SMALL INTESTINE SURGERY       OB History   No obstetric history on file.     No family history on file.  Social History   Tobacco Use  . Smoking status: Current Every Day Smoker    Packs/day: 0.25    Years: 45.00    Pack years: 11.25    Types: Cigarettes  . Smokeless tobacco: Never Used  Vaping Use  . Vaping Use: Never used  Substance Use Topics  . Alcohol use: No  . Drug use: No    Home Medications Prior to Admission medications  Medication Sig Start Date End Date Taking? Authorizing Provider  acetaminophen-codeine (TYLENOL #4) 300-60 MG tablet Take 1 tablet by mouth every 4 (four) hours as needed for pain.    [provider]  aspirin EC 81 MG EC tablet Take 1 tablet (81 mg total) by mouth daily. Patient not taking: Reported on 08/04/2019 10/29/17   Reino Bellis B, NP  BRILINTA 60 MG TABS tablet Take 60 mg by mouth 2 (two) times daily. 05/26/19   [provider]  Cholecalciferol (VITAMIN D3) 50 MCG (2000 UT) TABS Take 1  tablet by mouth daily.    [provider]  cholestyramine (QUESTRAN) 4 g packet Take 4 g by mouth 2 (two) times daily.    [provider]  cyclobenzaprine (FLEXERIL) 10 MG tablet Take 10 mg by mouth 2 (two) times daily as needed for muscle spasms.  08/09/19   [provider]  enalapril (VASOTEC) 2.5 MG tablet Take 2.5 mg by mouth daily. 08/05/19   [provider]  metoprolol succinate (TOPROL-XL) 25 MG 24 hr tablet Take 0.5 tablets (12.5 mg total) by mouth daily. 08/13/19   Asencion Noble, MD  Multiple Vitamin (MULTIVITAMIN WITH MINERALS) TABS tablet Take 1 tablet by mouth daily. Centrum Silver    [provider]  nitroGLYCERIN (NITROSTAT) 0.4 MG SL tablet Place 1 tablet (0.4 mg total) under the tongue every 5 (five) minutes x 3 doses as needed for chest pain. 10/28/17   Reino Bellis B, NP  sodium bicarbonate 650 MG tablet Take 3 tablets (1,950 mg total) by mouth 2 (two) times daily. 08/12/19   Asencion Noble, MD    Allergies    Patient has no known allergies.  Review of Systems   Review of Systems  Constitutional: Negative for chills and fever.  HENT: Negative for sore throat and trouble swallowing.   Eyes: Negative for pain and visual disturbance.  Respiratory: Negative for cough and shortness of breath.   Cardiovascular: Negative for chest pain.  Gastrointestinal: Positive for diarrhea and nausea. Negative for abdominal distention, abdominal pain, blood in stool and vomiting.  Endocrine: Negative for polyuria.  Genitourinary: Negative for dysuria, flank pain and vaginal bleeding.  Musculoskeletal: Negative for back pain and neck pain.  Skin: Negative for rash.  Neurological: Positive for light-headedness. Negative for speech difficulty and numbness.  Hematological: Does not bruise/bleed easily.  Psychiatric/Behavioral: Negative for confusion.    Physical Exam Updated Vital Signs BP (!) 129/94   Pulse (!) 116   Temp 97.8 F (36.6  C) (Oral)   Resp (!) 24   Ht 1.753 m (5' 9" )   Wt 59.9 kg   LMP 02/18/2001   SpO2 97%   BMI 19.49 kg/m   Physical Exam Vitals and nursing note reviewed.  Constitutional:      Appearance: Normal appearance. She is well-developed.  HENT:     Head: Atraumatic.     Nose: Nose normal.     Mouth/Throat:     Mouth: Mucous membranes are moist.  Eyes:     General: No scleral icterus.    Conjunctiva/sclera: Conjunctivae normal.     Pupils: Pupils are equal, round, and reactive to light.  Neck:     Vascular: No carotid bruit.     Trachea: No tracheal deviation.     Comments: Thyroid not grossly enlarged or tender.  Cardiovascular:     Rate and Rhythm: Regular rhythm. Tachycardia present.     Pulses: Normal pulses.     Heart  sounds: Normal heart sounds. No murmur heard.  No friction rub. No gallop.   Pulmonary:     Effort: Pulmonary effort is normal. No respiratory distress.     Breath sounds: Normal breath sounds.  Abdominal:     General: Bowel sounds are normal. There is no distension.     Palpations: Abdomen is soft. There is no mass.     Tenderness: There is no abdominal tenderness. There is no guarding or rebound.     Hernia: No hernia is present.  Genitourinary:    Comments: No cva tenderness.  Musculoskeletal:        General: No swelling or tenderness.     Cervical back: Normal range of motion and neck supple. No rigidity. No muscular tenderness.     Right lower leg: No edema.     Left lower leg: No edema.  Skin:    General: Skin is warm and dry.     Findings: No rash.  Neurological:     Mental Status: She is alert.     Comments: Alert, speech normal. Motor/sens grossly intact bil. Steady gait.   Psychiatric:        Mood and Affect: Mood normal.     ED Results / Procedures / Treatments   Labs (all labs ordered are listed, but only abnormal results are displayed) Results for orders placed or performed during the hospital encounter of 02/01/20  Respiratory Panel  by RT PCR (Flu A&B, Covid) - Nasopharyngeal Swab   Specimen: Nasopharyngeal Swab  Result Value Ref Range   SARS Coronavirus 2 by RT PCR NEGATIVE NEGATIVE   Influenza A by PCR NEGATIVE NEGATIVE   Influenza B by PCR NEGATIVE NEGATIVE  Lipase, blood  Result Value Ref Range   Lipase 50 11 - 51 U/L  Comprehensive metabolic panel  Result Value Ref Range   Sodium 138 135 - 145 mmol/L   Potassium 3.6 3.5 - 5.1 mmol/L   Chloride 105 98 - 111 mmol/L   CO2 19 (L) 22 - 32 mmol/L   Glucose, Bld 146 (H) 70 - 99 mg/dL   BUN 24 (H) 8 - 23 mg/dL   Creatinine, Ser 2.13 (H) 0.44 - 1.00 mg/dL   Calcium 10.3 8.9 - 10.3 mg/dL   Total Protein 8.9 (H) 6.5 - 8.1 g/dL   Albumin 4.1 3.5 - 5.0 g/dL   AST 148 (H) 15 - 41 U/L   ALT 144 (H) 0 - 44 U/L   Alkaline Phosphatase 136 (H) 38 - 126 U/L   Total Bilirubin 0.8 0.3 - 1.2 mg/dL   GFR, Estimated 25 (L) >60 mL/min   Anion gap 14 5 - 15  CBC  Result Value Ref Range   WBC 5.2 4.0 - 10.5 K/uL   RBC 5.27 (H) 3.87 - 5.11 MIL/uL   Hemoglobin 16.1 (H) 12.0 - 15.0 g/dL   HCT 49.1 (H) 36 - 46 %   MCV 93.2 80.0 - 100.0 fL   MCH 30.6 26.0 - 34.0 pg   MCHC 32.8 30.0 - 36.0 g/dL   RDW 15.9 (H) 11.5 - 15.5 %   Platelets 417 (H) 150 - 400 K/uL   nRBC 0.0 0.0 - 0.2 %  Urinalysis, Routine w reflex microscopic Nasopharyngeal Swab  Result Value Ref Range   Color, Urine YELLOW YELLOW   APPearance HAZY (A) CLEAR   Specific Gravity, Urine 1.010 1.005 - 1.030   pH 6.0 5.0 - 8.0   Glucose, UA NEGATIVE NEGATIVE mg/dL   Hgb  urine dipstick SMALL (A) NEGATIVE   Bilirubin Urine NEGATIVE NEGATIVE   Ketones, ur NEGATIVE NEGATIVE mg/dL   Protein, ur 100 (A) NEGATIVE mg/dL   Nitrite POSITIVE (A) NEGATIVE   Leukocytes,Ua TRACE (A) NEGATIVE   RBC / HPF 0-5 0 - 5 RBC/hpf   WBC, UA 11-20 0 - 5 WBC/hpf   Bacteria, UA RARE (A) NONE SEEN   Squamous Epithelial / LPF 0-5 0 - 5   Mucus PRESENT    Hyaline Casts, UA PRESENT   Magnesium  Result Value Ref Range   Magnesium 2.1 1.7  - 2.4 mg/dL  Lactic acid, plasma  Result Value Ref Range   Lactic Acid, Venous 3.4 (HH) 0.5 - 1.9 mmol/L  Lactic acid, plasma  Result Value Ref Range   Lactic Acid, Venous 1.8 0.5 - 1.9 mmol/L  Protime-INR  Result Value Ref Range   Prothrombin Time 13.5 11.4 - 15.2 seconds   INR 1.1 0.8 - 1.2    EKG EKG Interpretation  Date/Time:  Thursday February 01 2020 16:08:22 EDT Ventricular Rate:  116 PR Interval:  116 QRS Duration: 78 QT Interval:  344 QTC Calculation: 478 R Axis:   62 Text Interpretation: Sinus tachycardia Nonspecific T wave abnormality Confirmed by Lajean Saver [phone]) on 02/01/2020 5:15:06 PM   Radiology DG Chest Port 1 View  Result Date: 02/01/2020 CLINICAL DATA:  Weakness and dizziness EXAM: PORTABLE CHEST 1 VIEW COMPARISON:  08/04/2019 FINDINGS: The heart size and mediastinal contours are within normal limits. Both lungs are clear. The visualized skeletal structures are unremarkable. Postsurgical changes are seen in the cervical spine. IMPRESSION: No active disease. Electronically Signed   By: Inez Catalina M.D.   On: 02/01/2020 18:10    Procedures Procedures (including critical care time)  Medications Ordered in ED Medications  lactated ringers infusion (has no administration in time range)  lactated ringers bolus 1,000 mL (has no administration in time range)    And  lactated ringers bolus 1,000 mL (has no administration in time range)  ceFEPIme (MAXIPIME) 2 g in sodium chloride 0.9 % 100 mL IVPB (has no administration in time range)  metroNIDAZOLE (FLAGYL) IVPB 500 mg (has no administration in time range)    ED Course  I have reviewed the triage vital signs and the nursing notes.  Pertinent labs & imaging results that were available during my care of the patient were reviewed by me and considered in my medical decision making (see chart for details).    MDM Rules/Calculators/A&P                         BP is low. Recent diarrhea. Sepsis orders  placed. Iv ns boluses. Stat labs. Continuous pulse ox and cardiac monitoring.   Reviewed nursing notes and prior charts for additional history.   Cultures sent. Iv abx. 30 cc/kg iv fluids.   Recheck, bp improved.   Labs reviewed/interpreted by me - wbc normal.  Await chem/additional labs.   Additional labs reviewed/interpreted by me - lactate high, hco3 low, aki - felt most likely c/w volume depletion, dehydration, and not sepsis.   CXR reviewed/interpreted by me - no pna.   Additional labs reviewed - repeat lactate normal. bp normal.   Medicine service consulted for admission.  CT reviewed/interpreted by me - c/w acute diarrheal illness. No hem.   CRITICAL CARE RE Hypotension, diarrhea/hypovolemia, r/o uti, AKI, elevated lactate Performed by: Mirna Mires Total critical care time: 110 minutes Critical care  time was exclusive of separately billable procedures and treating other patients. Critical care was necessary to treat or prevent imminent or life-threatening deterioration. Critical care was time spent personally by me on the following activities: development of treatment plan with patient and/or surrogate as well as nursing, discussions with consultants, evaluation of patient's response to treatment, examination of patient, obtaining history from patient or surrogate, ordering and performing treatments and interventions, ordering and review of laboratory studies, ordering and review of radiographic studies, pulse oximetry and re-evaluation of patient's condition.     Final Clinical Impression(s) / ED Diagnoses Final diagnoses:  None    Rx / DC Orders ED Discharge Orders    None       Lajean Saver, MD 02/01/20 2159

## 2020-02-01 NOTE — Progress Notes (Signed)
Pharmacy Antibiotic Note  Anne [name] is a 64 y.o. female admitted on 02/01/2020 with intra abdominal .  Pharmacy has been consulted for cefepime dosing. Pt reports symptoms of diarrhea x2wks with dizziness upon standing. Pt WBC 5.2 and afebrile. This is day 1 of therapy. Pt also received 1 dose of flagyl.   Plan: Cefepime 2g IV q24h Monitor clinical progress, micro data, CBC and renal function De-escalate abx as able   Height: 5' 9"  (175.3 cm) Weight: 59.9 kg (132 lb) IBW/kg (Calculated) : 66.2  Temp (24hrs), Avg:97.8 F (36.6 C), Min:97.8 F (36.6 C), Max:97.8 F (36.6 C)  Recent Labs  Lab 02/01/20 1625  WBC 5.2  CREATININE 2.13*    Estimated Creatinine Clearance: 24.6 mL/min (A) (by C-G formula based on SCr of 2.13 mg/dL (H)).    No Known Allergies  Antimicrobials this admission: Cefepime 10/21> Flagyl x1 10/21   Microbiology results: 10/21 Bcx ordered 10/21 resp panel ordered 10/21 UA and UCX ordered  Thank you for allowing pharmacy to be a part of this patient's care.  Carolin Guernsey  PGY1 Pharmacy resident 02/01/2020 5:51 PM

## 2020-02-01 NOTE — ED Triage Notes (Signed)
Pt is here with generalized weakness and dizziness when getting up.  Pt hypotensive in 70's in triage.  Pt has been having diarrhea for 2 weeks which she states is brown.

## 2020-02-02 ENCOUNTER — Encounter (HOSPITAL_COMMUNITY): Payer: Self-pay | Admitting: Internal Medicine

## 2020-02-02 DIAGNOSIS — K529 Noninfective gastroenteritis and colitis, unspecified: Secondary | ICD-10-CM | POA: Diagnosis not present

## 2020-02-02 DIAGNOSIS — E872 Acidosis: Secondary | ICD-10-CM

## 2020-02-02 DIAGNOSIS — R7989 Other specified abnormal findings of blood chemistry: Secondary | ICD-10-CM | POA: Diagnosis present

## 2020-02-02 DIAGNOSIS — R9431 Abnormal electrocardiogram [ECG] [EKG]: Secondary | ICD-10-CM | POA: Diagnosis not present

## 2020-02-02 DIAGNOSIS — E876 Hypokalemia: Secondary | ICD-10-CM | POA: Diagnosis present

## 2020-02-02 DIAGNOSIS — E43 Unspecified severe protein-calorie malnutrition: Secondary | ICD-10-CM

## 2020-02-02 DIAGNOSIS — Z955 Presence of coronary angioplasty implant and graft: Secondary | ICD-10-CM | POA: Diagnosis not present

## 2020-02-02 DIAGNOSIS — I2510 Atherosclerotic heart disease of native coronary artery without angina pectoris: Secondary | ICD-10-CM

## 2020-02-02 DIAGNOSIS — I10 Essential (primary) hypertension: Secondary | ICD-10-CM | POA: Diagnosis present

## 2020-02-02 DIAGNOSIS — N179 Acute kidney failure, unspecified: Principal | ICD-10-CM

## 2020-02-02 DIAGNOSIS — E782 Mixed hyperlipidemia: Secondary | ICD-10-CM | POA: Diagnosis present

## 2020-02-02 DIAGNOSIS — K5010 Crohn's disease of large intestine without complications: Secondary | ICD-10-CM | POA: Diagnosis present

## 2020-02-02 DIAGNOSIS — R197 Diarrhea, unspecified: Secondary | ICD-10-CM | POA: Diagnosis present

## 2020-02-02 DIAGNOSIS — Z681 Body mass index (BMI) 19 or less, adult: Secondary | ICD-10-CM | POA: Diagnosis not present

## 2020-02-02 DIAGNOSIS — Z20822 Contact with and (suspected) exposure to covid-19: Secondary | ICD-10-CM | POA: Diagnosis present

## 2020-02-02 DIAGNOSIS — R531 Weakness: Secondary | ICD-10-CM | POA: Diagnosis not present

## 2020-02-02 DIAGNOSIS — I252 Old myocardial infarction: Secondary | ICD-10-CM | POA: Diagnosis not present

## 2020-02-02 DIAGNOSIS — K50119 Crohn's disease of large intestine with unspecified complications: Secondary | ICD-10-CM | POA: Diagnosis not present

## 2020-02-02 DIAGNOSIS — E860 Dehydration: Secondary | ICD-10-CM | POA: Diagnosis present

## 2020-02-02 DIAGNOSIS — R945 Abnormal results of liver function studies: Secondary | ICD-10-CM | POA: Diagnosis not present

## 2020-02-02 DIAGNOSIS — I959 Hypotension, unspecified: Secondary | ICD-10-CM | POA: Diagnosis present

## 2020-02-02 DIAGNOSIS — E861 Hypovolemia: Secondary | ICD-10-CM | POA: Diagnosis present

## 2020-02-02 LAB — CBC WITH DIFFERENTIAL/PLATELET
Abs Immature Granulocytes: 0.02 10*3/uL (ref 0.00–0.07)
Basophils Absolute: 0 10*3/uL (ref 0.0–0.1)
Basophils Relative: 0 %
Eosinophils Absolute: 0 10*3/uL (ref 0.0–0.5)
Eosinophils Relative: 0 %
HCT: 39.7 % (ref 36.0–46.0)
Hemoglobin: 13.7 g/dL (ref 12.0–15.0)
Immature Granulocytes: 0 %
Lymphocytes Relative: 25 %
Lymphs Abs: 1.9 10*3/uL (ref 0.7–4.0)
MCH: 31.1 pg (ref 26.0–34.0)
MCHC: 34.5 g/dL (ref 30.0–36.0)
MCV: 90.2 fL (ref 80.0–100.0)
Monocytes Absolute: 1.1 10*3/uL — ABNORMAL HIGH (ref 0.1–1.0)
Monocytes Relative: 15 %
Neutro Abs: 4.4 10*3/uL (ref 1.7–7.7)
Neutrophils Relative %: 60 %
Platelets: 300 10*3/uL (ref 150–400)
RBC: 4.4 MIL/uL (ref 3.87–5.11)
RDW: 15.3 % (ref 11.5–15.5)
WBC: 7.5 10*3/uL (ref 4.0–10.5)
nRBC: 0 % (ref 0.0–0.2)

## 2020-02-02 LAB — COMPREHENSIVE METABOLIC PANEL
ALT: 104 U/L — ABNORMAL HIGH (ref 0–44)
AST: 94 U/L — ABNORMAL HIGH (ref 15–41)
Albumin: 3.1 g/dL — ABNORMAL LOW (ref 3.5–5.0)
Alkaline Phosphatase: 99 U/L (ref 38–126)
Anion gap: 13 (ref 5–15)
BUN: 22 mg/dL (ref 8–23)
CO2: 21 mmol/L — ABNORMAL LOW (ref 22–32)
Calcium: 9.3 mg/dL (ref 8.9–10.3)
Chloride: 105 mmol/L (ref 98–111)
Creatinine, Ser: 1.63 mg/dL — ABNORMAL HIGH (ref 0.44–1.00)
GFR, Estimated: 35 mL/min — ABNORMAL LOW (ref >60)
Glucose, Bld: 106 mg/dL — ABNORMAL HIGH (ref 70–99)
Potassium: 3.1 mmol/L — ABNORMAL LOW (ref 3.5–5.1)
Sodium: 139 mmol/L (ref 135–145)
Total Bilirubin: 1 mg/dL (ref 0.3–1.2)
Total Protein: 6.8 g/dL (ref 6.5–8.1)

## 2020-02-02 LAB — C-REACTIVE PROTEIN: CRP: 0.9 mg/dL (ref <1.0)

## 2020-02-02 LAB — CK: Total CK: 91 U/L (ref 38–234)

## 2020-02-02 LAB — PHOSPHORUS: Phosphorus: 3 mg/dL (ref 2.5–4.6)

## 2020-02-02 LAB — MAGNESIUM: Magnesium: 1.6 mg/dL — ABNORMAL LOW (ref 1.7–2.4)

## 2020-02-02 MED ORDER — ACETAMINOPHEN-CODEINE #3 300-30 MG PO TABS
1.0000 | ORAL_TABLET | ORAL | Status: DC | PRN
  Filled 2020-02-02: qty 1

## 2020-02-02 MED ORDER — MAGNESIUM SULFATE 2 GM/50ML IV SOLN
2.0000 g | Freq: Once | INTRAVENOUS | Status: AC
  Administered 2020-02-02: 2 g via INTRAVENOUS
  Filled 2020-02-02: qty 50

## 2020-02-02 MED ORDER — NITROGLYCERIN 0.4 MG SL SUBL: 0.4000 mg | SUBLINGUAL_TABLET | SUBLINGUAL | Status: DC | PRN

## 2020-02-02 MED ORDER — ACETAMINOPHEN-CODEINE #3 300-30 MG PO TABS
2.0000 | ORAL_TABLET | ORAL | Status: DC | PRN
  Administered 2020-02-02 – 2020-02-06 (×8): 2 via ORAL
  Filled 2020-02-02 (×9): qty 2

## 2020-02-02 MED ORDER — BOOST / RESOURCE BREEZE PO LIQD CUSTOM
1.0000 | Freq: Three times a day (TID) | ORAL | Status: DC
  Administered 2020-02-02 – 2020-02-06 (×13): 1 via ORAL

## 2020-02-02 MED ORDER — ENALAPRIL MALEATE 2.5 MG PO TABS
2.5000 mg | ORAL_TABLET | Freq: Every day | ORAL | Status: DC
  Administered 2020-02-02: 2.5 mg via ORAL
  Filled 2020-02-02 (×2): qty 1

## 2020-02-02 MED ORDER — ACETAMINOPHEN 650 MG RE SUPP: 650.0000 mg | Freq: Four times a day (QID) | RECTAL | Status: DC | PRN

## 2020-02-02 MED ORDER — POTASSIUM CHLORIDE CRYS ER 20 MEQ PO TBCR
40.0000 meq | EXTENDED_RELEASE_TABLET | Freq: Once | ORAL | Status: AC
  Administered 2020-02-02: 40 meq via ORAL
  Filled 2020-02-02: qty 2

## 2020-02-02 MED ORDER — TICAGRELOR 60 MG PO TABS
60.0000 mg | ORAL_TABLET | Freq: Two times a day (BID) | ORAL | Status: DC
  Administered 2020-02-02 – 2020-02-06 (×10): 60 mg via ORAL
  Filled 2020-02-02 (×12): qty 1

## 2020-02-02 MED ORDER — CYCLOBENZAPRINE HCL 10 MG PO TABS
10.0000 mg | ORAL_TABLET | Freq: Every day | ORAL | Status: DC
  Administered 2020-02-02 – 2020-02-05 (×5): 10 mg via ORAL
  Filled 2020-02-02 (×5): qty 1

## 2020-02-02 MED ORDER — ENOXAPARIN SODIUM 30 MG/0.3ML ~~LOC~~ SOLN
30.0000 mg | Freq: Every day | SUBCUTANEOUS | Status: DC
  Administered 2020-02-02 – 2020-02-03 (×2): 30 mg via SUBCUTANEOUS
  Filled 2020-02-02 (×2): qty 0.3

## 2020-02-02 MED ORDER — CHOLESTYRAMINE 4 G PO PACK
4.0000 g | PACK | Freq: Two times a day (BID) | ORAL | Status: DC
  Administered 2020-02-02 – 2020-02-06 (×8): 4 g via ORAL
  Filled 2020-02-02 (×12): qty 1

## 2020-02-02 MED ORDER — SODIUM BICARBONATE 650 MG PO TABS
1950.0000 mg | ORAL_TABLET | Freq: Two times a day (BID) | ORAL | Status: DC
  Administered 2020-02-02 – 2020-02-06 (×10): 1950 mg via ORAL
  Filled 2020-02-02 (×12): qty 3

## 2020-02-02 MED ORDER — ACETAMINOPHEN 325 MG PO TABS
650.0000 mg | ORAL_TABLET | Freq: Four times a day (QID) | ORAL | Status: DC | PRN
  Filled 2020-02-02: qty 2

## 2020-02-02 MED ORDER — POLYETHYLENE GLYCOL 3350 17 G PO PACK: 17.0000 g | PACK | Freq: Every day | ORAL | Status: DC | PRN

## 2020-02-02 MED ORDER — METOPROLOL SUCCINATE ER 25 MG PO TB24: 12.5000 mg | ORAL_TABLET | Freq: Every day | ORAL | Status: DC

## 2020-02-02 NOTE — Progress Notes (Signed)
Patient placed in observation after midnight but care began in the ER prior.  Continues to have AKI and reported diarrhea.  Tolerating very limited PO intake.  If continues to have issues in the AM with ? Flare will discuss with GI Dr. Nicholas Lose DO

## 2020-02-02 NOTE — H&P (Addendum)
History and Physical    Anne [name] BVQ:[id number] DOB: [birth year] DOA: 02/01/2020  PCP: Jodi Marble, MD  Patient coming from: Home   Chief Complaint:  Chief Complaint  Patient presents with  . Hypotension     HPI:    64 year old female with past medical history of Crohn's disease (Dx'ed 1994, follows with Dr. Laury Axon artery disease (S/P STEMI 2019 with DES to RCA), avascular necrosis of the right hip who presents to [facility] emergency department with complaints of progressively worsening generalized weakness and diarrhea.  Patient explains that she has been suffering from waxing and waning loose stools and diarrhea for at least the past several months now.  In the past 2 weeks, patient has been experiencing increasing intensity of her diarrhea, experiencing several episodes daily.  Diarrhea is nonbloody, occasionally loose and occasionally watery.  Patient is complaining of associated abdominal cramping.  Abdominal cramping is waxing and waning in quality, severe in intensity, located in the right side of the abdomen and nonradiating.  Over the past 2 weeks of the patient's symptoms have persisted patient has begun to develop associated generalized weakness and poor appetite.  In the past several days patient has been experiencing severe lightheadedness as well, particularly with rising from a seated position.  Due to patient's progressively worsening symptoms patient eventually presented to [facility] emergency department for evaluation.    Upon evaluation in the emergency department, patient was initially found to be hypotensive with blood pressure of 77/51.  Patient was clinically found to be dehydrated with concurrent acute kidney injury and creatinine of 2.13.  Patient received initial dosing of intravenous cefepime and Flagyl by the emergency department staff.  Patient was also given 2000 cc of LR bolus.  The hospitalist group was then called to  assess the patient for admission to the hospital.   Review of Systems:   Review of Systems  Constitutional: Positive for malaise/fatigue and weight loss.  Gastrointestinal: Positive for abdominal pain and diarrhea.  Neurological: Positive for weakness.    Past Medical History:  Diagnosis Date  . Coronary artery disease   . Crohn's colitis ([place])   . Hypokalemia 08/04/2019  . STEMI (ST elevation myocardial infarction) ([place])    10/27/17 PCI/DES x1 to the dRCA, with residual thrombus in the PLA, normal EF    Past Surgical History:  Procedure Laterality Date  . BIOPSY  09/22/2019   Procedure: BIOPSY;  Surgeon: Carol Ada, MD;  Location: [facility];  Service: Endoscopy;;  . COLONOSCOPY WITH PROPOFOL N/A 09/22/2019   Procedure: COLONOSCOPY WITH PROPOFOL;  Surgeon: Carol Ada, MD;  Location: [facility] ENDOSCOPY;  Service: Endoscopy;  Laterality: N/A;  . CORONARY/GRAFT ACUTE MI REVASCULARIZATION N/A 10/26/2017   Procedure: Coronary/Graft Acute MI Revascularization;  Surgeon: Troy Sine, MD;  Location: [place] CV LAB;  Service: Cardiovascular;  Laterality: N/A;  . LEFT HEART CATH AND CORONARY ANGIOGRAPHY N/A 10/26/2017   Procedure: LEFT HEART CATH AND CORONARY ANGIOGRAPHY;  Surgeon: Troy Sine, MD;  Location: [place] CV LAB;  Service: Cardiovascular;  Laterality: N/A;  . LEFT HEART CATH AND CORONARY ANGIOGRAPHY Left 05/05/2018   Procedure: Left heart cath and coronary angiography;  Surgeon: Dionisio David, MD;  Location: [place] CV LAB;  Service: Cardiovascular;  Laterality: Left;  Marland Kitchen MUSCLE BIOPSY Left 08/10/2019   Procedure: THIGH MUSCLE BIOPSY;  Surgeon: Coralie Keens, MD;  Location: [place];  Service: General;  Laterality: Left;  . SMALL INTESTINE SURGERY  reports that she has quit smoking. Her smoking use included cigarettes. She has a 11.25 pack-year smoking history. She has never used smokeless tobacco. She reports that she does not drink alcohol and does not  use drugs.  No Known Allergies  Family History  Problem Relation Age of Onset  . Crohn's disease Neg Hx      Prior to Admission medications   Medication Sig Start Date End Date Taking? Authorizing Provider  acetaminophen-codeine (TYLENOL #4) 300-60 MG tablet Take 1 tablet by mouth every 4 (four) hours as needed for pain.   Yes [provider]  BRILINTA 60 MG TABS tablet Take 60 mg by mouth 2 (two) times daily. 05/26/19  Yes [provider]  cholestyramine (QUESTRAN) 4 g packet Take 4 g by mouth 2 (two) times daily.   Yes [provider]  cyclobenzaprine (FLEXERIL) 10 MG tablet Take 10 mg by mouth at bedtime.  08/09/19  Yes [provider]  enalapril (VASOTEC) 2.5 MG tablet Take 2.5 mg by mouth daily. 08/05/19  Yes [provider]  metoprolol succinate (TOPROL-XL) 25 MG 24 hr tablet Take 0.5 tablets (12.5 mg total) by mouth daily. 08/13/19  Yes Asencion Noble, MD  nitroGLYCERIN (NITROSTAT) 0.4 MG SL tablet Place 1 tablet (0.4 mg total) under the tongue every 5 (five) minutes x 3 doses as needed for chest pain. 10/28/17  Yes Reino Bellis B, NP  rosuvastatin (CRESTOR) 40 MG tablet Take 40 mg by mouth daily. 11/08/19  Yes [provider]  sodium bicarbonate 650 MG tablet Take 3 tablets (1,950 mg total) by mouth 2 (two) times daily. 08/12/19  Yes Asencion Noble, MD  aspirin EC 81 MG EC tablet Take 1 tablet (81 mg total) by mouth daily. Patient not taking: Reported on 08/04/2019 10/29/17   Cheryln Manly, NP    Physical Exam: Vitals:   02/01/20 2345 02/02/20 0000 02/02/20 0058 02/02/20 0204  BP: 100/79 97/79 91/69  (!) 108/57  Pulse: 92 88 90 88  Resp: 18 13 16 16   Temp:   98.2 F (36.8 C)   TempSrc:   Oral   SpO2: 99% 98% 99% 99%  Weight:      Height:        Constitutional: Lethargic but arousable and oriented x3, patient is in distress due to abdominal pain.  Patient is cachectic. Skin: no rashes, no lesions, extremely poor  skin turgor noted. Eyes: Pupils are equally reactive to light.  No evidence of scleral icterus or conjunctival pallor.  ENMT: Dry mucous membranes noted.  Posterior pharynx clear of any exudate or lesions.   Neck: normal, supple, no masses, no thyromegaly.  No evidence of jugular venous distension.   Respiratory: clear to auscultation bilaterally, no wheezing, no crackles. Normal respiratory effort. No accessory muscle use.  Cardiovascular: Regular rate and rhythm, no murmurs / rubs / gallops. No extremity edema. 2+ pedal pulses. No carotid bruits.  Chest:   Nontender without crepitus or deformity.   Back:   Nontender without crepitus or deformity. Abdomen: Notable right-sided abdominal tenderness.  Abdomen is soft however.  No evidence of intra-abdominal masses.  Positive bowel sounds noted in all quadrants.   Musculoskeletal: No joint deformity upper and lower extremities. Good ROM, no contractures. Normal muscle tone.  Neurologic: CN 2-12 grossly intact. Sensation intact.  Patient moving all 4 extremities spontaneously.  Patient is following all commands.  Patient is responsive to verbal stimuli.   Psychiatric: Patient exhibits normal mood with appropriate affect.  Patient seems to possess insight as to their current situation.     Labs on Admission: I have personally reviewed following labs and imaging studies -   CBC: Recent Labs  Lab 02/01/20 1625  WBC 5.2  HGB 16.1*  HCT 49.1*  MCV 93.2  PLT 782*   Basic Metabolic Panel: Recent Labs  Lab 02/01/20 1625 02/01/20 1750  NA 138  --   K 3.6  --   CL 105  --   CO2 19*  --   GLUCOSE 146*  --   BUN 24*  --   CREATININE 2.13*  --   CALCIUM 10.3  --   MG  --  2.1   GFR: Estimated Creatinine Clearance: 24.6 mL/min (A) (by C-G formula based on SCr of 2.13 mg/dL (H)). Liver Function Tests: Recent Labs  Lab 02/01/20 1625  AST 148*  ALT 144*  ALKPHOS 136*  BILITOT 0.8  PROT 8.9*  ALBUMIN 4.1   Recent Labs  Lab  02/01/20 1625  LIPASE 50   No results for input(s): AMMONIA in the last 168 hours. Coagulation Profile: Recent Labs  Lab 02/01/20 1750  INR 1.1   Cardiac Enzymes: No results for input(s): CKTOTAL, CKMB, CKMBINDEX, TROPONINI in the last 168 hours. BNP (last 3 results) No results for input(s): PROBNP in the last 8760 hours. HbA1C: No results for input(s): HGBA1C in the last 72 hours. CBG: No results for input(s): GLUCAP in the last 168 hours. Lipid Profile: No results for input(s): CHOL, HDL, LDLCALC, TRIG, CHOLHDL, LDLDIRECT in the last 72 hours. Thyroid Function Tests: No results for input(s): TSH, T4TOTAL, FREET4, T3FREE, THYROIDAB in the last 72 hours. Anemia Panel: No results for input(s): VITAMINB12, FOLATE, FERRITIN, TIBC, IRON, RETICCTPCT in the last 72 hours. Urine analysis:    Component Value Date/Time   COLORURINE YELLOW 02/01/2020 2030   APPEARANCEUR HAZY (A) 02/01/2020 2030   LABSPEC 1.010 02/01/2020 2030   PHURINE 6.0 02/01/2020 2030   GLUCOSEU NEGATIVE 02/01/2020 2030   HGBUR SMALL (A) 02/01/2020 2030   [place] NEGATIVE 02/01/2020 2030   [place] 02/01/2020 2030   PROTEINUR 100 (A) 02/01/2020 2030   NITRITE POSITIVE (A) 02/01/2020 2030   LEUKOCYTESUR TRACE (A) 02/01/2020 2030    Radiological Exams on Admission - Personally Reviewed: CT Abdomen Pelvis Wo Contrast  Result Date: 02/01/2020 CLINICAL DATA:  64 year old female with abdominal pain. EXAM: CT ABDOMEN AND PELVIS WITHOUT CONTRAST TECHNIQUE: Multidetector CT imaging of the abdomen and pelvis was performed following the standard protocol without IV contrast. COMPARISON:  Abdominal MRI dated 08/07/2019. FINDINGS: Evaluation of this exam is limited in the absence of intravenous contrast. Lower chest: The visualized lung bases are clear. There is coronary vascular calcification. No intra-abdominal free air or free fluid. Hepatobiliary: Diffuse fatty liver. No intrahepatic biliary ductal  dilatation. Cholecystectomy. No retained calcified stone noted in the central CBD. Pancreas: Unremarkable. No pancreatic ductal dilatation or surrounding inflammatory changes. Spleen: Normal in size without focal abnormality. Adrenals/Urinary Tract: The adrenal glands unremarkable. There is a 3 mm nonobstructing right renal inferior pole calculus. Additional punctate stone noted in the interpolar right kidney. No hydronephrosis. A 3.5 cm right renal upper pole cyst. There is no hydronephrosis or nephrolithiasis on the left. The visualized ureters and urinary bladder appear unremarkable. Stomach/Bowel: There are postsurgical changes of the bowel with anastomotic suture in the right lower quadrant. There is an 11 x 18 mm focal area of nodularity adjacent to the suture which is not well evaluated but  may represent scarring (coronal 43/6). There is mild diffuse dilatation of small-bowel loops measuring up to 3.1 cm. No definite transition identified. Oral contrast is noted in the proximal colon. Liquid stool noted throughout the colon compatible with diarrheal state. Vascular/Lymphatic: Mild aortoiliac atherosclerotic disease. The IVC is unremarkable. No portal venous gas. There is no adenopathy. Reproductive: The uterus is retroflexed.  No adnexal masses. Other: None Musculoskeletal: Degenerative changes of the lower lumbar spine. No acute osseous pathology. Avascular necrosis of the right femoral head. No acute fracture or cortical collapse. IMPRESSION: 1. Mildly dilated small bowel loops without transition may represent ileus. Clinical correlation is recommended. 2. Diarrheal state. Correlation with clinical exam and stool cultures recommended. 3. Fatty liver. 4. Nonobstructing right renal calculi. No hydronephrosis. 5. Aortic Atherosclerosis (ICD10-I70.0). Electronically Signed   By: Anner Crete M.D.   On: 02/01/2020 21:49   DG Chest Port 1 View  Result Date: 02/01/2020 CLINICAL DATA:  Weakness and  dizziness EXAM: PORTABLE CHEST 1 VIEW COMPARISON:  08/04/2019 FINDINGS: The heart size and mediastinal contours are within normal limits. Both lungs are clear. The visualized skeletal structures are unremarkable. Postsurgical changes are seen in the cervical spine. IMPRESSION: No active disease. Electronically Signed   By: Inez Catalina M.D.   On: 02/01/2020 18:10    EKG: Personally reviewed.  Rhythm is sinus tachycardia with heart rate of 116 bpm.  No dynamic ST segment changes appreciated.  Assessment/Plan Principal Problem:   AKI (acute kidney injury) ([place])   Patient suffering from acute kidney injury, likely prerenal in origin from excessive volume depletion  Hydrating patient with intravenous isotonic fluids  Strict input and output monitoring  Minimizing nephrotoxic agents  Monitoring renal function and electrolytes with serial chemistries  Active Problems:   Chronic diarrhea   Patient reports at least a several month history of ongoing diarrhea, now seemingly chronic  CT imaging of the abdomen does reveal diarrhea throughout the colon.  It also brings up concern for possible ileus although clinically I do not believe this is compatible with the patient's presentation.  Attempting to improve patient's hypovolemia, hypotension, acute kidney injury and lactic acidosis with aggressive intravenous hydration  C. difficile testing and GI pathology panel ordered.  Patient is following as an outpatient with Dr. Benson Norway with gastroenterology and according to the patient her diarrhea is felt to be related to her Crohn's disease.  Patient is to initiate Remicade infusions on Monday   eventually discharging patient to follow-up as an outpatient on Monday and initiate Remicade infusions.  Will attempt a trial of as needed loperamide as well if patient's diarrheal symptoms recur.  Will obtain GI consultation if patient does not clinically improve.    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  Hydrating patient with intravenous isotonic fluids  Initiating clear liquid diet and advancing as tolerated, encouraging oral intake.  Nutrition evaluation  Physical therapy evaluation    Lactic acidosis  Patient presenting with substantial lactic acidosis of 3.4  This is likely primarily secondary to severe volume depletion and not secondary to underlying infection  Hydrating patient with intravenous isotonic fluids  Performing serial lactic acid levels to ensure downtrending and resolution    Crohn's disease of colon with complication [facility])   Patient has longstanding history of Crohn's disease, diagnosed in 1994  Patient now follows with  Dr. Benson Norway with gastroenterology  Patient has been suffering from frequent bouts of diarrhea for at least the past several months.  According to the patient, she is to be initiated on Remicade infusion starting Monday.  We will attempt to hydrate patient and achieve euvolemia as well as resolution of patient's acute kidney injury and lactic acidosis.  If patient is not clinically improving will obtain gastroenterology consultation.    Prolonged Q-T interval on ECG   Monitoring patient on telemetry  Avoiding QT prolonging agents    Severe protein-calorie malnutrition ([place])   Patient reports longstanding history of poor oral intake with unintentional weight loss  Initiated patient on clear liquid diet and advancing as tolerated.  Obtaining nutrition consultation  Encouraging oral intake    Coronary artery disease involving native coronary artery of native heart without angina pectoris  . Patient is currently chest pain free . Monitoring patient on telemetry . Continue home regimen of Brilinta  . Temporarily holding metoprolol due to  hypotension.    Essential hypertension   Temporarily holding home regimen of metoprolol due to hypotension.    Abnormal LFTs   Notable elevation of AST and ALT  Per review of previous hospitalization in May, patient was suffering from suspicion of myositis at that time with elevated transaminases and elevated creatine kinase  Patient underwent biopsy of thigh muscle at that time in late April/May which did not reveal extremely compelling evidence of myositis however this was still felt to be present.  While patient's weakness may simply be secondary to dehydration, will obtain repeat creatine kinase level  Holding home regimen of statin therapy    Mixed hyperlipidemia    Holding home regimen of statin therapy   Code Status:  Full code Family Communication: deferred   Status is: Observation  The patient remains OBS appropriate and will d/c before 2 midnights.  Dispo: The patient is from: Home              Anticipated d/c is to: Home              Anticipated d/c date is: 2 days              Patient currently is not medically stable to d/c.        Vernelle Emerald MD [facility] Pager [phone]  If 7PM-7AM, please contact night-coverage [email or web address] Use universal [place] password for that web site. If you do not have the password, please call the hospital operator.  02/02/2020, 2:52 AM

## 2020-02-02 NOTE — Progress Notes (Signed)
Initial Nutrition Assessment  DOCUMENTATION CODES:   Not applicable  INTERVENTION:  Provide Boost Breeze po TID, each supplement provides 250 kcal and 9 grams of protein.  Encourage adequate PO intake.   NUTRITION DIAGNOSIS:   Inadequate oral intake related to altered GI function as evidenced by  (clear liquid diet).  GOAL:   Patient will meet greater than or equal to 90% of their needs  MONITOR:   PO intake, Supplement acceptance, Diet advancement, Skin, Weight trends, Labs, I & O's  REASON FOR ASSESSMENT:   Consult Assessment of nutrition requirement/status  ASSESSMENT:   64 year old female with past medical history of Crohn's disease, CAD, avascular necrosis of the right hip who presents with worsening generalized weakness and diarrhea.CT imaging of the abdomen reveals diarrhea throughout the colon.    Pt unavailable during attempted time of contact. RD unable to obtain pt nutrition history at this time. Pt is currently on a clear liquid diet. Per weight records, pt with a 12% weight loss in 5 months, significant for time frame. RD to order nutritional supplements to aid in caloric and protein needs.   Unable to complete Nutrition-Focused physical exam at this time.   Labs and medications reviewed.   Diet Order:   Diet Order            Diet clear liquid Room service appropriate? Yes; Fluid consistency: Thin  Diet effective now                 EDUCATION NEEDS:   Not appropriate for education at this time  Skin:  Skin Assessment: Reviewed RN Assessment  Last BM:  Unknown  Height:   Ht Readings from Last 1 Encounters:  02/01/20 5' 9"  (1.753 m)    Weight:   Wt Readings from Last 1 Encounters:  02/01/20 59.9 kg   BMI:  Body mass index is 19.49 kg/m.  Estimated Nutritional Needs:   Kcal:  1650-1850  Protein:  80-95 grams  Fluid:  >/= 1.7 L/day  Corrin Parker, MS, RD, LDN RD pager number/after hours weekend pager number on Amion.

## 2020-02-02 NOTE — Evaluation (Signed)
Physical Therapy Evaluation Patient Details Name: Anne [name] MRN: [id number] DOB: [birth year] Today's Date: 02/02/2020   History of Present Illness  64 yo female with onset of weakness and worsening diarrhea was admitted for care.  Has been having symptoms for months, but worse now.  R lower abd pain, AKI, hypotensive, dehydrated.  PMHx:  CAD, STEMI, Crohn's, avascular necrosis of R hip, atherosclerosis, kidney stones,   Clinical Impression  Pt was seen for gait training in her room, pushing the IV pole but not depending on it.  Pt is expecting to get home soon, has help with her significant other and will be able to recover with follow up with PCP.  Her rehab needs are to encourage gait and time OOB while in hosp and should not need follow up PT.  See for goals of PT, including stairs if time allows.     Follow Up Recommendations No PT follow up    Equipment Recommendations  None recommended by PT    Recommendations for Other Services       Precautions / Restrictions Precautions Precautions: Fall Precaution Comments: enteric precautions Restrictions Weight Bearing Restrictions: No      Mobility  Bed Mobility Overal bed mobility: Modified Independent                  Transfers Overall transfer level: Needs assistance Equipment used: Rolling walker (2 wheeled) Transfers: Sit to/from Stand Sit to Stand: Supervision (for safety)         General transfer comment: pt uses safe technique  Ambulation/Gait Ambulation/Gait assistance: Supervision Gait Distance (Feet): 100 Feet Assistive device: Rolling walker (2 wheeled);1 person hand held assist Gait Pattern/deviations: Step-through pattern;Decreased stride length;Narrow base of support Gait velocity: reduced Gait velocity interpretation: <1.31 ft/sec, indicative of household ambulator General Gait Details: walked in her room for mult trips around the room  Stairs            Wheelchair Mobility     Modified Rankin (Stroke Patients Only)       Balance Overall balance assessment: Modified Independent                                           Pertinent Vitals/Pain Pain Assessment: No/denies pain    Home Living Family/patient expects to be discharged to:: Private residence Living Arrangements: Spouse/significant other Available Help at Discharge: Family Type of Home: House Home Access: Stairs to enter Entrance Stairs-Rails: Can reach both Entrance Stairs-Number of Steps: 3 Home Layout: One level Home Equipment: None      Prior Function Level of Independence: Independent         Comments: takes her dog for walks     Hand Dominance   Dominant Hand: Right    Extremity/Trunk Assessment   Upper Extremity Assessment Upper Extremity Assessment: Overall WFL for tasks assessed    Lower Extremity Assessment Lower Extremity Assessment: Overall WFL for tasks assessed    Cervical / Trunk Assessment Cervical / Trunk Assessment: Normal  Communication   Communication: No difficulties  Cognition Arousal/Alertness: Awake/alert Behavior During Therapy: WFL for tasks assessed/performed Overall Cognitive Status: Within Functional Limits for tasks assessed                                        General Comments  General comments (skin integrity, edema, etc.): Pt was admitted with dizziness and hypotension, but not having any symptoms today.      Exercises     Assessment/Plan    PT Assessment Patient needs continued PT services  PT Problem List Decreased activity tolerance;Decreased mobility;Pain       PT Treatment Interventions DME instruction;Gait training;Stair training;Functional mobility training;Therapeutic activities;Therapeutic exercise;Balance training;Neuromuscular re-education;Patient/family education    PT Goals (Current goals can be found in the Care Plan section)  Acute Rehab PT Goals Patient Stated Goal: to get  home to her dog PT Goal Formulation: With patient Time For Goal Achievement: 02/09/20 Potential to Achieve Goals: Good    Frequency Min 3X/week   Barriers to discharge   has significant other who will be home with her    Co-evaluation               AM-PAC PT "6 Clicks" Mobility  Outcome Measure Help needed turning from your back to your side while in a flat bed without using bedrails?: None Help needed moving from lying on your back to sitting on the side of a flat bed without using bedrails?: None Help needed moving to and from a bed to a chair (including a wheelchair)?: A Little Help needed standing up from a chair using your arms (e.g., wheelchair or bedside chair)?: A Little Help needed to walk in hospital room?: A Little Help needed climbing 3-5 steps with a railing? : A Little 6 Click Score: 20    End of Session Equipment Utilized During Treatment: Gait belt Activity Tolerance: Treatment limited secondary to medical complications (Comment) Patient left: in bed;with call bell/phone within reach Nurse Communication: Mobility status PT Visit Diagnosis: Pain Pain - Right/Left:  (abdomen) Pain - part of body:  (abdoemn)    Time: 0100-7121 PT Time Calculation (min) (ACUTE ONLY): 17 min   Charges:   PT Evaluation $PT Eval Moderate Complexity: 1 Mod         Ramond Dial 02/02/2020, 5:37 PM  Mee Hives, PT MS Acute Rehab Dept. Number: [place]

## 2020-02-03 DIAGNOSIS — N179 Acute kidney failure, unspecified: Secondary | ICD-10-CM | POA: Diagnosis not present

## 2020-02-03 LAB — URINE CULTURE

## 2020-02-03 LAB — COMPREHENSIVE METABOLIC PANEL
ALT: 69 U/L — ABNORMAL HIGH (ref 0–44)
AST: 47 U/L — ABNORMAL HIGH (ref 15–41)
Albumin: 2.8 g/dL — ABNORMAL LOW (ref 3.5–5.0)
Alkaline Phosphatase: 80 U/L (ref 38–126)
Anion gap: 10 (ref 5–15)
BUN: 15 mg/dL (ref 8–23)
CO2: 21 mmol/L — ABNORMAL LOW (ref 22–32)
Calcium: 8.2 mg/dL — ABNORMAL LOW (ref 8.9–10.3)
Chloride: 106 mmol/L (ref 98–111)
Creatinine, Ser: 1.31 mg/dL — ABNORMAL HIGH (ref 0.44–1.00)
GFR, Estimated: 45 mL/min — ABNORMAL LOW (ref >60)
Glucose, Bld: 77 mg/dL (ref 70–99)
Potassium: 3.8 mmol/L (ref 3.5–5.1)
Sodium: 137 mmol/L (ref 135–145)
Total Bilirubin: 0.7 mg/dL (ref 0.3–1.2)
Total Protein: 5.9 g/dL — ABNORMAL LOW (ref 6.5–8.1)

## 2020-02-03 LAB — C DIFFICILE QUICK SCREEN W PCR REFLEX
C Diff antigen: NEGATIVE
C Diff interpretation: NOT DETECTED
C Diff toxin: NEGATIVE

## 2020-02-03 LAB — CBC
HCT: 35.5 % — ABNORMAL LOW (ref 36.0–46.0)
Hemoglobin: 11.8 g/dL — ABNORMAL LOW (ref 12.0–15.0)
MCH: 31.2 pg (ref 26.0–34.0)
MCHC: 33.2 g/dL (ref 30.0–36.0)
MCV: 93.9 fL (ref 80.0–100.0)
Platelets: 292 10*3/uL (ref 150–400)
RBC: 3.78 MIL/uL — ABNORMAL LOW (ref 3.87–5.11)
RDW: 15.8 % — ABNORMAL HIGH (ref 11.5–15.5)
WBC: 5.4 10*3/uL (ref 4.0–10.5)
nRBC: 0 % (ref 0.0–0.2)

## 2020-02-03 LAB — CORTISOL: Cortisol, Plasma: 15.7 ug/dL

## 2020-02-03 MED ORDER — LORAZEPAM 2 MG/ML IJ SOLN
INTRAMUSCULAR | Status: AC
  Filled 2020-02-03: qty 1

## 2020-02-03 MED ORDER — ETOMIDATE 2 MG/ML IV SOLN
INTRAVENOUS | Status: AC
  Filled 2020-02-03: qty 20

## 2020-02-03 MED ORDER — ROCURONIUM BROMIDE 10 MG/ML (PF) SYRINGE
PREFILLED_SYRINGE | INTRAVENOUS | Status: AC
  Filled 2020-02-03: qty 10

## 2020-02-03 MED ORDER — MIDAZOLAM HCL 2 MG/2ML IJ SOLN
INTRAMUSCULAR | Status: AC
  Filled 2020-02-03: qty 4

## 2020-02-03 MED ORDER — ENOXAPARIN SODIUM 40 MG/0.4ML ~~LOC~~ SOLN
40.0000 mg | Freq: Every day | SUBCUTANEOUS | Status: DC
  Administered 2020-02-04 – 2020-02-06 (×3): 40 mg via SUBCUTANEOUS
  Filled 2020-02-03 (×3): qty 0.4

## 2020-02-03 MED ORDER — MIDODRINE HCL 5 MG PO TABS
2.5000 mg | ORAL_TABLET | Freq: Three times a day (TID) | ORAL | Status: DC
  Administered 2020-02-03 – 2020-02-04 (×3): 2.5 mg via ORAL
  Filled 2020-02-03 (×3): qty 1

## 2020-02-03 MED ORDER — FENTANYL CITRATE (PF) 100 MCG/2ML IJ SOLN
INTRAMUSCULAR | Status: AC
  Filled 2020-02-03: qty 2

## 2020-02-03 NOTE — Progress Notes (Signed)
Progress Note    Anne [name]  KWI:[id number] DOB: 1955-07-26  DOA: 02/01/2020 PCP: Jodi Marble, MD    Brief Narrative:     Medical records reviewed and are as summarized below:  Anne [name] is an 64 y.o. female with past medical history of Crohn's disease (Dx'ed 1994, follows with Dr. Laury Axon artery disease (S/P STEMI 2019 with DES to RCA), avascular necrosis of the right hip who presents to [facility] emergency department with complaints of progressively worsening generalized weakness and diarrhea.  Patient explains that she has been suffering from waxing and waning loose stools and diarrhea for at least the past several months now.  In the past 2 weeks, patient has been experiencing increasing intensity of her diarrhea, experiencing several episodes daily.  Diarrhea is nonbloody, occasionally loose and occasionally watery.  Patient is complaining of associated abdominal cramping.  Abdominal cramping is waxing and waning in quality, severe in intensity, located in the right side of the abdomen and nonradiating.  Over the past 2 weeks of the patient's symptoms have persisted patient has begun to develop associated generalized weakness and poor appetite.  In the past several days patient has been experiencing severe lightheadedness as well, particularly with rising from a seated position.  Assessment/Plan:   Principal Problem:   AKI (acute kidney injury) ([place]) Active Problems:   Generalized weakness   Prolonged Q-T interval on ECG   Severe protein-calorie malnutrition (HCC)   Lactic acidosis   Crohn's disease of colon with complication (HCC)   Chronic diarrhea   Coronary artery disease involving native coronary artery of native heart without angina pectoris   Mixed hyperlipidemia   Essential hypertension   Abnormal LFTs   Acute diarrhea    AKI (acute kidney injury) ([place]) -Patient suffering from acute kidney injury, likely prerenal in origin  from excessive volume depletion -Hydrating patient  -daily labs, slowly improving     Chronic diarrhea due to Crohn's -Patient reports at least a several month history of ongoing diarrhea, now seemingly chronic -currently resolved -C. difficile testing and GI pathology panel ordered but stool sample never sent -Patient is following as an outpatient with Dr. Benson Norway with gastroenterology and according to the patient her diarrhea is felt to be related to her Crohn's disease.    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 -Hydrating patient with intravenous isotonic fluids -did well with PT    Lactic acidosis -Patient presenting with substantial lactic acidosis of 3.4 -This is likely primarily secondary to severe volume depletion and not secondary to underlying infection -Hydrating patient with intravenous isotonic fluids     Crohn's disease of colon with complication [facility]) -Patient has longstanding history of Crohn's disease, diagnosed in 1994 -Patient now follows with Dr. Benson Norway with gastroenterology -Patient has been suffering from frequent bouts of diarrhea for at least the past several months.  According to the patient, she is to be initiated on Remicade infusion starting Monday. -diarrhea improved so no need for GI consult yet    Prolonged Q-T interval on ECG -Monitoring patient on telemetry -Avoiding QT prolonging agents    Severe protein-calorie malnutrition ([place]) -Patient reports longstanding history of poor oral intake with unintentional weight loss -advancing diet    Coronary artery disease involving native coronary artery of native heart without angina pectoris -chest pain free -Monitoring patient on  telemetry -Continue home regimen of Brilinta  -Temporarily holding metoprolol due to  hypotension.    Essential hypertension -Temporarily holding home regimen of metoprolol due to hypotension. -trial of midodrine as still hypotensive -cortisol normal    Abnormal LFTs -appears chronic -defer to outpatient -Holding home regimen of statin therapy    Mixed hyperlipidemia  -Holding home regimen of statin therapy   Family Communication/Anticipated D/C date and plan/Code Status   DVT prophylaxis: Lovenox ordered. Code Status: Full Code.  Disposition Plan: Status is: Inpatient  Remains inpatient appropriate because:IV treatments appropriate due to intensity of illness or inability to take PO   Dispo: The patient is from: Home              Anticipated d/c is to: Home              Anticipated d/c date is: 1 day- 2 days              Patient currently is not medically stable to d/c.         Medical Consultants:    None.    Subjective:   No further diarrhea episodes Tolerating advanced diet  Objective:    Vitals:   02/02/20 1744 02/03/20 0153 02/03/20 0520 02/03/20 1240  BP: 100/66 95/71 (!) 84/59 (!) 92/59  Pulse: 78 76 76 78  Resp: 15 20 16 16   Temp: 97.9 F (36.6 C) 97.8 F (36.6 C) 98.5 F (36.9 C) 98.8 F (37.1 C)  TempSrc: Oral Oral Oral Oral  SpO2: 92% 96% 98% 100%  Weight:      Height:        Intake/Output Summary (Last 24 hours) at 02/03/2020 1249 Last data filed at 02/03/2020 1248 Gross per 24 hour  Intake 4057.28 ml  Output --  Net 4057.28 ml   Filed Weights   02/01/20 1610 02/01/20 1613  Weight: 60.3 kg 59.9 kg    Exam:  General: Appearance:    Thin female in no acute distress   +BS, soft, NT  Lungs:     , respirations unlabored  Heart:    Normal heart rate. Normal rhythm. No murmurs, rubs, or gallops.   MS:   All extremities are intact.   Neurologic:   Awake, alert, oriented x 3. No apparent focal neurological           defect.     Data Reviewed:   I have personally reviewed following labs and imaging  studies:  Labs: Labs show the following:   Basic Metabolic Panel: Recent Labs  Lab 02/01/20 1625 02/01/20 1625 02/01/20 1750 02/02/20 0554 02/03/20 0113  NA 138  --   --  139 137  K 3.6   < >  --  3.1* 3.8  CL 105  --   --  105 106  CO2 19*  --   --  21* 21*  GLUCOSE 146*  --   --  106* 77  BUN 24*  --   --  22 15  CREATININE 2.13*  --   --  1.63* 1.31*  CALCIUM 10.3  --   --  9.3 8.2*  MG  --   --  2.1 1.6*  --   PHOS  --   --   --  3.0  --    < > = values in this interval not displayed.   GFR Estimated Creatinine Clearance: 39.9 mL/min (A) (by C-G formula based on SCr of 1.31 mg/dL (H)). Liver Function Tests: Recent  Labs  Lab 02/01/20 1625 02/02/20 0554 02/03/20 0113  AST 148* 94* 47*  ALT 144* 104* 69*  ALKPHOS 136* 99 80  BILITOT 0.8 1.0 0.7  PROT 8.9* 6.8 5.9*  ALBUMIN 4.1 3.1* 2.8*   Recent Labs  Lab 02/01/20 1625  LIPASE 50   No results for input(s): AMMONIA in the last 168 hours. Coagulation profile Recent Labs  Lab 02/01/20 1750  INR 1.1    CBC: Recent Labs  Lab 02/01/20 1625 02/02/20 0554 02/03/20 0113  WBC 5.2 7.5 5.4  NEUTROABS  --  4.4  --   HGB 16.1* 13.7 11.8*  HCT 49.1* 39.7 35.5*  MCV 93.2 90.2 93.9  PLT 417* 300 292   Cardiac Enzymes: Recent Labs  Lab 02/02/20 0554  CKTOTAL 91   BNP (last 3 results) No results for input(s): PROBNP in the last 8760 hours. CBG: No results for input(s): GLUCAP in the last 168 hours. D-Dimer: No results for input(s): DDIMER in the last 72 hours. Hgb A1c: No results for input(s): HGBA1C in the last 72 hours. Lipid Profile: No results for input(s): CHOL, HDL, LDLCALC, TRIG, CHOLHDL, LDLDIRECT in the last 72 hours. Thyroid function studies: No results for input(s): TSH, T4TOTAL, T3FREE, THYROIDAB in the last 72 hours.  Invalid input(s): FREET3 Anemia work up: No results for input(s): VITAMINB12, FOLATE, FERRITIN, TIBC, IRON, RETICCTPCT in the last 72 hours. Sepsis Labs: Recent Labs    Lab 02/01/20 1625 02/01/20 1755 02/01/20 2033 02/02/20 0554 02/03/20 0113  WBC 5.2  --   --  7.5 5.4  LATICACIDVEN  --  3.4* 1.8  --   --     Microbiology Recent Results (from the past 240 hour(s))  Blood Culture (routine x 2)     Status: None (Preliminary result)   Collection Time: 02/01/20  5:24 PM   Specimen: BLOOD  Result Value Ref Range Status   Specimen Description BLOOD RIGHT ARM  Final   Special Requests   Final    BOTTLES DRAWN AEROBIC AND ANAEROBIC Blood Culture adequate volume   Culture   Final    NO GROWTH 2 DAYS Performed at [facility], [address], [place], [place] [address]    Report Status PENDING  Incomplete  Blood Culture (routine x 2)     Status: None (Preliminary result)   Collection Time: 02/01/20  5:58 PM   Specimen: BLOOD  Result Value Ref Range Status   Specimen Description BLOOD RIGHT WRIST  Final   Special Requests   Final    BOTTLES DRAWN AEROBIC AND ANAEROBIC Blood Culture results may not be optimal due to an inadequate volume of blood received in culture bottles   Culture   Final    NO GROWTH 2 DAYS Performed at [facility], [place] [address], [place], [place] [address]    Report Status PENDING  Incomplete  Urine culture     Status: Abnormal   Collection Time: 02/01/20  8:40 PM   Specimen: In/Out Cath Urine  Result Value Ref Range Status   Specimen Description IN/OUT CATH URINE  Final   Special Requests   Final    NONE Performed at [facility], [place] [address], [place], [place] [address]    Culture MULTIPLE SPECIES PRESENT, SUGGEST RECOLLECTION (A)  Final   Report Status 02/03/2020 FINAL  Final  Respiratory Panel by RT PCR (Flu A&B, Covid) - Nasopharyngeal Swab     Status: None   Collection Time: 02/01/20  9:10 PM   Specimen: Nasopharyngeal Swab  Result Value Ref Range Status   SARS Coronavirus 2 by RT PCR NEGATIVE NEGATIVE Final    Comment: (NOTE) SARS-CoV-2 target nucleic acids are NOT DETECTED.  The  SARS-CoV-2 RNA is generally detectable in upper respiratoy specimens during the acute phase of infection. The lowest concentration of SARS-CoV-2 viral copies this assay can detect is 131 copies/mL. A negative result does not preclude SARS-Cov-2 infection and should not be used as the sole basis for treatment or other patient management decisions. A negative result may occur with  improper specimen collection/handling, submission of specimen other than nasopharyngeal swab, presence of viral mutation(s) within the areas targeted by this assay, and inadequate number of viral copies (<131 copies/mL). A negative result must be combined with clinical observations, patient history, and epidemiological information. The expected result is Negative.  Fact Sheet for Patients:  PinkCheek.be  Fact Sheet for Healthcare Providers:  GravelBags.it  This test is no t yet approved or cleared by the [place] FDA and  has been authorized for detection and/or diagnosis of SARS-CoV-2 by FDA under an Emergency Use Authorization (EUA). This EUA will remain  in effect (meaning this test can be used) for the duration of the COVID-19 declaration under Section 564(b)(1) of the Act, 21 U.S.C. section 360bbb-3(b)(1), unless the authorization is terminated or revoked sooner.     Influenza A by PCR NEGATIVE NEGATIVE Final   Influenza B by PCR NEGATIVE NEGATIVE Final    Comment: (NOTE) The Xpert Xpress SARS-CoV-2/FLU/RSV assay is intended as an aid in  the diagnosis of influenza from Nasopharyngeal swab specimens and  should not be used as a sole basis for treatment. Nasal washings and  aspirates are unacceptable for Xpert Xpress SARS-CoV-2/FLU/RSV  testing.  Fact Sheet for Patients: PinkCheek.be  Fact Sheet for Healthcare Providers: GravelBags.it  This test is not yet approved or cleared  by the [place] FDA and  has been authorized for detection and/or diagnosis of SARS-CoV-2 by  FDA under an Emergency Use Authorization (EUA). This EUA will remain  in effect (meaning this test can be used) for the duration of the  Covid-19 declaration under Section 564(b)(1) of the Act, 21  U.S.C. section 360bbb-3(b)(1), unless the authorization is  terminated or revoked. Performed at [facility], [place] [address] [place], [place] [address]     Procedures and diagnostic studies:  CT Abdomen Pelvis Wo Contrast  Result Date: 02/01/2020 CLINICAL DATA:  64 year old female with abdominal pain. EXAM: CT ABDOMEN AND PELVIS WITHOUT CONTRAST TECHNIQUE: Multidetector CT imaging of the abdomen and pelvis was performed following the standard protocol without IV contrast. COMPARISON:  Abdominal MRI dated 08/07/2019. FINDINGS: Evaluation of this exam is limited in the absence of intravenous contrast. Lower chest: The visualized lung bases are clear. There is coronary vascular calcification. No intra-abdominal free air or free fluid. Hepatobiliary: Diffuse fatty liver. No intrahepatic biliary ductal dilatation. Cholecystectomy. No retained calcified stone noted in the central CBD. Pancreas: Unremarkable. No pancreatic ductal dilatation or surrounding inflammatory changes. Spleen: Normal in size without focal abnormality. Adrenals/Urinary Tract: The adrenal glands unremarkable. There is a 3 mm nonobstructing right renal inferior pole calculus. Additional punctate stone noted in the interpolar right kidney. No hydronephrosis. A 3.5 cm right renal upper pole cyst. There is no hydronephrosis or nephrolithiasis on the left. The visualized ureters and urinary bladder appear unremarkable. Stomach/Bowel: There are postsurgical changes of the bowel with anastomotic suture in the right lower quadrant.  There is an 11 x 18 mm focal area of nodularity adjacent to the suture which is not well evaluated but may  represent scarring (coronal 43/6). There is mild diffuse dilatation of small-bowel loops measuring up to 3.1 cm. No definite transition identified. Oral contrast is noted in the proximal colon. Liquid stool noted throughout the colon compatible with diarrheal state. Vascular/Lymphatic: Mild aortoiliac atherosclerotic disease. The IVC is unremarkable. No portal venous gas. There is no adenopathy. Reproductive: The uterus is retroflexed.  No adnexal masses. Other: None Musculoskeletal: Degenerative changes of the lower lumbar spine. No acute osseous pathology. Avascular necrosis of the right femoral head. No acute fracture or cortical collapse. IMPRESSION: 1. Mildly dilated small bowel loops without transition may represent ileus. Clinical correlation is recommended. 2. Diarrheal state. Correlation with clinical exam and stool cultures recommended. 3. Fatty liver. 4. Nonobstructing right renal calculi. No hydronephrosis. 5. Aortic Atherosclerosis (ICD10-I70.0). Electronically Signed   By: Anner Crete M.D.   On: 02/01/2020 21:49   DG Chest Port 1 View  Result Date: 02/01/2020 CLINICAL DATA:  Weakness and dizziness EXAM: PORTABLE CHEST 1 VIEW COMPARISON:  08/04/2019 FINDINGS: The heart size and mediastinal contours are within normal limits. Both lungs are clear. The visualized skeletal structures are unremarkable. Postsurgical changes are seen in the cervical spine. IMPRESSION: No active disease. Electronically Signed   By: Inez Catalina M.D.   On: 02/01/2020 18:10    Medications:   . cholestyramine  4 g Oral Q12H  . cyclobenzaprine  10 mg Oral QHS  . [START ON 02/04/2020] enoxaparin (LOVENOX) injection  40 mg Subcutaneous Daily  . etomidate      . feeding supplement  1 Container Oral TID BM  . fentaNYL      . LORazepam      . midazolam      . midodrine  2.5 mg Oral TID WC  . rocuronium bromide      . sodium bicarbonate  1,950 mg Oral BID  . ticagrelor  60 mg Oral BID   Continuous  Infusions: . lactated ringers 75 mL/hr at 02/02/20 2353     LOS: 1 day   Geradine Girt  [facility]   How to contact the [facility] Attending or Consulting provider [place] or covering provider during after hours [place], for this patient?  1. Check the care team in [facility] and look for a) attending/consulting [facility] provider listed and b) the [facility] team listed 2. Log into [email or web address] and use [place]'s universal password to access. If you do not have the password, please contact the hospital operator. 3. Locate the [facility] provider you are looking for under [facility] and page to a number that you can be directly reached. 4. If you still have difficulty reaching the provider, please page the [facility] (Director on Call) for the Hospitalists listed on amion for assistance.  02/03/2020, 12:49 PM

## 2020-02-04 DIAGNOSIS — N179 Acute kidney failure, unspecified: Secondary | ICD-10-CM | POA: Diagnosis not present

## 2020-02-04 LAB — CBC
HCT: 36.3 % (ref 36.0–46.0)
Hemoglobin: 12.1 g/dL (ref 12.0–15.0)
MCH: 30.7 pg (ref 26.0–34.0)
MCHC: 33.3 g/dL (ref 30.0–36.0)
MCV: 92.1 fL (ref 80.0–100.0)
Platelets: 293 10*3/uL (ref 150–400)
RBC: 3.94 MIL/uL (ref 3.87–5.11)
RDW: 15.6 % — ABNORMAL HIGH (ref 11.5–15.5)
WBC: 6.6 10*3/uL (ref 4.0–10.5)
nRBC: 0 % (ref 0.0–0.2)

## 2020-02-04 LAB — BASIC METABOLIC PANEL
Anion gap: 9 (ref 5–15)
BUN: 12 mg/dL (ref 8–23)
CO2: 23 mmol/L (ref 22–32)
Calcium: 8.5 mg/dL — ABNORMAL LOW (ref 8.9–10.3)
Chloride: 106 mmol/L (ref 98–111)
Creatinine, Ser: 1.15 mg/dL — ABNORMAL HIGH (ref 0.44–1.00)
GFR, Estimated: 53 mL/min — ABNORMAL LOW (ref >60)
Glucose, Bld: 90 mg/dL (ref 70–99)
Potassium: 3.8 mmol/L (ref 3.5–5.1)
Sodium: 138 mmol/L (ref 135–145)

## 2020-02-04 LAB — GASTROINTESTINAL PANEL BY PCR, STOOL (REPLACES STOOL CULTURE)

## 2020-02-04 LAB — MAGNESIUM: Magnesium: 1.9 mg/dL (ref 1.7–2.4)

## 2020-02-04 MED ORDER — MIDODRINE HCL 5 MG PO TABS
5.0000 mg | ORAL_TABLET | Freq: Three times a day (TID) | ORAL | Status: DC
  Administered 2020-02-04 – 2020-02-06 (×6): 5 mg via ORAL
  Filled 2020-02-04 (×6): qty 1

## 2020-02-04 NOTE — Progress Notes (Signed)
Progress Note    Anne [name]  HQP:[id number] DOB: March 28, 1956  DOA: 02/01/2020 PCP: Jodi Marble, MD    Brief Narrative:     Medical records reviewed and are as summarized below:  Anne [name] is an 64 y.o. female with past medical history of Crohn's disease (Dx'ed 1994, follows with Dr. Laury Axon artery disease (S/P STEMI 2019 with DES to RCA), avascular necrosis of the right hip who presents to [facility] emergency department with complaints of progressively worsening generalized weakness and diarrhea.  Patient explains that she has been suffering from waxing and waning loose stools and diarrhea for at least the past several months now.  In the past 2 weeks, patient has been experiencing increasing intensity of her diarrhea, experiencing several episodes daily.  Diarrhea is nonbloody, occasionally loose and occasionally watery.  Patient is complaining of associated abdominal cramping.  Abdominal cramping is waxing and waning in quality, severe in intensity, located in the right side of the abdomen and nonradiating.  Over the past 2 weeks of the patient's symptoms have persisted patient has begun to develop associated generalized weakness and poor appetite.  In the past several days patient has been experiencing severe lightheadedness as well, particularly with rising from a seated position.  Assessment/Plan:   Principal Problem:   AKI (acute kidney injury) ([place]) Active Problems:   Generalized weakness   Prolonged Q-T interval on ECG   Severe protein-calorie malnutrition (HCC)   Lactic acidosis   Crohn's disease of colon with complication (HCC)   Chronic diarrhea   Coronary artery disease involving native coronary artery of native heart without angina pectoris   Mixed hyperlipidemia   Essential hypertension   Abnormal LFTs   Acute diarrhea    AKI (acute kidney injury) ([place]) -Patient suffering from acute kidney injury, likely prerenal in origin  from excessive volume depletion -Hydrating patient  -daily labs, slowly improving     Chronic diarrhea due to Crohn's -Patient reports at least a several month history of ongoing diarrhea, now seemingly chronic -currently resolved -C. difficile testing neagtive -Patient is following as an outpatient with Dr. Benson Norway with gastroenterology and according to the patient her diarrhea is felt to be related to her Crohn's disease.    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 -Hydrating patient with intravenous isotonic fluids -did well with PT    Lactic acidosis -resolved  Symptomatic hypotension -midodrine -TED hose    Crohn's disease of colon with complication [facility]) -Patient has longstanding history of Crohn's disease, diagnosed in 1994 -Patient now follows with Dr. Benson Norway with gastroenterology -Patient has been suffering from frequent bouts of diarrhea for at least the past several months.  According to the patient, she is to be initiated on Remicade infusion starting Monday- this will need to be postponed -diarrhea improved so no need for GI consult yet    Prolonged Q-T interval on ECG -Monitoring patient on telemetry -Avoiding QT prolonging agents    Severe protein-calorie malnutrition ([place]) -Patient reports longstanding history of poor oral intake with unintentional weight loss (around 60 lbs) -advancing diet    Coronary artery disease involving native coronary artery of native heart without angina pectoris -chest pain free -Monitoring patient on telemetry -Continue home regimen of Brilinta  -Temporarily holding metoprolol due to hypotension.    Essential hypertension -Temporarily holding home regimen of  metoprolol due to hypotension. -trial of midodrine as still  hypotensive -cortisol normal    Abnormal LFTs -appears chronic -defer to outpatient -Holding home regimen of statin therapy    Mixed hyperlipidemia  -Holding home regimen of statin therapy   Family Communication/Anticipated D/C date and plan/Code Status   DVT prophylaxis: Lovenox ordered. Code Status: Full Code.  Disposition Plan: Status is: Inpatient  Remains inpatient appropriate because:IV treatments appropriate due to intensity of illness or inability to take PO   Dispo: The patient is from: Home              Anticipated d/c is to: Home              Anticipated d/c date is: 1 day- 2 days              Patient currently is not medically stable to d/c.         Medical Consultants:    None.    Subjective:   Got dizzy earlier when getting up  Objective:    Vitals:   02/04/20 0020 02/04/20 0550 02/04/20 0806 02/04/20 1233  BP: 92/61 91/63 (!) 91/55 111/71  Pulse: 77 75 75 77  Resp: 16 15  16   Temp: 98.1 F (36.7 C) 97.8 F (36.6 C)  97.9 F (36.6 C)  TempSrc: Oral Oral  Oral  SpO2: 100% 97%  100%  Weight:      Height:        Intake/Output Summary (Last 24 hours) at 02/04/2020 1418 Last data filed at 02/04/2020 1205 Gross per 24 hour  Intake 2296.53 ml  Output --  Net 2296.53 ml   Filed Weights   02/01/20 1610 02/01/20 1613  Weight: 60.3 kg 59.9 kg    Exam:  General: Appearance:    Thin female in no acute distress     Lungs:     respirations unlabored  Heart:    Normal heart rate. Normal rhythm. No murmurs, rubs, or gallops.   MS:   All extremities are intact.   Neurologic:   Awake, alert, oriented x 3. No apparent focal neurological           defect.     Data Reviewed:   I have personally reviewed following labs and imaging studies:  Labs: Labs show the following:   Basic Metabolic Panel: Recent Labs  Lab 02/01/20 1625 02/01/20 1625 02/01/20 1750 02/02/20 0554 02/02/20 0554 02/03/20 0113 02/04/20 0214  NA 138  --    --  139  --  137 138  K 3.6   < >  --  3.1*   < > 3.8 3.8  CL 105  --   --  105  --  106 106  CO2 19*  --   --  21*  --  21* 23  GLUCOSE 146*  --   --  106*  --  77 90  BUN 24*  --   --  22  --  15 12  CREATININE 2.13*  --   --  1.63*  --  1.31* 1.15*  CALCIUM 10.3  --   --  9.3  --  8.2* 8.5*  MG  --   --  2.1 1.6*  --   --  1.9  PHOS  --   --   --  3.0  --   --   --    < > = values in this interval not displayed.   GFR Estimated Creatinine Clearance: 45.5  mL/min (A) (by C-G formula based on SCr of 1.15 mg/dL (H)). Liver Function Tests: Recent Labs  Lab 02/01/20 1625 02/02/20 0554 02/03/20 0113  AST 148* 94* 47*  ALT 144* 104* 69*  ALKPHOS 136* 99 80  BILITOT 0.8 1.0 0.7  PROT 8.9* 6.8 5.9*  ALBUMIN 4.1 3.1* 2.8*   Recent Labs  Lab 02/01/20 1625  LIPASE 50   No results for input(s): AMMONIA in the last 168 hours. Coagulation profile Recent Labs  Lab 02/01/20 1750  INR 1.1    CBC: Recent Labs  Lab 02/01/20 1625 02/02/20 0554 02/03/20 0113 02/04/20 0214  WBC 5.2 7.5 5.4 6.6  NEUTROABS  --  4.4  --   --   HGB 16.1* 13.7 11.8* 12.1  HCT 49.1* 39.7 35.5* 36.3  MCV 93.2 90.2 93.9 92.1  PLT 417* 300 292 293   Cardiac Enzymes: Recent Labs  Lab 02/02/20 0554  CKTOTAL 91   BNP (last 3 results) No results for input(s): PROBNP in the last 8760 hours. CBG: No results for input(s): GLUCAP in the last 168 hours. D-Dimer: No results for input(s): DDIMER in the last 72 hours. Hgb A1c: No results for input(s): HGBA1C in the last 72 hours. Lipid Profile: No results for input(s): CHOL, HDL, LDLCALC, TRIG, CHOLHDL, LDLDIRECT in the last 72 hours. Thyroid function studies: No results for input(s): TSH, T4TOTAL, T3FREE, THYROIDAB in the last 72 hours.  Invalid input(s): FREET3 Anemia work up: No results for input(s): VITAMINB12, FOLATE, FERRITIN, TIBC, IRON, RETICCTPCT in the last 72 hours. Sepsis Labs: Recent Labs  Lab 02/01/20 1625 02/01/20 1755  02/01/20 2033 02/02/20 0554 02/03/20 0113 02/04/20 0214  WBC 5.2  --   --  7.5 5.4 6.6  LATICACIDVEN  --  3.4* 1.8  --   --   --     Microbiology Recent Results (from the past 240 hour(s))  Blood Culture (routine x 2)     Status: None (Preliminary result)   Collection Time: 02/01/20  5:24 PM   Specimen: BLOOD  Result Value Ref Range Status   Specimen Description BLOOD RIGHT ARM  Final   Special Requests   Final    BOTTLES DRAWN AEROBIC AND ANAEROBIC Blood Culture adequate volume   Culture   Final    NO GROWTH 3 DAYS Performed at [facility], [address] [place], [place] [address]    Report Status PENDING  Incomplete  Blood Culture (routine x 2)     Status: None (Preliminary result)   Collection Time: 02/01/20  5:58 PM   Specimen: BLOOD  Result Value Ref Range Status   Specimen Description BLOOD RIGHT WRIST  Final   Special Requests   Final    BOTTLES DRAWN AEROBIC AND ANAEROBIC Blood Culture results may not be optimal due to an inadequate volume of blood received in culture bottles   Culture   Final    NO GROWTH 3 DAYS Performed at [facility], [place] [address] [place], [place] [address]    Report Status PENDING  Incomplete  Urine culture     Status: Abnormal   Collection Time: 02/01/20  8:40 PM   Specimen: In/Out Cath Urine  Result Value Ref Range Status   Specimen Description IN/OUT CATH URINE  Final   Special Requests   Final    NONE Performed at [facility], [place] [address], [place], [place] [address]    Culture MULTIPLE SPECIES PRESENT, SUGGEST RECOLLECTION (A)  Final  Report Status 02/03/2020 FINAL  Final  Respiratory Panel by RT PCR (Flu A&B, Covid) - Nasopharyngeal Swab     Status: None   Collection Time: 02/01/20  9:10 PM   Specimen: Nasopharyngeal Swab  Result Value Ref Range Status   SARS Coronavirus 2 by RT PCR NEGATIVE NEGATIVE Final    Comment: (NOTE) SARS-CoV-2 target nucleic acids are NOT DETECTED.  The SARS-CoV-2 RNA is  generally detectable in upper respiratoy specimens during the acute phase of infection. The lowest concentration of SARS-CoV-2 viral copies this assay can detect is 131 copies/mL. A negative result does not preclude SARS-Cov-2 infection and should not be used as the sole basis for treatment or other patient management decisions. A negative result may occur with  improper specimen collection/handling, submission of specimen other than nasopharyngeal swab, presence of viral mutation(s) within the areas targeted by this assay, and inadequate number of viral copies (<131 copies/mL). A negative result must be combined with clinical observations, patient history, and epidemiological information. The expected result is Negative.  Fact Sheet for Patients:  PinkCheek.be  Fact Sheet for Healthcare Providers:  GravelBags.it  This test is no t yet approved or cleared by the [place] FDA and  has been authorized for detection and/or diagnosis of SARS-CoV-2 by FDA under an Emergency Use Authorization (EUA). This EUA will remain  in effect (meaning this test can be used) for the duration of the COVID-19 declaration under Section 564(b)(1) of the Act, 21 U.S.C. section 360bbb-3(b)(1), unless the authorization is terminated or revoked sooner.     Influenza A by PCR NEGATIVE NEGATIVE Final   Influenza B by PCR NEGATIVE NEGATIVE Final    Comment: (NOTE) The Xpert Xpress SARS-CoV-2/FLU/RSV assay is intended as an aid in  the diagnosis of influenza from Nasopharyngeal swab specimens and  should not be used as a sole basis for treatment. Nasal washings and  aspirates are unacceptable for Xpert Xpress SARS-CoV-2/FLU/RSV  testing.  Fact Sheet for Patients: PinkCheek.be  Fact Sheet for Healthcare Providers: GravelBags.it  This test is not yet approved or cleared by the [place] FDA and  has been authorized for detection and/or diagnosis of SARS-CoV-2 by  FDA under an Emergency Use Authorization (EUA). This EUA will remain  in effect (meaning this test can be used) for the duration of the  Covid-19 declaration under Section 564(b)(1) of the Act, 21  U.S.C. section 360bbb-3(b)(1), unless the authorization is  terminated or revoked. Performed at [facility], [place] [address] [place], [place] [address]   C Difficile Quick Screen w PCR reflex     Status: None   Collection Time: 02/03/20  7:17 PM   Specimen: STOOL  Result Value Ref Range Status   C Diff antigen NEGATIVE NEGATIVE Final   C Diff toxin NEGATIVE NEGATIVE Final   C Diff interpretation No C. difficile detected.  Final    Comment: NEGATIVE Performed at [facility], [place] [address], [place],  [address]   Gastrointestinal Panel by PCR , Stool     Status: None   Collection Time: 02/03/20  7:17 PM   Specimen: STOOL  Result Value Ref Range Status   Campylobacter species NOT DETECTED NOT DETECTED Final   Plesimonas shigelloides NOT DETECTED NOT DETECTED Final   Salmonella species NOT DETECTED NOT DETECTED Final   Yersinia enterocolitica NOT DETECTED NOT DETECTED Final   Vibrio species NOT DETECTED NOT DETECTED Final   Vibrio cholerae NOT DETECTED NOT DETECTED Final  Enteroaggregative E coli (EAEC) NOT DETECTED NOT DETECTED Final   Enteropathogenic E coli (EPEC) NOT DETECTED NOT DETECTED Final   Enterotoxigenic E coli (ETEC) NOT DETECTED NOT DETECTED Final   Shiga like toxin producing E coli (STEC) NOT DETECTED NOT DETECTED Final   Shigella/Enteroinvasive E coli (EIEC) NOT DETECTED NOT DETECTED Final   Cryptosporidium NOT DETECTED NOT DETECTED Final   Cyclospora cayetanensis NOT DETECTED NOT DETECTED Final   Entamoeba histolytica NOT DETECTED NOT DETECTED Final   Giardia lamblia NOT DETECTED NOT DETECTED Final   Adenovirus F40/41 NOT DETECTED NOT DETECTED Final   Astrovirus  NOT DETECTED NOT DETECTED Final   Norovirus GI/GII NOT DETECTED NOT DETECTED Final   Rotavirus A NOT DETECTED NOT DETECTED Final   Sapovirus (I, II, IV, and V) NOT DETECTED NOT DETECTED Final    Comment: Performed at [facility], [place] [place], [place] [address]    Procedures and diagnostic studies:  No results found.  Medications:   . cholestyramine  4 g Oral Q12H  . cyclobenzaprine  10 mg Oral QHS  . enoxaparin (LOVENOX) injection  40 mg Subcutaneous Daily  . feeding supplement  1 Container Oral TID BM  . midodrine  5 mg Oral TID WC  . sodium bicarbonate  1,950 mg Oral BID  . ticagrelor  60 mg Oral BID   Continuous Infusions: . lactated ringers 75 mL/hr at 02/04/20 0654     LOS: 2 days   Geradine Girt  [facility]   How to contact the [facility] Attending or Consulting provider [place] or covering provider during after hours [place], for this patient?  1. Check the care team in [facility] and look for a) attending/consulting [facility] provider listed and b) the [facility] team listed 2. Log into [email or web address] and use [place]'s universal password to access. If you do not have the password, please contact the hospital operator. 3. Locate the [facility] provider you are looking for under [facility] and page to a number that you can be directly reached. 4. If you still have difficulty reaching the provider, please page the [facility] (Director on Call) for the Hospitalists listed on amion for assistance.  02/04/2020, 2:18 PM

## 2020-02-05 DIAGNOSIS — N179 Acute kidney failure, unspecified: Secondary | ICD-10-CM | POA: Diagnosis not present

## 2020-02-05 LAB — BASIC METABOLIC PANEL
Anion gap: 8 (ref 5–15)
BUN: 12 mg/dL (ref 8–23)
CO2: 23 mmol/L (ref 22–32)
Calcium: 8.9 mg/dL (ref 8.9–10.3)
Chloride: 109 mmol/L (ref 98–111)
Creatinine, Ser: 1.16 mg/dL — ABNORMAL HIGH (ref 0.44–1.00)
GFR, Estimated: 52 mL/min — ABNORMAL LOW (ref >60)
Glucose, Bld: 72 mg/dL (ref 70–99)
Potassium: 2.9 mmol/L — ABNORMAL LOW (ref 3.5–5.1)
Sodium: 140 mmol/L (ref 135–145)

## 2020-02-05 LAB — VITAMIN B12: Vitamin B-12: 212 pg/mL (ref 180–914)

## 2020-02-05 LAB — VITAMIN D 25 HYDROXY (VIT D DEFICIENCY, FRACTURES): Vit D, 25-Hydroxy: 21.43 ng/mL — ABNORMAL LOW (ref 30–100)

## 2020-02-05 MED ORDER — CHOLECALCIFEROL 10 MCG (400 UNIT) PO TABS
400.0000 [IU] | ORAL_TABLET | Freq: Every day | ORAL | Status: DC
  Administered 2020-02-05 – 2020-02-06 (×2): 400 [IU] via ORAL
  Filled 2020-02-05 (×2): qty 1

## 2020-02-05 MED ORDER — L-METHYLFOLATE-B6-B12 3-35-2 MG PO TABS
1.0000 | ORAL_TABLET | Freq: Every day | ORAL | Status: DC
  Administered 2020-02-05 – 2020-02-06 (×2): 1 via ORAL
  Filled 2020-02-05 (×2): qty 1

## 2020-02-05 MED ORDER — POTASSIUM CHLORIDE CRYS ER 20 MEQ PO TBCR
40.0000 meq | EXTENDED_RELEASE_TABLET | Freq: Once | ORAL | Status: AC
  Administered 2020-02-05: 40 meq via ORAL
  Filled 2020-02-05: qty 2

## 2020-02-05 NOTE — Progress Notes (Signed)
Progress Note    [name]  HYW:[id number] DOB: 1955/04/21  DOA: 02/01/2020 PCP: Jodi Marble, MD    Brief Narrative:     Medical records reviewed and are as summarized below:  Anne [name] is an 64 y.o. female with past medical history of Crohn's disease (Dx'ed 1994, follows with Dr. Laury Axon artery disease (S/P STEMI 2019 with DES to RCA), avascular necrosis of the right hip who presents to [facility] emergency department with complaints of progressively worsening generalized weakness and diarrhea.  Patient explains that she has been suffering from waxing and waning loose stools and diarrhea for at least the past several months now.  In the past 2 weeks, patient has been experiencing increasing intensity of her diarrhea, experiencing several episodes daily.  Diarrhea is nonbloody, occasionally loose and occasionally watery.  Patient is complaining of associated abdominal cramping.  Abdominal cramping is waxing and waning in quality, severe in intensity, located in the right side of the abdomen and nonradiating.  Over the past 2 weeks of the patient's symptoms have persisted patient has begun to develop associated generalized weakness and poor appetite.  In the past several days patient has been experiencing severe lightheadedness as well, particularly with rising from a seated position.  Assessment/Plan:   Principal Problem:   AKI (acute kidney injury) ([place]) Active Problems:   Generalized weakness   Prolonged Q-T interval on ECG   Severe protein-calorie malnutrition (HCC)   Lactic acidosis   Crohn's disease of colon with complication (HCC)   Chronic diarrhea   Coronary artery disease involving native coronary artery of native heart without angina pectoris   Mixed hyperlipidemia   Essential hypertension   Abnormal LFTs   Acute diarrhea    AKI (acute kidney injury) ([place]) -Patient suffering from acute kidney injury, likely prerenal in origin  from excessive volume depletion -Hydrating patient  -daily labs, slowly improving    Chronic diarrhea due to Crohn's -Patient reports at least a several month history of ongoing diarrhea, now seemingly chronic -currently resolved -C. difficile testing neagtive -Patient is following as an outpatient with Dr. Benson Norway with gastroenterology and according to the patient her diarrhea is felt to be related to her Crohn's disease.   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 -did well with PT  Hypokalemia -replete    Lactic acidosis -resolved  Symptomatic hypotension -midodrine -TED hose    Crohn's disease of colon with complication [facility]) -Patient has longstanding history of Crohn's disease, diagnosed in 1994 -Patient now follows with Dr. Benson Norway with gastroenterology -Patient has been suffering from frequent bouts of diarrhea for at least the past several months.  According to the patient, she is to be initiated on Remicade infusion starting Monday- this will need to be postponed -diarrhea improved so no need for GI consult yet    Prolonged Q-T interval on ECG -Monitoring patient on telemetry -Avoiding QT prolonging agents    Severe protein-calorie malnutrition ([place]) -Patient reports longstanding history of poor oral intake with unintentional weight loss (around 60 lbs) -advancing diet    Coronary artery disease involving native coronary artery of native heart without angina pectoris -chest pain free -Monitoring patient on telemetry -Continue home regimen of Brilinta  -Temporarily holding metoprolol due to hypotension.    Essential hypertension -Temporarily holding home regimen of metoprolol due to hypotension. -trial  of midodrine as still hypotensive -cortisol normal    Abnormal  LFTs -appears chronic -defer to outpatient -Holding home regimen of statin therapy    Mixed hyperlipidemia  -Holding home regimen of statin therapy  Low B12 and vit D -replace and follow outpatient  Family Communication/Anticipated D/C date and plan/Code Status   DVT prophylaxis: Lovenox ordered. Code Status: Full Code.  Disposition Plan: Status is: Inpatient  Remains inpatient appropriate because:IV treatments appropriate due to intensity of illness or inability to take PO   Dispo: The patient is from: Home              Anticipated d/c is to: Home              Anticipated d/c date is: 1 day- 2 days              Patient currently is not medically stable to d/c.         Medical Consultants:    None.    Subjective:   Eating ok, some lower abd pain  Objective:    Vitals:   02/04/20 1233 02/04/20 1824 02/05/20 0600 02/05/20 1233  BP: 111/71 112/68 110/65 (!) 107/6  Pulse: 77 65 61 63  Resp: 16 16 16 16   Temp: 97.9 F (36.6 C) 99.1 F (37.3 C) 97.9 F (36.6 C) 97.6 F (36.4 C)  TempSrc: Oral Oral Oral Oral  SpO2: 100% 97% 98% 97%  Weight:      Height:        Intake/Output Summary (Last 24 hours) at 02/05/2020 1345 Last data filed at 02/05/2020 0900 Gross per 24 hour  Intake 1683.51 ml  Output --  Net 1683.51 ml   Filed Weights   02/01/20 1610 02/01/20 1613  Weight: 60.3 kg 59.9 kg    Exam:   General: Appearance:    Thin female in no acute distress     Lungs:      respirations unlabored  Heart:    Normal heart rate. Normal rhythm. No murmurs, rubs, or gallops.   MS:   All extremities are intact.   Neurologic:   Awake, alert, oriented x 3. No apparent focal neurological           defect.     Data Reviewed:   I have personally reviewed following labs and imaging studies:  Labs: Labs show the following:   Basic Metabolic Panel: Recent Labs  Lab 02/01/20 1625 02/01/20 1625 02/01/20 1750 02/02/20 0554 02/02/20 0554  02/03/20 0113 02/03/20 0113 02/04/20 0214 02/05/20 1000  NA 138  --   --  139  --  137  --  138 140  K 3.6   < >  --  3.1*   < > 3.8   < > 3.8 2.9*  CL 105  --   --  105  --  106  --  106 109  CO2 19*  --   --  21*  --  21*  --  23 23  GLUCOSE 146*  --   --  106*  --  77  --  90 72  BUN 24*  --   --  22  --  15  --  12 12  CREATININE 2.13*  --   --  1.63*  --  1.31*  --  1.15* 1.16*  CALCIUM 10.3  --   --  9.3  --  8.2*  --  8.5* 8.9  MG  --   --  2.1 1.6*  --   --   --  1.9  --   PHOS  --   --   --  3.0  --   --   --   --   --    < > = values in this interval not displayed.   GFR Estimated Creatinine Clearance: 45.1 mL/min (A) (by C-G formula based on SCr of 1.16 mg/dL (H)). Liver Function Tests: Recent Labs  Lab 02/01/20 1625 02/02/20 0554 02/03/20 0113  AST 148* 94* 47*  ALT 144* 104* 69*  ALKPHOS 136* 99 80  BILITOT 0.8 1.0 0.7  PROT 8.9* 6.8 5.9*  ALBUMIN 4.1 3.1* 2.8*   Recent Labs  Lab 02/01/20 1625  LIPASE 50   No results for input(s): AMMONIA in the last 168 hours. Coagulation profile Recent Labs  Lab 02/01/20 1750  INR 1.1    CBC: Recent Labs  Lab 02/01/20 1625 02/02/20 0554 02/03/20 0113 02/04/20 0214  WBC 5.2 7.5 5.4 6.6  NEUTROABS  --  4.4  --   --   HGB 16.1* 13.7 11.8* 12.1  HCT 49.1* 39.7 35.5* 36.3  MCV 93.2 90.2 93.9 92.1  PLT 417* 300 292 293   Cardiac Enzymes: Recent Labs  Lab 02/02/20 0554  CKTOTAL 91   BNP (last 3 results) No results for input(s): PROBNP in the last 8760 hours. CBG: No results for input(s): GLUCAP in the last 168 hours. D-Dimer: No results for input(s): DDIMER in the last 72 hours. Hgb A1c: No results for input(s): HGBA1C in the last 72 hours. Lipid Profile: No results for input(s): CHOL, HDL, LDLCALC, TRIG, CHOLHDL, LDLDIRECT in the last 72 hours. Thyroid function studies: No results for input(s): TSH, T4TOTAL, T3FREE, THYROIDAB in the last 72 hours.  Invalid input(s): FREET3 Anemia work up: Recent  Labs    02/05/20 0924  VITAMINB12 212   Sepsis Labs: Recent Labs  Lab 02/01/20 1625 02/01/20 1755 02/01/20 2033 02/02/20 0554 02/03/20 0113 02/04/20 0214  WBC 5.2  --   --  7.5 5.4 6.6  LATICACIDVEN  --  3.4* 1.8  --   --   --     Microbiology Recent Results (from the past 240 hour(s))  Blood Culture (routine x 2)     Status: None (Preliminary result)   Collection Time: 02/01/20  5:24 PM   Specimen: BLOOD  Result Value Ref Range Status   Specimen Description BLOOD RIGHT ARM  Final   Special Requests   Final    BOTTLES DRAWN AEROBIC AND ANAEROBIC Blood Culture adequate volume   Culture   Final    NO GROWTH 4 DAYS Performed at [facility], [place] [address] [place], [place] [address]    Report Status PENDING  Incomplete  Blood Culture (routine x 2)     Status: None (Preliminary result)   Collection Time: 02/01/20  5:58 PM   Specimen: BLOOD  Result Value Ref Range Status   Specimen Description BLOOD RIGHT WRIST  Final   Special Requests   Final    BOTTLES DRAWN AEROBIC AND ANAEROBIC Blood Culture results may not be optimal due to an inadequate volume of blood received in culture bottles   Culture   Final    NO GROWTH 4 DAYS Performed at [facility], [place] [address] [place], [place] [address]    Report Status PENDING  Incomplete  Urine culture     Status: Abnormal   Collection Time: 02/01/20  8:40 PM   Specimen: In/Out Cath Urine  Result Value Ref Range Status  Specimen Description IN/OUT CATH URINE  Final   Special Requests   Final    NONE Performed at [facility], [place] [address] [place], [place] [address]    Culture MULTIPLE SPECIES PRESENT, SUGGEST RECOLLECTION (A)  Final   Report Status 02/03/2020 FINAL  Final  Respiratory Panel by RT PCR (Flu A&B, Covid) - Nasopharyngeal Swab     Status: None   Collection Time: 02/01/20  9:10 PM   Specimen: Nasopharyngeal Swab  Result Value Ref Range Status   SARS Coronavirus 2 by RT PCR NEGATIVE  NEGATIVE Final    Comment: (NOTE) SARS-CoV-2 target nucleic acids are NOT DETECTED.  The SARS-CoV-2 RNA is generally detectable in upper respiratoy specimens during the acute phase of infection. The lowest concentration of SARS-CoV-2 viral copies this assay can detect is 131 copies/mL. A negative result does not preclude SARS-Cov-2 infection and should not be used as the sole basis for treatment or other patient management decisions. A negative result may occur with  improper specimen collection/handling, submission of specimen other than nasopharyngeal swab, presence of viral mutation(s) within the areas targeted by this assay, and inadequate number of viral copies (<131 copies/mL). A negative result must be combined with clinical observations, patient history, and epidemiological information. The expected result is Negative.  Fact Sheet for Patients:  PinkCheek.be  Fact Sheet for Healthcare Providers:  GravelBags.it  This test is no t yet approved or cleared by the [place] FDA and  has been authorized for detection and/or diagnosis of SARS-CoV-2 by FDA under an Emergency Use Authorization (EUA). This EUA will remain  in effect (meaning this test can be used) for the duration of the COVID-19 declaration under Section 564(b)(1) of the Act, 21 U.S.C. section 360bbb-3(b)(1), unless the authorization is terminated or revoked sooner.     Influenza A by PCR NEGATIVE NEGATIVE Final   Influenza B by PCR NEGATIVE NEGATIVE Final    Comment: (NOTE) The Xpert Xpress SARS-CoV-2/FLU/RSV assay is intended as an aid in  the diagnosis of influenza from Nasopharyngeal swab specimens and  should not be used as a sole basis for treatment. Nasal washings and  aspirates are unacceptable for Xpert Xpress SARS-CoV-2/FLU/RSV  testing.  Fact Sheet for Patients: PinkCheek.be  Fact Sheet for Healthcare  Providers: GravelBags.it  This test is not yet approved or cleared by the [place] FDA and  has been authorized for detection and/or diagnosis of SARS-CoV-2 by  FDA under an Emergency Use Authorization (EUA). This EUA will remain  in effect (meaning this test can be used) for the duration of the  Covid-19 declaration under Section 564(b)(1) of the Act, 21  U.S.C. section 360bbb-3(b)(1), unless the authorization is  terminated or revoked. Performed at [facility], [place] [address] [place], [place] [address]   C Difficile Quick Screen w PCR reflex     Status: None   Collection Time: 02/03/20  7:17 PM   Specimen: STOOL  Result Value Ref Range Status   C Diff antigen NEGATIVE NEGATIVE Final   C Diff toxin NEGATIVE NEGATIVE Final   C Diff interpretation No C. difficile detected.  Final    Comment: NEGATIVE Performed at [facility], [place] [address] [place], [place] [address]   Gastrointestinal Panel by PCR , Stool     Status: None   Collection Time: 02/03/20  7:17 PM   Specimen: STOOL  Result Value Ref Range Status   Campylobacter species NOT DETECTED NOT DETECTED Final  Plesimonas shigelloides NOT DETECTED NOT DETECTED Final   Salmonella species NOT DETECTED NOT DETECTED Final   Yersinia enterocolitica NOT DETECTED NOT DETECTED Final   Vibrio species NOT DETECTED NOT DETECTED Final   Vibrio cholerae NOT DETECTED NOT DETECTED Final   Enteroaggregative E coli (EAEC) NOT DETECTED NOT DETECTED Final   Enteropathogenic E coli (EPEC) NOT DETECTED NOT DETECTED Final   Enterotoxigenic E coli (ETEC) NOT DETECTED NOT DETECTED Final   Shiga like toxin producing E coli (STEC) NOT DETECTED NOT DETECTED Final   Shigella/Enteroinvasive E coli (EIEC) NOT DETECTED NOT DETECTED Final   Cryptosporidium NOT DETECTED NOT DETECTED Final   Cyclospora cayetanensis NOT DETECTED NOT DETECTED Final   Entamoeba histolytica NOT DETECTED NOT DETECTED Final    Giardia lamblia NOT DETECTED NOT DETECTED Final   Adenovirus F40/41 NOT DETECTED NOT DETECTED Final   Astrovirus NOT DETECTED NOT DETECTED Final   Norovirus GI/GII NOT DETECTED NOT DETECTED Final   Rotavirus A NOT DETECTED NOT DETECTED Final   Sapovirus (I, II, IV, and V) NOT DETECTED NOT DETECTED Final    Comment: Performed at [facility], [place] [place], [place] [address]    Procedures and diagnostic studies:  No results found.  Medications:   . cholecalciferol  400 Units Oral Daily  . cholestyramine  4 g Oral Q12H  . cyclobenzaprine  10 mg Oral QHS  . enoxaparin (LOVENOX) injection  40 mg Subcutaneous Daily  . feeding supplement  1 Container Oral TID BM  . l-methylfolate-B6-B12  1 tablet Oral Daily  . midodrine  5 mg Oral TID WC  . potassium chloride  40 mEq Oral Once  . sodium bicarbonate  1,950 mg Oral BID  . ticagrelor  60 mg Oral BID   Continuous Infusions:    LOS: 3 days   Geradine Girt  [facility]   How to contact the [facility] Attending or Consulting provider [place] or covering provider during after hours [place], for this patient?  1. Check the care team in [facility] and look for a) attending/consulting TRH provider listed and b) the [facility] team listed 2. Log into [email or web address] and use [place]'s universal password to access. If you do not have the password, please contact the hospital operator. 3. Locate the [facility] provider you are looking for under [facility] and page to a number that you can be directly reached. 4. If you still have difficulty reaching the provider, please page the [facility] (Director on Call) for the Hospitalists listed on amion for assistance.  02/05/2020, 1:45 PM

## 2020-02-05 NOTE — Progress Notes (Signed)
Was told by off going nurse that pt tolerated all meals on past shift with discomfort or loose bowels following. Pt stated that her last BM was on 02/03/20 and she has been having minimal discomfort. Monitoring continues.

## 2020-02-05 NOTE — Progress Notes (Signed)
Physical Therapy Treatment Patient Details Name: Anne [name] MRN: [id number] DOB: Feb 17, 1956 Today's Date: 02/05/2020    History of Present Illness 64 yo female with onset of weakness and worsening diarrhea was admitted for care.  Has been having symptoms for months, but worse now.  R lower abd pain, AKI, hypotensive, dehydrated.  PMHx:  CAD, STEMI, Crohn's, avascular necrosis of R hip, atherosclerosis, kidney stones,     PT Comments    Pt progressing well towards physical therapy goals. She demonstrated the ability to perform transfers and ambulation with supervision to modified independence by end of session without an AD. Retested strength and pt 5/5 in quads, hip flexors, hamstrings, ankle dorsiflexors. She endorses mild N/T in her L posterior calf however states this has been long term and relates it to her back. Overall pt is mobilizing well. I would like to keep her on caseload at a 1x/week frequency to ensure pt is not declining in function, however it seems she will be discharging soon. Will continue to follow and progress as able per POC.    Follow Up Recommendations  No PT follow up;Supervision - Intermittent     Equipment Recommendations  None recommended by PT    Recommendations for Other Services       Precautions / Restrictions Precautions Precautions: Fall Precaution Comments: enteric precautions Restrictions Weight Bearing Restrictions: No    Mobility  Bed Mobility Overal bed mobility: Modified Independent             General bed mobility comments: HOB mildly elevated and no assist required. Min use of rails but feel she could likely complete without rails if needed.   Transfers Overall transfer level: Modified independent Equipment used: None Transfers: Sit to/from Stand           General transfer comment: Pt demonstrated proper hand placement on seated surface for safety. No assist required and no unsteadiness noted.    Ambulation/Gait Ambulation/Gait assistance: Supervision;Modified independent (Device/Increase time) Gait Distance (Feet): 600 Feet Assistive device: None Gait Pattern/deviations: Step-through pattern;Decreased stride length;Narrow base of support Gait velocity: Decreased Gait velocity interpretation: 1.31 - 2.62 ft/sec, indicative of limited community ambulator General Gait Details: Slow but generally steady. Initially supervision provided as this was my first time observing the pt walking, but progressed to modified independence quickly. Pt safe and slowed down at times due to fatigue, but did not require any seated or standing rest breaks.    Stairs Stairs: Yes Stairs assistance: Supervision;Modified independent (Device/Increase time) Stair Management: One rail Right;Alternating pattern;Forwards Number of Stairs: 10 General stair comments: Mod I ascending, and light supervision for safety to descend.    Wheelchair Mobility    Modified Rankin (Stroke Patients Only)       Balance Overall balance assessment: Mild deficits observed, not formally tested                               Standardized Balance Assessment Standardized Balance Assessment : Dynamic Gait Index   Dynamic Gait Index Level Surface: Mild Impairment Change in Gait Speed: Normal Gait with Horizontal Head Turns: Normal Gait with Vertical Head Turns: Normal Gait and Pivot Turn: Normal Step Over Obstacle: Mild Impairment Step Around Obstacles: Normal Steps: Mild Impairment Total Score: 21      Cognition Arousal/Alertness: Awake/alert Behavior During Therapy: WFL for tasks assessed/performed Overall Cognitive Status: Within Functional Limits for tasks assessed  Exercises      General Comments        Pertinent Vitals/Pain Pain Assessment: No/denies pain    Home Living                      Prior Function             PT Goals (current goals can now be found in the care plan section) Acute Rehab PT Goals Patient Stated Goal: to get home to her dog PT Goal Formulation: With patient Time For Goal Achievement: 02/09/20 Potential to Achieve Goals: Good Progress towards PT goals: Progressing toward goals    Frequency    Min 1X/week      PT Plan Frequency needs to be updated    Co-evaluation              AM-PAC PT "6 Clicks" Mobility   Outcome Measure  Help needed turning from your back to your side while in a flat bed without using bedrails?: None Help needed moving from lying on your back to sitting on the side of a flat bed without using bedrails?: None Help needed moving to and from a bed to a chair (including a wheelchair)?: A Little Help needed standing up from a chair using your arms (e.g., wheelchair or bedside chair)?: A Little Help needed to walk in hospital room?: A Little Help needed climbing 3-5 steps with a railing? : A Little 6 Click Score: 20    End of Session Equipment Utilized During Treatment: Gait belt Activity Tolerance: Patient tolerated treatment well Patient left: in bed;with call bell/phone within reach Nurse Communication: Mobility status PT Visit Diagnosis: Difficulty in walking, not elsewhere classified (R26.2)     Time: 9532-0233 PT Time Calculation (min) (ACUTE ONLY): 21 min  Charges:  $Gait Training: 8-22 mins                     Anne [name], PT, DPT Acute Rehabilitation Services Pager: [phone] Office: [phone]    Anne [name] 02/05/2020, 2:19 PM

## 2020-02-06 DIAGNOSIS — N179 Acute kidney failure, unspecified: Secondary | ICD-10-CM | POA: Diagnosis not present

## 2020-02-06 LAB — BASIC METABOLIC PANEL
Anion gap: 12 (ref 5–15)
BUN: 12 mg/dL (ref 8–23)
CO2: 19 mmol/L — ABNORMAL LOW (ref 22–32)
Calcium: 9 mg/dL (ref 8.9–10.3)
Chloride: 108 mmol/L (ref 98–111)
Creatinine, Ser: 1.13 mg/dL — ABNORMAL HIGH (ref 0.44–1.00)
GFR, Estimated: 54 mL/min — ABNORMAL LOW (ref >60)
Glucose, Bld: 91 mg/dL (ref 70–99)
Potassium: 3.8 mmol/L (ref 3.5–5.1)
Sodium: 139 mmol/L (ref 135–145)

## 2020-02-06 LAB — CULTURE, BLOOD (ROUTINE X 2)
Culture: NO GROWTH
Culture: NO GROWTH
Special Requests: ADEQUATE

## 2020-02-06 MED ORDER — CHOLECALCIFEROL 10 MCG (400 UNIT) PO TABS
400.0000 [IU] | ORAL_TABLET | Freq: Every day | ORAL | 0 refills | Status: DC
Start: 2020-02-07 — End: 2021-12-10

## 2020-02-06 MED ORDER — ACETAMINOPHEN-CODEINE #3 300-30 MG PO TABS: 1.0000 | ORAL_TABLET | ORAL | 0 refills | Status: AC | PRN

## 2020-02-06 MED ORDER — MIRTAZAPINE 15 MG PO TBDP
7.5000 mg | ORAL_TABLET | Freq: Every day | ORAL | Status: DC
  Filled 2020-02-06: qty 0.5

## 2020-02-06 MED ORDER — MIDODRINE HCL 5 MG PO TABS
5.0000 mg | ORAL_TABLET | Freq: Three times a day (TID) | ORAL | 0 refills | Status: DC
Start: 2020-02-06 — End: 2022-05-22

## 2020-02-06 MED ORDER — TEMAZEPAM 7.5 MG PO CAPS: 7.5000 mg | ORAL_CAPSULE | Freq: Every day | ORAL | 0 refills | Status: DC

## 2020-02-06 MED ORDER — ROSUVASTATIN CALCIUM 40 MG PO TABS
40.0000 mg | ORAL_TABLET | Freq: Every day | ORAL | Status: DC
Start: 2020-02-20 — End: 2022-05-22

## 2020-02-06 MED ORDER — L-METHYLFOLATE-B6-B12 3-35-2 MG PO TABS
1.0000 | ORAL_TABLET | Freq: Every day | ORAL | Status: DC
Start: 2020-02-07 — End: 2021-12-10

## 2020-02-06 NOTE — Care Management Important Message (Signed)
Important Message  Patient Details  Name: Anne [name] MRN: [id number] Date of Birth: 1956/01/20   Medicare Important Message Given:  Yes - Important Message mailed due to current National Emergency  Verbal consent obtained due to current National Emergency  Relationship to patient: Self Contact Name: Quorra Rosene Call Date: 02/06/20  Time: 3968 Phone: [phone] Outcome: Spoke with contact Important Message mailed to: Patient address on file    Delorse Lek 02/06/2020, 9:24 AM

## 2020-02-06 NOTE — Discharge Summary (Signed)
Physician Discharge Summary  Anne [name] VZD:[id number] DOB: 08/08/1955 DOA: 02/01/2020  PCP: Jodi Marble, MD  Admit date: 02/01/2020 Discharge date: 02/06/2020  Admitted From: home Discharge disposition: home   Recommendations for Outpatient Follow-Up:   1. BP meds held due to hypotension (patient has had rapid weight loss recently) 2. Close follow up with Dr. Benson Norway (GI) 3. Trial of remeron for appetite: titrate up to appropriate dose 4. Monitor B12 and vit D 5. Trend LFTs   Discharge Diagnosis:   Principal Problem:   AKI (acute kidney injury) ([place]) Active Problems:   Generalized weakness   Prolonged Q-T interval on ECG   Severe protein-calorie malnutrition (HCC)   Lactic acidosis   Crohn's disease of colon with complication (HCC)   Chronic diarrhea   Coronary artery disease involving native coronary artery of native heart without angina pectoris   Mixed hyperlipidemia   Essential hypertension   Abnormal LFTs   Acute diarrhea    Discharge Condition: Improved.  Diet recommendation: regular  Wound care: None.  Code status: Full.   History of Present Illness:   64 year old female with past medical history of Crohn's disease (Dx'ed 1994, follows with Dr. Laury Axon artery disease (S/P STEMI 2019 with DES to RCA), avascular necrosis of the right hip who presents to [facility] emergency department with complaints of progressively worsening generalized weakness and diarrhea.  Patient explains that she has been suffering from waxing and waning loose stools and diarrhea for at least the past several months now.  In the past 2 weeks, patient has been experiencing increasing intensity of her diarrhea, experiencing several episodes daily.  Diarrhea is nonbloody, occasionally loose and occasionally watery.  Patient is complaining of associated abdominal cramping.  Abdominal cramping is waxing and waning in quality, severe in intensity,  located in the right side of the abdomen and nonradiating.  Over the past 2 weeks of the patient's symptoms have persisted patient has begun to develop associated generalized weakness and poor appetite.  In the past several days patient has been experiencing severe lightheadedness as well, particularly with rising from a seated position.  Due to patient's progressively worsening symptoms patient eventually presented to [facility] emergency department for evaluation.    Upon evaluation in the emergency department, patient was initially found to be hypotensive with blood pressure of 77/51.  Patient was clinically found to be dehydrated with concurrent acute kidney injury and creatinine of 2.13.  Patient received initial dosing of intravenous cefepime and Flagyl by the emergency department staff.  Patient was also given 2000 cc of LR bolus.  The hospitalist group was then called to assess the patient for admission to the hospital.    Hospital Course by Problem:   AKI (acute kidney injury) ([place]) -Patient suffering from acute kidney injury, likely prerenal in origin from excessive volume depletion -resolved after hydration and hypotension correction  Chronic diarrhea due to Crohn's -Patient reports at least a several month history of ongoing diarrhea, now seemingly chronic -currently resolved -C. difficile testing neagtive -Patient is following as an outpatient withDr. Benson Norway withgastroenterology and according to the patienther diarrheais felt to be related to her Crohn's disease.  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 -did well with PT  Hypokalemia -repleted  Lactic acidosis -resolved  Symptomatic hypotension -midodrine -TED hose -suspect her recent  weight loss is  contributing  Crohn's disease of colon with complication [facility]) -Patient has longstanding history of Crohn's disease, diagnosed in 1994 -Patient now follows with Dr. Benson Norway with gastroenterology -Patient has been suffering from frequent bouts of diarrhea for at least the past several months. According to the patient, she is to be initiated on Remicade infusion starting Monday- this will need to be postponed  h/o Prolonged Q-T interval  -qtc [id number]  Severe protein-calorie malnutrition ([place]) -Patient reports longstanding history of poor oral intake with unintentional weight loss (around 60 lbs) -eating well on day of discharge-- 100 % of meal -started remeron-- titrate to proper dose for appetitie  Coronary artery disease involving native coronary artery of native heart without angina pectoris -chest pain free -Continue home regimen ofBrilinta  -Temporarily holding metoprolol due to hypotension- resume outpatient if needed  Abnormal LFTs -appears chronic -defer to outpatient -Holding home regimen of statin therapy for now -may need to change her home pain meds of tylenol #4  Mixed hyperlipidemia -Holding home regimen of statin therapy  Low B12 and vit D -replace and follow outpatient    Medical Consultants:      Discharge Exam:   Vitals:   02/06/20 0608 02/06/20 1141  BP: 113/84 116/81  Pulse: 65 76  Resp: 15 17  Temp: 97.9 F (36.6 C) 98.5 F (36.9 C)  SpO2: 100% 100%   Vitals:   02/05/20 1631 02/05/20 2206 02/06/20 0608 02/06/20 1141  BP: 115/62 127/83 113/84 116/81  Pulse: (!) 59 73 65 76  Resp: 16 18 15 17   Temp: 98.5 F (36.9 C) 98.2 F (36.8 C) 97.9 F (36.6 C) 98.5 F (36.9 C)  TempSrc: Oral Oral Oral Oral  SpO2: 98% 96% 100% 100%  Weight:      Height:        General exam: Appears calm and comfortable.  The results of significant diagnostics from this hospitalization (including imaging, microbiology, ancillary and laboratory)  are listed below for reference.     Procedures and Diagnostic Studies:   CT Abdomen Pelvis Wo Contrast  Result Date: 02/01/2020 CLINICAL DATA:  64 year old female with abdominal pain. EXAM: CT ABDOMEN AND PELVIS WITHOUT CONTRAST TECHNIQUE: Multidetector CT imaging of the abdomen and pelvis was performed following the standard protocol without IV contrast. COMPARISON:  Abdominal MRI dated 08/07/2019. FINDINGS: Evaluation of this exam is limited in the absence of intravenous contrast. Lower chest: The visualized lung bases are clear. There is coronary vascular calcification. No intra-abdominal free air or free fluid. Hepatobiliary: Diffuse fatty liver. No intrahepatic biliary ductal dilatation. Cholecystectomy. No retained calcified stone noted in the central CBD. Pancreas: Unremarkable. No pancreatic ductal dilatation or surrounding inflammatory changes. Spleen: Normal in size without focal abnormality. Adrenals/Urinary Tract: The adrenal glands unremarkable. There is a 3 mm nonobstructing right renal inferior pole calculus. Additional punctate stone noted in the interpolar right kidney. No hydronephrosis. A 3.5 cm right renal upper pole cyst. There is no hydronephrosis or nephrolithiasis on the left. The visualized ureters and urinary bladder appear unremarkable. Stomach/Bowel: There are postsurgical changes of the bowel with anastomotic suture in the right lower quadrant. There is an 11 x 18 mm focal area of nodularity adjacent to the suture which is not well evaluated but may represent scarring (coronal 43/6). There is mild diffuse dilatation of small-bowel loops measuring up to 3.1 cm. No definite transition identified. Oral contrast is noted in the proximal colon. Liquid stool noted throughout the colon compatible with diarrheal state. Vascular/Lymphatic:  Mild aortoiliac atherosclerotic disease. The IVC is unremarkable. No portal venous gas. There is no adenopathy. Reproductive: The uterus is  retroflexed.  No adnexal masses. Other: None Musculoskeletal: Degenerative changes of the lower lumbar spine. No acute osseous pathology. Avascular necrosis of the right femoral head. No acute fracture or cortical collapse. IMPRESSION: 1. Mildly dilated small bowel loops without transition may represent ileus. Clinical correlation is recommended. 2. Diarrheal state. Correlation with clinical exam and stool cultures recommended. 3. Fatty liver. 4. Nonobstructing right renal calculi. No hydronephrosis. 5. Aortic Atherosclerosis (ICD10-I70.0). Electronically Signed   By: Anner Crete M.D.   On: 02/01/2020 21:49   DG Chest Port 1 View  Result Date: 02/01/2020 CLINICAL DATA:  Weakness and dizziness EXAM: PORTABLE CHEST 1 VIEW COMPARISON:  08/04/2019 FINDINGS: The heart size and mediastinal contours are within normal limits. Both lungs are clear. The visualized skeletal structures are unremarkable. Postsurgical changes are seen in the cervical spine. IMPRESSION: No active disease. Electronically Signed   By: Inez Catalina M.D.   On: 02/01/2020 18:10     Labs:   Basic Metabolic Panel: Recent Labs  Lab 02/01/20 1625 02/01/20 1750 02/02/20 0554 02/02/20 0554 02/03/20 0113 02/03/20 0113 02/04/20 0214 02/04/20 0214 02/05/20 1000 02/06/20 0312  NA   < >  --  139  --  137  --  138  --  140 139  K   < >  --  3.1*   < > 3.8   < > 3.8   < > 2.9* 3.8  CL   < >  --  105  --  106  --  106  --  109 108  CO2   < >  --  21*  --  21*  --  23  --  23 19*  GLUCOSE   < >  --  106*  --  77  --  90  --  72 91  BUN   < >  --  22  --  15  --  12  --  12 12  CREATININE   < >  --  1.63*  --  1.31*  --  1.15*  --  1.16* 1.13*  CALCIUM   < >  --  9.3  --  8.2*  --  8.5*  --  8.9 9.0  MG  --  2.1 1.6*  --   --   --  1.9  --   --   --   PHOS  --   --  3.0  --   --   --   --   --   --   --    < > = values in this interval not displayed.   GFR Estimated Creatinine Clearance: 46.3 mL/min (A) (by C-G formula based  on SCr of 1.13 mg/dL (H)). Liver Function Tests: Recent Labs  Lab 02/01/20 1625 02/02/20 0554 02/03/20 0113  AST 148* 94* 47*  ALT 144* 104* 69*  ALKPHOS 136* 99 80  BILITOT 0.8 1.0 0.7  PROT 8.9* 6.8 5.9*  ALBUMIN 4.1 3.1* 2.8*   Recent Labs  Lab 02/01/20 1625  LIPASE 50   No results for input(s): AMMONIA in the last 168 hours. Coagulation profile Recent Labs  Lab 02/01/20 1750  INR 1.1    CBC: Recent Labs  Lab 02/01/20 1625 02/02/20 0554 02/03/20 0113 02/04/20 0214  WBC 5.2 7.5 5.4 6.6  NEUTROABS  --  4.4  --   --  HGB 16.1* 13.7 11.8* 12.1  HCT 49.1* 39.7 35.5* 36.3  MCV 93.2 90.2 93.9 92.1  PLT 417* 300 292 293   Cardiac Enzymes: Recent Labs  Lab 02/02/20 0554  CKTOTAL 91   BNP: Invalid input(s): POCBNP CBG: No results for input(s): GLUCAP in the last 168 hours. D-Dimer No results for input(s): DDIMER in the last 72 hours. Hgb A1c No results for input(s): HGBA1C in the last 72 hours. Lipid Profile No results for input(s): CHOL, HDL, LDLCALC, TRIG, CHOLHDL, LDLDIRECT in the last 72 hours. Thyroid function studies No results for input(s): TSH, T4TOTAL, T3FREE, THYROIDAB in the last 72 hours.  Invalid input(s): FREET3 Anemia work up Recent Labs    02/05/20 0924  [id number]   Microbiology Recent Results (from the past 240 hour(s))  Blood Culture (routine x 2)     Status: None   Collection Time: 02/01/20  5:24 PM   Specimen: BLOOD  Result Value Ref Range Status   Specimen Description BLOOD RIGHT ARM  Final   Special Requests   Final    BOTTLES DRAWN AEROBIC AND ANAEROBIC Blood Culture adequate volume   Culture   Final    NO GROWTH 5 DAYS Performed at [facility], [address], [place], [place] [address]    Report Status 02/06/2020 FINAL  Final  Blood Culture (routine x 2)     Status: None   Collection Time: 02/01/20  5:58 PM   Specimen: BLOOD  Result Value Ref Range Status   Specimen Description BLOOD RIGHT WRIST  Final    Special Requests   Final    BOTTLES DRAWN AEROBIC AND ANAEROBIC Blood Culture results may not be optimal due to an inadequate volume of blood received in culture bottles   Culture   Final    NO GROWTH 5 DAYS Performed at [facility], [place] [address] [place], [place] [address]    Report Status 02/06/2020 FINAL  Final  Urine culture     Status: Abnormal   Collection Time: 02/01/20  8:40 PM   Specimen: In/Out Cath Urine  Result Value Ref Range Status   Specimen Description IN/OUT CATH URINE  Final   Special Requests   Final    NONE Performed at [facility], [place] [address], [place], [place] [address]    Culture MULTIPLE SPECIES PRESENT, SUGGEST RECOLLECTION (A)  Final   Report Status 02/03/2020 FINAL  Final  Respiratory Panel by RT PCR (Flu A&B, Covid) - Nasopharyngeal Swab     Status: None   Collection Time: 02/01/20  9:10 PM   Specimen: Nasopharyngeal Swab  Result Value Ref Range Status   SARS Coronavirus 2 by RT PCR NEGATIVE NEGATIVE Final    Comment: (NOTE) SARS-CoV-2 target nucleic acids are NOT DETECTED.  The SARS-CoV-2 RNA is generally detectable in upper respiratoy specimens during the acute phase of infection. The lowest concentration of SARS-CoV-2 viral copies this assay can detect is 131 copies/mL. A negative result does not preclude SARS-Cov-2 infection and should not be used as the sole basis for treatment or other patient management decisions. A negative result may occur with  improper specimen collection/handling, submission of specimen other than nasopharyngeal swab, presence of viral mutation(s) within the areas targeted by this assay, and inadequate number of viral copies (<131 copies/mL). A negative result must be combined with clinical observations, patient history, and epidemiological information. The expected result is Negative.  Fact Sheet for Patients:  PinkCheek.be  Fact Sheet for  Healthcare Providers:    GravelBags.it  This test is no t yet approved or cleared by the [place] and  has been authorized for detection and/or diagnosis of SARS-CoV-2 by FDA under an Emergency Use Authorization (EUA). This EUA will remain  in effect (meaning this test can be used) for the duration of the COVID-19 declaration under Section 564(b)(1) of the Act, 21 U.S.C. section 360bbb-3(b)(1), unless the authorization is terminated or revoked sooner.     Influenza A by PCR NEGATIVE NEGATIVE Final   Influenza B by PCR NEGATIVE NEGATIVE Final    Comment: (NOTE) The Xpert Xpress SARS-CoV-2/FLU/RSV assay is intended as an aid in  the diagnosis of influenza from Nasopharyngeal swab specimens and  should not be used as a sole basis for treatment. Nasal washings and  aspirates are unacceptable for Xpert Xpress SARS-CoV-2/FLU/RSV  testing.  Fact Sheet for Patients: PinkCheek.be  Fact Sheet for Healthcare Providers: GravelBags.it  This test is not yet approved or cleared by the [place] FDA and  has been authorized for detection and/or diagnosis of SARS-CoV-2 by  FDA under an Emergency Use Authorization (EUA). This EUA will remain  in effect (meaning this test can be used) for the duration of the  Covid-19 declaration under Section 564(b)(1) of the Act, 21  U.S.C. section 360bbb-3(b)(1), unless the authorization is  terminated or revoked. Performed at [facility], [place] [address], [place], [place] [address]   C Difficile Quick Screen w PCR reflex     Status: None   Collection Time: 02/03/20  7:17 PM   Specimen: STOOL  Result Value Ref Range Status   C Diff antigen NEGATIVE NEGATIVE Final   C Diff toxin NEGATIVE NEGATIVE Final   C Diff interpretation No C. difficile detected.  Final    Comment: NEGATIVE Performed at [facility], [place] [address] [place], [place] [address]   Gastrointestinal  Panel by PCR , Stool     Status: None   Collection Time: 02/03/20  7:17 PM   Specimen: STOOL  Result Value Ref Range Status   Campylobacter species NOT DETECTED NOT DETECTED Final   Plesimonas shigelloides NOT DETECTED NOT DETECTED Final   Salmonella species NOT DETECTED NOT DETECTED Final   Yersinia enterocolitica NOT DETECTED NOT DETECTED Final   Vibrio species NOT DETECTED NOT DETECTED Final   Vibrio cholerae NOT DETECTED NOT DETECTED Final   Enteroaggregative E coli (EAEC) NOT DETECTED NOT DETECTED Final   Enteropathogenic E coli (EPEC) NOT DETECTED NOT DETECTED Final   Enterotoxigenic E coli (ETEC) NOT DETECTED NOT DETECTED Final   Shiga like toxin producing E coli (STEC) NOT DETECTED NOT DETECTED Final   Shigella/Enteroinvasive E coli (EIEC) NOT DETECTED NOT DETECTED Final   Cryptosporidium NOT DETECTED NOT DETECTED Final   Cyclospora cayetanensis NOT DETECTED NOT DETECTED Final   Entamoeba histolytica NOT DETECTED NOT DETECTED Final   Giardia lamblia NOT DETECTED NOT DETECTED Final   Adenovirus F40/41 NOT DETECTED NOT DETECTED Final   Astrovirus NOT DETECTED NOT DETECTED Final   Norovirus GI/GII NOT DETECTED NOT DETECTED Final   Rotavirus A NOT DETECTED NOT DETECTED Final   Sapovirus (I, II, IV, and V) NOT DETECTED NOT DETECTED Final    Comment: Performed at [facility], [address], [place], [place] [address]     Discharge Instructions:   Discharge Instructions    Diet general   Complete by: As directed    Discharge instructions   Complete by:  As directed    Hold BP meds for now-- check BP and if consistently > 120/80 then stop midodrine Can take a b complex vitamin OTC or just B12   Increase activity slowly   Complete by: As directed      Allergies as of 02/06/2020   No Known Allergies     Medication List    STOP taking these medications   acetaminophen-codeine 300-60 MG tablet Commonly known as: TYLENOL #4 Replaced by: acetaminophen-codeine  300-30 MG tablet   aspirin 81 MG EC tablet   enalapril 2.5 MG tablet Commonly known as: VASOTEC   metoprolol succinate 25 MG 24 hr tablet Commonly known as: TOPROL-XL     TAKE these medications   acetaminophen-codeine 300-30 MG tablet Commonly known as: TYLENOL #3 Take 1 tablet by mouth every 4 (four) hours as needed for up to 3 days for moderate pain. Replaces: acetaminophen-codeine 300-60 MG tablet   Brilinta 60 MG Tabs tablet Generic drug: ticagrelor Take 60 mg by mouth 2 (two) times daily.   cholecalciferol 10 MCG (400 UNIT) Tabs tablet Commonly known as: VITAMIN D3 Take 1 tablet (400 Units total) by mouth daily. Start taking on: February 07, 2020   cholestyramine 4 g packet Commonly known as: QUESTRAN Take 4 g by mouth 2 (two) times daily.   cyclobenzaprine 10 MG tablet Commonly known as: FLEXERIL Take 10 mg by mouth at bedtime.   l-methylfolate-B6-B12 3-35-2 MG Tabs tablet Commonly known as: METANX Take 1 tablet by mouth daily. Start taking on: February 07, 2020   midodrine 5 MG tablet Commonly known as: PROAMATINE Take 1 tablet (5 mg total) by mouth 3 (three) times daily with meals.   nitroGLYCERIN 0.4 MG SL tablet Commonly known as: NITROSTAT Place 1 tablet (0.4 mg total) under the tongue every 5 (five) minutes x 3 doses as needed for chest pain.   rosuvastatin 40 MG tablet Commonly known as: CRESTOR Take 1 tablet (40 mg total) by mouth daily. Start taking on: February 20, 2020 What changed: These instructions start on February 20, 2020. If you are unsure what to do until then, ask your doctor or other care provider.   sodium bicarbonate 650 MG tablet Take 3 tablets (1,950 mg total) by mouth 2 (two) times daily.   temazepam 7.5 MG capsule Commonly known as: RESTORIL Take 1 capsule (7.5 mg total) by mouth at bedtime.       Follow-up Information    Jodi Marble, MD Follow up in 1 week(s).   Specialty: Internal Medicine Contact  information: [place] [address] [phone]                Time coordinating discharge: 35 min  Signed:  Geradine Girt DO  [facility] 02/06/2020, 1:19 PM

## 2020-02-14 DIAGNOSIS — I10 Essential (primary) hypertension: Secondary | ICD-10-CM | POA: Diagnosis not present

## 2020-02-14 DIAGNOSIS — F17210 Nicotine dependence, cigarettes, uncomplicated: Secondary | ICD-10-CM | POA: Diagnosis not present

## 2020-02-14 DIAGNOSIS — I2510 Atherosclerotic heart disease of native coronary artery without angina pectoris: Secondary | ICD-10-CM | POA: Diagnosis not present

## 2020-02-14 DIAGNOSIS — E785 Hyperlipidemia, unspecified: Secondary | ICD-10-CM | POA: Diagnosis not present

## 2020-02-14 DIAGNOSIS — Z9861 Coronary angioplasty status: Secondary | ICD-10-CM | POA: Diagnosis not present

## 2020-02-22 DIAGNOSIS — K5090 Crohn's disease, unspecified, without complications: Secondary | ICD-10-CM | POA: Diagnosis not present

## 2020-02-26 DIAGNOSIS — M5416 Radiculopathy, lumbar region: Secondary | ICD-10-CM | POA: Diagnosis not present

## 2020-02-26 DIAGNOSIS — I2510 Atherosclerotic heart disease of native coronary artery without angina pectoris: Secondary | ICD-10-CM | POA: Diagnosis not present

## 2020-02-26 DIAGNOSIS — R197 Diarrhea, unspecified: Secondary | ICD-10-CM | POA: Diagnosis not present

## 2020-02-26 DIAGNOSIS — N189 Chronic kidney disease, unspecified: Secondary | ICD-10-CM | POA: Diagnosis not present

## 2020-02-26 DIAGNOSIS — E876 Hypokalemia: Secondary | ICD-10-CM | POA: Diagnosis not present

## 2020-02-26 DIAGNOSIS — K5090 Crohn's disease, unspecified, without complications: Secondary | ICD-10-CM | POA: Diagnosis not present

## 2020-02-26 DIAGNOSIS — E860 Dehydration: Secondary | ICD-10-CM | POA: Diagnosis not present

## 2020-02-26 DIAGNOSIS — I739 Peripheral vascular disease, unspecified: Secondary | ICD-10-CM | POA: Diagnosis not present

## 2020-02-26 DIAGNOSIS — M5127 Other intervertebral disc displacement, lumbosacral region: Secondary | ICD-10-CM | POA: Diagnosis not present

## 2020-03-04 DIAGNOSIS — Z1231 Encounter for screening mammogram for malignant neoplasm of breast: Secondary | ICD-10-CM | POA: Diagnosis not present

## 2020-03-18 DIAGNOSIS — Z9861 Coronary angioplasty status: Secondary | ICD-10-CM | POA: Diagnosis not present

## 2020-03-18 DIAGNOSIS — I10 Essential (primary) hypertension: Secondary | ICD-10-CM | POA: Diagnosis not present

## 2020-03-18 DIAGNOSIS — I2510 Atherosclerotic heart disease of native coronary artery without angina pectoris: Secondary | ICD-10-CM | POA: Diagnosis not present

## 2020-03-18 DIAGNOSIS — E785 Hyperlipidemia, unspecified: Secondary | ICD-10-CM | POA: Diagnosis not present

## 2020-03-19 DIAGNOSIS — K5090 Crohn's disease, unspecified, without complications: Secondary | ICD-10-CM | POA: Diagnosis not present

## 2020-03-25 DIAGNOSIS — K5090 Crohn's disease, unspecified, without complications: Secondary | ICD-10-CM | POA: Diagnosis not present

## 2020-04-16 DIAGNOSIS — K5090 Crohn's disease, unspecified, without complications: Secondary | ICD-10-CM | POA: Diagnosis not present

## 2020-04-23 DIAGNOSIS — M609 Myositis, unspecified: Secondary | ICD-10-CM | POA: Diagnosis not present

## 2020-04-23 DIAGNOSIS — M2550 Pain in unspecified joint: Secondary | ICD-10-CM | POA: Diagnosis not present

## 2020-04-23 DIAGNOSIS — M549 Dorsalgia, unspecified: Secondary | ICD-10-CM | POA: Diagnosis not present

## 2020-04-23 DIAGNOSIS — M1990 Unspecified osteoarthritis, unspecified site: Secondary | ICD-10-CM | POA: Diagnosis not present

## 2020-04-23 DIAGNOSIS — R748 Abnormal levels of other serum enzymes: Secondary | ICD-10-CM | POA: Diagnosis not present

## 2020-04-23 DIAGNOSIS — K5090 Crohn's disease, unspecified, without complications: Secondary | ICD-10-CM | POA: Diagnosis not present

## 2020-04-23 DIAGNOSIS — Z79899 Other long term (current) drug therapy: Secondary | ICD-10-CM | POA: Diagnosis not present

## 2020-05-20 DIAGNOSIS — R197 Diarrhea, unspecified: Secondary | ICD-10-CM | POA: Diagnosis not present

## 2020-05-20 DIAGNOSIS — E876 Hypokalemia: Secondary | ICD-10-CM | POA: Diagnosis not present

## 2020-05-20 DIAGNOSIS — I2510 Atherosclerotic heart disease of native coronary artery without angina pectoris: Secondary | ICD-10-CM | POA: Diagnosis not present

## 2020-05-20 DIAGNOSIS — M5127 Other intervertebral disc displacement, lumbosacral region: Secondary | ICD-10-CM | POA: Diagnosis not present

## 2020-05-20 DIAGNOSIS — K5090 Crohn's disease, unspecified, without complications: Secondary | ICD-10-CM | POA: Diagnosis not present

## 2020-05-20 DIAGNOSIS — M5416 Radiculopathy, lumbar region: Secondary | ICD-10-CM | POA: Diagnosis not present

## 2020-05-20 DIAGNOSIS — N189 Chronic kidney disease, unspecified: Secondary | ICD-10-CM | POA: Diagnosis not present

## 2020-05-20 DIAGNOSIS — I739 Peripheral vascular disease, unspecified: Secondary | ICD-10-CM | POA: Diagnosis not present

## 2020-06-11 DIAGNOSIS — K5090 Crohn's disease, unspecified, without complications: Secondary | ICD-10-CM | POA: Diagnosis not present

## 2020-06-20 DIAGNOSIS — Z23 Encounter for immunization: Secondary | ICD-10-CM | POA: Diagnosis not present

## 2020-07-19 DIAGNOSIS — E876 Hypokalemia: Secondary | ICD-10-CM | POA: Diagnosis not present

## 2020-07-19 DIAGNOSIS — R197 Diarrhea, unspecified: Secondary | ICD-10-CM | POA: Diagnosis not present

## 2020-07-19 DIAGNOSIS — N189 Chronic kidney disease, unspecified: Secondary | ICD-10-CM | POA: Diagnosis not present

## 2020-07-19 DIAGNOSIS — M5127 Other intervertebral disc displacement, lumbosacral region: Secondary | ICD-10-CM | POA: Diagnosis not present

## 2020-07-19 DIAGNOSIS — K5090 Crohn's disease, unspecified, without complications: Secondary | ICD-10-CM | POA: Diagnosis not present

## 2020-07-19 DIAGNOSIS — M5416 Radiculopathy, lumbar region: Secondary | ICD-10-CM | POA: Diagnosis not present

## 2020-07-19 DIAGNOSIS — I2510 Atherosclerotic heart disease of native coronary artery without angina pectoris: Secondary | ICD-10-CM | POA: Diagnosis not present

## 2020-07-19 DIAGNOSIS — I739 Peripheral vascular disease, unspecified: Secondary | ICD-10-CM | POA: Diagnosis not present

## 2020-08-06 DIAGNOSIS — K5090 Crohn's disease, unspecified, without complications: Secondary | ICD-10-CM | POA: Diagnosis not present

## 2020-09-23 DIAGNOSIS — M609 Myositis, unspecified: Secondary | ICD-10-CM | POA: Diagnosis not present

## 2020-09-23 DIAGNOSIS — R634 Abnormal weight loss: Secondary | ICD-10-CM | POA: Diagnosis not present

## 2020-09-23 DIAGNOSIS — K5090 Crohn's disease, unspecified, without complications: Secondary | ICD-10-CM | POA: Diagnosis not present

## 2020-09-30 DIAGNOSIS — F17200 Nicotine dependence, unspecified, uncomplicated: Secondary | ICD-10-CM | POA: Diagnosis not present

## 2020-09-30 DIAGNOSIS — R0602 Shortness of breath: Secondary | ICD-10-CM | POA: Diagnosis not present

## 2020-09-30 DIAGNOSIS — M5416 Radiculopathy, lumbar region: Secondary | ICD-10-CM | POA: Diagnosis not present

## 2020-09-30 DIAGNOSIS — Z9861 Coronary angioplasty status: Secondary | ICD-10-CM | POA: Diagnosis not present

## 2020-09-30 DIAGNOSIS — F17210 Nicotine dependence, cigarettes, uncomplicated: Secondary | ICD-10-CM | POA: Diagnosis not present

## 2020-09-30 DIAGNOSIS — I2510 Atherosclerotic heart disease of native coronary artery without angina pectoris: Secondary | ICD-10-CM | POA: Diagnosis not present

## 2020-09-30 DIAGNOSIS — I10 Essential (primary) hypertension: Secondary | ICD-10-CM | POA: Diagnosis not present

## 2020-09-30 DIAGNOSIS — R197 Diarrhea, unspecified: Secondary | ICD-10-CM | POA: Diagnosis not present

## 2020-10-01 DIAGNOSIS — Z79899 Other long term (current) drug therapy: Secondary | ICD-10-CM | POA: Diagnosis not present

## 2020-10-01 DIAGNOSIS — K5090 Crohn's disease, unspecified, without complications: Secondary | ICD-10-CM | POA: Diagnosis not present

## 2020-11-25 DIAGNOSIS — R0602 Shortness of breath: Secondary | ICD-10-CM | POA: Diagnosis not present

## 2020-11-25 DIAGNOSIS — E782 Mixed hyperlipidemia: Secondary | ICD-10-CM | POA: Diagnosis not present

## 2020-11-25 DIAGNOSIS — K5090 Crohn's disease, unspecified, without complications: Secondary | ICD-10-CM | POA: Diagnosis not present

## 2020-11-25 DIAGNOSIS — I2510 Atherosclerotic heart disease of native coronary artery without angina pectoris: Secondary | ICD-10-CM | POA: Diagnosis not present

## 2020-11-25 DIAGNOSIS — F17200 Nicotine dependence, unspecified, uncomplicated: Secondary | ICD-10-CM | POA: Diagnosis not present

## 2020-11-25 DIAGNOSIS — Z9861 Coronary angioplasty status: Secondary | ICD-10-CM | POA: Diagnosis not present

## 2020-11-25 DIAGNOSIS — I10 Essential (primary) hypertension: Secondary | ICD-10-CM | POA: Diagnosis not present

## 2020-11-25 DIAGNOSIS — M5416 Radiculopathy, lumbar region: Secondary | ICD-10-CM | POA: Diagnosis not present

## 2020-11-27 DIAGNOSIS — I2510 Atherosclerotic heart disease of native coronary artery without angina pectoris: Secondary | ICD-10-CM | POA: Diagnosis not present

## 2020-11-27 DIAGNOSIS — R748 Abnormal levels of other serum enzymes: Secondary | ICD-10-CM | POA: Diagnosis not present

## 2020-11-27 DIAGNOSIS — M609 Myositis, unspecified: Secondary | ICD-10-CM | POA: Diagnosis not present

## 2020-11-27 DIAGNOSIS — K5090 Crohn's disease, unspecified, without complications: Secondary | ICD-10-CM | POA: Diagnosis not present

## 2020-11-27 DIAGNOSIS — Z79899 Other long term (current) drug therapy: Secondary | ICD-10-CM | POA: Diagnosis not present

## 2020-12-23 DIAGNOSIS — I2510 Atherosclerotic heart disease of native coronary artery without angina pectoris: Secondary | ICD-10-CM | POA: Diagnosis not present

## 2020-12-23 DIAGNOSIS — Z9861 Coronary angioplasty status: Secondary | ICD-10-CM | POA: Diagnosis not present

## 2020-12-23 DIAGNOSIS — I10 Essential (primary) hypertension: Secondary | ICD-10-CM | POA: Diagnosis not present

## 2020-12-23 DIAGNOSIS — K5090 Crohn's disease, unspecified, without complications: Secondary | ICD-10-CM | POA: Diagnosis not present

## 2020-12-23 DIAGNOSIS — R0602 Shortness of breath: Secondary | ICD-10-CM | POA: Diagnosis not present

## 2020-12-23 DIAGNOSIS — F17210 Nicotine dependence, cigarettes, uncomplicated: Secondary | ICD-10-CM | POA: Diagnosis not present

## 2020-12-30 DIAGNOSIS — K5090 Crohn's disease, unspecified, without complications: Secondary | ICD-10-CM | POA: Diagnosis not present

## 2021-01-29 ENCOUNTER — Encounter (HOSPITAL_COMMUNITY): Payer: Self-pay | Admitting: Emergency Medicine

## 2021-01-29 ENCOUNTER — Emergency Department (HOSPITAL_COMMUNITY): Payer: Medicare Other

## 2021-01-29 ENCOUNTER — Emergency Department (HOSPITAL_COMMUNITY)
Admission: EM | Admit: 2021-01-29 | Discharge: 2021-01-29 | Disposition: A | Discharge status: Home / Self Care | Payer: Medicare Other | Attending: Emergency Medicine | Admitting: Emergency Medicine

## 2021-01-29 DIAGNOSIS — E876 Hypokalemia: Secondary | ICD-10-CM | POA: Diagnosis not present

## 2021-01-29 DIAGNOSIS — Z87891 Personal history of nicotine dependence: Secondary | ICD-10-CM | POA: Insufficient documentation

## 2021-01-29 DIAGNOSIS — M79602 Pain in left arm: Secondary | ICD-10-CM | POA: Diagnosis not present

## 2021-01-29 DIAGNOSIS — I10 Essential (primary) hypertension: Secondary | ICD-10-CM | POA: Insufficient documentation

## 2021-01-29 DIAGNOSIS — Z79899 Other long term (current) drug therapy: Secondary | ICD-10-CM | POA: Insufficient documentation

## 2021-01-29 DIAGNOSIS — R531 Weakness: Secondary | ICD-10-CM | POA: Diagnosis not present

## 2021-01-29 DIAGNOSIS — M79605 Pain in left leg: Secondary | ICD-10-CM | POA: Diagnosis not present

## 2021-01-29 DIAGNOSIS — I2510 Atherosclerotic heart disease of native coronary artery without angina pectoris: Secondary | ICD-10-CM | POA: Diagnosis not present

## 2021-01-29 LAB — CBC
HCT: 40.1 % (ref 36.0–46.0)
Hemoglobin: 13.3 g/dL (ref 12.0–15.0)
MCH: 32 pg (ref 26.0–34.0)
MCHC: 33.2 g/dL (ref 30.0–36.0)
MCV: 96.6 fL (ref 80.0–100.0)
Platelets: 460 10*3/uL — ABNORMAL HIGH (ref 150–400)
RBC: 4.15 MIL/uL (ref 3.87–5.11)
RDW: 15.3 % (ref 11.5–15.5)
WBC: 9.4 10*3/uL (ref 4.0–10.5)
nRBC: 0 % (ref 0.0–0.2)

## 2021-01-29 LAB — BASIC METABOLIC PANEL
Anion gap: 7 (ref 5–15)
BUN: 22 mg/dL (ref 8–23)
CO2: 20 mmol/L — ABNORMAL LOW (ref 22–32)
Calcium: 9.1 mg/dL (ref 8.9–10.3)
Chloride: 112 mmol/L — ABNORMAL HIGH (ref 98–111)
Creatinine, Ser: 1.52 mg/dL — ABNORMAL HIGH (ref 0.44–1.00)
GFR, Estimated: 37 mL/min — ABNORMAL LOW (ref >60)
Glucose, Bld: 93 mg/dL (ref 70–99)
Potassium: 3 mmol/L — ABNORMAL LOW (ref 3.5–5.1)
Sodium: 139 mmol/L (ref 135–145)

## 2021-01-29 LAB — TROPONIN I (HIGH SENSITIVITY): Troponin I (High Sensitivity): 6 ng/L (ref <18)

## 2021-01-29 MED ORDER — ACETAMINOPHEN 500 MG PO TABS
1000.0000 mg | ORAL_TABLET | Freq: Once | ORAL | Status: DC
  Filled 2021-01-29: qty 2

## 2021-01-29 MED ORDER — POTASSIUM CHLORIDE CRYS ER 20 MEQ PO TBCR: 20.0000 meq | EXTENDED_RELEASE_TABLET | Freq: Every day | ORAL | 0 refills | Status: DC

## 2021-01-29 MED ORDER — POTASSIUM CHLORIDE CRYS ER 20 MEQ PO TBCR
40.0000 meq | EXTENDED_RELEASE_TABLET | Freq: Once | ORAL | Status: AC
  Administered 2021-01-29: 40 meq via ORAL
  Filled 2021-01-29: qty 2

## 2021-01-29 NOTE — Discharge Instructions (Addendum)
You have been evaluated for your left arm and left leg pain.  This is unrelated to your heart disease.  Your potassium level is low today, please take supplementation prescribed follow-up closely with your doctor for recheck.  You also have evidence of impaired kidney function, discussed this with your doctor for recheck.

## 2021-01-29 NOTE — ED Provider Notes (Signed)
[facility] EMERGENCY DEPARTMENT Provider Note   CSN: [id number] Arrival date & time: 01/29/21  1253     History Chief Complaint  Patient presents with   Extremity Pain    Anne [name] is a 65 y.o. female.  The history is provided by the patient and medical records. No language interpreter was used.  Extremity Pain   65 year old female significant history of CAD, Crohn's disease, generalized weakness who presents for evaluation of left arm and left leg pain.  Patient reports she has had constant pain to her left arm and left leg since January of this year.  She described pain as a sharp sensation, started in her left elbow radiates up towards her left shoulder that is the on a regular basis.  She also has pain throughout her left leg present at rest and with movement.  Rates pain as 10 out of 10 and happen on a daily basis.  No associated fever chest pain shortness of breath productive cough focal numbness or focal weakness.  No significant neck pain.  Does have history of cardiac disease status post stent.  She is here today at the urging of her sister who was concerned that she has left arm pain and has history of heart problem.  No specific treatment tried at home aside from taking her medication for Crohn's disease which include Tylenol #4.   Past Medical History:  Diagnosis Date   Coronary artery disease    Crohn's colitis ([place])    Hypokalemia 08/04/2019   STEMI (ST elevation myocardial infarction) ([place])    10/27/17 PCI/DES x1 to the dRCA, with residual thrombus in the PLA, normal EF    Patient Active Problem List   Diagnosis Date Noted   Mixed hyperlipidemia 02/02/2020   Essential hypertension 02/02/2020   Abnormal LFTs 02/02/2020   Acute diarrhea 02/02/2020   Lactic acidosis 02/01/2020   Crohn's disease of colon with complication ([place]) 40/34/7425   Chronic diarrhea 02/01/2020   Coronary artery disease involving native coronary artery of native heart  without angina pectoris 02/01/2020   Severe protein-calorie malnutrition ([place]) 08/08/2019   Hypokalemia 08/04/2019   AKI (acute kidney injury) ([place])    Generalized weakness    Prolonged Q-T interval on ECG    Coronary syndrome, acute ([place]) 04/25/2018   STEMI (ST elevation myocardial infarction) ([place]) 10/26/2017   STEMI involving right coronary artery ([place]) 10/26/2017    Past Surgical History:  Procedure Laterality Date   BIOPSY  09/22/2019   Procedure: BIOPSY;  Surgeon: Carol Ada, MD;  Location: [facility];  Service: Endoscopy;;   COLONOSCOPY WITH PROPOFOL N/A 09/22/2019   Procedure: COLONOSCOPY WITH PROPOFOL;  Surgeon: Carol Ada, MD;  [facility]: [facility];  Service: Endoscopy;  Laterality: N/A;   CORONARY/GRAFT ACUTE MI REVASCULARIZATION N/A 10/26/2017   Procedure: Coronary/Graft Acute MI Revascularization;  Surgeon: Troy Sine, MD;  Location: [place] [facility];  Service: Cardiovascular;  Laterality: N/A;   LEFT HEART CATH AND CORONARY ANGIOGRAPHY N/A 10/26/2017   Procedure: LEFT HEART CATH AND CORONARY ANGIOGRAPHY;  Surgeon: Troy Sine, MD;  Location: [place] [facility];  Service: Cardiovascular;  Laterality: N/A;   LEFT HEART CATH AND CORONARY ANGIOGRAPHY Left 05/05/2018   Procedure: Left heart cath and coronary angiography;  Surgeon: Dionisio David, MD;  Location: [place] [facility];  Service: Cardiovascular;  Laterality: Left;   MUSCLE BIOPSY Left 08/10/2019   Procedure: THIGH MUSCLE BIOPSY;  Surgeon: Coralie Keens, MD;  Location: [place];  Service: General;  Laterality: Left;   SMALL INTESTINE SURGERY       OB History   No obstetric history on file.     Family History  Problem Relation Age of Onset   Crohn's disease Neg Hx     Social History   Tobacco Use   Smoking status: Former    Packs/day: 0.25    Years: 45.00    Pack years: 11.25    Types: Cigarettes   Smokeless tobacco: Never  Vaping Use   Vaping Use: Never used  Substance Use Topics    Alcohol use: No   Drug use: No    Home Medications Prior to Admission medications   Medication Sig Start Date End Date Taking? Authorizing Provider  BRILINTA 60 MG TABS tablet Take 60 mg by mouth 2 (two) times daily. 05/26/19   [provider]  cholecalciferol (VITAMIN D3) 10 MCG (400 UNIT) TABS tablet Take 1 tablet (400 Units total) by mouth daily. 02/07/20   Geradine Girt, DO  cholestyramine (QUESTRAN) 4 g packet Take 4 g by mouth 2 (two) times daily.    [provider]  cyclobenzaprine (FLEXERIL) 10 MG tablet Take 10 mg by mouth at bedtime.  08/09/19   [provider]  l-methylfolate-B6-B12 (METANX) 3-35-2 MG TABS tablet Take 1 tablet by mouth daily. 02/07/20   Geradine Girt, DO  midodrine (PROAMATINE) 5 MG tablet Take 1 tablet (5 mg total) by mouth 3 (three) times daily with meals. 02/06/20   Geradine Girt, DO  nitroGLYCERIN (NITROSTAT) 0.4 MG SL tablet Place 1 tablet (0.4 mg total) under the tongue every 5 (five) minutes x 3 doses as needed for chest pain. 10/28/17   Cheryln Manly, NP  rosuvastatin (CRESTOR) 40 MG tablet Take 1 tablet (40 mg total) by mouth daily. 02/20/20   Geradine Girt, DO  sodium bicarbonate 650 MG tablet Take 3 tablets (1,950 mg total) by mouth 2 (two) times daily. 08/12/19   Asencion Noble, MD  temazepam (RESTORIL) 7.5 MG capsule Take 1 capsule (7.5 mg total) by mouth at bedtime. 02/06/20   Geradine Girt, DO    Allergies    Patient has no known allergies.  Review of Systems   Review of Systems  All other systems reviewed and are negative.  Physical Exam Updated Vital Signs BP 91/75 (BP Location: Left Arm)   Pulse 92   Temp 98.5 F (36.9 C) (Oral)   Resp 16   LMP 02/18/2001   SpO2 99%   Physical Exam Vitals and nursing note reviewed.  Constitutional:      General: She is not in acute distress.    Appearance: She is well-developed.  HENT:     Head: Atraumatic.  Eyes:     Conjunctiva/sclera: Conjunctivae  normal.  Cardiovascular:     Rate and Rhythm: Normal rate and regular rhythm.     Pulses: Normal pulses.     Heart sounds: Normal heart sounds.  Pulmonary:     Effort: Pulmonary effort is normal.     Breath sounds: No wheezing, rhonchi or rales.  Abdominal:     Palpations: Abdomen is soft.     Tenderness: There is no abdominal tenderness.  Musculoskeletal:     Cervical back: Neck supple.     Right lower leg: No edema.     Left lower leg: No edema.     Comments: No reproducible tenderness to palpation of the left arm and left leg.  Intact  radial pulse and intact dorsalis pedis pulse.  Left arm left leg with full range of motion  Skin:    Findings: No rash.  Neurological:     Mental Status: She is alert. Mental status is at baseline.  Psychiatric:        Mood and Affect: Mood normal.    ED Results / Procedures / Treatments   Labs (all labs ordered are listed, but only abnormal results are displayed) Labs Reviewed  BASIC METABOLIC PANEL - Abnormal; Notable for the following components:      Result Value   Potassium 3.0 (*)    Chloride 112 (*)    CO2 20 (*)    Creatinine, Ser 1.52 (*)    GFR, Estimated 37 (*)    All other components within normal limits  CBC - Abnormal; Notable for the following components:   Platelets 460 (*)    All other components within normal limits  TROPONIN I (HIGH SENSITIVITY)    EKG EKG Interpretation  Date/Time:  Wednesday January 29 2021 14:20:30 EDT Ventricular Rate:  63 PR Interval:  124 QRS Duration: 82 QT Interval:  436 QTC Calculation: 446 R Axis:   60 Text Interpretation: Normal sinus rhythm Nonspecific T wave abnormality Abnormal ECG Confirmed by Thamas Jaegers ([id number]) on 01/29/2021 2:26:54 PM  Radiology DG Hip Unilat W or Wo Pelvis 2-3 Views Left  Result Date: 01/29/2021 CLINICAL DATA:  Left leg pain EXAM: DG HIP (WITH OR WITHOUT PELVIS) 2-3V LEFT COMPARISON:  02/19/2017 FINDINGS: There is no evidence of hip fracture or  dislocation. There is no evidence of arthropathy or other focal bone abnormality. Stable bone island left ischial tuberosity. IMPRESSION: Negative. Electronically Signed   By: Franchot Gallo M.D.   On: 01/29/2021 14:07    Procedures Procedures   Medications Ordered in ED Medications  acetaminophen (TYLENOL) tablet 1,000 mg (has no administration in time range)  potassium chloride SA (KLOR-CON) CR tablet 40 mEq (has no administration in time range)    ED Course  I have reviewed the triage vital signs and the nursing notes.  Pertinent labs & imaging results that were available during my care of the patient were reviewed by me and considered in my medical decision making (see chart for details).    MDM Rules/Calculators/A&P                           BP 91/75 (BP Location: Left Arm)   Pulse 92   Temp 98.5 F (36.9 C) (Oral)   Resp 16   LMP 02/18/2001   SpO2 99%   Final Clinical Impression(s) / ED Diagnoses Final diagnoses:  Left arm pain  Left leg pain  Hypokalemia    Rx / DC Orders ED Discharge Orders          Ordered    potassium chloride SA (KLOR-CON) 20 MEQ tablet  Daily        01/29/21 1523           Patient here with constant left arm left leg pain for nearly a year.  No significant reproducible pain on my exam and no signs of infection.  She is neurovascular intact.  She is here because she does have significant history of cardiac disease and at the urging of her sister she would like to be evaluated.  She does not endorse any active chest pain or shortness of breath.  She is resting comfortably.  Given her age and  risk factor, screening cardiac work-up initiated.  Care discussed with Dr. Almyra Free.   2:30 PM EKG without concerning changes, an x-ray of the left hip was ordered and negative.  Labs remarkable for potassium of 3.0, will provide supplementation.  Creatinine is 1.52, impaired but similar to prior.  I discussed this plan with patient and recommend  outpatient follow-up.  Patient otherwise stable for discharge.  Troponin negative   Domenic Moras, PA-C 01/29/21 [place], MD 01/31/21 [phone]

## 2021-01-29 NOTE — ED Notes (Signed)
Pt states the her shoulder is hurting to much to do an x-ray Provider aware

## 2021-01-29 NOTE — ED Triage Notes (Signed)
Patient complains of constant 10/10 left arm and left leg pain that started in January 2022. Patient states she has not had pain evaluated before, denies any known injury that may have caused the pain, and denies anything that alleviates the pain.

## 2021-01-29 NOTE — ED Provider Notes (Signed)
Emergency Medicine Provider Triage Evaluation Note  Anne [name] , a 65 y.o. female  was evaluated in triage.  Pt complains of constant pain to left arm and left leg.  Pain has been constant since January 2022.  Leg pain is worse with standing, pain is located to lateral aspect from left hip to left ankle.  Left arm pain is located from left shoulder to left elbow.  Denies any recent falls or injuries.  No change in pain over this time.  Review of Systems  Positive: Myalgia, arthralgia Negative: Numbness, weakness, gait abnormality, neck pain, chest pain, shortness of breath  Physical Exam  LMP 02/18/2001  Gen:   Awake, no distress   Resp:  Normal effort  MSK:   Moves extremities without difficulty  Other:    Medical Decision Making  Medically screening exam initiated at 1:11 PM.  Appropriate orders placed.  Anne [name] was informed that the remainder of the evaluation will be completed by another provider, this initial triage assessment does not replace that evaluation, and the importance of remaining in the ED until their evaluation is complete.  Left arm and left leg pain   Dyann Ruddle 01/29/21 1312    Blanchie Dessert, MD 02/05/21 1546

## 2021-01-29 NOTE — ED Notes (Signed)
Pt upset with wait refusing discharge vital signs at this time. Pt does not appear in distress, respirations are even and non-labored  Pt able to ambulate out of department independently

## 2021-02-24 DIAGNOSIS — M2550 Pain in unspecified joint: Secondary | ICD-10-CM | POA: Diagnosis not present

## 2021-02-24 DIAGNOSIS — Z79899 Other long term (current) drug therapy: Secondary | ICD-10-CM | POA: Diagnosis not present

## 2021-02-24 DIAGNOSIS — M5430 Sciatica, unspecified side: Secondary | ICD-10-CM | POA: Diagnosis not present

## 2021-02-24 DIAGNOSIS — M1990 Unspecified osteoarthritis, unspecified site: Secondary | ICD-10-CM | POA: Diagnosis not present

## 2021-02-24 DIAGNOSIS — M810 Age-related osteoporosis without current pathological fracture: Secondary | ICD-10-CM | POA: Diagnosis not present

## 2021-02-24 DIAGNOSIS — M549 Dorsalgia, unspecified: Secondary | ICD-10-CM | POA: Diagnosis not present

## 2021-02-24 DIAGNOSIS — K5090 Crohn's disease, unspecified, without complications: Secondary | ICD-10-CM | POA: Diagnosis not present

## 2021-03-24 DIAGNOSIS — M609 Myositis, unspecified: Secondary | ICD-10-CM | POA: Diagnosis not present

## 2021-03-24 DIAGNOSIS — K5090 Crohn's disease, unspecified, without complications: Secondary | ICD-10-CM | POA: Diagnosis not present

## 2021-03-31 DIAGNOSIS — F17200 Nicotine dependence, unspecified, uncomplicated: Secondary | ICD-10-CM | POA: Diagnosis not present

## 2021-03-31 DIAGNOSIS — I340 Nonrheumatic mitral (valve) insufficiency: Secondary | ICD-10-CM | POA: Diagnosis not present

## 2021-03-31 DIAGNOSIS — E782 Mixed hyperlipidemia: Secondary | ICD-10-CM | POA: Diagnosis not present

## 2021-03-31 DIAGNOSIS — I10 Essential (primary) hypertension: Secondary | ICD-10-CM | POA: Diagnosis not present

## 2021-03-31 DIAGNOSIS — R0602 Shortness of breath: Secondary | ICD-10-CM | POA: Diagnosis not present

## 2021-03-31 DIAGNOSIS — I2510 Atherosclerotic heart disease of native coronary artery without angina pectoris: Secondary | ICD-10-CM | POA: Diagnosis not present

## 2021-03-31 DIAGNOSIS — I351 Nonrheumatic aortic (valve) insufficiency: Secondary | ICD-10-CM | POA: Diagnosis not present

## 2021-04-08 DIAGNOSIS — I2510 Atherosclerotic heart disease of native coronary artery without angina pectoris: Secondary | ICD-10-CM | POA: Diagnosis not present

## 2021-04-11 DIAGNOSIS — R0609 Other forms of dyspnea: Secondary | ICD-10-CM | POA: Diagnosis not present

## 2021-04-16 DIAGNOSIS — K5090 Crohn's disease, unspecified, without complications: Secondary | ICD-10-CM | POA: Diagnosis not present

## 2021-04-16 DIAGNOSIS — I10 Essential (primary) hypertension: Secondary | ICD-10-CM | POA: Diagnosis not present

## 2021-04-16 DIAGNOSIS — F17210 Nicotine dependence, cigarettes, uncomplicated: Secondary | ICD-10-CM | POA: Diagnosis not present

## 2021-04-16 DIAGNOSIS — R197 Diarrhea, unspecified: Secondary | ICD-10-CM | POA: Diagnosis not present

## 2021-04-16 DIAGNOSIS — Z9861 Coronary angioplasty status: Secondary | ICD-10-CM | POA: Diagnosis not present

## 2021-04-16 DIAGNOSIS — R0609 Other forms of dyspnea: Secondary | ICD-10-CM | POA: Diagnosis not present

## 2021-04-16 DIAGNOSIS — I2510 Atherosclerotic heart disease of native coronary artery without angina pectoris: Secondary | ICD-10-CM | POA: Diagnosis not present

## 2021-04-16 DIAGNOSIS — R0602 Shortness of breath: Secondary | ICD-10-CM | POA: Diagnosis not present

## 2021-04-16 DIAGNOSIS — N183 Chronic kidney disease, stage 3 unspecified: Secondary | ICD-10-CM | POA: Diagnosis not present

## 2021-04-16 DIAGNOSIS — L723 Sebaceous cyst: Secondary | ICD-10-CM | POA: Diagnosis not present

## 2021-04-24 DIAGNOSIS — Z780 Asymptomatic menopausal state: Secondary | ICD-10-CM | POA: Diagnosis not present

## 2021-04-24 DIAGNOSIS — Z1231 Encounter for screening mammogram for malignant neoplasm of breast: Secondary | ICD-10-CM | POA: Diagnosis not present

## 2021-04-30 DIAGNOSIS — F17200 Nicotine dependence, unspecified, uncomplicated: Secondary | ICD-10-CM | POA: Diagnosis not present

## 2021-04-30 DIAGNOSIS — I739 Peripheral vascular disease, unspecified: Secondary | ICD-10-CM | POA: Diagnosis not present

## 2021-04-30 DIAGNOSIS — E782 Mixed hyperlipidemia: Secondary | ICD-10-CM | POA: Diagnosis not present

## 2021-04-30 DIAGNOSIS — Z9861 Coronary angioplasty status: Secondary | ICD-10-CM | POA: Diagnosis not present

## 2021-04-30 DIAGNOSIS — I10 Essential (primary) hypertension: Secondary | ICD-10-CM | POA: Diagnosis not present

## 2021-04-30 DIAGNOSIS — I340 Nonrheumatic mitral (valve) insufficiency: Secondary | ICD-10-CM | POA: Diagnosis not present

## 2021-04-30 DIAGNOSIS — I2510 Atherosclerotic heart disease of native coronary artery without angina pectoris: Secondary | ICD-10-CM | POA: Diagnosis not present

## 2021-05-08 DIAGNOSIS — K5090 Crohn's disease, unspecified, without complications: Secondary | ICD-10-CM | POA: Diagnosis not present

## 2021-07-10 DIAGNOSIS — K5090 Crohn's disease, unspecified, without complications: Secondary | ICD-10-CM | POA: Diagnosis not present

## 2021-07-29 DIAGNOSIS — N183 Chronic kidney disease, stage 3 unspecified: Secondary | ICD-10-CM | POA: Diagnosis not present

## 2021-07-29 DIAGNOSIS — I2510 Atherosclerotic heart disease of native coronary artery without angina pectoris: Secondary | ICD-10-CM | POA: Diagnosis not present

## 2021-07-31 DIAGNOSIS — R0602 Shortness of breath: Secondary | ICD-10-CM | POA: Diagnosis not present

## 2021-07-31 DIAGNOSIS — F17210 Nicotine dependence, cigarettes, uncomplicated: Secondary | ICD-10-CM | POA: Diagnosis not present

## 2021-07-31 DIAGNOSIS — R079 Chest pain, unspecified: Secondary | ICD-10-CM | POA: Diagnosis not present

## 2021-07-31 DIAGNOSIS — I739 Peripheral vascular disease, unspecified: Secondary | ICD-10-CM | POA: Diagnosis not present

## 2021-07-31 DIAGNOSIS — I10 Essential (primary) hypertension: Secondary | ICD-10-CM | POA: Diagnosis not present

## 2021-07-31 DIAGNOSIS — E782 Mixed hyperlipidemia: Secondary | ICD-10-CM | POA: Diagnosis not present

## 2021-07-31 DIAGNOSIS — I219 Acute myocardial infarction, unspecified: Secondary | ICD-10-CM | POA: Diagnosis not present

## 2021-07-31 DIAGNOSIS — I340 Nonrheumatic mitral (valve) insufficiency: Secondary | ICD-10-CM | POA: Diagnosis not present

## 2021-07-31 DIAGNOSIS — I2510 Atherosclerotic heart disease of native coronary artery without angina pectoris: Secondary | ICD-10-CM | POA: Diagnosis not present

## 2021-07-31 DIAGNOSIS — I351 Nonrheumatic aortic (valve) insufficiency: Secondary | ICD-10-CM | POA: Diagnosis not present

## 2021-09-05 DIAGNOSIS — R0602 Shortness of breath: Secondary | ICD-10-CM | POA: Diagnosis not present

## 2021-09-05 DIAGNOSIS — L723 Sebaceous cyst: Secondary | ICD-10-CM | POA: Diagnosis not present

## 2021-09-05 DIAGNOSIS — R197 Diarrhea, unspecified: Secondary | ICD-10-CM | POA: Diagnosis not present

## 2021-09-05 DIAGNOSIS — Z9861 Coronary angioplasty status: Secondary | ICD-10-CM | POA: Diagnosis not present

## 2021-09-05 DIAGNOSIS — F17200 Nicotine dependence, unspecified, uncomplicated: Secondary | ICD-10-CM | POA: Diagnosis not present

## 2021-09-05 DIAGNOSIS — M5416 Radiculopathy, lumbar region: Secondary | ICD-10-CM | POA: Diagnosis not present

## 2021-09-05 DIAGNOSIS — K5090 Crohn's disease, unspecified, without complications: Secondary | ICD-10-CM | POA: Diagnosis not present

## 2021-09-05 DIAGNOSIS — N183 Chronic kidney disease, stage 3 unspecified: Secondary | ICD-10-CM | POA: Diagnosis not present

## 2021-09-05 DIAGNOSIS — I2510 Atherosclerotic heart disease of native coronary artery without angina pectoris: Secondary | ICD-10-CM | POA: Diagnosis not present

## 2021-09-05 DIAGNOSIS — I10 Essential (primary) hypertension: Secondary | ICD-10-CM | POA: Diagnosis not present

## 2021-09-17 DIAGNOSIS — K5090 Crohn's disease, unspecified, without complications: Secondary | ICD-10-CM | POA: Diagnosis not present

## 2021-09-17 DIAGNOSIS — I10 Essential (primary) hypertension: Secondary | ICD-10-CM | POA: Diagnosis not present

## 2021-09-17 DIAGNOSIS — R0602 Shortness of breath: Secondary | ICD-10-CM | POA: Diagnosis not present

## 2021-09-17 DIAGNOSIS — I2510 Atherosclerotic heart disease of native coronary artery without angina pectoris: Secondary | ICD-10-CM | POA: Diagnosis not present

## 2021-09-17 DIAGNOSIS — R197 Diarrhea, unspecified: Secondary | ICD-10-CM | POA: Diagnosis not present

## 2021-09-17 DIAGNOSIS — F17200 Nicotine dependence, unspecified, uncomplicated: Secondary | ICD-10-CM | POA: Diagnosis not present

## 2021-09-17 DIAGNOSIS — L723 Sebaceous cyst: Secondary | ICD-10-CM | POA: Diagnosis not present

## 2021-09-17 DIAGNOSIS — N183 Chronic kidney disease, stage 3 unspecified: Secondary | ICD-10-CM | POA: Diagnosis not present

## 2021-09-17 DIAGNOSIS — M5416 Radiculopathy, lumbar region: Secondary | ICD-10-CM | POA: Diagnosis not present

## 2021-09-17 DIAGNOSIS — Z9861 Coronary angioplasty status: Secondary | ICD-10-CM | POA: Diagnosis not present

## 2021-10-01 ENCOUNTER — Other Ambulatory Visit: Payer: Self-pay | Admitting: Gastroenterology

## 2021-10-01 DIAGNOSIS — K5090 Crohn's disease, unspecified, without complications: Secondary | ICD-10-CM | POA: Diagnosis not present

## 2021-10-02 ENCOUNTER — Ambulatory Visit: Payer: BLUE CROSS/BLUE SHIELD | Admitting: Dermatology

## 2021-10-02 DIAGNOSIS — M5416 Radiculopathy, lumbar region: Secondary | ICD-10-CM | POA: Diagnosis not present

## 2021-10-02 DIAGNOSIS — M5412 Radiculopathy, cervical region: Secondary | ICD-10-CM | POA: Diagnosis not present

## 2021-10-08 DIAGNOSIS — M542 Cervicalgia: Secondary | ICD-10-CM | POA: Diagnosis not present

## 2021-10-08 DIAGNOSIS — M545 Low back pain, unspecified: Secondary | ICD-10-CM | POA: Diagnosis not present

## 2021-10-08 DIAGNOSIS — M5412 Radiculopathy, cervical region: Secondary | ICD-10-CM | POA: Diagnosis not present

## 2021-10-08 DIAGNOSIS — M5416 Radiculopathy, lumbar region: Secondary | ICD-10-CM | POA: Diagnosis not present

## 2021-10-10 DIAGNOSIS — K5090 Crohn's disease, unspecified, without complications: Secondary | ICD-10-CM | POA: Diagnosis not present

## 2021-10-30 DIAGNOSIS — M5412 Radiculopathy, cervical region: Secondary | ICD-10-CM | POA: Diagnosis not present

## 2021-10-31 DIAGNOSIS — E782 Mixed hyperlipidemia: Secondary | ICD-10-CM | POA: Diagnosis not present

## 2021-10-31 DIAGNOSIS — F17200 Nicotine dependence, unspecified, uncomplicated: Secondary | ICD-10-CM | POA: Diagnosis not present

## 2021-10-31 DIAGNOSIS — I10 Essential (primary) hypertension: Secondary | ICD-10-CM | POA: Diagnosis not present

## 2021-10-31 DIAGNOSIS — I351 Nonrheumatic aortic (valve) insufficiency: Secondary | ICD-10-CM | POA: Diagnosis not present

## 2021-10-31 DIAGNOSIS — R0602 Shortness of breath: Secondary | ICD-10-CM | POA: Diagnosis not present

## 2021-10-31 DIAGNOSIS — Z9861 Coronary angioplasty status: Secondary | ICD-10-CM | POA: Diagnosis not present

## 2021-10-31 DIAGNOSIS — I340 Nonrheumatic mitral (valve) insufficiency: Secondary | ICD-10-CM | POA: Diagnosis not present

## 2021-10-31 DIAGNOSIS — I219 Acute myocardial infarction, unspecified: Secondary | ICD-10-CM | POA: Diagnosis not present

## 2021-10-31 DIAGNOSIS — I2510 Atherosclerotic heart disease of native coronary artery without angina pectoris: Secondary | ICD-10-CM | POA: Diagnosis not present

## 2021-10-31 DIAGNOSIS — I739 Peripheral vascular disease, unspecified: Secondary | ICD-10-CM | POA: Diagnosis not present

## 2021-11-05 ENCOUNTER — Other Ambulatory Visit: Payer: Self-pay | Admitting: Neurological Surgery

## 2021-11-27 DIAGNOSIS — M5030 Other cervical disc degeneration, unspecified cervical region: Secondary | ICD-10-CM | POA: Diagnosis not present

## 2021-12-05 ENCOUNTER — Encounter (HOSPITAL_COMMUNITY): Payer: Self-pay | Admitting: Gastroenterology

## 2021-12-05 DIAGNOSIS — R0602 Shortness of breath: Secondary | ICD-10-CM | POA: Diagnosis not present

## 2021-12-05 DIAGNOSIS — I2510 Atherosclerotic heart disease of native coronary artery without angina pectoris: Secondary | ICD-10-CM | POA: Diagnosis not present

## 2021-12-05 DIAGNOSIS — N183 Chronic kidney disease, stage 3 unspecified: Secondary | ICD-10-CM | POA: Diagnosis not present

## 2021-12-05 DIAGNOSIS — I10 Essential (primary) hypertension: Secondary | ICD-10-CM | POA: Diagnosis not present

## 2021-12-05 DIAGNOSIS — Z9861 Coronary angioplasty status: Secondary | ICD-10-CM | POA: Diagnosis not present

## 2021-12-05 DIAGNOSIS — K5090 Crohn's disease, unspecified, without complications: Secondary | ICD-10-CM | POA: Diagnosis not present

## 2021-12-05 DIAGNOSIS — L723 Sebaceous cyst: Secondary | ICD-10-CM | POA: Diagnosis not present

## 2021-12-05 DIAGNOSIS — F17210 Nicotine dependence, cigarettes, uncomplicated: Secondary | ICD-10-CM | POA: Diagnosis not present

## 2021-12-11 DIAGNOSIS — R748 Abnormal levels of other serum enzymes: Secondary | ICD-10-CM | POA: Diagnosis not present

## 2021-12-11 DIAGNOSIS — M2550 Pain in unspecified joint: Secondary | ICD-10-CM | POA: Diagnosis not present

## 2021-12-11 DIAGNOSIS — M1990 Unspecified osteoarthritis, unspecified site: Secondary | ICD-10-CM | POA: Diagnosis not present

## 2021-12-11 DIAGNOSIS — K5090 Crohn's disease, unspecified, without complications: Secondary | ICD-10-CM | POA: Diagnosis not present

## 2021-12-11 DIAGNOSIS — M549 Dorsalgia, unspecified: Secondary | ICD-10-CM | POA: Diagnosis not present

## 2021-12-11 DIAGNOSIS — Z79899 Other long term (current) drug therapy: Secondary | ICD-10-CM | POA: Diagnosis not present

## 2021-12-11 DIAGNOSIS — M609 Myositis, unspecified: Secondary | ICD-10-CM | POA: Diagnosis not present

## 2021-12-12 ENCOUNTER — Ambulatory Visit (HOSPITAL_COMMUNITY)
Admission: RE | Admit: 2021-12-12 | Discharge: 2021-12-12 | Disposition: A | Discharge status: Home / Self Care | Payer: Medicare Other | Attending: Gastroenterology | Admitting: Gastroenterology

## 2021-12-12 ENCOUNTER — Ambulatory Visit (HOSPITAL_BASED_OUTPATIENT_CLINIC_OR_DEPARTMENT_OTHER): Payer: Medicare Other | Admitting: Anesthesiology

## 2021-12-12 ENCOUNTER — Encounter (HOSPITAL_COMMUNITY): Payer: Self-pay | Admitting: Gastroenterology

## 2021-12-12 ENCOUNTER — Encounter (HOSPITAL_COMMUNITY)
Admission: RE | Disposition: A | Discharge status: Home / Self Care | Payer: Self-pay | Source: Home / Self Care | Attending: Gastroenterology

## 2021-12-12 ENCOUNTER — Ambulatory Visit (HOSPITAL_COMMUNITY): Payer: Medicare Other | Admitting: Anesthesiology

## 2021-12-12 ENCOUNTER — Other Ambulatory Visit: Payer: Self-pay

## 2021-12-12 DIAGNOSIS — I10 Essential (primary) hypertension: Secondary | ICD-10-CM

## 2021-12-12 DIAGNOSIS — Z538 Procedure and treatment not carried out for other reasons: Secondary | ICD-10-CM | POA: Insufficient documentation

## 2021-12-12 DIAGNOSIS — Z87891 Personal history of nicotine dependence: Secondary | ICD-10-CM | POA: Insufficient documentation

## 2021-12-12 DIAGNOSIS — I252 Old myocardial infarction: Secondary | ICD-10-CM | POA: Diagnosis not present

## 2021-12-12 DIAGNOSIS — Z7902 Long term (current) use of antithrombotics/antiplatelets: Secondary | ICD-10-CM | POA: Diagnosis not present

## 2021-12-12 DIAGNOSIS — I2510 Atherosclerotic heart disease of native coronary artery without angina pectoris: Secondary | ICD-10-CM

## 2021-12-12 DIAGNOSIS — K5010 Crohn's disease of large intestine without complications: Secondary | ICD-10-CM

## 2021-12-12 DIAGNOSIS — Z79899 Other long term (current) drug therapy: Secondary | ICD-10-CM | POA: Diagnosis not present

## 2021-12-12 DIAGNOSIS — Z955 Presence of coronary angioplasty implant and graft: Secondary | ICD-10-CM | POA: Diagnosis not present

## 2021-12-12 DIAGNOSIS — Z1211 Encounter for screening for malignant neoplasm of colon: Secondary | ICD-10-CM | POA: Diagnosis not present

## 2021-12-12 DIAGNOSIS — K5090 Crohn's disease, unspecified, without complications: Secondary | ICD-10-CM | POA: Diagnosis not present

## 2021-12-12 HISTORY — PX: FLEXIBLE SIGMOIDOSCOPY: SHX5431

## 2021-12-12 SURGERY — SIGMOIDOSCOPY, FLEXIBLE
Anesthesia: Monitor Anesthesia Care

## 2021-12-12 MED ORDER — LIDOCAINE HCL (CARDIAC) PF 100 MG/5ML IV SOSY
PREFILLED_SYRINGE | INTRAVENOUS | Status: DC | PRN
  Administered 2021-12-12: 50 mg via INTRAVENOUS

## 2021-12-12 MED ORDER — PROPOFOL 500 MG/50ML IV EMUL
INTRAVENOUS | Status: DC | PRN
  Administered 2021-12-12: 200 ug/kg/min via INTRAVENOUS

## 2021-12-12 MED ORDER — LACTATED RINGERS IV SOLN: INTRAVENOUS | Status: DC

## 2021-12-12 MED ORDER — SODIUM CHLORIDE 0.9 % IV SOLN: INTRAVENOUS | Status: DC

## 2021-12-12 MED ORDER — GLYCOPYRROLATE 0.2 MG/ML IJ SOLN
INTRAMUSCULAR | Status: DC | PRN
  Administered 2021-12-12: .1 mg via INTRAVENOUS

## 2021-12-12 MED ORDER — PHENYLEPHRINE 80 MCG/ML (10ML) SYRINGE FOR IV PUSH (FOR BLOOD PRESSURE SUPPORT)
PREFILLED_SYRINGE | INTRAVENOUS | Status: DC | PRN
  Administered 2021-12-12: 80 ug via INTRAVENOUS
  Administered 2021-12-12 (×3): 40 ug via INTRAVENOUS

## 2021-12-12 MED ORDER — PROPOFOL 10 MG/ML IV BOLUS
INTRAVENOUS | Status: DC | PRN
  Administered 2021-12-12: 30 mg via INTRAVENOUS

## 2021-12-12 SURGICAL SUPPLY — 22 items

## 2021-12-12 NOTE — Transfer of Care (Signed)
Immediate Anesthesia Transfer of Care Note  Patient: Anne [name]  Procedure(s) Performed: ABORTED COLONOSCOPY  Patient Location: PACU  Anesthesia Type:MAC  Level of Consciousness: drowsy and patient cooperative  Airway & Oxygen Therapy: Patient Spontanous Breathing  Post-op Assessment: Report given to RN and Post -op Vital signs reviewed and stable  Post vital signs: Reviewed and stable  Last Vitals:  Vitals Value Taken Time  BP 118/94 12/12/21 1107  Temp    Pulse 55 12/12/21 1108  Resp 23 12/12/21 1108  SpO2 98 % 12/12/21 1108  Vitals shown include unvalidated device data.  Last Pain:  Vitals:   12/12/21 1011  TempSrc: Temporal  PainSc: 0-No pain         Complications: No notable events documented.

## 2021-12-12 NOTE — H&P (Signed)
Anne [name] HPI: The patient is here for her q2 year colonoscopy for colonic Crohn's disease.  Past Medical History:  Diagnosis Date   Coronary artery disease    Crohn's colitis ([place])    Hypokalemia 08/04/2019   STEMI (ST elevation myocardial infarction) ([place])    10/27/17 PCI/DES x1 to the dRCA, with residual thrombus in the PLA, normal EF    Past Surgical History:  Procedure Laterality Date   BIOPSY  09/22/2019   Procedure: BIOPSY;  Surgeon: Carol Ada, MD;  [facility];  Service: Endoscopy;;   COLONOSCOPY WITH PROPOFOL N/A 09/22/2019   Procedure: COLONOSCOPY WITH PROPOFOL;  Surgeon: Carol Ada, MD;  [facility];  Service: Endoscopy;  Laterality: N/A;   CORONARY/GRAFT ACUTE MI REVASCULARIZATION N/A 10/26/2017   Procedure: Coronary/Graft Acute MI Revascularization;  Surgeon: Troy Sine, MD;  Location: [place] [facility];  Service: Cardiovascular;  Laterality: N/A;   LEFT HEART CATH AND CORONARY ANGIOGRAPHY N/A 10/26/2017   Procedure: LEFT HEART CATH AND CORONARY ANGIOGRAPHY;  Surgeon: Troy Sine, MD;  Location: [place] [facility];  Service: Cardiovascular;  Laterality: N/A;   LEFT HEART CATH AND CORONARY ANGIOGRAPHY Left 05/05/2018   Procedure: Left heart cath and coronary angiography;  Surgeon: Dionisio David, MD;  Location: [place] [facility];  Service: Cardiovascular;  Laterality: Left;   MUSCLE BIOPSY Left 08/10/2019   Procedure: THIGH MUSCLE BIOPSY;  Surgeon: Coralie Keens, MD;  Location: [place];  Service: General;  Laterality: Left;   SMALL INTESTINE SURGERY      Family History  Problem Relation Age of Onset   Crohn's disease Neg Hx     Social History:  reports that she has quit smoking. Her smoking use included cigarettes. She has a 11.25 pack-year smoking history. She has never used smokeless tobacco. She reports that she does not drink alcohol and does not use drugs.  Allergies: No Known Allergies  Medications:  Scheduled: Continuous:  sodium chloride      No results found for this or any previous visit (from the past 24 hour(s)).   No results found.  ROS:  As stated above in the HPI otherwise negative.  Last menstrual period 02/18/2001.    PE: Gen: NAD, Alert and Oriented HEENT:  Waterproof/AT, EOMI Neck: Supple, no LAD Lungs: CTA Bilaterally CV: RRR without M/G/R ABD: Soft, NTND, +BS Ext: No C/C/E  Assessment/Plan: 1) Crohn's disease - colonoscopy.  Anne [name] D 12/12/2021, 9:50 AM

## 2021-12-12 NOTE — Anesthesia Postprocedure Evaluation (Signed)
Anesthesia Post Note  Patient: Anne [name]  Procedure(s) Performed: ABORTED COLONOSCOPY     Patient location during evaluation: Endoscopy Anesthesia Type: MAC Level of consciousness: oriented, awake and alert and awake Pain management: pain level controlled Vital Signs Assessment: post-procedure vital signs reviewed and stable Respiratory status: spontaneous breathing, nonlabored ventilation, respiratory function stable and patient connected to nasal cannula oxygen Cardiovascular status: blood pressure returned to baseline and stable Postop Assessment: no headache, no backache and no apparent nausea or vomiting Anesthetic complications: no   No notable events documented.  Last Vitals:  Vitals:   12/12/21 1120 12/12/21 1130  BP: 102/70 104/81  Pulse: 65 61  Resp: 13 10  Temp:    SpO2: 98% 97%    Last Pain:  Vitals:   12/12/21 1130  TempSrc:   PainSc: 0-No pain                 [name]

## 2021-12-12 NOTE — Op Note (Signed)
[facility] [address] Patient Name: Anne [name] Procedure Date: 12/12/2021 MRN: [id number] Attending MD: Carol Ada , MD Date of Birth: 1956/04/09 CSN: [id number] Age: 66 Admit Type: Outpatient Procedure:                Flexible Sigmoidoscopy Indications:              High risk colon cancer surveillance: Crohn's                            colitis of 8 (or more) years duration with                            one-third (or more) of the colon involved Providers:                Carol Ada, MD, Dulcy Fanny, Cletis Athens,                            Technician Referring MD:              Medicines:                Propofol per Anesthesia Complications:            No immediate complications. Estimated Blood Loss:     Estimated blood loss: none. Procedure:                Pre-Anesthesia Assessment:                           - Prior to the procedure, a History and Physical                            was performed, and patient medications and                            allergies were reviewed. The patient's tolerance of                            previous anesthesia was also reviewed. The risks                            and benefits of the procedure and the sedation                            options and risks were discussed with the patient.                            All questions were answered, and informed consent                            was obtained. Prior Anticoagulants: The patient has                            taken no previous anticoagulant or antiplatelet  agents. ASA Grade Assessment: III - A patient with                            severe systemic disease. After reviewing the risks                            and benefits, the patient was deemed in                            satisfactory condition to undergo the procedure.                           - Sedation was administered by an anesthesia                            professional. Deep  sedation was attained.                           After obtaining informed consent, the scope was                            passed under direct vision. The CF-[id number] ([id number])                            Olympus colonoscope was introduced through the anus                            and advanced to the the sigmoid colon. After                            obtaining informed consent, the scope was passed                            under direct vision.The quality of the bowel                            preparation was poor. Scope In: 10:55:56 AM Scope Out: 10:58:54 AM Total Procedure Duration: 0 hours 2 minutes 57 seconds  Findings:      The sigmoid colon appeared normal. The procedure was converted an FFS.       Advancement of the colonoscope only revealed more thick liquid stool and       visualization became more difficult. Impression:               - Preparation of the colon was poor.                           - The sigmoid colon is normal.                           - No specimens collected. Moderate Sedation:      Not Applicable - Patient had care per Anesthesia. Recommendation:           - Patient has a contact number available for  emergencies. The signs and symptoms of potential                            delayed complications were discussed with the                            patient. Return to normal activities tomorrow.                            Written discharge instructions were provided to the                            patient.                           - Resume regular diet.                           - Reschedule and reprep for the colonoscopy. Procedure Code(s):        --- Professional ---                           [phone], Sigmoidoscopy, flexible; diagnostic,                            including collection of specimen(s) by brushing or                            washing, when performed (separate procedure) Diagnosis Code(s):        --- Professional  ---                           K50.10, Crohn's disease of large intestine without                            complications CPT copyright 2019 American Medical Association. All rights reserved. The codes documented in this report are preliminary and upon coder review may  be revised to meet current compliance requirements. Carol Ada, MD Carol Ada, MD 12/12/2021 11:05:15 AM This report has been signed electronically. Number of Addenda: 0

## 2021-12-12 NOTE — Anesthesia Preprocedure Evaluation (Addendum)
Anesthesia Evaluation  Patient identified by MRN, date of birth, ID band Patient awake    Reviewed: Allergy & Precautions, NPO status , Patient's Chart, lab work & pertinent test results  Airway Mallampati: II  TM Distance: >3 FB Neck ROM: Full    Dental  (+) Dental Advisory Given, Edentulous Upper   Pulmonary Patient abstained from smoking., former smoker,    Pulmonary exam normal breath sounds clear to auscultation       Cardiovascular hypertension, Pt. on medications + CAD, + Past MI and + Cardiac Stents (RCA)  Normal cardiovascular exam Rhythm:Regular Rate:Normal     Neuro/Psych negative neurological ROS     GI/Hepatic Neg liver ROS, Crohn's colitis    Endo/Other  negative endocrine ROS  Renal/GU negative Renal ROS     Musculoskeletal negative musculoskeletal ROS (+)   Abdominal   Peds  Hematology  (+) Blood dyscrasia ([place]), ,   Anesthesia Other Findings Day of surgery medications reviewed with the patient.  Reproductive/Obstetrics                            Anesthesia Physical Anesthesia Plan  ASA: 3  Anesthesia Plan: MAC   Post-op Pain Management: Minimal or no pain anticipated   Induction: Intravenous  PONV Risk Score and Plan: 2 and TIVA and Treatment may vary due to age or medical condition  Airway Management Planned: Nasal Cannula and Natural Airway  Additional Equipment:   Intra-op Plan:   Post-operative Plan:   Informed Consent: I have reviewed the patients History and Physical, chart, labs and discussed the procedure including the risks, benefits and alternatives for the proposed anesthesia with the patient or authorized representative who has indicated his/her understanding and acceptance.     Dental advisory given  Plan Discussed with: CRNA and Anesthesiologist  Anesthesia Plan Comments:         Anesthesia Quick Evaluation

## 2021-12-14 ENCOUNTER — Encounter (HOSPITAL_COMMUNITY): Payer: Self-pay | Admitting: Gastroenterology

## 2021-12-24 ENCOUNTER — Other Ambulatory Visit (HOSPITAL_COMMUNITY): Payer: Self-pay | Admitting: *Deleted

## 2021-12-24 NOTE — Progress Notes (Signed)
Surgical Instructions    Your procedure is scheduled on Friday September 22nd .  Report to [facility] Main Entrance "A" at 5:30 A.M., then check in with the Admitting office.  Call this number if you have problems the morning of surgery:  [phone]   If you have any questions prior to your surgery date call [phone]: Open Monday-Friday 8am-4pm    Remember:  Do not eat or drink after midnight the night before your surgery     Take these medicines the morning of surgery with A SIP OF WATER: gabapentin (NEURONTIN) 400 MG capsule rosuvastatin (CRESTOR) 40 MG tablet  IF NEEDED  acetaminophen-codeine (TYLENOL #4) 300-60 MG tablet midodrine (PROAMATINE) 5 MG tablet nitroGLYCERIN (NITROSTAT) 0.4 MG SL tablet   Follow your surgeon's instructions on when to stop Brilinta.  If no instructions were given by your surgeon then you will need to call the office to get those instructions.     As of today, STOP taking any Aspirin (unless otherwise instructed by your surgeon) Aleve, Naproxen, Ibuprofen, Motrin, Advil, Goody's, BC's, all herbal medications, fish oil, and all vitamins.           Do not wear jewelry or makeup. Do not wear lotions, powders, perfumes or deodorant. Do not shave 48 hours prior to surgery.   Do not bring valuables to the hospital. Do not wear nail polish, gel polish, artificial nails, or any other type of covering on natural nails (fingers and toes) If you have artificial nails or gel coating that need to be removed by a nail salon, please have this removed prior to surgery. Artificial nails or gel coating may interfere with anesthesia's ability to adequately monitor your vital signs.  [facility] is not responsible for any belongings or valuables.    Do NOT Smoke (Tobacco/Vaping)  24 hours prior to your procedure  If you use a CPAP at night, you may bring your mask for your overnight stay.   Contacts, glasses, hearing aids, dentures or partials may not be  worn into surgery, please bring cases for these belongings   For patients admitted to the hospital, discharge time will be determined by your treatment team.   Patients discharged the day of surgery will not be allowed to drive home, and someone needs to stay with them for 24 hours.   SURGICAL WAITING ROOM VISITATION Patients having surgery or a procedure may have no more than 2 support people in the waiting area - these visitors may rotate.   Children under the age of 35 must have an adult with them who is not the patient. If the patient needs to stay at the hospital during part of their recovery, the visitor guidelines for inpatient rooms apply. Pre-op nurse will coordinate an appropriate time for 1 support person to accompany patient in pre-op.  This support person may not rotate.   Please refer to the [facility] website for the visitor guidelines for Inpatients (after your surgery is over and you are in a regular room).    Special instructions:    Oral Hygiene is also important to reduce your risk of infection.  Remember - BRUSH YOUR TEETH THE MORNING OF SURGERY WITH YOUR REGULAR TOOTHPASTE   [place]- Preparing For Surgery  Before surgery, you can play an important role. Because skin is not sterile, your skin needs to be as free of germs as possible. You can reduce the number of germs on your skin by washing with CHG (chlorahexidine gluconate) Soap before  surgery.  CHG is an antiseptic cleaner which kills germs and bonds with the skin to continue killing germs even after washing.     Please do not use if you have an allergy to CHG or antibacterial soaps. If your skin becomes reddened/irritated stop using the CHG.  Do not shave (including legs and underarms) for at least 48 hours prior to first CHG shower. It is OK to shave your face.  Please follow these instructions carefully.     Shower the NIGHT BEFORE SURGERY and the MORNING OF SURGERY with CHG Soap.   If you chose to  wash your hair, wash your hair first as usual with your normal shampoo. After you shampoo, rinse your hair and body thoroughly to remove the shampoo.  Then [employer] and genitals (private parts) with your normal soap and rinse thoroughly to remove soap.  After that Use CHG Soap as you would any other liquid soap. You can apply CHG directly to the skin and wash gently with a scrungie or a clean washcloth.   Apply the CHG Soap to your body ONLY FROM THE NECK DOWN.  Do not use on open wounds or open sores. Avoid contact with your eyes, ears, mouth and genitals (private parts). Wash Face and genitals (private parts)  with your normal soap.   Wash thoroughly, paying special attention to the area where your surgery will be performed.  Thoroughly rinse your body with warm water from the neck down.  DO NOT shower/wash with your normal soap after using and rinsing off the CHG Soap.  Pat yourself dry with a CLEAN TOWEL.  Wear CLEAN PAJAMAS to bed the night before surgery  Place CLEAN SHEETS on your bed the night before your surgery  DO NOT SLEEP WITH PETS.   Day of Surgery:  Take a shower with CHG soap. Wear Clean/Comfortable clothing the morning of surgery Do not apply any deodorants/lotions.   Remember to brush your teeth WITH YOUR REGULAR TOOTHPASTE.    If you received a COVID test during your pre-op visit, it is requested that you wear a mask when out in public, stay away from anyone that may not be feeling well, and notify your surgeon if you develop symptoms. If you have been in contact with anyone that has tested positive in the last 10 days, please notify your surgeon.    Please read over the following fact sheets that you were given.

## 2021-12-25 ENCOUNTER — Other Ambulatory Visit (HOSPITAL_COMMUNITY): Payer: BLUE CROSS/BLUE SHIELD

## 2021-12-26 ENCOUNTER — Encounter (HOSPITAL_COMMUNITY): Payer: Self-pay

## 2021-12-26 ENCOUNTER — Other Ambulatory Visit: Payer: Self-pay

## 2021-12-26 ENCOUNTER — Encounter (HOSPITAL_COMMUNITY)
Admission: RE | Admit: 2021-12-26 | Discharge: 2021-12-26 | Disposition: A | Discharge status: Home / Self Care | Payer: Medicare Other | Source: Ambulatory Visit | Attending: Neurological Surgery | Admitting: Neurological Surgery

## 2021-12-26 VITALS — BP 110/73 | HR 79 | Temp 98.2°F | Resp 16 | Ht 69.0 in | Wt 151.9 lb

## 2021-12-26 DIAGNOSIS — Z01812 Encounter for preprocedural laboratory examination: Secondary | ICD-10-CM | POA: Insufficient documentation

## 2021-12-26 DIAGNOSIS — I10 Essential (primary) hypertension: Secondary | ICD-10-CM | POA: Diagnosis not present

## 2021-12-26 DIAGNOSIS — Z01818 Encounter for other preprocedural examination: Secondary | ICD-10-CM

## 2021-12-26 HISTORY — DX: Gastro-esophageal reflux disease without esophagitis: K21.9

## 2021-12-26 LAB — CBC
HCT: 43.4 % (ref 36.0–46.0)
Hemoglobin: 14 g/dL (ref 12.0–15.0)
MCH: 31.4 pg (ref 26.0–34.0)
MCHC: 32.3 g/dL (ref 30.0–36.0)
MCV: 97.3 fL (ref 80.0–100.0)
Platelets: 285 10*3/uL (ref 150–400)
RBC: 4.46 MIL/uL (ref 3.87–5.11)
RDW: 16.6 % — ABNORMAL HIGH (ref 11.5–15.5)
WBC: 6.7 10*3/uL (ref 4.0–10.5)
nRBC: 0 % (ref 0.0–0.2)

## 2021-12-26 LAB — BASIC METABOLIC PANEL
Anion gap: 6 (ref 5–15)
BUN: 18 mg/dL (ref 8–23)
CO2: 20 mmol/L — ABNORMAL LOW (ref 22–32)
Calcium: 8.9 mg/dL (ref 8.9–10.3)
Chloride: 114 mmol/L — ABNORMAL HIGH (ref 98–111)
Creatinine, Ser: 1.36 mg/dL — ABNORMAL HIGH (ref 0.44–1.00)
GFR, Estimated: 42 mL/min — ABNORMAL LOW (ref >60)
Glucose, Bld: 98 mg/dL (ref 70–99)
Potassium: 3.9 mmol/L (ref 3.5–5.1)
Sodium: 140 mmol/L (ref 135–145)

## 2021-12-26 LAB — TYPE AND SCREEN
ABO/RH(D): O POS
Antibody Screen: NEGATIVE

## 2021-12-26 LAB — PROTIME-INR
INR: 1.1 (ref 0.8–1.2)
Prothrombin Time: 14.4 seconds (ref 11.4–15.2)

## 2021-12-26 LAB — SURGICAL PCR SCREEN
MRSA, PCR: POSITIVE — AB
Staphylococcus aureus: POSITIVE — AB

## 2021-12-26 NOTE — Progress Notes (Signed)
Positive PCR results called in to [facility] at Dr. Ronnald Ramp' office

## 2021-12-26 NOTE — Progress Notes (Signed)
PCP - Dr. Dorisann Frames [facility]  PPM/ICD - denies   Chest x-ray - 02/01/20 EKG - 01/29/21 Stress Test - Pt says she had one either 03/2021 or 04/2021. Records requested from Dr. Humphrey Rolls ECHO - 10/27/17 Cardiac Cath - 05/05/18  Sleep Study - denies   DM- denies  [facility] Thinner Instructions: Stop Brilinta 5-6 days. Last dose 12/26/21. Aspirin Instructions: n/a  ERAS Protcol - NPO   COVID TEST- n/a   Anesthesia review: yes, cardiac hx  Patient denies shortness of breath, fever, cough and chest pain at PAT appointment   All instructions explained to the patient, with a verbal understanding of the material. Patient agrees to go over the instructions while at home for a better understanding. The opportunity to ask questions was provided.

## 2021-12-29 NOTE — Progress Notes (Signed)
Anesthesia Chart Review:  Patient follows with cardiologist Dr. Humphrey Rolls at [facility] for history of CAD s/p STEMI 2019 treated with DES to RCA.  Cath 04/2018 showed distal RCA lesion 30% stenosed, otherwise essentially normal coronaries.  Echo 04/2021 showed EF 65%, normal valves.  Stress test 03/2021 done for evaluation of DOE showed, "large severe reversible basal anteroseptal, mid anteroseptal and inferoseptal wall defects, normal wall motion.  Impression: Ischemia in the LAD territory with normal LVEF, consider further work-up if having chest pain."  Per note, CCTA was discussed, however, not pursued due to patient having renal insufficiency.  She was last seen by Dr. Humphrey Rolls 10/31/2021 for preop evaluation.  Per note, "patient doing well cardiac wise.  Patient optimized and cleared for neurosurgery from cardiac standpoint.  Stop Brilinta 5 to 6 days prior."  I did speak with Dr. Humphrey Rolls to clarify stress test findings.  He stated that he felt the findings of the test may have been artifact/breast attenuation given the patient's lack of chest pain.  He reported that her DOE had improved and he did not feel that it was an anginal equivalent.  He elected to treat her medically and felt that she did not require any additional testing prior to undergoing surgery.  Preop labs reviewed, creatinine mildly elevated 1.36 consistent with history of CKD, labs otherwise unremarkable.  Due to findings of ischemia in LAD territory on stress test 03/2021, case was reviewed with anesthesiologist Dr. Tobias Alexander and Dr. Deatra Canter.  They both recommended the patient would benefit from additional cardiology evaluation prior to undergoing elective surgery.  Reached out to [facility] cardiology for expedited scheduling.  Patient scheduled to see Dr. Stanford Breed on 12/31/2021 to establish care and for evaluation of CAD.  Dr. Stanford Breed recommended preop nuclear stress test for risk stratification.  He also discontinued the patient's enalapril  and metoprolol as she has had issues with hypotension which was being treated with midodrine.  Her Brilinta was also discontinued and she was advised to begin aspirin 81 mg daily following surgery.  Nuclear stress 01/01/2022 was low risk, nonischemic.  EKG 12/31/2021 (per Dr. Jacalyn Lefevre note): NSR with nonspecific ST changes.  Nuclear stress 01/01/2022:   The study is low risk.   No ST deviation was noted.   LV perfusion is normal. There is no evidence of ischemia. There is no evidence of infarction. There is a mild inferior wall perfusion defect on rest and stress with an adjacent loop of bowel with significant extracardiac tracer uptake, likely contributing to attenuation artifact.   Left ventricular function is calculated as abnormal. There were no regional wall motion abnormalities. Nuclear stress EF: 46 %, however visually appears closer to 50-55%. The left ventricular ejection fraction is mildly decreased (45-54%). End diastolic cavity size is normal. End systolic cavity size is normal.   Prior study available for comparison from 03/13/2021. Prior study unavailable for side by side comparison.  Echo 04/2021 ([facility]): EF 65%. Normal chamber sizes. Normal left ventricular systolic function. Normal left ventricular diastolic dysfunction. Normal right ventricular systolic function. Normal left ventricular wall motion. Normal right ventricular wall motion. Trace tricuspid regurgitation. Normal pulmonary artery pressure. Trace mitral vegetation. No pericardial effusion.  Stress test 03/2021 ([facility]): EKG results: Sinus tachycardia.  122/min.  No significant ST changes at peak exercise. Image quality: Good Perfusion/wall motion findings: EF equals 63%.  Large severe reversible basal anteroseptal, mid anteroseptal and inferoseptal wall defects, normal wall motion. Impression: Ischemia in the LAD territory with normal LVEF,  consider further work-up if having chest  pain.     Wynonia Musty [facility] Short Stay Center/Anesthesiology Phone [phone] 01/01/2022 3:00 PM

## 2021-12-31 ENCOUNTER — Ambulatory Visit (INDEPENDENT_AMBULATORY_CARE_PROVIDER_SITE_OTHER): Payer: Medicare Other | Admitting: Cardiology

## 2021-12-31 ENCOUNTER — Encounter: Payer: Self-pay | Admitting: *Deleted

## 2021-12-31 ENCOUNTER — Encounter: Payer: Self-pay | Admitting: Cardiology

## 2021-12-31 ENCOUNTER — Other Ambulatory Visit: Payer: Self-pay | Admitting: *Deleted

## 2021-12-31 VITALS — BP 88/48 | HR 93 | Ht 69.0 in | Wt 155.1 lb

## 2021-12-31 DIAGNOSIS — Z01810 Encounter for preprocedural cardiovascular examination: Secondary | ICD-10-CM

## 2021-12-31 DIAGNOSIS — I2510 Atherosclerotic heart disease of native coronary artery without angina pectoris: Secondary | ICD-10-CM | POA: Diagnosis not present

## 2021-12-31 DIAGNOSIS — I10 Essential (primary) hypertension: Secondary | ICD-10-CM | POA: Diagnosis not present

## 2021-12-31 DIAGNOSIS — E782 Mixed hyperlipidemia: Secondary | ICD-10-CM | POA: Diagnosis not present

## 2021-12-31 MED ORDER — ASPIRIN 81 MG PO TBEC: 81.0000 mg | DELAYED_RELEASE_TABLET | Freq: Every day | ORAL | 3 refills | Status: AC

## 2021-12-31 NOTE — Patient Instructions (Signed)
Medication Instructions:   STOP BRILINTA  STOP ENALAPRIL   AFTER SURGERY START ASPIRIN 81 MG ONCE DAILY  *If you need a refill on your cardiac medications before your next appointment, please call your pharmacy*   Lab Work:  Your physician recommends that you return for lab work TOMORROW -[place]  If you have labs (blood work) drawn today and your tests are completely normal, you will receive your results only by: [place] (if you have MyChart) OR A paper copy in the mail If you have any lab test that is abnormal or we need to change your treatment, we will call you to review the results.   Testing/Procedures:  Your physician has requested that you have a lexiscan myoview. For further information please visit [email or web address]. Please follow instruction sheet, as given. [place], you and your health needs are our priority.  As part of our continuing mission to provide you with exceptional heart care, we have created designated Provider Care Teams.  These Care Teams include your primary Cardiologist (physician) and Advanced Practice Providers (APPs -  Physician Assistants and Nurse Practitioners) who all work together to provide you with the care you need, when you need it.  We recommend signing up for the patient portal called "MyChart".  Sign up information is provided on this After Visit Summary.  MyChart is used to connect with patients for Virtual Visits (Telemedicine).  Patients are able to view lab/test results, encounter notes, upcoming appointments, etc.  Non-urgent messages can be sent to your provider as well.   To learn more about what you can do with MyChart, go to [email or web address].    Your next appointment:   6 month(s)  The format for your next appointment:   In Person  Provider:   Kirk Ruths, MD

## 2021-12-31 NOTE — Progress Notes (Signed)
Referring-S Ahmed Tejan-Sie Reason for referral-preoperative evaluation and CAD  HPI: 66 year old female for preoperative evaluation and CAD at the request of S Ahmed Tejan-Sie.  Patient does have a prior cardiac history.  In July 2019 she had an acute inferior myocardial infarction.  Cardiac catheterization revealed 95% proximal RCA with extensive thrombus formation.  Patient had PCI of the right coronary artery at that time.  Echocardiogram July 2019 showed normal LV function and mild mitral regurgitation.  Follow-up catheterization January 2020 by Dr Humphrey Rolls showed a distal 30% stenosis but essentially normal coronary arteries by report.  Patient has not been seen in our office since 2019.  Nuclear study December 2022 showed ejection fraction 63% and a large severe reversible anterior defect felt to be ischemia in the LAD distribution.  Patient had an echocardiogram January 2023 in [place].  LV function was normal.  Patient is scheduled to undergo back surgery for radiculopathy and cardiology asked to evaluate preoperatively.  Patient has dyspnea with more vigorous activities but not routine activities.  No orthopnea, PND, pedal edema, chest pain or syncope.  Current Outpatient Medications  Medication Sig Dispense Refill   acetaminophen-codeine (TYLENOL #4) 300-60 MG tablet Take 2 tablets by mouth 2 (two) times daily as needed.     cholestyramine (QUESTRAN) 4 g packet Take 4 g by mouth daily.     cyclobenzaprine (FLEXERIL) 10 MG tablet Take 10 mg by mouth at bedtime.     enalapril (VASOTEC) 2.5 MG tablet Take 2.5 mg by mouth daily.     ezetimibe (ZETIA) 10 MG tablet Take 10 mg by mouth at bedtime.     gabapentin (NEURONTIN) 400 MG capsule Take 400 mg by mouth daily.     INFLIXIMAB IV Inject 1 Dose into the vein every 8 (eight) weeks.     midodrine (PROAMATINE) 5 MG tablet Take 1 tablet (5 mg total) by mouth 3 (three) times daily with meals. (Patient taking differently: Take 5 mg by mouth  daily as needed (Take if blood pressure is low).) 30 tablet 0   nitroGLYCERIN (NITROSTAT) 0.4 MG SL tablet Place 1 tablet (0.4 mg total) under the tongue every 5 (five) minutes x 3 doses as needed for chest pain. 25 tablet 2   rosuvastatin (CRESTOR) 40 MG tablet Take 1 tablet (40 mg total) by mouth daily.     sodium bicarbonate 650 MG tablet Take 3 tablets (1,950 mg total) by mouth 2 (two) times daily. 180 tablet 0   BRILINTA 60 MG TABS tablet Take 60 mg by mouth 2 (two) times daily. (Patient not taking: Reported on 12/31/2021)     No current facility-administered medications for this visit.    No Known Allergies   Past Medical History:  Diagnosis Date   Coronary artery disease    Crohn's colitis ([place])    GERD (gastroesophageal reflux disease)    Hyperlipidemia    Hypokalemia 08/04/2019   STEMI (ST elevation myocardial infarction) ([place])    10/27/17 PCI/DES x1 to the dRCA, with residual thrombus in the PLA, normal EF    Past Surgical History:  Procedure Laterality Date   BIOPSY  09/22/2019   Procedure: BIOPSY;  Surgeon: Carol Ada, MD;  Location: [facility];  Service: Endoscopy;;   CHOLECYSTECTOMY  2005   COLONOSCOPY WITH PROPOFOL N/A 09/22/2019   Procedure: COLONOSCOPY WITH PROPOFOL;  Surgeon: Carol Ada, MD;  Location: [facility];  Service: Endoscopy;  Laterality: N/A;   CORONARY/GRAFT ACUTE MI REVASCULARIZATION N/A 10/26/2017   Procedure:  Coronary/Graft Acute MI Revascularization;  Surgeon: Troy Sine, MD;  Location: [place];  Service: Cardiovascular;  Laterality: N/A;   FLEXIBLE SIGMOIDOSCOPY N/A 12/12/2021   Procedure: FLEXIBLE SIGMOIDOSCOPY;  Surgeon: Carol Ada, MD;  Location: [facility];  Service: Gastroenterology;  Laterality: N/A;   LEFT HEART CATH AND CORONARY ANGIOGRAPHY N/A 10/26/2017   Procedure: LEFT HEART CATH AND CORONARY ANGIOGRAPHY;  Surgeon: Troy Sine, MD;  Location: [facility];  Service: Cardiovascular;  Laterality:  N/A;   LEFT HEART CATH AND CORONARY ANGIOGRAPHY Left 05/05/2018   Procedure: Left heart cath and coronary angiography;  Surgeon: Dionisio David, MD;  Location: [facility];  Service: Cardiovascular;  Laterality: Left;   MUSCLE BIOPSY Left 08/10/2019   Procedure: THIGH MUSCLE BIOPSY;  Surgeon: Coralie Keens, MD;  Location: [place] [place];  Service: General;  Laterality: Left;   SMALL INTESTINE SURGERY     TONSILLECTOMY     removed as a child    Social History   Socioeconomic History   Marital status: Single    Spouse name: Not on file   Number of children: 1   Years of education: Not on file   Highest education level: Not on file  Occupational History   Not on file  Tobacco Use   Smoking status: Former    Packs/day: 0.25    Years: 45.00    Total pack years: 11.25    Types: Cigarettes    Quit date: 11/18/2021    Years since quitting: 0.1   Smokeless tobacco: Never  Vaping Use   Vaping Use: Never used  Substance and Sexual Activity   Alcohol use: No   Drug use: No   Sexual activity: Not on file  Other Topics Concern   Not on file  Social History Narrative   Not on file   Social Determinants of Health   Financial Resource Strain: Not on file  Food Insecurity: Not on file  Transportation Needs: Not on file  Physical Activity: Not on file  Stress: Not on file  Social Connections: Not on file  Intimate Partner Violence: Not on file    Family History  Problem Relation Age of Onset   Stroke Mother    Crohn's disease Neg Hx     ROS: Some tingling and numbness in left lower extremity but no fevers or chills, productive cough, hemoptysis, dysphasia, odynophagia, melena, hematochezia, dysuria, hematuria, rash, seizure activity, orthopnea, PND, pedal edema, claudication. Remaining systems are negative.  Physical Exam:   Blood pressure (!) 88/48, pulse 93, height 5' 9"  (1.753 m), weight 155 lb 1.9 oz (70.4 kg), last menstrual period 02/18/2001, SpO2 97 %.  General:   Well developed/well nourished in NAD Skin warm/dry Patient not depressed No peripheral clubbing Back-normal HEENT-normal/normal eyelids Neck supple/normal carotid upstroke bilaterally; no bruits; no JVD; no thyromegaly chest - CTA/ normal expansion CV - RRR/normal S1 and S2; no murmurs, rubs or gallops;  PMI nondisplaced Abdomen -NT/ND, no HSM, no mass, + bowel sounds, no bruit 2+ femoral pulses, no bruits Ext-no edema, chords, 2+ DP Neuro-grossly nonfocal  Electrocardiogram shows normal sinus rhythm with nonspecific ST changes.   A/P  1 preoperative evaluation prior to back surgery-patient has reasonable functional capacity without chest pain.  However there is a nuclear study from December 2022 that showed severe ischemia in the anterior wall.  I am concerned about those results despite no symptoms.  We will plan to repeat stress nuclear study.  If normal or  low risk she may proceed without further evaluation.  We will arrange this for tomorrow as her surgery is in 2 days.  2 coronary artery disease-patient has a history of myocardial infarction in 2019 with PCI of the right coronary artery.  Follow-up catheterization in 2020 showed essentially normal coronary arteries.  She has discontinued her Brilinta in anticipation of surgery.  She will remain off of this medication.  After her surgery she will begin aspirin 81 mg daily.  She will continue statin.  3 history of hypotension-patient is on midodrine.  She is also on enalapril for unclear reasons as her LV function is normal.  I will discontinue enalapril (as well as metoprolol that she takes as needed).  If her blood pressure increases we may be able to discontinue midodrine in the future.  4 hyperlipidemia-continue Crestor and Zetia.  5 tobacco abuse-patient counseled on discontinuing.  She did state that she has discontinued for 2 weeks.  Kirk Ruths, MD

## 2022-01-01 ENCOUNTER — Other Ambulatory Visit: Payer: BLUE CROSS/BLUE SHIELD

## 2022-01-01 ENCOUNTER — Ambulatory Visit (HOSPITAL_BASED_OUTPATIENT_CLINIC_OR_DEPARTMENT_OTHER): Payer: Medicare Other

## 2022-01-01 ENCOUNTER — Ambulatory Visit: Payer: BLUE CROSS/BLUE SHIELD | Admitting: Cardiology

## 2022-01-01 ENCOUNTER — Encounter: Payer: Self-pay | Admitting: *Deleted

## 2022-01-01 DIAGNOSIS — Z7982 Long term (current) use of aspirin: Secondary | ICD-10-CM | POA: Diagnosis not present

## 2022-01-01 DIAGNOSIS — M50021 Cervical disc disorder at C4-C5 level with myelopathy: Secondary | ICD-10-CM | POA: Diagnosis present

## 2022-01-01 DIAGNOSIS — N289 Disorder of kidney and ureter, unspecified: Secondary | ICD-10-CM | POA: Diagnosis not present

## 2022-01-01 DIAGNOSIS — Z87891 Personal history of nicotine dependence: Secondary | ICD-10-CM | POA: Diagnosis not present

## 2022-01-01 DIAGNOSIS — Z981 Arthrodesis status: Secondary | ICD-10-CM | POA: Diagnosis not present

## 2022-01-01 DIAGNOSIS — M4712 Other spondylosis with myelopathy, cervical region: Secondary | ICD-10-CM | POA: Diagnosis not present

## 2022-01-01 DIAGNOSIS — Z01810 Encounter for preprocedural cardiovascular examination: Secondary | ICD-10-CM

## 2022-01-01 DIAGNOSIS — I2510 Atherosclerotic heart disease of native coronary artery without angina pectoris: Secondary | ICD-10-CM

## 2022-01-01 DIAGNOSIS — I252 Old myocardial infarction: Secondary | ICD-10-CM | POA: Diagnosis not present

## 2022-01-01 DIAGNOSIS — Z79899 Other long term (current) drug therapy: Secondary | ICD-10-CM | POA: Diagnosis not present

## 2022-01-01 DIAGNOSIS — M4322 Fusion of spine, cervical region: Secondary | ICD-10-CM | POA: Diagnosis not present

## 2022-01-01 DIAGNOSIS — J3489 Other specified disorders of nose and nasal sinuses: Secondary | ICD-10-CM | POA: Diagnosis not present

## 2022-01-01 DIAGNOSIS — I10 Essential (primary) hypertension: Secondary | ICD-10-CM | POA: Diagnosis not present

## 2022-01-01 DIAGNOSIS — K219 Gastro-esophageal reflux disease without esophagitis: Secondary | ICD-10-CM | POA: Diagnosis present

## 2022-01-01 DIAGNOSIS — E785 Hyperlipidemia, unspecified: Secondary | ICD-10-CM | POA: Diagnosis present

## 2022-01-01 DIAGNOSIS — M4802 Spinal stenosis, cervical region: Secondary | ICD-10-CM | POA: Diagnosis not present

## 2022-01-01 LAB — MYOCARDIAL PERFUSION IMAGING
LV dias vol: 75 mL (ref 46–106)
LV sys vol: 41 mL
Nuc Stress EF: 46 %
Peak HR: 88 {beats}/min
Rest HR: 62 {beats}/min
Rest Nuclear Isotope Dose: 10.1 mCi
SDS: 1
SRS: 0
SSS: 1
ST Depression (mm): 0 mm
Stress Nuclear Isotope Dose: 32.4 mCi
TID: 1.09

## 2022-01-01 MED ORDER — TECHNETIUM TC 99M TETROFOSMIN IV KIT
32.4000 | PACK | Freq: Once | INTRAVENOUS | Status: AC | PRN
  Administered 2022-01-01: 32.4 via INTRAVENOUS

## 2022-01-01 MED ORDER — TECHNETIUM TC 99M TETROFOSMIN IV KIT
10.1000 | PACK | Freq: Once | INTRAVENOUS | Status: AC | PRN
  Administered 2022-01-01: 10.1 via INTRAVENOUS

## 2022-01-01 MED ORDER — REGADENOSON 0.4 MG/5ML IV SOLN
0.4000 mg | Freq: Once | INTRAVENOUS | Status: AC
  Administered 2022-01-01: 0.4 mg via INTRAVENOUS

## 2022-01-01 NOTE — Anesthesia Preprocedure Evaluation (Addendum)
Anesthesia Evaluation  Patient identified by MRN, date of birth, ID band Patient awake    Reviewed: Allergy & Precautions, NPO status , Patient's Chart, lab work & pertinent test results  Airway Mallampati: II  TM Distance: >3 FB Neck ROM: Full    Dental  (+) Dental Advisory Given, Partial Upper   Pulmonary former smoker,    Pulmonary exam normal breath sounds clear to auscultation       Cardiovascular hypertension, Pt. on medications + CAD, + Past MI and + Cardiac Stents  Normal cardiovascular exam Rhythm:Regular Rate:Normal  Lexiscan 01/01/22: The study is low risk. .  No ST deviation was noted. .  LV perfusion is normal. There is no evidence of ischemia. There is no evidence of infarction. There is a mild inferior wall perfusion defect on rest and stress with an adjacent loop of bowel with significant extracardiac tracer uptake, likely contributing to attenuation artifact. .  Left ventricular function is calculated as abnormal. There were no regional wall motion abnormalities. Nuclear stress EF: 46 %, however visually appears closer to 50-55%. The left ventricular ejection fraction is mildly decreased (45-54%). End diastolic cavity size is normal. End systolic cavity size is normal. .  Prior study available for comparison from 03/13/2021. Prior study unavailable for side by side comparison.    Neuro/Psych Cervical stenosis C3-C6 negative neurological ROS  negative psych ROS   GI/Hepatic Neg liver ROS, GERD  ,Crohn's colitis   Endo/Other  negative endocrine ROS  Renal/GU Renal disease (AKI)     Musculoskeletal negative musculoskeletal ROS (+)   Abdominal   Peds  Hematology negative hematology ROS (+)   Anesthesia Other Findings   Reproductive/Obstetrics                           Anesthesia Physical Anesthesia Plan  ASA: 3  Anesthesia Plan: General   Post-op Pain Management: Tylenol PO  (pre-op)*   Induction: Intravenous  PONV Risk Score and Plan: 3 and Dexamethasone and Ondansetron  Airway Management Planned: Oral ETT and Video Laryngoscope Planned  Additional Equipment: ClearSight  Intra-op Plan:   Post-operative Plan: Extubation in OR  Informed Consent: I have reviewed the patients History and Physical, chart, labs and discussed the procedure including the risks, benefits and alternatives for the proposed anesthesia with the patient or authorized representative who has indicated his/her understanding and acceptance.     Dental advisory given  Plan Discussed with: CRNA  Anesthesia Plan Comments: (2nd PIV  PAT note by Karoline Caldwell, PA-C: Patient follows with cardiologist Dr. Humphrey Rolls at [facility] for history of CAD s/p STEMI 2019 treated with DES to RCA.  Cath 04/2018 showed distal RCA lesion 30% stenosed, otherwise essentially normal coronaries.  Echo 04/2021 showed EF 65%, normal valves.  Stress test 03/2021 done for evaluation of DOE showed, "large severe reversible basal anteroseptal, mid anteroseptal and inferoseptal wall defects, normal wall motion.  Impression: Ischemia in the LAD territory with normal LVEF, consider further work-up if having chest pain."  Per note, CCTA was discussed, however, not pursued due to patient having renal insufficiency.  She was last seen by Dr. Humphrey Rolls 10/31/2021 for preop evaluation.  Per note, "patient doing well cardiac wise.  Patient optimized and cleared for neurosurgery from cardiac standpoint.  Stop Brilinta 5 to 6 days prior."  I did speak with Dr. Humphrey Rolls to clarify stress test findings.  He stated that he felt the findings of the test may have been  artifact/breast attenuation given the patient's lack of chest pain.  He reported that her DOE had improved and he did not feel that it was an anginal equivalent.  He elected to treat her medically and felt that she did not require any additional testing prior to undergoing  surgery.  Preop labs reviewed, creatinine mildly elevated 1.36 consistent with history of CKD, labs otherwise unremarkable.  Due to findings of ischemia in LAD territory on stress test 03/2021, case was reviewed with anesthesiologist Dr. Tobias Alexander and Dr. Deatra Canter.  They both recommended the patient would benefit from additional cardiology evaluation prior to undergoing elective surgery.  Reached out to [facility] cardiology for expedited scheduling.  Patient scheduled to see Dr. Stanford Breed on 12/31/2021 to establish care and for evaluation of CAD.  Dr. Stanford Breed recommended preop nuclear stress test for risk stratification.  He also discontinued the patient's enalapril and metoprolol as she has had issues with hypotension which was being treated with midodrine.  Her Brilinta was also discontinued and she was advised to begin aspirin 81 mg daily following surgery.  Nuclear stress 01/01/2022 was low risk, nonischemic.  EKG 12/31/2021 (per Dr. Jacalyn Lefevre note): NSR with nonspecific ST changes.  Nuclear stress 01/01/2022: . The study is low risk. . No ST deviation was noted. . LV perfusion is normal. There is no evidence of ischemia. There is no evidence of infarction. There is a mild inferior wall perfusion defect on rest and stress with an adjacent loop of bowel with significant extracardiac tracer uptake, likely contributing to attenuation artifact. . Left ventricular function is calculated as abnormal. There were no regional wall motion abnormalities. Nuclear stress EF: 46 %, however visually appears closer to 50-55%. The left ventricular ejection fraction is mildly decreased (45-54%). End diastolic cavity size is normal. End systolic cavity size is normal. . Prior study available for comparison from 03/13/2021. Prior study unavailable for side by side comparison.  Echo 04/2021 ([facility]): EF 65%. Normal chamber sizes. Normal left ventricular systolic function. Normal left ventricular diastolic  dysfunction. Normal right ventricular systolic function. Normal left ventricular wall motion. Normal right ventricular wall motion. Trace tricuspid regurgitation. Normal pulmonary artery pressure. Trace mitral vegetation. No pericardial effusion.  Stress test 03/2021 ([facility]): EKG results: Sinus tachycardia.  122/min.  No significant ST changes at peak exercise. Image quality: Good Perfusion/wall motion findings: EF equals 63%.  Large severe reversible basal anteroseptal, mid anteroseptal and inferoseptal wall defects, normal wall motion. Impression: Ischemia in the LAD territory with normal LVEF, consider further work-up if having chest pain. )      Anesthesia Quick Evaluation

## 2022-01-02 ENCOUNTER — Inpatient Hospital Stay (HOSPITAL_COMMUNITY): Payer: Medicare Other | Admitting: Anesthesiology

## 2022-01-02 ENCOUNTER — Inpatient Hospital Stay (HOSPITAL_COMMUNITY)
Admission: RE | Disposition: A | Discharge status: Home / Self Care | Payer: Self-pay | Source: Home / Self Care | Attending: Neurological Surgery

## 2022-01-02 ENCOUNTER — Inpatient Hospital Stay (HOSPITAL_COMMUNITY): Payer: Medicare Other

## 2022-01-02 ENCOUNTER — Inpatient Hospital Stay (HOSPITAL_COMMUNITY)
Admission: RE | Admit: 2022-01-02 | Discharge: 2022-01-03 | DRG: 472 | Disposition: A | Discharge status: Home / Self Care | Payer: Medicare Other | Attending: Neurological Surgery | Admitting: Neurological Surgery

## 2022-01-02 ENCOUNTER — Inpatient Hospital Stay (HOSPITAL_COMMUNITY): Payer: Medicare Other | Admitting: Physician Assistant

## 2022-01-02 ENCOUNTER — Other Ambulatory Visit: Payer: Self-pay

## 2022-01-02 ENCOUNTER — Encounter (HOSPITAL_COMMUNITY): Payer: Self-pay | Admitting: Neurological Surgery

## 2022-01-02 DIAGNOSIS — N289 Disorder of kidney and ureter, unspecified: Secondary | ICD-10-CM | POA: Diagnosis not present

## 2022-01-02 DIAGNOSIS — I2510 Atherosclerotic heart disease of native coronary artery without angina pectoris: Secondary | ICD-10-CM

## 2022-01-02 DIAGNOSIS — I10 Essential (primary) hypertension: Secondary | ICD-10-CM

## 2022-01-02 DIAGNOSIS — M4802 Spinal stenosis, cervical region: Secondary | ICD-10-CM

## 2022-01-02 DIAGNOSIS — Z981 Arthrodesis status: Principal | ICD-10-CM

## 2022-01-02 DIAGNOSIS — Z79899 Other long term (current) drug therapy: Secondary | ICD-10-CM

## 2022-01-02 DIAGNOSIS — Z87891 Personal history of nicotine dependence: Secondary | ICD-10-CM

## 2022-01-02 DIAGNOSIS — M4712 Other spondylosis with myelopathy, cervical region: Secondary | ICD-10-CM

## 2022-01-02 DIAGNOSIS — E785 Hyperlipidemia, unspecified: Secondary | ICD-10-CM | POA: Diagnosis present

## 2022-01-02 DIAGNOSIS — M4322 Fusion of spine, cervical region: Secondary | ICD-10-CM | POA: Diagnosis not present

## 2022-01-02 DIAGNOSIS — Z7982 Long term (current) use of aspirin: Secondary | ICD-10-CM | POA: Diagnosis not present

## 2022-01-02 DIAGNOSIS — M50021 Cervical disc disorder at C4-C5 level with myelopathy: Secondary | ICD-10-CM | POA: Diagnosis present

## 2022-01-02 DIAGNOSIS — K219 Gastro-esophageal reflux disease without esophagitis: Secondary | ICD-10-CM | POA: Diagnosis present

## 2022-01-02 DIAGNOSIS — I252 Old myocardial infarction: Secondary | ICD-10-CM

## 2022-01-02 DIAGNOSIS — J3489 Other specified disorders of nose and nasal sinuses: Secondary | ICD-10-CM | POA: Diagnosis not present

## 2022-01-02 HISTORY — PX: ANTERIOR CERVICAL DECOMP/DISCECTOMY FUSION: SHX1161

## 2022-01-02 LAB — ABO/RH: ABO/RH(D): O POS

## 2022-01-02 SURGERY — ANTERIOR CERVICAL DECOMPRESSION/DISCECTOMY FUSION 3 LEVELS
Anesthesia: General | Site: Spine Cervical

## 2022-01-02 MED ORDER — 0.9 % SODIUM CHLORIDE (POUR BTL) OPTIME
TOPICAL | Status: DC | PRN
  Administered 2022-01-02: 1000 mL

## 2022-01-02 MED ORDER — THROMBIN 5000 UNITS EX SOLR
CUTANEOUS | Status: AC
  Filled 2022-01-02: qty 10000

## 2022-01-02 MED ORDER — ALUM & MAG HYDROXIDE-SIMETH 200-200-20 MG/5ML PO SUSP: 30.0000 mL | Freq: Four times a day (QID) | ORAL | Status: DC | PRN

## 2022-01-02 MED ORDER — DEXAMETHASONE 4 MG PO TABS
4.0000 mg | ORAL_TABLET | Freq: Four times a day (QID) | ORAL | Status: DC
  Administered 2022-01-02 – 2022-01-03 (×4): 4 mg via ORAL
  Filled 2022-01-02 (×4): qty 1

## 2022-01-02 MED ORDER — SODIUM BICARBONATE 650 MG PO TABS
1950.0000 mg | ORAL_TABLET | Freq: Two times a day (BID) | ORAL | Status: DC
  Administered 2022-01-02 – 2022-01-03 (×2): 1950 mg via ORAL
  Filled 2022-01-02 (×2): qty 3

## 2022-01-02 MED ORDER — PHENYLEPHRINE 80 MCG/ML (10ML) SYRINGE FOR IV PUSH (FOR BLOOD PRESSURE SUPPORT)
PREFILLED_SYRINGE | INTRAVENOUS | Status: AC
  Filled 2022-01-02: qty 10

## 2022-01-02 MED ORDER — MIDAZOLAM HCL 2 MG/2ML IJ SOLN
INTRAMUSCULAR | Status: DC | PRN
  Administered 2022-01-02: 2 mg via INTRAVENOUS

## 2022-01-02 MED ORDER — PROPOFOL 10 MG/ML IV BOLUS
INTRAVENOUS | Status: AC
  Filled 2022-01-02: qty 20

## 2022-01-02 MED ORDER — SODIUM CHLORIDE 0.9 % IV SOLN: 250.0000 mL | INTRAVENOUS | Status: DC

## 2022-01-02 MED ORDER — MENTHOL 3 MG MT LOZG: 1.0000 | LOZENGE | OROMUCOSAL | Status: DC | PRN

## 2022-01-02 MED ORDER — PHENYLEPHRINE HCL-NACL 20-0.9 MG/250ML-% IV SOLN
INTRAVENOUS | Status: DC | PRN
  Administered 2022-01-02: 50 ug/min via INTRAVENOUS

## 2022-01-02 MED ORDER — CEFAZOLIN SODIUM-DEXTROSE 2-4 GM/100ML-% IV SOLN
2.0000 g | INTRAVENOUS | Status: AC
  Administered 2022-01-02: 2 g via INTRAVENOUS
  Filled 2022-01-02: qty 100

## 2022-01-02 MED ORDER — MORPHINE SULFATE (PF) 2 MG/ML IV SOLN: 2.0000 mg | INTRAVENOUS | Status: DC | PRN

## 2022-01-02 MED ORDER — THROMBIN (RECOMBINANT) 5000 UNITS EX SOLR
CUTANEOUS | Status: DC | PRN
  Administered 2022-01-02: 10 mL via TOPICAL

## 2022-01-02 MED ORDER — PROPOFOL 10 MG/ML IV BOLUS
INTRAVENOUS | Status: DC | PRN
  Administered 2022-01-02: 90 mg via INTRAVENOUS

## 2022-01-02 MED ORDER — DEXAMETHASONE SODIUM PHOSPHATE 10 MG/ML IJ SOLN
INTRAMUSCULAR | Status: DC | PRN
  Administered 2022-01-02: 10 mg via INTRAVENOUS

## 2022-01-02 MED ORDER — CEFAZOLIN SODIUM-DEXTROSE 2-4 GM/100ML-% IV SOLN
2.0000 g | Freq: Three times a day (TID) | INTRAVENOUS | Status: AC
  Administered 2022-01-02 (×2): 2 g via INTRAVENOUS
  Filled 2022-01-02 (×2): qty 100

## 2022-01-02 MED ORDER — ROCURONIUM 10MG/ML (10ML) SYRINGE FOR MEDFUSION PUMP - OPTIME
INTRAVENOUS | Status: DC | PRN
  Administered 2022-01-02: 100 mg via INTRAVENOUS

## 2022-01-02 MED ORDER — GLYCOPYRROLATE PF 0.2 MG/ML IJ SOSY
PREFILLED_SYRINGE | INTRAMUSCULAR | Status: AC
  Filled 2022-01-02: qty 1

## 2022-01-02 MED ORDER — CYCLOBENZAPRINE HCL 10 MG PO TABS
10.0000 mg | ORAL_TABLET | Freq: Every day | ORAL | Status: DC
  Administered 2022-01-02: 10 mg via ORAL
  Filled 2022-01-02: qty 1

## 2022-01-02 MED ORDER — GABAPENTIN 300 MG PO CAPS
300.0000 mg | ORAL_CAPSULE | ORAL | Status: DC
  Filled 2022-01-02: qty 1

## 2022-01-02 MED ORDER — ROCURONIUM BROMIDE 10 MG/ML (PF) SYRINGE
PREFILLED_SYRINGE | INTRAVENOUS | Status: AC
  Filled 2022-01-02: qty 10

## 2022-01-02 MED ORDER — BUPIVACAINE HCL (PF) 0.25 % IJ SOLN
INTRAMUSCULAR | Status: AC
  Filled 2022-01-02: qty 30

## 2022-01-02 MED ORDER — CHLORHEXIDINE GLUCONATE CLOTH 2 % EX PADS: 6.0000 | MEDICATED_PAD | Freq: Once | CUTANEOUS | Status: DC

## 2022-01-02 MED ORDER — SUGAMMADEX SODIUM 200 MG/2ML IV SOLN
INTRAVENOUS | Status: DC | PRN
  Administered 2022-01-02: 200 mg via INTRAVENOUS

## 2022-01-02 MED ORDER — EZETIMIBE 10 MG PO TABS
10.0000 mg | ORAL_TABLET | Freq: Every day | ORAL | Status: DC
  Administered 2022-01-02: 10 mg via ORAL
  Filled 2022-01-02: qty 1

## 2022-01-02 MED ORDER — SENNA 8.6 MG PO TABS
1.0000 | ORAL_TABLET | Freq: Two times a day (BID) | ORAL | Status: DC
  Administered 2022-01-02 – 2022-01-03 (×2): 8.6 mg via ORAL
  Filled 2022-01-02 (×2): qty 1

## 2022-01-02 MED ORDER — LACTATED RINGERS IV SOLN: INTRAVENOUS | Status: DC

## 2022-01-02 MED ORDER — LIDOCAINE 2% (20 MG/ML) 5 ML SYRINGE
INTRAMUSCULAR | Status: AC
  Filled 2022-01-02: qty 5

## 2022-01-02 MED ORDER — MIDAZOLAM HCL 2 MG/2ML IJ SOLN
INTRAMUSCULAR | Status: AC
  Filled 2022-01-02: qty 2

## 2022-01-02 MED ORDER — ONDANSETRON HCL 4 MG/2ML IJ SOLN
INTRAMUSCULAR | Status: AC
  Filled 2022-01-02: qty 2

## 2022-01-02 MED ORDER — THROMBIN 5000 UNITS EX SOLR
CUTANEOUS | Status: AC
  Filled 2022-01-02: qty 5000

## 2022-01-02 MED ORDER — MIDODRINE HCL 5 MG PO TABS
5.0000 mg | ORAL_TABLET | Freq: Three times a day (TID) | ORAL | Status: DC
  Administered 2022-01-03: 5 mg via ORAL
  Filled 2022-01-02 (×4): qty 1

## 2022-01-02 MED ORDER — SUFENTANIL CITRATE 50 MCG/ML IV SOLN
INTRAVENOUS | Status: DC | PRN
  Administered 2022-01-02: 20 ug via INTRAVENOUS
  Administered 2022-01-02 (×2): 10 ug via INTRAVENOUS

## 2022-01-02 MED ORDER — FENTANYL CITRATE (PF) 100 MCG/2ML IJ SOLN
25.0000 ug | INTRAMUSCULAR | Status: DC | PRN
  Administered 2022-01-02 (×3): 50 ug via INTRAVENOUS

## 2022-01-02 MED ORDER — THROMBIN 5000 UNITS EX SOLR
OROMUCOSAL | Status: DC | PRN
  Administered 2022-01-02: 5 mL via TOPICAL

## 2022-01-02 MED ORDER — ORAL CARE MOUTH RINSE: 15.0000 mL | Freq: Once | OROMUCOSAL | Status: AC

## 2022-01-02 MED ORDER — SENNOSIDES-DOCUSATE SODIUM 8.6-50 MG PO TABS: 1.0000 | ORAL_TABLET | Freq: Every evening | ORAL | Status: DC | PRN

## 2022-01-02 MED ORDER — OXYCODONE HCL 5 MG PO TABS
5.0000 mg | ORAL_TABLET | ORAL | Status: DC | PRN
  Administered 2022-01-02 – 2022-01-03 (×5): 5 mg via ORAL
  Filled 2022-01-02 (×5): qty 1

## 2022-01-02 MED ORDER — LACTATED RINGERS IV SOLN: INTRAVENOUS | Status: DC | PRN

## 2022-01-02 MED ORDER — FENTANYL CITRATE (PF) 100 MCG/2ML IJ SOLN
INTRAMUSCULAR | Status: AC
  Filled 2022-01-02: qty 2

## 2022-01-02 MED ORDER — PHENYLEPHRINE HCL (PRESSORS) 10 MG/ML IV SOLN
INTRAVENOUS | Status: DC | PRN
  Administered 2022-01-02 (×2): 80 ug via INTRAVENOUS

## 2022-01-02 MED ORDER — GABAPENTIN 400 MG PO CAPS
400.0000 mg | ORAL_CAPSULE | Freq: Every day | ORAL | Status: DC
  Administered 2022-01-03: 400 mg via ORAL
  Filled 2022-01-02: qty 1

## 2022-01-02 MED ORDER — SODIUM CHLORIDE (PF) 0.9 % IJ SOLN
INTRAMUSCULAR | Status: AC
  Filled 2022-01-02: qty 10

## 2022-01-02 MED ORDER — BUPIVACAINE HCL (PF) 0.25 % IJ SOLN
INTRAMUSCULAR | Status: DC | PRN
  Administered 2022-01-02: 5 mL

## 2022-01-02 MED ORDER — DEXAMETHASONE SODIUM PHOSPHATE 4 MG/ML IJ SOLN: 4.0000 mg | Freq: Four times a day (QID) | INTRAMUSCULAR | Status: DC

## 2022-01-02 MED ORDER — ONDANSETRON HCL 4 MG/2ML IJ SOLN
INTRAMUSCULAR | Status: DC | PRN
  Administered 2022-01-02: 4 mg via INTRAVENOUS

## 2022-01-02 MED ORDER — SODIUM CHLORIDE 0.9% FLUSH: 3.0000 mL | INTRAVENOUS | Status: DC | PRN

## 2022-01-02 MED ORDER — ACETAMINOPHEN 500 MG PO TABS
1000.0000 mg | ORAL_TABLET | ORAL | Status: AC
  Administered 2022-01-02: 1000 mg via ORAL
  Filled 2022-01-02: qty 2

## 2022-01-02 MED ORDER — CHLORHEXIDINE GLUCONATE 0.12 % MT SOLN
15.0000 mL | Freq: Once | OROMUCOSAL | Status: AC
  Administered 2022-01-02: 15 mL via OROMUCOSAL
  Filled 2022-01-02: qty 15

## 2022-01-02 MED ORDER — VANCOMYCIN HCL IN DEXTROSE 1-5 GM/200ML-% IV SOLN
1000.0000 mg | INTRAVENOUS | Status: AC
  Administered 2022-01-02: 1000 mg via INTRAVENOUS
  Filled 2022-01-02: qty 200

## 2022-01-02 MED ORDER — LIDOCAINE 2% (20 MG/ML) 5 ML SYRINGE
INTRAMUSCULAR | Status: DC | PRN
  Administered 2022-01-02: 75 mg via INTRAVENOUS

## 2022-01-02 MED ORDER — SUFENTANIL CITRATE 50 MCG/ML IV SOLN
INTRAVENOUS | Status: AC
  Filled 2022-01-02: qty 1

## 2022-01-02 MED ORDER — PHENOL 1.4 % MT LIQD: 1.0000 | OROMUCOSAL | Status: DC | PRN

## 2022-01-02 MED ORDER — SODIUM CHLORIDE 0.9% FLUSH
3.0000 mL | Freq: Two times a day (BID) | INTRAVENOUS | Status: DC
  Administered 2022-01-02: 3 mL via INTRAVENOUS

## 2022-01-02 MED ORDER — GLYCOPYRROLATE 0.2 MG/ML IJ SOLN
INTRAMUSCULAR | Status: DC | PRN
  Administered 2022-01-02 (×2): .2 mg via INTRAVENOUS

## 2022-01-02 MED ORDER — NITROGLYCERIN 0.4 MG SL SUBL: 0.4000 mg | SUBLINGUAL_TABLET | SUBLINGUAL | Status: DC | PRN

## 2022-01-02 MED ORDER — ACETAMINOPHEN 500 MG PO TABS
1000.0000 mg | ORAL_TABLET | Freq: Four times a day (QID) | ORAL | Status: AC
  Administered 2022-01-02 – 2022-01-03 (×4): 1000 mg via ORAL
  Filled 2022-01-02 (×4): qty 2

## 2022-01-02 MED ORDER — DEXAMETHASONE SODIUM PHOSPHATE 10 MG/ML IJ SOLN
INTRAMUSCULAR | Status: AC
  Filled 2022-01-02: qty 1

## 2022-01-02 MED ORDER — CHOLESTYRAMINE 4 G PO PACK: 4.0000 g | PACK | Freq: Every day | ORAL | Status: DC

## 2022-01-02 MED ORDER — POTASSIUM CHLORIDE IN NACL 20-0.9 MEQ/L-% IV SOLN: INTRAVENOUS | Status: DC

## 2022-01-02 SURGICAL SUPPLY — 51 items
BAG COUNTER SPONGE SURGICOUNT (BAG) ×1 IMPLANT
BAND RUBBER #18 3X1/16 STRL (MISCELLANEOUS) ×2 IMPLANT
BASKET BONE COLLECTION (BASKET) IMPLANT
BENZOIN TINCTURE PRP APPL 2/3 (GAUZE/BANDAGES/DRESSINGS) ×1 IMPLANT
BIT DRILL 2.3X12 (BIT) IMPLANT
BUR CARBIDE MATCH 3.0 (BURR) ×1 IMPLANT
CANISTER SUCT 3000ML PPV (MISCELLANEOUS) ×1 IMPLANT
CLSR STERI-STRIP ANTIMIC 1/2X4 (GAUZE/BANDAGES/DRESSINGS) IMPLANT
DRAPE C-ARM 42X72 X-RAY (DRAPES) ×2 IMPLANT
DRAPE LAPAROTOMY 100X72 PEDS (DRAPES) ×1 IMPLANT
DRAPE MICROSCOPE SLANT 54X150 (MISCELLANEOUS) ×1 IMPLANT
DRSG OPSITE POSTOP 4X6 (GAUZE/BANDAGES/DRESSINGS) IMPLANT
DURAPREP 6ML APPLICATOR 50/CS (WOUND CARE) ×1 IMPLANT
ELECT COATED BLADE 2.86 ST (ELECTRODE) ×1 IMPLANT
ELECT REM PT RETURN 9FT ADLT (ELECTROSURGICAL) ×1
ELECTRODE REM PT RTRN 9FT ADLT (ELECTROSURGICAL) ×1 IMPLANT
GAUZE 4X4 16PLY ~~LOC~~+RFID DBL (SPONGE) IMPLANT
GLOVE BIO SURGEON STRL SZ7 (GLOVE) IMPLANT
GLOVE BIO SURGEON STRL SZ8 (GLOVE) ×1 IMPLANT
GLOVE BIOGEL PI IND STRL 7.0 (GLOVE) IMPLANT
GOWN STRL REUS W/ TWL LRG LVL3 (GOWN DISPOSABLE) IMPLANT
GOWN STRL REUS W/ TWL XL LVL3 (GOWN DISPOSABLE) ×1 IMPLANT
GOWN STRL REUS W/TWL 2XL LVL3 (GOWN DISPOSABLE) IMPLANT
GOWN STRL REUS W/TWL LRG LVL3 (GOWN DISPOSABLE) ×2
GOWN STRL REUS W/TWL XL LVL3 (GOWN DISPOSABLE) ×1
HEMOSTAT POWDER KIT SURGIFOAM (HEMOSTASIS) ×1 IMPLANT
KIT BASIN OR (CUSTOM PROCEDURE TRAY) ×1 IMPLANT
KIT TURNOVER KIT B (KITS) ×1 IMPLANT
NDL HYPO 25X1 1.5 SAFETY (NEEDLE) ×1 IMPLANT
NDL SPNL 20GX3.5 QUINCKE YW (NEEDLE) ×1 IMPLANT
NEEDLE HYPO 25X1 1.5 SAFETY (NEEDLE) ×1 IMPLANT
NEEDLE SPNL 20GX3.5 QUINCKE YW (NEEDLE) ×1 IMPLANT
NS IRRIG 1000ML POUR BTL (IV SOLUTION) ×1 IMPLANT
PACK LAMINECTOMY NEURO (CUSTOM PROCEDURE TRAY) ×1 IMPLANT
PAD ARMBOARD 7.5X6 YLW CONV (MISCELLANEOUS) ×1 IMPLANT
PIN DISTRACTION 14MM (PIN) IMPLANT
PLATE LOCK ACP INSIG 56 3L (Plate) IMPLANT
PUTTY BONE 1CC (Putty) IMPLANT
SCREW VA SINGLE LEAD 4X14 (Screw) ×8 IMPLANT
SCREW VA SINGLE LEAD 4X14 ST (Screw) IMPLANT
SPACER IDENTITI 6X16X14 7D (Screw) IMPLANT
SPACER IDENTITI 7X16X14 7D (Spacer) IMPLANT
SPONGE INTESTINAL PEANUT (DISPOSABLE) ×1 IMPLANT
SPONGE SURGIFOAM ABS GEL 100 (HEMOSTASIS) IMPLANT
SPONGE SURGIFOAM ABS GEL SZ50 (HEMOSTASIS) IMPLANT
STRIP CLOSURE SKIN 1/2X4 (GAUZE/BANDAGES/DRESSINGS) ×1 IMPLANT
SUT VIC AB 3-0 SH 8-18 (SUTURE) ×1 IMPLANT
SUT VICRYL 4-0 PS2 18IN ABS (SUTURE) IMPLANT
TOWEL GREEN STERILE (TOWEL DISPOSABLE) ×1 IMPLANT
TOWEL GREEN STERILE FF (TOWEL DISPOSABLE) ×1 IMPLANT
WATER STERILE IRR 1000ML POUR (IV SOLUTION) ×1 IMPLANT

## 2022-01-02 NOTE — Consult Note (Signed)
Reason for Consult: Check vocal cord function Referring Physician: Eustace Demartin, MD  Anne [name] is an 66 y.o. female.  HPI: History of anterior cervical spine surgery in the past, here today for second surgery on the opposite side.  She was supposed to see otolaryngology preoperatively to have the vocal cords checked but she did not make her appointment.  She is here today for surgery and request was made for consultation to evaluate the vocal cords.  She has received some intravenous sedatives so she is not really able to give much history.  Her breathing and voice are clear.  Past Medical History:  Diagnosis Date   Coronary artery disease    Crohn's colitis ([place])    GERD (gastroesophageal reflux disease)    Hyperlipidemia    Hypokalemia 08/04/2019   STEMI (ST elevation myocardial infarction) ([place])    10/27/17 PCI/DES x1 to the dRCA, with residual thrombus in the PLA, normal EF    Past Surgical History:  Procedure Laterality Date   BIOPSY  09/22/2019   Procedure: BIOPSY;  Surgeon: Anne Ada, MD;  Location: [facility];  Service: Endoscopy;;   CHOLECYSTECTOMY  2005   COLONOSCOPY WITH PROPOFOL N/A 09/22/2019   Procedure: COLONOSCOPY WITH PROPOFOL;  Surgeon: Anne Ada, MD;  Location: [facility];  Service: Endoscopy;  Laterality: N/A;   CORONARY/GRAFT ACUTE MI REVASCULARIZATION N/A 10/26/2017   Procedure: Coronary/Graft Acute MI Revascularization;  Surgeon: Anne Sine, MD;  Location: [place] [facility];  Service: Cardiovascular;  Laterality: N/A;   FLEXIBLE SIGMOIDOSCOPY N/A 12/12/2021   Procedure: FLEXIBLE SIGMOIDOSCOPY;  Surgeon: Anne Ada, MD;  [facility];  Service: Gastroenterology;  Laterality: N/A;   LEFT HEART CATH AND CORONARY ANGIOGRAPHY N/A 10/26/2017   Procedure: LEFT HEART CATH AND CORONARY ANGIOGRAPHY;  Surgeon: Anne Sine, MD;  Location: [place] [facility];  Service: Cardiovascular;  Laterality: N/A;   LEFT HEART CATH AND CORONARY  ANGIOGRAPHY Left 05/05/2018   Procedure: Left heart cath and coronary angiography;  Surgeon: Anne David, MD;  Location: [place] [facility];  Service: Cardiovascular;  Laterality: Left;   MUSCLE BIOPSY Left 08/10/2019   Procedure: THIGH MUSCLE BIOPSY;  Surgeon: Anne Keens, MD;  Location: [place];  Service: General;  Laterality: Left;   SMALL INTESTINE SURGERY     TONSILLECTOMY     removed as a child    Family History  Problem Relation Age of Onset   Stroke Mother    Crohn's disease Neg Hx     Social History:  reports that she quit smoking about 6 weeks ago. Her smoking use included cigarettes. She has a 11.25 pack-year smoking history. She has never used smokeless tobacco. She reports that she does not drink alcohol and does not use drugs.  Allergies: No Known Allergies  Medications: Reviewed  Results for orders placed or performed during the hospital encounter of 01/02/22 (from the past 48 hour(s))  ABO/Rh     Status: None   Collection Time: 01/02/22  6:30 AM  Result Value Ref Range   ABO/RH(D)      O POS Performed at [facility] [address], [place],  [address]     MYOCARDIAL PERFUSION IMAGING  Result Date: 01/01/2022   The study is low risk.   No ST deviation was noted.   LV perfusion is normal. There is no evidence of ischemia. There is no evidence of infarction. There is a mild inferior wall perfusion defect on rest and  stress with an adjacent loop of bowel with significant extracardiac tracer uptake, likely contributing to attenuation artifact.   Left ventricular function is calculated as abnormal. There were no regional wall motion abnormalities. Nuclear stress EF: 46 %, however visually appears closer to 50-55%. The left ventricular ejection fraction is mildly decreased (45-54%). End diastolic cavity size is normal. End systolic cavity size is normal.   Prior study available for comparison from 03/13/2021. Prior study unavailable for side by side  comparison.    VAP:OLIDCVUD except as listed in admit H&P  Blood pressure 116/71, pulse 60, temperature 98 F (36.7 C), temperature source Oral, resp. rate 17, height 5' 9"  (1.753 m), weight 70.3 kg, last menstrual period 02/18/2001, SpO2 99 %.  PHYSICAL EXAM: Overall appearance:  Healthy appearing, in no distress Head:  Normocephalic, atraumatic. Ears: External ears look healthy. Nose: External nose is healthy in appearance. Internal nasal exam reveals a septal perforation without any signs of infection. Oral Cavity/Pharynx:  There are no mucosal lesions or masses identified. Larynx/Hypopharynx: See below Neuro:  No identifiable neurologic deficits. Neck: No palpable neck masses.  Studies Reviewed: none  Procedures:  flexible fiberoptic endoscopy was uniformed to the right nasal cavity following topical application of Xylocaine/Afrin.  The laryngeal structures appeared normal.  The vocal cord function is normal and symmetric.  No lesions identified.   Assessment/Plan: Septal perforation.  Normal vocal cord function bilaterally.  Proceed with neurosurgical procedure.  Medical Decision Making: #/Complex Problems: 2  Data Reviewed:1  Management:2 (1-Straightforward, 2-Low, 3-Moderate, 4-High)  J34.89   Anne [name] 01/02/2022, 7:41 AM

## 2022-01-02 NOTE — Anesthesia Postprocedure Evaluation (Signed)
Anesthesia Post Note  Patient: Anne [name]  Procedure(s) Performed: Anterior Cervical Decompression Fusion - Cervical three-Cervical four - Cervical four-Cervical five - Cervical five-Cervical six (Spine Cervical)     Patient location during evaluation: PACU Anesthesia Type: General Level of consciousness: awake and alert Pain management: pain level controlled Vital Signs Assessment: post-procedure vital signs reviewed and stable Respiratory status: spontaneous breathing, nonlabored ventilation, respiratory function stable and patient connected to nasal cannula oxygen Cardiovascular status: blood pressure returned to baseline and stable Postop Assessment: no apparent nausea or vomiting Anesthetic complications: no   No notable events documented.  Last Vitals:  Vitals:   01/02/22 1224 01/02/22 1653  BP: (!) 93/57 118/71  Pulse: 69 77  Resp: 16 16  Temp:  36.7 C  SpO2: 95% 99%    Last Pain:  Vitals:   01/02/22 1653  TempSrc: Oral  PainSc:                  [name]

## 2022-01-02 NOTE — Transfer of Care (Signed)
Immediate Anesthesia Transfer of Care Note  Patient: SYVANNA CIOLINO  Procedure(s) Performed: Anterior Cervical Decompression Fusion - Cervical three-Cervical four - Cervical four-Cervical five - Cervical five-Cervical six (Spine Cervical)  Patient Location: PACU  Anesthesia Type:General  Level of Consciousness: oriented, drowsy, patient cooperative and responds to stimulation  Airway & Oxygen Therapy: Patient Spontanous Breathing and Patient connected to nasal cannula oxygen  Post-op Assessment: Report given to RN, Post -op Vital signs reviewed and stable and Patient moving all extremities X 4  Post vital signs: Reviewed and stable  Last Vitals:  Vitals Value Taken Time  BP 113/102 01/02/22 1057  Temp 36.3 C 01/02/22 1057  Pulse 84 01/02/22 1059  Resp 16 01/02/22 1059  SpO2 98 % 01/02/22 1059  Vitals shown include unvalidated device data.  Last Pain:  Vitals:   01/02/22 0711  TempSrc:   PainSc: 0-No pain      Patients Stated Pain Goal: 3 (72/90/21 1155)  Complications: No notable events documented.

## 2022-01-02 NOTE — Anesthesia Procedure Notes (Signed)
Procedure Name: Intubation Date/Time: 01/02/2022 7:49 AM  Performed by: Claris Che, CRNAPre-anesthesia Checklist: Patient identified, Emergency Drugs available, Suction available, Patient being monitored and Timeout performed Patient Re-evaluated:Patient Re-evaluated prior to induction Oxygen Delivery Method: Circle system utilized Preoxygenation: Pre-oxygenation with 100% oxygen Induction Type: IV induction and Cricoid Pressure applied Ventilation: Mask ventilation without difficulty Laryngoscope Size: 4 and Glidescope Grade View: Grade I Tube type: Oral Tube size: 7.5 mm Number of attempts: 1 Airway Equipment and Method: Stylet and Video-laryngoscopy Placement Confirmation: ETT inserted through vocal cords under direct vision, positive ETCO2 and breath sounds checked- equal and bilateral Secured at: 22 cm Tube secured with: Tape Dental Injury: Teeth and Oropharynx as per pre-operative assessment

## 2022-01-02 NOTE — H&P (Signed)
Subjective:   Patient is a 66 y.o. female admitted for cervical stenosis. The patient first presented to me with complaints of neck pain, shooting pains in the arm(s), and numbness of the arm(s). Onset of symptoms was several months ago. The pain is described as aching and occurs all day. The pain is rated moderate, and is located in the neck and radiates to the arms. The symptoms have been progressive. Symptoms are exacerbated by extending head backwards, and are relieved by none.  Previous work up includes MRI of cervical spine, results: spinal stenosis.  Past Medical History:  Diagnosis Date   Coronary artery disease    Crohn's colitis ([place])    GERD (gastroesophageal reflux disease)    Hyperlipidemia    Hypokalemia 08/04/2019   STEMI (ST elevation myocardial infarction) ([place])    10/27/17 PCI/DES x1 to the dRCA, with residual thrombus in the PLA, normal EF    Past Surgical History:  Procedure Laterality Date   BIOPSY  09/22/2019   Procedure: BIOPSY;  Surgeon: Carol Ada, MD;  [facility];  Service: Endoscopy;;   CHOLECYSTECTOMY  2005   COLONOSCOPY WITH PROPOFOL N/A 09/22/2019   Procedure: COLONOSCOPY WITH PROPOFOL;  Surgeon: Carol Ada, MD;  [facility];  Service: Endoscopy;  Laterality: N/A;   CORONARY/GRAFT ACUTE MI REVASCULARIZATION N/A 10/26/2017   Procedure: Coronary/Graft Acute MI Revascularization;  Surgeon: Troy Sine, MD;  Location: [place] [facility];  Service: Cardiovascular;  Laterality: N/A;   FLEXIBLE SIGMOIDOSCOPY N/A 12/12/2021   Procedure: FLEXIBLE SIGMOIDOSCOPY;  Surgeon: Carol Ada, MD;  [facility];  Service: Gastroenterology;  Laterality: N/A;   LEFT HEART CATH AND CORONARY ANGIOGRAPHY N/A 10/26/2017   Procedure: LEFT HEART CATH AND CORONARY ANGIOGRAPHY;  Surgeon: Troy Sine, MD;  Location: [place] [facility];  Service: Cardiovascular;  Laterality: N/A;   LEFT HEART CATH AND CORONARY ANGIOGRAPHY Left 05/05/2018    Procedure: Left heart cath and coronary angiography;  Surgeon: Dionisio , MD;  Location: [place] [facility];  Service: Cardiovascular;  Laterality: Left;   MUSCLE BIOPSY Left 08/10/2019   Procedure: THIGH MUSCLE BIOPSY;  Surgeon: Coralie Keens, MD;  Location: [place];  Service: General;  Laterality: Left;   SMALL INTESTINE SURGERY     TONSILLECTOMY     removed as a child    No Known Allergies  Social History   Tobacco Use   Smoking status: Former    Packs/day: 0.25    Years: 45.00    Total pack years: 11.25    Types: Cigarettes    Quit date: 11/18/2021    Years since quitting: 0.1   Smokeless tobacco: Never  Substance Use Topics   Alcohol use: No    Family History  Problem Relation Age of Onset   Stroke Mother    Crohn's disease Neg Hx    Prior to Admission medications   Medication Sig Start Date End Date Taking? Authorizing Provider  acetaminophen-codeine (TYLENOL #4) 300-60 MG tablet Take 2 tablets by mouth 2 (two) times daily as needed. 11/19/21  Yes [provider]  cholestyramine (QUESTRAN) 4 g packet Take 4 g by mouth daily.   Yes [provider]  cyclobenzaprine (FLEXERIL) 10 MG tablet Take 10 mg by mouth at bedtime. 08/09/19  Yes [provider]  ezetimibe (ZETIA) 10 MG tablet Take 10 mg by mouth at bedtime. 09/05/21  Yes [provider]  gabapentin (NEURONTIN) 400 MG capsule Take 400 mg by mouth daily.   Yes  [provider]  OxyCODONE HCl, Abuse Deter, (OXAYDO) 5 MG TABA Take 5 mg by mouth 2 (two) times daily as needed (pain).   Yes [provider]  rosuvastatin (CRESTOR) 40 MG tablet Take 1 tablet (40 mg total) by mouth daily. 02/20/20  Yes Vann, Jessica U, DO  sodium bicarbonate 650 MG tablet Take 3 tablets (1,950 mg total) by mouth 2 (two) times daily. 08/12/19  Yes Asencion Noble, MD  aspirin EC 81 MG tablet Take 1 tablet (81 mg total) by mouth daily. Swallow whole. 12/31/21   Lelon Perla, MD   INFLIXIMAB IV Inject 1 Dose into the vein every 8 (eight) weeks.    [provider]  midodrine (PROAMATINE) 5 MG tablet Take 1 tablet (5 mg total) by mouth 3 (three) times daily with meals. Patient taking differently: Take 5 mg by mouth daily as needed (Take if blood pressure is low). 02/06/20   Geradine Girt, DO  nitroGLYCERIN (NITROSTAT) 0.4 MG SL tablet Place 1 tablet (0.4 mg total) under the tongue every 5 (five) minutes x 3 doses as needed for chest pain. 10/28/17   Cheryln Manly, NP     Review of Systems  Positive ROS: neg  All other systems have been reviewed and were otherwise negative with the exception of those mentioned in the HPI and as above.  Objective: Vital signs in last 24 hours: Temp:  [98 F (36.7 C)] 98 F (36.7 C) (09/22 0605) Pulse Rate:  [60] 60 (09/22 0605) Resp:  [17] 17 (09/22 0605) BP: (116)/(71) 116/71 (09/22 0605) SpO2:  [99 %] 99 % (09/22 0605) Weight:  [70.3 kg] 70.3 kg (09/22 0605)  General Appearance: Alert, cooperative, no distress, appears stated age Head: Normocephalic, without obvious abnormality, atraumatic Eyes: PERRL, conjunctiva/corneas clear, EOM's intact      Neck: Supple, symmetrical, trachea midline, Back: Symmetric, no curvature, ROM normal, no CVA tenderness Lungs:  respirations unlabored Heart: Regular rate and rhythm Abdomen: Soft, non-tender Extremities: Extremities normal, atraumatic, no cyanosis or edema Pulses: 2+ and symmetric all extremities Skin: Skin color, texture, turgor normal, no rashes or lesions  NEUROLOGIC:  Mental status: Alert and oriented x4, no aphasia, good attention span, fund of knowledge and memory  Motor Exam - grossly normal Sensory Exam - grossly normal Reflexes: 1+ Coordination - grossly normal Gait - grossly normal Balance - grossly normal Cranial Nerves: I: smell Not tested  II: visual acuity  OS: nl    OD: nl  II: visual fields Full to confrontation  II: pupils Equal, round,  reactive to light  III,VII: ptosis None  III,IV,VI: extraocular muscles  Full ROM  V: mastication Normal  V: facial light touch sensation  Normal  V,VII: corneal reflex  Present  VII: facial muscle function - upper  Normal  VII: facial muscle function - lower Normal  VIII: hearing Not tested  IX: soft palate elevation  Normal  IX,X: gag reflex Present  XI: trapezius strength  5/5  XI: sternocleidomastoid strength 5/5  XI: neck flexion strength  5/5  XII: tongue strength  Normal    Data Review Lab Results  Component Value Date   WBC 6.7 12/26/2021   HGB 14.0 12/26/2021   HCT 43.4 12/26/2021   MCV 97.3 12/26/2021   PLT 285 12/26/2021   Lab Results  Component Value Date   NA 140 12/26/2021   K 3.9 12/26/2021   CL 114 (H) 12/26/2021   CO2 20 (L) 12/26/2021   BUN 18  12/26/2021   CREATININE 1.36 (H) 12/26/2021   GLUCOSE 98 12/26/2021   Lab Results  Component Value Date   INR 1.1 12/26/2021    Assessment:   Cervical neck pain with herniated nucleus pulposus/ spondylosis/ stenosis at C3-4 c4-5 c5-6. Estimated body mass index is 22.89 kg/m as calculated from the following:   Height as of this encounter: 5' 9"  (1.753 m).   Weight as of this encounter: 70.3 kg.  Patient has failed conservative therapy. Planned surgery : ACDF C3-4 C4-5 C5-6  Plan:   I explained the condition and procedure to the patient and answered any questions.  Patient wishes to proceed with procedure as planned. Understands risks/ benefits/ and expected or typical outcomes.  Eustace Nedeau 01/02/2022 7:16 AM

## 2022-01-02 NOTE — Op Note (Signed)
01/02/2022  10:52 AM  PATIENT:  Anne [name]  66 y.o. female  PRE-OPERATIVE DIAGNOSIS: Cervical spondylosis with cervical spinal stenosis C3-4 C4-5 C5-6 with cervical spondylitic myelopathy  POST-OPERATIVE DIAGNOSIS:  same  PROCEDURE:  1. Decompressive anterior cervical discectomy C3-4 C4-5 C5-6, 2. Anterior cervical arthrodesis C3-4 C4-5 C5-6 utilizing a PTI interbody cage packed with locally harvested morcellized autologous bone graft and DBM putty, 3. Anterior cervical plating C3-C6 inclusive utilizing a Alphatec plate  SURGEON:  Sherley Bounds, MD  ASSISTANTS: Glenford Peers FNP  ANESTHESIA:   General  EBL: 200 ml  Total I/O In: 1000 [I.V.:1000] Out: 200 [Blood:200]  BLOOD ADMINISTERED: none  DRAINS: none  SPECIMEN:  none  INDICATION FOR PROCEDURE: This patient presented with neck pain with left arm pain. Imaging showed significant spinal stenosis with cord compression and signal change in the cord at C3-4, spondylosis at C4-5 and C5-6 with stenosis at C5-6. The patient tried conservative measures without relief. Pain was debilitating. Recommended ACDF with plating. Patient understood the risks, benefits, and alternatives and potential outcomes and wished to proceed.  PROCEDURE DETAILS: Patient was brought to the operating room placed under general endotracheal anesthesia. Patient was placed in the supine position on the operating room table. The neck was prepped with Duraprep and draped in a sterile fashion.   Three cc of local anesthesia was injected and a transverse incision was made on the right side of the neck.  Dissection was carried down thru the subcutaneous tissue and the platysma was  elevated, opened, and undermined with Metzenbaum scissors.  Dissection was then carried out thru an avascular plane leaving the sternocleidomastoid carotid artery and jugular vein laterally and the trachea and esophagus medially with the assistance of my nurse practitioner. The ventral  aspect of the vertebral column was identified and a localizing x-ray was taken. The C4-5 level was identified and all in the room agreed with the level. The longus colli muscles were then elevated and the retractor was placed with the assistance of my nurse practitioner to expose C3-4 C4-5 and C5-6. The annulus was incised and the disc space entered. Discectomy was performed with micro-curettes and pituitary rongeurs. I then used the high-speed drill to drill the endplates down to the level of the posterior longitudinal ligament. The drill shavings were saved in a mucous trap for later arthrodesis. The operating microscope was draped and brought into the field provided additional magnification, illumination and visualization. Discectomy was continued posteriorly thru the disc space. Posterior longitudinal ligament was opened with a nerve hook, and then removed along with disc herniation and osteophytes, decompressing the spinal canal and thecal sac. We then continued to remove osteophytic overgrowth and disc material decompressing the neural foramina and exiting nerve roots bilaterally. The scope was angled up and down to help decompress and undercut the vertebral bodies. Once the decompression was completed we could pass a nerve hook circumferentially to assure adequate decompression in the midline and in the neural foramina. So by both visualization and palpation we felt we had an adequate decompression of the neural elements. We then measured the height of the intravertebral disc space and selected a 7 millimeter PTI interbody cage packed with autograft and DBM for C3-4 and C5-6 and a 6 mm graft at C4-5. It was then gently positioned in the intravertebral disc space(s) and countersunk. I then used a 56 mm Alphatec plate and placed 14 mm variable angle screws into the vertebral bodies of each level and locked them into position.  The wound was irrigated with bacitracin solution, checked for hemostasis which was  established and confirmed. Once meticulous hemostasis was achieved, we then proceeded with closure with the assistance of my nurse practitioner. The platysma was closed with interrupted 3-0 undyed Vicryl suture, the subcuticular layer was closed with interrupted 3-0 undyed Vicryl suture. The skin edges were approximated with steristrips. The drapes were removed. A sterile dressing was applied. The patient was then awakened from general anesthesia and transferred to the recovery room in stable condition. At the end of the procedure all sponge, needle and instrument counts were correct.   PLAN OF CARE: Admit to inpatient   PATIENT DISPOSITION:  PACU - hemodynamically stable.   Delay start of Pharmacological VTE agent (>24hrs) due to surgical blood loss or risk of bleeding:  yes

## 2022-01-03 MED ORDER — OXYCODONE-ACETAMINOPHEN 10-325 MG PO TABS: 1.0000 | ORAL_TABLET | ORAL | 0 refills | Status: DC | PRN

## 2022-01-03 NOTE — Progress Notes (Signed)
Patient is discharged from room 3C10 at this time. Alert and in stable condition. IV site d/c'd and instructions read to patient with understanding verbalized and all questions answered. Left unit via wheelchair with all belongings at side.

## 2022-01-03 NOTE — Discharge Instructions (Signed)
Wound Care Keep incision area dry.  You may remove outer bandage after 2 days and shower.   If you shower prior cover incision with plastic wrap.  Do not put any creams, lotions, or ointments on incision. Leave steri-strips on neck.  They will fall off by themselves. Activity Walk each and every day, increasing distance each day. No lifting greater than 5 lbs.  Avoid excessive neck motion. No driving for 2 weeks; may ride as a passenger locally. Diet Resume your normal diet.  Return to Work Will be discussed at you follow up appointment. Call Your Doctor If Any of These Occur Redness, drainage, or swelling at the wound.  Temperature greater than 101 degrees. Severe pain not relieved by pain medication. Increased difficulty swallowing.  Incision starts to come apart. Follow Up Appt Call today and ask for Wells Guiles for appointment in 1-2 weeks ([phone]) or for problems.  If you have any hardware placed in your spine, you will need an x-ray before your appointment.

## 2022-01-03 NOTE — Discharge Summary (Signed)
Physician Discharge Summary  Patient ID: Anne [name] MRN: [id number] DOB/AGE: 11-18-1955 66 y.o.  Admit date: 01/02/2022 Discharge date: 01/03/2022  Admission Diagnoses: Cervical spondylosis with cervical spinal stenosis C3-4 C4-5 C5-6 with cervical spondylitic myelopathy  Discharge Diagnoses: Cervical spondylosis with cervical spinal stenosis C3-4 C4-5 C5-6 with cervical spondylitic myelopathy Principal Problem:   S/P cervical spinal fusion   Discharged Condition: good  Hospital Course: The patient was admitted on 01/02/2022 and taken to the operating room where the patient underwent C3-C6 ACDF. The patient tolerated the procedure well and was taken to the recovery room and then to the floor in stable condition. The hospital course was routine. There were no complications. The wound remained clean dry and intact. Pt had appropriate neck / upper back soreness. No complaints of arm pain or new N/T/W. The patient remained afebrile with stable vital signs, and tolerated a regular diet. The patient continued to increase activities, and pain was well controlled with oral pain medications.   Consults: None  Significant Diagnostic Studies: radiology: X-Ray: intraoperative   Treatments: surgery: Decompressive anterior cervical discectomy C3-4 C4-5 C5-6, 2. Anterior cervical arthrodesis C3-4 C4-5 C5-6 utilizing a PTI interbody cage packed with locally harvested morcellized autologous bone graft and DBM putty, 3. Anterior cervical plating C3-C6 inclusive utilizing a Alphatec plate  Discharge Exam: Blood pressure (!) 136/97, pulse 80, temperature 98.5 F (36.9 C), temperature source Oral, resp. rate 18, height 5' 9"  (1.753 m), weight 70.3 kg, last menstrual period 02/18/2001, SpO2 97 %. Physical Exam: Patient is awake, A/O X 4, conversant, and in good spirits. Eyes open spontaneously. They are in NAD and VSS. Doing well. Speech is fluent and appropriate. MAEW with good strength that is symmetric  bilaterally. Sensation to light touch is intact. PERLA, EOMI. CNs grossly intact. Dressing is clean dry intact. Incision is well approximated with no drainage, erythema, or edema.   Disposition: Discharge disposition: 01-Home or Self Care        Allergies as of 01/03/2022   No Known Allergies      Medication List     TAKE these medications    acetaminophen-codeine 300-60 MG tablet Commonly known as: TYLENOL #4 Take 2 tablets by mouth 2 (two) times daily as needed.   aspirin EC 81 MG tablet Take 1 tablet (81 mg total) by mouth daily. Swallow whole.   cholestyramine 4 g packet Commonly known as: QUESTRAN Take 4 g by mouth daily.   cyclobenzaprine 10 MG tablet Commonly known as: FLEXERIL Take 10 mg by mouth at bedtime.   ezetimibe 10 MG tablet Commonly known as: ZETIA Take 10 mg by mouth at bedtime.   gabapentin 400 MG capsule Commonly known as: NEURONTIN Take 400 mg by mouth daily.   INFLIXIMAB IV Inject 1 Dose into the vein every 8 (eight) weeks.   midodrine 5 MG tablet Commonly known as: PROAMATINE Take 1 tablet (5 mg total) by mouth 3 (three) times daily with meals. What changed:  when to take this reasons to take this   nitroGLYCERIN 0.4 MG SL tablet Commonly known as: NITROSTAT Place 1 tablet (0.4 mg total) under the tongue every 5 (five) minutes x 3 doses as needed for chest pain.   OxyCODONE HCl (Abuse Deter) 5 MG Taba Commonly known as: OXAYDO Take 5 mg by mouth 2 (two) times daily as needed (pain).   oxyCODONE-acetaminophen 10-325 MG tablet Commonly known as: Percocet Take 1 tablet by mouth every 4 (four) hours as needed for pain.  rosuvastatin 40 MG tablet Commonly known as: CRESTOR Take 1 tablet (40 mg total) by mouth daily.   sodium bicarbonate 650 MG tablet Take 3 tablets (1,950 mg total) by mouth 2 (two) times daily.         Signed: Marvis Moeller 01/03/2022, 10:24 AM

## 2022-01-03 NOTE — Progress Notes (Signed)
PT Cancellation Note  Patient Details Name: KELLAN BOEHLKE MRN: [id number] DOB: [birth year]   Cancelled Treatment:    Reason Eval/Treat Not Completed: PT screened, no needs identified, will sign off Per OT, no skilled PT needs. Will sign off. If needs change, please re-consult.   Lou Miner, DPT  Acute Rehabilitation Services  Office: [phone]  Rudean Hitt 01/03/2022, 9:56 AM

## 2022-01-03 NOTE — Evaluation (Signed)
Occupational Therapy Evaluation Patient Details Name: Anne [name] MRN: [id number] DOB: 10/04/1955 Today's Date: 01/03/2022   History of Present Illness 66 yo F s/p ACDF.  PMH includes: CAD, STEMI, Crohn's, R hip dysfunction.   Clinical Impression   Patient admitted for the procedure above.  PTA she lives with her son, who assist with community mobility, but otherwise, the patient is independent with ADL, iADL and mobility.  Primary deficit is chronic low back pain and generalized soreness to her neck.  Patient issued the precaution sheet with goo understanding.  All questions answered, and no further needs in the acute setting.  Recommend follow up with MD as prescribed.        Recommendations for follow up therapy are one component of a multi-disciplinary discharge planning process, led by the attending physician.  Recommendations may be updated based on patient status, additional functional criteria and insurance authorization.   Follow Up Recommendations  No OT follow up    Assistance Recommended at Discharge PRN  Patient can return home with the following      Functional Status Assessment  Patient has not had a recent decline in their functional status  Equipment Recommendations  None recommended by OT    Recommendations for Other Services       Precautions / Restrictions Precautions Precautions: Cervical Precaution Booklet Issued: Yes (comment) Restrictions Weight Bearing Restrictions: No      Mobility Bed Mobility Overal bed mobility: Modified Independent                  Transfers Overall transfer level: Modified independent                        Balance Overall balance assessment: Mild deficits observed, not formally tested                                         ADL either performed or assessed with clinical judgement   ADL Overall ADL's : At baseline                                              Vision Baseline Vision/History: 1 Wears glasses Patient Visual Report: No change from baseline       Perception Perception Perception: Not tested   Praxis Praxis Praxis: Not tested    Pertinent Vitals/Pain Pain Assessment Pain Assessment: Faces Faces Pain Scale: Hurts little more Pain Location: low baci Pain Descriptors / Indicators: Tender Pain Intervention(s): Monitored during session     Hand Dominance Right   Extremity/Trunk Assessment Upper Extremity Assessment Upper Extremity Assessment: Overall WFL for tasks assessed   Lower Extremity Assessment Lower Extremity Assessment: Overall WFL for tasks assessed   Cervical / Trunk Assessment Cervical / Trunk Assessment: Kyphotic;Neck Surgery   Communication Communication Communication: No difficulties   Cognition Arousal/Alertness: Awake/alert Behavior During Therapy: WFL for tasks assessed/performed Overall Cognitive Status: Within Functional Limits for tasks assessed                                       General Comments   VSS on RA    Exercises     Shoulder Instructions  Home Living Family/patient expects to be discharged to:: Private residence Living Arrangements: Children Available Help at Discharge: Family;Available PRN/intermittently Type of Home: Apartment Home Access: Stairs to enter Entrance Stairs-Number of Steps: 3 flights Entrance Stairs-Rails: Can reach both Home Layout: One level     Bathroom Shower/Tub: Teacher, early years/pre: Standard Bathroom Accessibility: Yes How Accessible: Accessible via walker Home Equipment: None          Prior Functioning/Environment Prior Level of Function : Independent/Modified Independent                        OT Problem List: Impaired balance (sitting and/or standing)      OT Treatment/Interventions:      OT Goals(Current goals can be found in the care plan section) Acute Rehab OT Goals Patient  Stated Goal: Return home OT Goal Formulation: With patient Time For Goal Achievement: 01/05/22 Potential to Achieve Goals: Good  OT Frequency:      Co-evaluation              AM-PAC OT "6 Clicks" Daily Activity     Outcome Measure Help from another person eating meals?: None Help from another person taking care of personal grooming?: None Help from another person toileting, which includes using toliet, bedpan, or urinal?: None Help from another person bathing (including washing, rinsing, drying)?: None Help from another person to put on and taking off regular upper body clothing?: None Help from another person to put on and taking off regular lower body clothing?: None 6 Click Score: 24   End of Session Nurse Communication: Mobility status  Activity Tolerance: Patient tolerated treatment well Patient left: in chair;with call bell/phone within reach  OT Visit Diagnosis: Unsteadiness on feet (R26.81)                Time: 0034-9179 OT Time Calculation (min): 15 min Charges:  OT General Charges $OT Visit: 1 Visit OT Evaluation $OT Eval Moderate Complexity: 1 Mod  01/03/2022  RP, OTR/L  Acute Rehabilitation Services  Office:  [phone]   Metta Clines 01/03/2022, 9:42 AM

## 2022-01-05 MED FILL — Thrombin For Soln 5000 Unit: CUTANEOUS | Qty: 2 | Status: AC

## 2022-01-07 ENCOUNTER — Encounter (HOSPITAL_COMMUNITY): Payer: Self-pay | Admitting: Neurological Surgery

## 2022-01-28 ENCOUNTER — Encounter: Payer: Self-pay | Admitting: *Deleted

## 2022-01-28 ENCOUNTER — Other Ambulatory Visit: Payer: Self-pay | Admitting: *Deleted

## 2022-01-28 DIAGNOSIS — E782 Mixed hyperlipidemia: Secondary | ICD-10-CM

## 2022-02-05 DIAGNOSIS — K5090 Crohn's disease, unspecified, without complications: Secondary | ICD-10-CM | POA: Diagnosis not present

## 2022-02-11 DIAGNOSIS — I2510 Atherosclerotic heart disease of native coronary artery without angina pectoris: Secondary | ICD-10-CM | POA: Diagnosis not present

## 2022-02-12 ENCOUNTER — Encounter: Payer: Self-pay | Admitting: *Deleted

## 2022-02-12 LAB — HEPATIC FUNCTION PANEL
AG Ratio: 1.2 (calc) (ref 1.0–2.5)
ALT: 31 U/L — ABNORMAL HIGH (ref 6–29)
AST: 40 U/L — ABNORMAL HIGH (ref 10–35)
Albumin: 3.9 g/dL (ref 3.6–5.1)
Alkaline phosphatase (APISO): 117 U/L (ref 37–153)
Bilirubin, Direct: 0.1 mg/dL (ref 0.0–0.2)
Globulin: 3.2 g/dL (calc) (ref 1.9–3.7)
Indirect Bilirubin: 0.3 mg/dL (calc) (ref 0.2–1.2)
Total Bilirubin: 0.4 mg/dL (ref 0.2–1.2)
Total Protein: 7.1 g/dL (ref 6.1–8.1)

## 2022-02-12 LAB — LIPID PANEL
Cholesterol: 111 mg/dL (ref <200)
HDL: 53 mg/dL (ref >=50)
LDL Cholesterol (Calc): 42 mg/dL (calc)
Non-HDL Cholesterol (Calc): 58 mg/dL (calc) (ref <130)
Total CHOL/HDL Ratio: 2.1 (calc) (ref <5.0)
Triglycerides: 82 mg/dL (ref <150)

## 2022-02-13 DIAGNOSIS — R197 Diarrhea, unspecified: Secondary | ICD-10-CM | POA: Diagnosis not present

## 2022-02-13 DIAGNOSIS — F17210 Nicotine dependence, cigarettes, uncomplicated: Secondary | ICD-10-CM | POA: Diagnosis not present

## 2022-02-13 DIAGNOSIS — N183 Chronic kidney disease, stage 3 unspecified: Secondary | ICD-10-CM | POA: Diagnosis not present

## 2022-02-13 DIAGNOSIS — Z0001 Encounter for general adult medical examination with abnormal findings: Secondary | ICD-10-CM | POA: Diagnosis not present

## 2022-02-13 DIAGNOSIS — L723 Sebaceous cyst: Secondary | ICD-10-CM | POA: Diagnosis not present

## 2022-02-13 DIAGNOSIS — K5090 Crohn's disease, unspecified, without complications: Secondary | ICD-10-CM | POA: Diagnosis not present

## 2022-02-13 DIAGNOSIS — R0602 Shortness of breath: Secondary | ICD-10-CM | POA: Diagnosis not present

## 2022-02-13 DIAGNOSIS — M5416 Radiculopathy, lumbar region: Secondary | ICD-10-CM | POA: Diagnosis not present

## 2022-02-13 DIAGNOSIS — I10 Essential (primary) hypertension: Secondary | ICD-10-CM | POA: Diagnosis not present

## 2022-02-13 DIAGNOSIS — Z9861 Coronary angioplasty status: Secondary | ICD-10-CM | POA: Diagnosis not present

## 2022-02-13 DIAGNOSIS — Z1331 Encounter for screening for depression: Secondary | ICD-10-CM | POA: Diagnosis not present

## 2022-02-13 DIAGNOSIS — I2510 Atherosclerotic heart disease of native coronary artery without angina pectoris: Secondary | ICD-10-CM | POA: Diagnosis not present

## 2022-02-19 ENCOUNTER — Other Ambulatory Visit: Payer: Self-pay | Admitting: Neurological Surgery

## 2022-02-19 DIAGNOSIS — M5136 Other intervertebral disc degeneration, lumbar region: Secondary | ICD-10-CM | POA: Diagnosis not present

## 2022-02-19 DIAGNOSIS — M5412 Radiculopathy, cervical region: Secondary | ICD-10-CM | POA: Diagnosis not present

## 2022-02-27 ENCOUNTER — Ambulatory Visit (INDEPENDENT_AMBULATORY_CARE_PROVIDER_SITE_OTHER): Payer: Medicare Other

## 2022-02-27 DIAGNOSIS — M4807 Spinal stenosis, lumbosacral region: Secondary | ICD-10-CM | POA: Diagnosis not present

## 2022-02-27 DIAGNOSIS — M5416 Radiculopathy, lumbar region: Secondary | ICD-10-CM | POA: Diagnosis not present

## 2022-02-27 DIAGNOSIS — M545 Low back pain, unspecified: Secondary | ICD-10-CM | POA: Diagnosis not present

## 2022-02-27 DIAGNOSIS — M5136 Other intervertebral disc degeneration, lumbar region: Secondary | ICD-10-CM

## 2022-02-27 DIAGNOSIS — M47816 Spondylosis without myelopathy or radiculopathy, lumbar region: Secondary | ICD-10-CM | POA: Diagnosis not present

## 2022-02-27 DIAGNOSIS — M48061 Spinal stenosis, lumbar region without neurogenic claudication: Secondary | ICD-10-CM | POA: Diagnosis not present

## 2022-03-04 ENCOUNTER — Other Ambulatory Visit: Payer: Self-pay | Admitting: Gastroenterology

## 2022-03-11 ENCOUNTER — Other Ambulatory Visit: Payer: Self-pay | Admitting: Neurological Surgery

## 2022-03-12 ENCOUNTER — Telehealth: Payer: Self-pay

## 2022-03-12 ENCOUNTER — Telehealth: Payer: Self-pay | Admitting: *Deleted

## 2022-03-12 NOTE — Telephone Encounter (Signed)
Set up telephone clearance. Med rec and consent done.

## 2022-03-12 NOTE — Telephone Encounter (Signed)
   Name: Anne [name]  DOB: Jul 27, 1955  MRN: [id number]  Primary Cardiologist: None   Preoperative team, please contact this patient and set up a phone call appointment for further preoperative risk assessment. Please obtain consent and complete medication review. Thank you for your help.  I confirm that guidance regarding antiplatelet and oral anticoagulation therapy has been completed and, if necessary, noted below.  Her aspirin may be held for 5-7 days prior to her procedure.  Please resume as soon as hemostasis is achieved.   Deberah Pelton, NP 03/12/2022, 1:54 PM Anne [name]

## 2022-03-12 NOTE — Telephone Encounter (Signed)
  Patient Consent for Virtual Visit         Anne [name] has provided verbal consent on 03/12/2022 for a virtual visit (video or telephone).   CONSENT FOR VIRTUAL VISIT FOR:  Anne [name]  By participating in this virtual visit I agree to the following:  I hereby voluntarily request, consent and authorize [facility] and its employed or contracted physicians, physician assistants, nurse practitioners or other licensed health care professionals (the Practitioner), to provide me with telemedicine health care services (the "Services") as deemed necessary by the treating Practitioner. I acknowledge and consent to receive the Services by the Practitioner via telemedicine. I understand that the telemedicine visit will involve communicating with the Practitioner through live audiovisual communication technology and the disclosure of certain medical information by electronic transmission. I acknowledge that I have been given the opportunity to request an in-person assessment or other available alternative prior to the telemedicine visit and am voluntarily participating in the telemedicine visit.  I understand that I have the right to withhold or withdraw my consent to the use of telemedicine in the course of my care at any time, without affecting my right to future care or treatment, and that the Practitioner or I may terminate the telemedicine visit at any time. I understand that I have the right to inspect all information obtained and/or recorded in the course of the telemedicine visit and may receive copies of available information for a reasonable fee.  I understand that some of the potential risks of receiving the Services via telemedicine include:  Delay or interruption in medical evaluation due to technological equipment failure or disruption; Information transmitted may not be sufficient (e.g. poor resolution of images) to allow for appropriate medical decision making by the  Practitioner; and/or  In rare instances, security protocols could fail, causing a breach of personal health information.  Furthermore, I acknowledge that it is my responsibility to provide information about my medical history, conditions and care that is complete and accurate to the best of my ability. I acknowledge that Practitioner's advice, recommendations, and/or decision may be based on factors not within their control, such as incomplete or inaccurate data provided by me or distortions of diagnostic images or specimens that may result from electronic transmissions. I understand that the practice of medicine is not an exact science and that Practitioner makes no warranties or guarantees regarding treatment outcomes. I acknowledge that a copy of this consent can be made available to me via my patient portal ([facility]), or I can request a printed copy by calling the office of [facility].    I understand that my insurance will be billed for this visit.   I have read or had this consent read to me. I understand the contents of this consent, which adequately explains the benefits and risks of the Services being provided via telemedicine.  I have been provided ample opportunity to ask questions regarding this consent and the Services and have had my questions answered to my satisfaction. I give my informed consent for the services to be provided through the use of telemedicine in my medical care

## 2022-03-12 NOTE — Telephone Encounter (Signed)
..     Pre-operative Risk Assessment    Patient Name: Anne [name]  DOB: 10/24/1955 MRN: [id number]      Request for Surgical Clearance    Procedure:   Laminectomy and Foraminotomy - L4-L5 - L5-S1 - left          Date of Surgery:  Clearance 06/05/22                                 Surgeon:  dr Sherley Bounds Surgeon's Group or Practice Name:  [facility] Phone number:  [phone] Fax number:  [phone]    Type of Clearance Requested:   - Medical  - Pharmacy:  Hold Aspirin     Type of Anesthesia:  General    Additional requests/questions:    SignedMerry Lofty   03/12/2022, 1:21 PM

## 2022-03-18 ENCOUNTER — Ambulatory Visit: Payer: Medicare Other | Attending: Cardiology | Admitting: Nurse Practitioner

## 2022-03-18 DIAGNOSIS — Z01810 Encounter for preprocedural cardiovascular examination: Secondary | ICD-10-CM

## 2022-03-18 NOTE — Progress Notes (Signed)
Virtual Visit via Telephone Note   Because of Anne [name]'s co-morbid illnesses, she is at least at moderate risk for complications without adequate follow up.  This format is felt to be most appropriate for this patient at this time.  The patient did not have access to video technology/had technical difficulties with video requiring transitioning to audio format only (telephone).  All issues noted in this document were discussed and addressed.  No physical exam could be performed with this format.  Please refer to the patient's chart for her consent to telehealth for [facility].  Evaluation Performed:  Preoperative cardiovascular risk assessment _____________   Date:  03/18/2022   Patient ID:  Anne [name], DOB 04-Jan-1956, MRN [id number] Patient Location:  Home Provider location:   Office  Primary Care Provider:  Jodi Marble, MD Primary Cardiologist:  None  Chief Complaint / Patient Profile   66 y.o. y/o female with a h/o CAD, hypertension, hyperlipidemia, tobacco use, and Crohn's colitis who is pending L4-5, L5-S1 Laminectomy and Foraminotomy on 06/06/2022 with Dr. Sherley Bounds of [facility] and presents today for telephonic preoperative cardiovascular risk assessment.  History of Present Illness    Anne [name] is a 66 y.o. female who presents via audio/video conferencing for a telehealth visit today.  Pt was last seen in cardiology clinic on 12/31/2021 by Dr. Stanford Breed.  At that time CALEYAH JR was doing well. Lexiscan Myoview in the setting of preoperative cardiac evaluation was low risk, no evidence of ischemia.  The patient is now pending procedure as outlined above. Since her last visit, she has been stable from a cardiac standpoint.   She denies chest pain, palpitations, dyspnea, pnd, orthopnea, n, v, dizziness, syncope, edema, weight gain, or early satiety. All other systems reviewed and are otherwise negative except as noted above.   Past  Medical History    Past Medical History:  Diagnosis Date   Coronary artery disease    Crohn's colitis ([place])    GERD (gastroesophageal reflux disease)    Hyperlipidemia    Hypokalemia 08/04/2019   STEMI (ST elevation myocardial infarction) ([place])    10/27/17 PCI/DES x1 to the dRCA, with residual thrombus in the PLA, normal EF   Past Surgical History:  Procedure Laterality Date   ANTERIOR CERVICAL DECOMP/DISCECTOMY FUSION N/A 01/02/2022   Procedure: Anterior Cervical Decompression Fusion - Cervical three-Cervical four - Cervical four-Cervical five - Cervical five-Cervical six;  Surgeon: Eustace Zappulla, MD;  Location: [facility];  Service: Neurosurgery;  Laterality: N/A;   BIOPSY  09/22/2019   Procedure: BIOPSY;  Surgeon: Carol Ada, MD;  Location: [facility];  Service: Endoscopy;;   CHOLECYSTECTOMY  2005   COLONOSCOPY WITH PROPOFOL N/A 09/22/2019   Procedure: COLONOSCOPY WITH PROPOFOL;  Surgeon: Carol Ada, MD;  Location: [facility];  Service: Endoscopy;  Laterality: N/A;   CORONARY/GRAFT ACUTE MI REVASCULARIZATION N/A 10/26/2017   Procedure: Coronary/Graft Acute MI Revascularization;  Surgeon: Troy Sine, MD;  Location: [place] [facility];  Service: Cardiovascular;  Laterality: N/A;   FLEXIBLE SIGMOIDOSCOPY N/A 12/12/2021   Procedure: FLEXIBLE SIGMOIDOSCOPY;  Surgeon: Carol Ada, MD;  Location: [facility];  Service: Gastroenterology;  Laterality: N/A;   LEFT HEART CATH AND CORONARY ANGIOGRAPHY N/A 10/26/2017   Procedure: LEFT HEART CATH AND CORONARY ANGIOGRAPHY;  Surgeon: Troy Sine, MD;  Location: [place] [facility];  Service: Cardiovascular;  Laterality: N/A;   LEFT HEART CATH AND CORONARY ANGIOGRAPHY Left 05/05/2018   Procedure:  Left heart cath and coronary angiography;  Surgeon: Dionisio David, MD;  Location: [place] [facility];  Service: Cardiovascular;  Laterality: Left;   MUSCLE BIOPSY Left 08/10/2019   Procedure: THIGH MUSCLE BIOPSY;  Surgeon: Coralie Keens, MD;  Location: [place] [facility];  Service: General;  Laterality: Left;   SMALL INTESTINE SURGERY     TONSILLECTOMY     removed as a child    Allergies  No Known Allergies  Home Medications    Prior to Admission medications   Medication Sig Start Date End Date Taking? Authorizing Provider  aspirin EC 81 MG tablet Take 1 tablet (81 mg total) by mouth daily. Swallow whole. 12/31/21   Lelon Perla, MD  cholestyramine Lucrezia Starch) 4 g packet Take 4 g by mouth daily.    [provider]  cyclobenzaprine (FLEXERIL) 10 MG tablet Take 10 mg by mouth at bedtime. 08/09/19   [provider]  ezetimibe (ZETIA) 10 MG tablet Take 10 mg by mouth at bedtime. 09/05/21   [provider]  gabapentin (NEURONTIN) 400 MG capsule Take 400 mg by mouth daily.    [provider]  INFLIXIMAB IV Inject 1 Dose into the vein every 8 (eight) weeks.    [provider]  midodrine (PROAMATINE) 5 MG tablet Take 1 tablet (5 mg total) by mouth 3 (three) times daily with meals. Patient taking differently: Take 5 mg by mouth daily as needed (Take if blood pressure is low). 02/06/20   Geradine Girt, DO  nitroGLYCERIN (NITROSTAT) 0.4 MG SL tablet Place 1 tablet (0.4 mg total) under the tongue every 5 (five) minutes x 3 doses as needed for chest pain. 10/28/17   Cheryln Manly, NP  OxyCODONE HCl, Abuse Deter, (OXAYDO) 5 MG TABA Take 5 mg by mouth 2 (two) times daily as needed (pain).    [provider]  oxyCODONE-acetaminophen (PERCOCET) 10-325 MG tablet Take 1 tablet by mouth every 4 (four) hours as needed for pain. 01/03/22 01/03/23  Marvis Moeller, NP  rosuvastatin (CRESTOR) 40 MG tablet Take 1 tablet (40 mg total) by mouth daily. 02/20/20   Geradine Girt, DO  sodium bicarbonate 650 MG tablet Take 3 tablets (1,950 mg total) by mouth 2 (two) times daily. 08/12/19   Asencion Noble, MD    Physical Exam    Vital Signs:  [name] does not have vital signs  available for review today.  Given telephonic nature of communication, physical exam is limited. AAOx3. NAD. Normal affect.  Speech and respirations are unlabored.  Accessory Clinical Findings    None  Assessment & Plan    1.  Preoperative Cardiovascular Risk Assessment:  According to the Revised Cardiac Risk Index (RCRI), her Perioperative Risk of Major Cardiac Event is (%): 0.4. Her Functional Capacity in METs is: 6.45 according to the Duke Activity Status Index (DASI). Therefore, based on ACC/AHA guidelines, patient would be at acceptable risk for the planned procedure without further cardiovascular testing.  The patient was advised that if she develops new symptoms prior to surgery to contact our office to arrange for a follow-up visit, and she verbalized understanding.  Her aspirin may be held for 5-7 days prior to her procedure.  Please resume as soon as hemostasis is achieved.   A copy of this note will be routed to requesting surgeon.  Time:   Today, I have spent 4 minutes with the patient with telehealth technology discussing medical history, symptoms, and management plan.  Lenna Sciara, NP  03/18/2022, 3:10 PM

## 2022-04-07 DIAGNOSIS — Z79899 Other long term (current) drug therapy: Secondary | ICD-10-CM | POA: Diagnosis not present

## 2022-04-07 DIAGNOSIS — R059 Cough, unspecified: Secondary | ICD-10-CM | POA: Diagnosis not present

## 2022-04-07 DIAGNOSIS — J101 Influenza due to other identified influenza virus with other respiratory manifestations: Secondary | ICD-10-CM | POA: Diagnosis not present

## 2022-04-07 DIAGNOSIS — U071 COVID-19: Secondary | ICD-10-CM | POA: Diagnosis not present

## 2022-04-21 ENCOUNTER — Telehealth: Payer: Self-pay | Admitting: Cardiology

## 2022-04-21 NOTE — Telephone Encounter (Signed)
Attempt to return call-left detailed message (ok per DPR)  Advised to call back with questions/concerns.

## 2022-04-21 NOTE — Telephone Encounter (Signed)
Pt returning call

## 2022-04-21 NOTE — Telephone Encounter (Signed)
Pt c/o medication issue:  1. Name of Medication:  Enalapril  2. How are you currently taking this medication (dosage and times per day)?   3. Are you having a reaction (difficulty breathing--STAT)?   4. What is your medication issue?   Patient would like to know if she should still be taking this medication. Please advise.

## 2022-04-21 NOTE — Telephone Encounter (Signed)
Left message to call back  Per office note 12/31/21 with Dr. Stanford Breed: I will discontinue enalapril (as well as metoprolol that she takes as needed). If her blood pressure increases we may be able to discontinue midodrine in the future.

## 2022-04-30 DIAGNOSIS — Z1231 Encounter for screening mammogram for malignant neoplasm of breast: Secondary | ICD-10-CM | POA: Diagnosis not present

## 2022-05-21 ENCOUNTER — Encounter (HOSPITAL_COMMUNITY): Payer: Self-pay | Admitting: Gastroenterology

## 2022-05-21 DIAGNOSIS — K5090 Crohn's disease, unspecified, without complications: Secondary | ICD-10-CM | POA: Diagnosis not present

## 2022-05-22 ENCOUNTER — Ambulatory Visit: Payer: Medicare Other | Admitting: Internal Medicine

## 2022-05-22 ENCOUNTER — Encounter: Payer: Self-pay | Admitting: Internal Medicine

## 2022-05-22 VITALS — BP 90/60 | HR 105 | Ht 69.0 in | Wt 155.0 lb

## 2022-05-22 DIAGNOSIS — E876 Hypokalemia: Secondary | ICD-10-CM

## 2022-05-22 DIAGNOSIS — I2510 Atherosclerotic heart disease of native coronary artery without angina pectoris: Secondary | ICD-10-CM | POA: Diagnosis not present

## 2022-05-22 DIAGNOSIS — I950 Idiopathic hypotension: Secondary | ICD-10-CM | POA: Diagnosis not present

## 2022-05-22 DIAGNOSIS — Z981 Arthrodesis status: Secondary | ICD-10-CM | POA: Diagnosis not present

## 2022-05-22 DIAGNOSIS — M48062 Spinal stenosis, lumbar region with neurogenic claudication: Secondary | ICD-10-CM | POA: Diagnosis not present

## 2022-05-22 MED ORDER — GABAPENTIN 400 MG PO CAPS: 400.0000 mg | ORAL_CAPSULE | Freq: Three times a day (TID) | ORAL | 0 refills | Status: DC

## 2022-05-22 MED ORDER — MIDODRINE HCL 5 MG PO TABS: 5.0000 mg | ORAL_TABLET | Freq: Every day | ORAL | 2 refills | Status: AC | PRN

## 2022-05-22 MED ORDER — ROSUVASTATIN CALCIUM 40 MG PO TABS: 40.0000 mg | ORAL_TABLET | Freq: Every day | ORAL | 0 refills | Status: DC

## 2022-05-22 MED ORDER — SODIUM BICARBONATE 650 MG PO TABS: 1950.0000 mg | ORAL_TABLET | Freq: Two times a day (BID) | ORAL | 2 refills | Status: DC

## 2022-05-22 MED ORDER — EZETIMIBE 10 MG PO TABS: 10.0000 mg | ORAL_TABLET | Freq: Every day | ORAL | 0 refills | Status: DC

## 2022-05-22 MED ORDER — ACETAMINOPHEN-CODEINE 300-60 MG PO TABS: 1.0000 | ORAL_TABLET | Freq: Four times a day (QID) | ORAL | 0 refills | Status: DC | PRN

## 2022-05-22 MED ORDER — CHOLESTYRAMINE 4 G PO PACK: 4.0000 g | PACK | Freq: Two times a day (BID) | ORAL | 0 refills | Status: DC

## 2022-05-22 NOTE — Progress Notes (Signed)
Established Patient Office Visit  Subjective:  Patient ID: Anne [name], female    DOB: 1955/10/17  Age: 67 y.o. MRN: [id number]  Chief Complaint  Patient presents with   Follow-up    3 month follow up    C/o low blood pressure with dizziness on standing and has reverted to using her previously prescribed Midodrine. Here for medication refills. Cough from post-covid has improved. Scheduled for lower spinal surgery next week at [facility].   Past Medical History:  Diagnosis Date   Coronary artery disease    Crohn'Anne [name] colitis (Anne [name])    GERD (gastroesophageal reflux disease)    Hyperlipidemia    Hypokalemia 08/04/2019   STEMI (ST elevation myocardial infarction) ([place])    10/27/17 PCI/DES x1 to the dRCA, with residual thrombus in the PLA, normal EF    Social History   Socioeconomic History   Marital status: Single    Spouse name: Not on file   Number of children: 1   Years of education: Not on file   Highest education level: Not on file  Occupational History   Not on file  Tobacco Use   Smoking status: Former    Packs/day: 0.25    Years: 45.00    Total pack years: 11.25    Types: Cigarettes    Quit date: 11/18/2021    Years since quitting: 0.5   Smokeless tobacco: Never  Vaping Use   Vaping Use: Never used  Substance and Sexual Activity   Alcohol use: No   Drug use: No   Sexual activity: Not on file  Other Topics Concern   Not on file  Social History Narrative   Not on file   Social Determinants of Health   Financial Resource Strain: Not on file  Food Insecurity: No Food Insecurity (01/03/2022)   Hunger Vital Sign    Worried About Running Out of Food in the Last Year: Never true    Ran Out of Food in the Last Year: Never true  Transportation Needs: No Transportation Needs (01/03/2022)   PRAPARE - Hydrologist (Medical): No    Lack of Transportation (Non-Medical): No  Physical Activity: Not on file  Stress: Not on file  Social  Connections: Not on file  Intimate Partner Violence: Not At Risk (01/03/2022)   Humiliation, Afraid, Rape, and Kick questionnaire    Fear of Current or Ex-Partner: No    Emotionally Abused: No    Physically Abused: No    Sexually Abused: No    Family History  Problem Relation Age of Onset   Stroke Mother    Crohn'Marquie Aderhold disease Neg Hx     No Known Allergies  Review of Systems  Constitutional: Negative.   HENT: Negative.    Eyes: Negative.   Respiratory: Negative.    Cardiovascular: Negative.   Gastrointestinal: Negative.   Genitourinary: Negative.   Musculoskeletal:        As in hpi  Skin: Negative.   Neurological:  Positive for dizziness.  Endo/Heme/Allergies: Negative.        Objective:   BP 90/60   Pulse (!) 105   Ht 5' 9"$  (1.753 m)   Wt 155 lb (70.3 kg)   LMP 02/18/2001   SpO2 96%   BMI 22.89 kg/m   Vitals:   05/22/22 1352  BP: 90/60  Pulse: (!) 105  Height: 5' 9"$  (1.753 m)  Weight: 155 lb (70.3 kg)  SpO2: 96%  BMI (Calculated): 22.88  Physical Exam Vitals reviewed.  Constitutional:      General: She is not in acute distress. HENT:     Head: Normocephalic.     Nose: Nose normal.     Mouth/Throat:     Mouth: Mucous membranes are moist.  Eyes:     Extraocular Movements: Extraocular movements intact.     Pupils: Pupils are equal, round, and reactive to light.  Cardiovascular:     Rate and Rhythm: Normal rate and regular rhythm.     Heart sounds: No murmur heard. Pulmonary:     Effort: Pulmonary effort is normal.     Breath sounds: No rhonchi or rales.  Abdominal:     General: Abdomen is flat.     Palpations: There is no hepatomegaly, splenomegaly or mass.  Musculoskeletal:        General: Normal range of motion.     Cervical back: Normal range of motion. No tenderness.  Skin:    General: Skin is warm and dry.  Neurological:     General: No focal deficit present.     Mental Status: She is alert and oriented to person, place, and time.      Cranial Nerves: No cranial nerve deficit.     Motor: No weakness.  Psychiatric:        Mood and Affect: Mood normal.        Behavior: Behavior normal.      No results found for any visits on 05/22/22.  No results found for this or any previous visit (from the past 2160 hour(Anne [name])).    Assessment & Plan:   Problem List Items Addressed This Visit   None  1. Coronary artery disease involving native coronary artery of native heart without angina pectoris - ezetimibe (ZETIA) 10 MG tablet; Take 1 tablet (10 mg total) by mouth at bedtime.  Dispense: 90 tablet; Refill: 0 - rosuvastatin (CRESTOR) 40 MG tablet; Take 1 tablet (40 mg total) by mouth daily.  Dispense: 90 tablet; Refill: 0  2. Hypokalemia  3. Anne [name]/P cervical spinal fusion  4. Spinal stenosis of lumbar region with neurogenic claudication - gabapentin (NEURONTIN) 400 MG capsule; Take 1 capsule (400 mg total) by mouth 3 (three) times daily.  Dispense: 270 capsule; Refill: 0 - acetaminophen-codeine (TYLENOL #4) 300-60 MG tablet; Take 1 tablet by mouth every 6 (six) hours as needed for moderate pain.  Dispense: 120 tablet; Refill: 0  5. Idiopathic hypotension - midodrine (PROAMATINE) 5 MG tablet; Take 1 tablet (5 mg total) by mouth daily as needed (Take if blood pressure is low).  Dispense: 30 tablet; Refill: 2     No follow-ups on file.   Total time spent: 30 minutes  Anne Napoleon, MD  05/22/2022

## 2022-05-25 IMAGING — CT CT ABD-PELV W/O CM
2 of 4 series · 16 of 46 positions shown, 18 images · non-contrast
Comparison: Abdominal MRI dated 08/07/2019.

CLINICAL DATA: 64-year-old female with abdominal pain.

EXAM:
CT ABDOMEN AND PELVIS WITHOUT CONTRAST
TECHNIQUE: Multidetector CT imaging of the abdomen and pelvis was performed
following the standard protocol without IV contrast.

[Series 3: ap without · axial · non-contrast · 0.71mm/px · z∈[+889,+1274]mm · 13 of 87 slices shown, 15 images]
[im 5/87  soft-tissue]
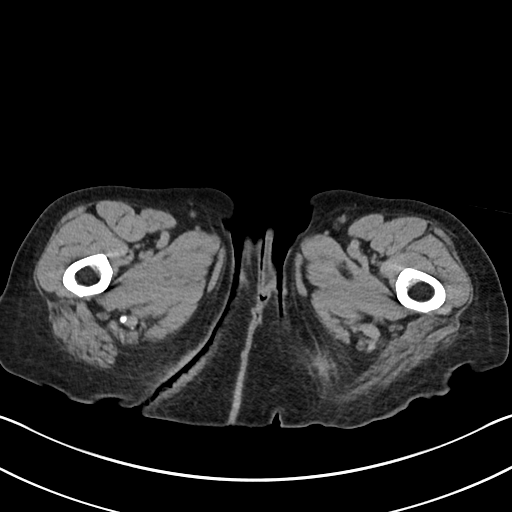
[im 5/87  bone]
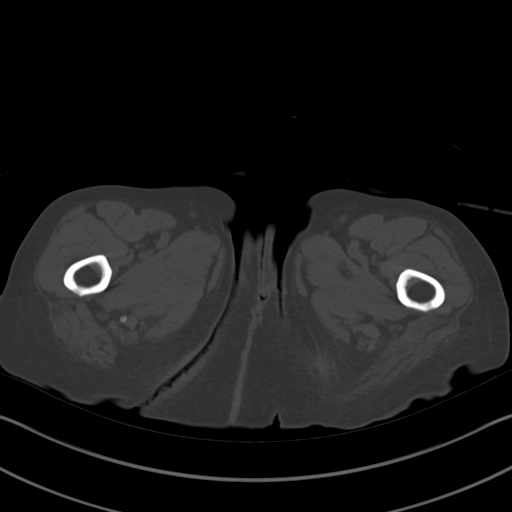
[im 14/87  soft-tissue]
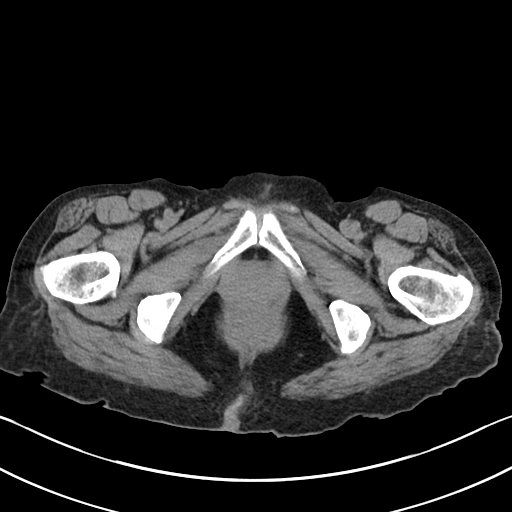
[im 19/87  soft-tissue]
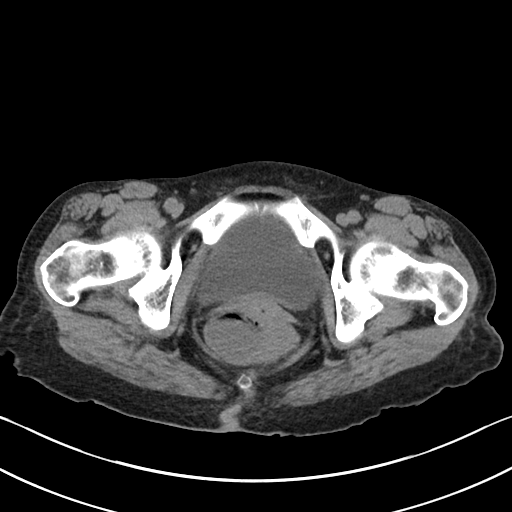
[im 23/87  soft-tissue]
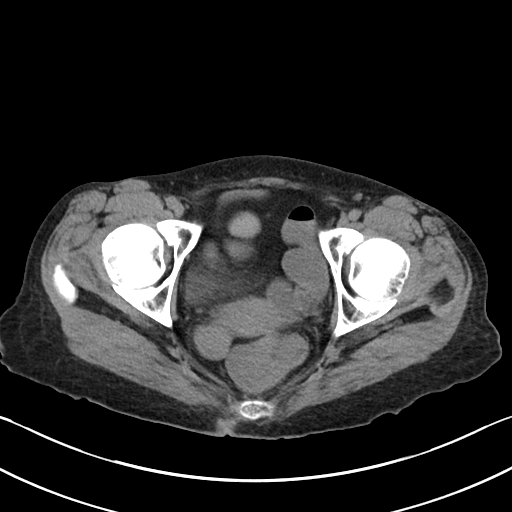
[im 32/87  soft-tissue]
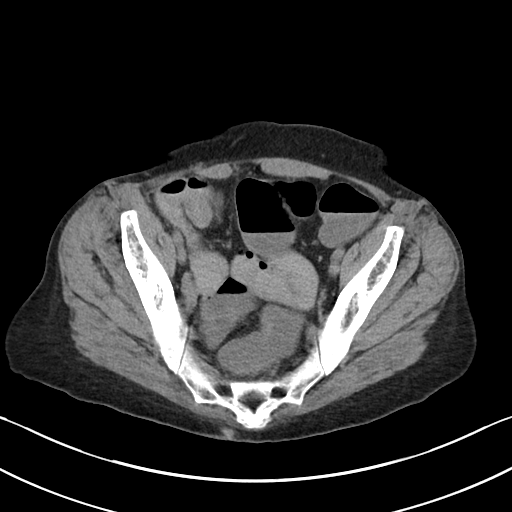
[im 37/87  soft-tissue]
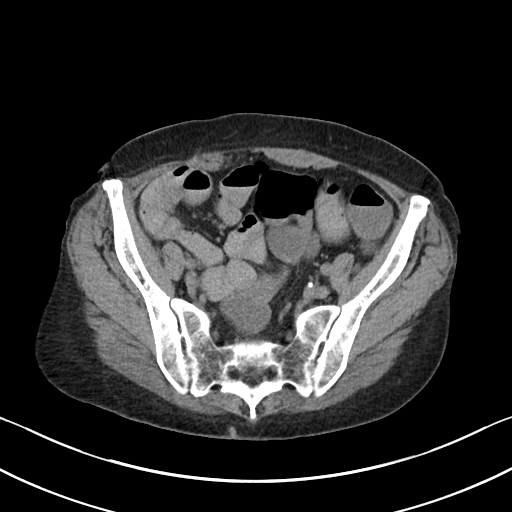
[im 46/87  soft-tissue]
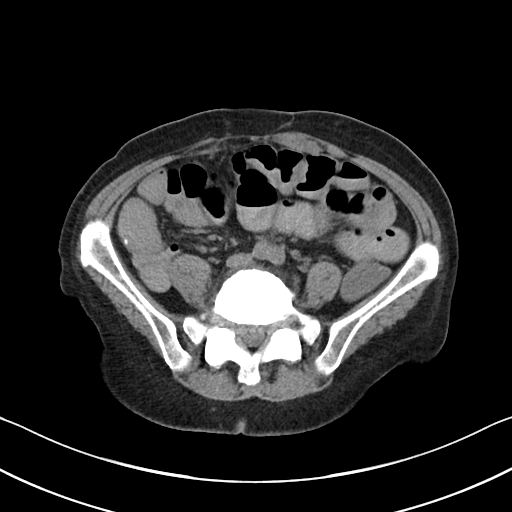
[im 50/87  soft-tissue]
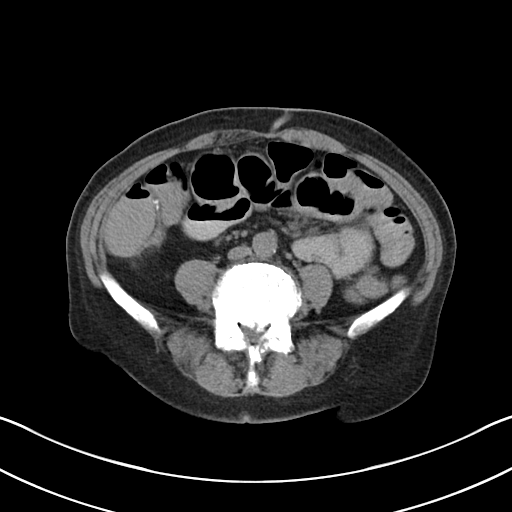
[im 55/87  soft-tissue]
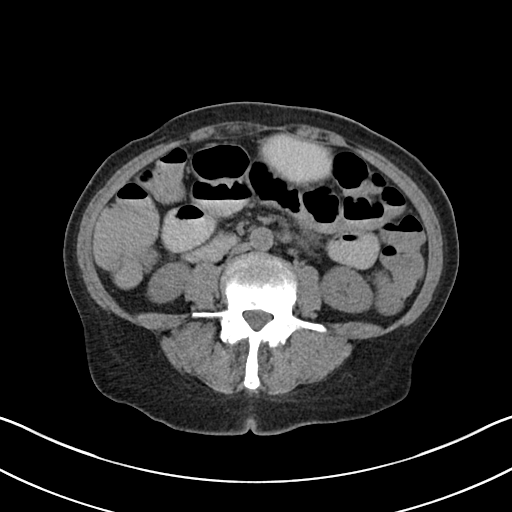
[im 55/87  bone]
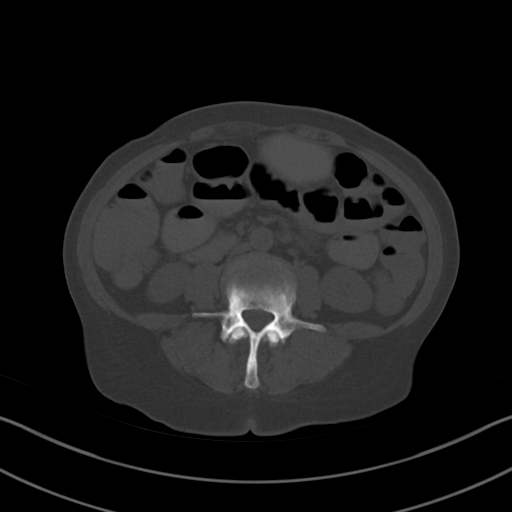
[im 64/87  soft-tissue]
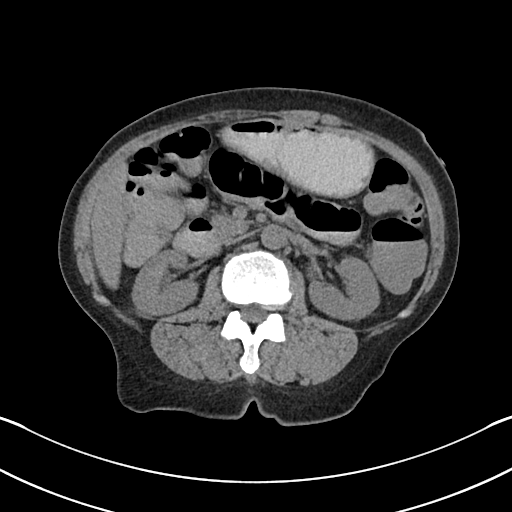
[im 68/87  soft-tissue]
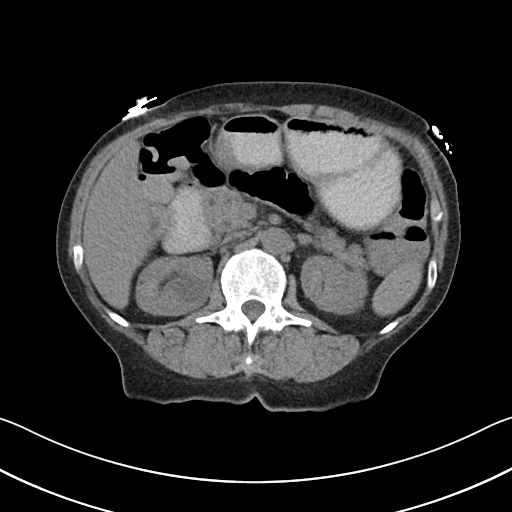
[im 73/87  soft-tissue]
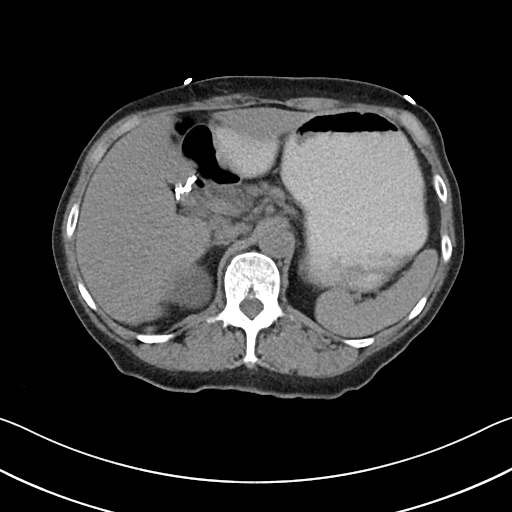
[im 82/87  soft-tissue]
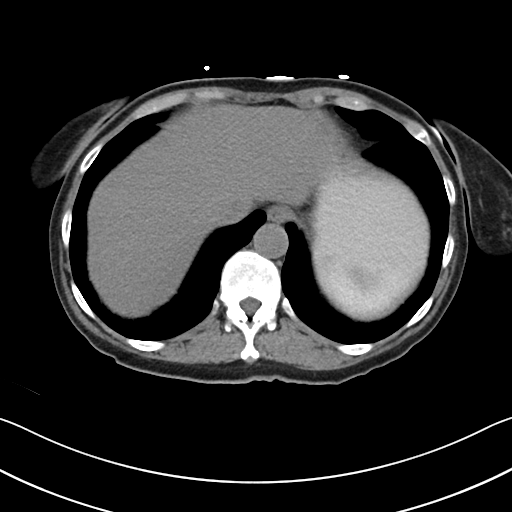

[Series 6: cor · coronal · 0.77mm/px · 3 of 94 slices shown]
[im 32/94  soft-tissue]
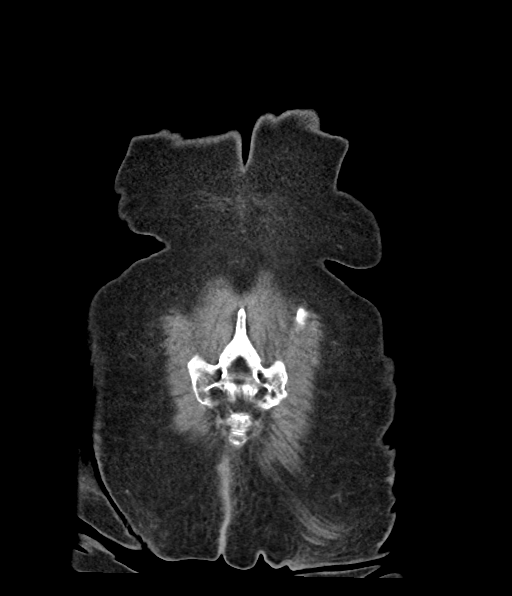
[im 42/94  soft-tissue]
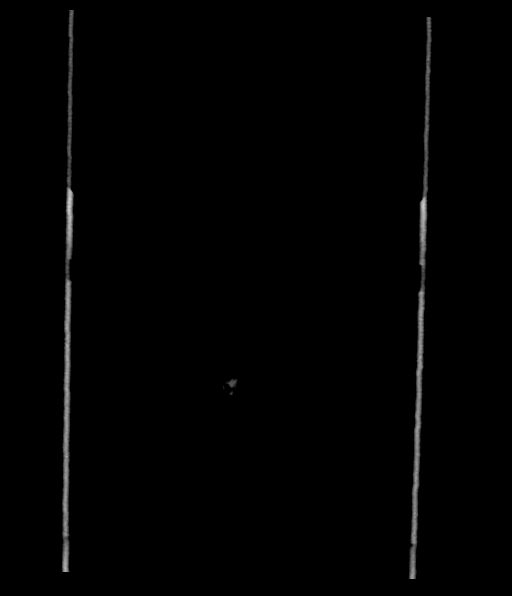
[im 52/94  soft-tissue]
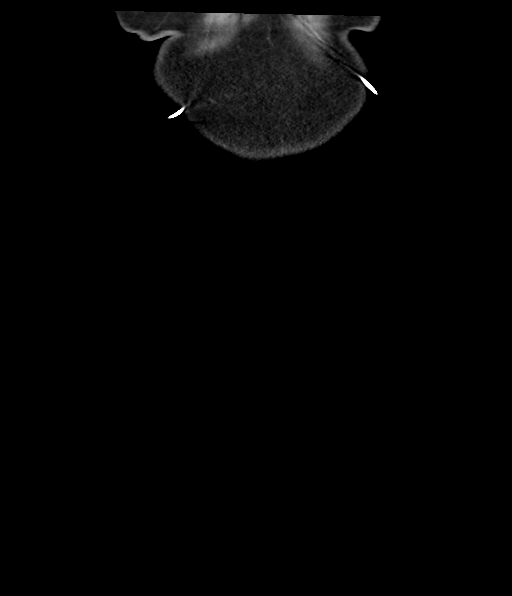

[16 of 46 positions shown; findings below may reference images not displayed]

FINDINGS: Evaluation of this exam is limited in the absence of intravenous
contrast.

Lower chest: The visualized lung bases are clear. There is coronary
vascular calcification.

No intra-abdominal free air or free fluid.

Hepatobiliary: Diffuse fatty liver. No intrahepatic biliary ductal
dilatation. Cholecystectomy. No retained calcified stone noted in
the central CBD.

Pancreas: Unremarkable. No pancreatic ductal dilatation or
surrounding inflammatory changes.

Spleen: Normal in size without focal abnormality.

Adrenals/Urinary Tract: The adrenal glands unremarkable. There is a
3 mm nonobstructing right renal inferior pole calculus. Additional
punctate stone noted in the interpolar right kidney. No
hydronephrosis. A 3.5 cm right renal upper pole cyst. There is no
hydronephrosis or nephrolithiasis on the left. The visualized
ureters and urinary bladder appear unremarkable.

Stomach/Bowel: There are postsurgical changes of the bowel with
anastomotic suture in the right lower quadrant. There is an 11 x 18
mm focal area of nodularity adjacent to the suture which is not well
evaluated but may represent scarring (coronal 43/6). There is mild
diffuse dilatation of small-bowel loops measuring up to 3.1 cm. No
definite transition identified. Oral contrast is noted in the
proximal colon. Liquid stool noted throughout the colon compatible
with diarrheal state.

Vascular/Lymphatic: Mild aortoiliac atherosclerotic disease. The IVC
is unremarkable. No portal venous gas. There is no adenopathy.

Reproductive: The uterus is retroflexed.  No adnexal masses.

Other: None

Musculoskeletal: Degenerative changes of the lower lumbar spine. No
acute osseous pathology. Avascular necrosis of the right femoral
head. No acute fracture or cortical collapse.
IMPRESSION: 1. Mildly dilated small bowel loops without transition may represent
ileus. Clinical correlation is recommended.
2. Diarrheal state. Correlation with clinical exam and stool
cultures recommended.
3. Fatty liver.
4. Nonobstructing right renal calculi. No hydronephrosis.
5. Aortic Atherosclerosis ([email or web address].[email or web address]).

## 2022-05-28 NOTE — Anesthesia Preprocedure Evaluation (Addendum)
Anesthesia Evaluation  Patient identified by MRN, date of birth, ID band Patient awake    Reviewed: Allergy & Precautions, NPO status , Patient's Chart, lab work & pertinent test results  Airway Mallampati: II  TM Distance: >3 FB Neck ROM: Full    Dental  (+) Partial Upper   Pulmonary Patient abstained from smoking., former smoker   Pulmonary exam normal        Cardiovascular hypertension, + CAD and + Past MI   Rhythm:Regular Rate:Normal     Neuro/Psych negative neurological ROS  negative psych ROS   GI/Hepatic Neg liver ROS,GERD  ,,screening   Endo/Other  negative endocrine ROS    Renal/GU   negative genitourinary   Musculoskeletal negative musculoskeletal ROS (+)    Abdominal Normal abdominal exam  (+)   Peds  Hematology negative hematology ROS (+)   Anesthesia Other Findings   Reproductive/Obstetrics                             Anesthesia Physical Anesthesia Plan  ASA: 3  Anesthesia Plan: MAC   Post-op Pain Management:    Induction: Intravenous  PONV Risk Score and Plan: 2 and Propofol infusion and Treatment may vary due to age or medical condition  Airway Management Planned: Simple Face Mask, Natural Airway and Nasal Cannula  Additional Equipment: None  Intra-op Plan:   Post-operative Plan:   Informed Consent: I have reviewed the patients History and Physical, chart, labs and discussed the procedure including the risks, benefits and alternatives for the proposed anesthesia with the patient or authorized representative who has indicated his/her understanding and acceptance.     Dental advisory given  Plan Discussed with: CRNA  Anesthesia Plan Comments:        Anesthesia Quick Evaluation

## 2022-05-29 ENCOUNTER — Encounter (HOSPITAL_COMMUNITY)
Admission: RE | Disposition: A | Discharge status: Home / Self Care | Payer: Self-pay | Source: Home / Self Care | Attending: Gastroenterology

## 2022-05-29 ENCOUNTER — Encounter (HOSPITAL_COMMUNITY): Payer: Self-pay | Admitting: Neurological Surgery

## 2022-05-29 ENCOUNTER — Ambulatory Visit (HOSPITAL_BASED_OUTPATIENT_CLINIC_OR_DEPARTMENT_OTHER): Payer: Medicare Other | Admitting: Certified Registered Nurse Anesthetist

## 2022-05-29 ENCOUNTER — Ambulatory Visit (HOSPITAL_COMMUNITY)
Admission: RE | Admit: 2022-05-29 | Discharge: 2022-05-29 | Disposition: A | Discharge status: Home / Self Care | Payer: Medicare Other | Attending: Gastroenterology | Admitting: Gastroenterology

## 2022-05-29 ENCOUNTER — Other Ambulatory Visit: Payer: Self-pay

## 2022-05-29 ENCOUNTER — Encounter (HOSPITAL_COMMUNITY): Payer: Self-pay | Admitting: Gastroenterology

## 2022-05-29 ENCOUNTER — Ambulatory Visit (HOSPITAL_COMMUNITY): Payer: Medicare Other | Admitting: Certified Registered Nurse Anesthetist

## 2022-05-29 ENCOUNTER — Other Ambulatory Visit (HOSPITAL_COMMUNITY): Payer: Self-pay

## 2022-05-29 DIAGNOSIS — Z980 Intestinal bypass and anastomosis status: Secondary | ICD-10-CM | POA: Insufficient documentation

## 2022-05-29 DIAGNOSIS — I2510 Atherosclerotic heart disease of native coronary artery without angina pectoris: Secondary | ICD-10-CM

## 2022-05-29 DIAGNOSIS — K6389 Other specified diseases of intestine: Secondary | ICD-10-CM | POA: Diagnosis not present

## 2022-05-29 DIAGNOSIS — I10 Essential (primary) hypertension: Secondary | ICD-10-CM | POA: Diagnosis not present

## 2022-05-29 DIAGNOSIS — K5730 Diverticulosis of large intestine without perforation or abscess without bleeding: Secondary | ICD-10-CM

## 2022-05-29 DIAGNOSIS — Z87891 Personal history of nicotine dependence: Secondary | ICD-10-CM

## 2022-05-29 DIAGNOSIS — K5080 Crohn's disease of both small and large intestine without complications: Secondary | ICD-10-CM | POA: Diagnosis not present

## 2022-05-29 DIAGNOSIS — Q438 Other specified congenital malformations of intestine: Secondary | ICD-10-CM | POA: Diagnosis not present

## 2022-05-29 DIAGNOSIS — Z1211 Encounter for screening for malignant neoplasm of colon: Secondary | ICD-10-CM | POA: Insufficient documentation

## 2022-05-29 DIAGNOSIS — I252 Old myocardial infarction: Secondary | ICD-10-CM | POA: Diagnosis not present

## 2022-05-29 DIAGNOSIS — Z8719 Personal history of other diseases of the digestive system: Secondary | ICD-10-CM | POA: Diagnosis not present

## 2022-05-29 DIAGNOSIS — K5090 Crohn's disease, unspecified, without complications: Secondary | ICD-10-CM | POA: Diagnosis not present

## 2022-05-29 DIAGNOSIS — K5000 Crohn's disease of small intestine without complications: Secondary | ICD-10-CM | POA: Diagnosis not present

## 2022-05-29 DIAGNOSIS — Z139 Encounter for screening, unspecified: Secondary | ICD-10-CM | POA: Diagnosis not present

## 2022-05-29 HISTORY — PX: COLONOSCOPY WITH PROPOFOL: SHX5780

## 2022-05-29 HISTORY — PX: BIOPSY: SHX5522

## 2022-05-29 SURGERY — COLONOSCOPY WITH PROPOFOL
Anesthesia: Monitor Anesthesia Care

## 2022-05-29 MED ORDER — PROPOFOL 10 MG/ML IV BOLUS
INTRAVENOUS | Status: DC | PRN
  Administered 2022-05-29: 20 mg via INTRAVENOUS

## 2022-05-29 MED ORDER — PROPOFOL 1000 MG/100ML IV EMUL
INTRAVENOUS | Status: AC
  Filled 2022-05-29: qty 100

## 2022-05-29 MED ORDER — PHENYLEPHRINE HCL (PRESSORS) 10 MG/ML IV SOLN
INTRAVENOUS | Status: AC
  Filled 2022-05-29: qty 1

## 2022-05-29 MED ORDER — LACTATED RINGERS IV SOLN
INTRAVENOUS | Status: DC
  Administered 2022-05-29: 1000 mL via INTRAVENOUS

## 2022-05-29 MED ORDER — EPHEDRINE SULFATE-NACL 50-0.9 MG/10ML-% IV SOSY
PREFILLED_SYRINGE | INTRAVENOUS | Status: DC | PRN
  Administered 2022-05-29: 10 mg via INTRAVENOUS
  Administered 2022-05-29 (×2): 7.5 mg via INTRAVENOUS

## 2022-05-29 MED ORDER — LIDOCAINE 2% (20 MG/ML) 5 ML SYRINGE
INTRAMUSCULAR | Status: DC | PRN
  Administered 2022-05-29: 40 mg via INTRAVENOUS

## 2022-05-29 MED ORDER — PROPOFOL 500 MG/50ML IV EMUL
INTRAVENOUS | Status: DC | PRN
  Administered 2022-05-29: 125 ug/kg/min via INTRAVENOUS

## 2022-05-29 MED ORDER — SODIUM CHLORIDE 0.9 % IV SOLN: INTRAVENOUS | Status: DC

## 2022-05-29 MED ORDER — PHENYLEPHRINE 80 MCG/ML (10ML) SYRINGE FOR IV PUSH (FOR BLOOD PRESSURE SUPPORT)
PREFILLED_SYRINGE | INTRAVENOUS | Status: DC | PRN
  Administered 2022-05-29: 80 ug via INTRAVENOUS
  Administered 2022-05-29: 120 ug via INTRAVENOUS
  Administered 2022-05-29 (×2): 80 ug via INTRAVENOUS

## 2022-05-29 SURGICAL SUPPLY — 22 items

## 2022-05-29 NOTE — Progress Notes (Signed)
Surgical Instructions    Your procedure is scheduled on Friday, June 05, 2022.  Report to [facility] Main Entrance "A" at 5:30 A.M., then check in with the Admitting office.  Call this number if you have problems the morning of surgery:  [phone]   If you have any questions prior to your surgery date call [phone]: Open Monday-Friday 8am-4pm If you experience any cold or flu symptoms such as cough, fever, chills, shortness of breath, etc. between now and your scheduled surgery, please notify us at the above number     Remember:  Do not eat or drink after midnight the night before your surgery    Take these medicines the morning of surgery with A SIP OF WATER:  cyclobenzaprine (FLEXERIL)  gabapentin (NEURONTIN)  rosuvastatin (CRESTOR)  sodium bicarbonate    As Needed: acetaminophen-codeine (TYLENOL #4)  nitroGLYCERIN (NITROSTAT)    Follow your surgeon's instructions on when to stop Aspirin.  If no instructions were given by your surgeon then you will need to call the office to get those instructions.    As of today, STOP taking any Aleve, Naproxen, Ibuprofen, Motrin, Advil, Goody's, BC's, all herbal medications, fish oil, and all vitamins.           Do not wear jewelry or makeup. Do not wear lotions, powders, perfumes or deodorant. Do not shave 48 hours prior to surgery.   Do not bring valuables to the hospital. Do not wear nail polish, gel polish, artificial nails, or any other type of covering on natural nails (fingers and toes) If you have artificial nails or gel coating that need to be removed by a nail salon, please have this removed prior to surgery. Artificial nails or gel coating may interfere with anesthesia's ability to adequately monitor your vital signs.  [facility] is not responsible for any belongings or valuables.    Do NOT Smoke (Tobacco/Vaping)  24 hours prior to your procedure  If you use a CPAP at night, you may bring your mask for your  overnight stay.   Contacts, glasses, hearing aids, dentures or partials may not be worn into surgery, please bring cases for these belongings   For patients admitted to the hospital, discharge time will be determined by your treatment team.   Patients discharged the day of surgery will not be allowed to drive home, and someone needs to stay with them for 24 hours.   SURGICAL WAITING ROOM VISITATION Patients having surgery or a procedure may have no more than 2 support people in the waiting area - these visitors may rotate.   Children under the age of 83 must have an adult with them who is not the patient. If the patient needs to stay at the hospital during part of their recovery, the visitor guidelines for inpatient rooms apply. Pre-op nurse will coordinate an appropriate time for 1 support person to accompany patient in pre-op.  This support person may not rotate.   Please refer to [email or web address] for the visitor guidelines for Inpatients (after your surgery is over and you are in a regular room).    Special instructions:    Oral Hygiene is also important to reduce your risk of infection.  Remember - BRUSH YOUR TEETH THE MORNING OF SURGERY WITH YOUR REGULAR TOOTHPASTE   [place]- Preparing For Surgery  Before surgery, you can play an important role. Because skin is not sterile, your skin needs to be as free of germs as possible. You can reduce the number  of germs on your skin by washing with CHG (chlorahexidine gluconate) Soap before surgery.  CHG is an antiseptic cleaner which kills germs and bonds with the skin to continue killing germs even after washing.     Please do not use if you have an allergy to CHG or antibacterial soaps. If your skin becomes reddened/irritated stop using the CHG.  Do not shave (including legs and underarms) for at least 48 hours prior to first CHG shower. It is OK to shave your face.  Please follow  these instructions carefully.     Shower the NIGHT BEFORE SURGERY and the MORNING OF SURGERY with CHG Soap.   If you chose to wash your hair, wash your hair first as usual with your normal shampoo. After you shampoo, rinse your hair and body thoroughly to remove the shampoo.  Then [employer] and genitals (private parts) with your normal soap and rinse thoroughly to remove soap.  After that Use CHG Soap as you would any other liquid soap. You can apply CHG directly to the skin and wash gently with a scrungie or a clean washcloth.   Apply the CHG Soap to your body ONLY FROM THE NECK DOWN.  Do not use on open wounds or open sores. Avoid contact with your eyes, ears, mouth and genitals (private parts). Wash Face and genitals (private parts)  with your normal soap.   Wash thoroughly, paying special attention to the area where your surgery will be performed.  Thoroughly rinse your body with warm water from the neck down.  DO NOT shower/wash with your normal soap after using and rinsing off the CHG Soap.  Pat yourself dry with a CLEAN TOWEL.  Wear CLEAN PAJAMAS to bed the night before surgery  Place CLEAN SHEETS on your bed the night before your surgery  DO NOT SLEEP WITH PETS.   Day of Surgery:  Take a shower with CHG soap. Wear Clean/Comfortable clothing the morning of surgery Do not apply any deodorants/lotions.   Remember to brush your teeth WITH YOUR REGULAR TOOTHPASTE.    If you received a COVID test during your pre-op visit, it is requested that you wear a mask when out in public, stay away from anyone that may not be feeling well, and notify your surgeon if you develop symptoms. If you have been in contact with anyone that has tested positive in the last 10 days, please notify your surgeon.    Please read over the following fact sheets that you were given.

## 2022-05-29 NOTE — Discharge Instructions (Signed)

## 2022-05-29 NOTE — Op Note (Signed)
[facility] Patient Name: Anne [name] Procedure Date: 05/29/2022 MRN: [id number] Attending MD: Carol Ada , MD, [id number] Date of Birth: October 17, 1955 CSN: [id number] Age: 67 Admit Type: Outpatient Procedure:                Colonoscopy Indications:              High risk colon cancer surveillance: Crohn's                            colitis of 8 (or more) years duration with                            one-third (or more) of the colon involved Providers:                Carol Ada, MD, Carlyn Reichert, RN, William Dalton, Technician, Tyrone Apple, Technician,                            Christell Faith, CRNA Referring MD:              Medicines:                Propofol per Anesthesia Complications:            No immediate complications. Estimated Blood Loss:     Estimated blood loss: none. Procedure:                Pre-Anesthesia Assessment:                           - Prior to the procedure, a History and Physical                            was performed, and patient medications and                            allergies were reviewed. The patient's tolerance of                            previous anesthesia was also reviewed. The risks                            and benefits of the procedure and the sedation                            options and risks were discussed with the patient.                            All questions were answered, and informed consent                            was obtained. Prior Anticoagulants: The patient has  taken no anticoagulant or antiplatelet agents. ASA                            Grade Assessment: III - A patient with severe                            systemic disease. After reviewing the risks and                            benefits, the patient was deemed in satisfactory                            condition to undergo the procedure.                           - Sedation was  administered by an anesthesia                            professional. Deep sedation was attained.                           After obtaining informed consent, the colonoscope                            was passed under direct vision. Throughout the                            procedure, the patient's blood pressure, pulse, and                            oxygen saturations were monitored continuously. The                            [id number]) Olympus colonoscope was                            introduced through the anus and advanced to the the                            terminal ileum. The colonoscopy was somewhat                            difficult due to poor endoscopic visualization and                            significant looping. Successful completion of the                            procedure was aided by using manual pressure,                            straightening and shortening the scope to obtain  bowel loop reduction and lavage. The patient                            tolerated the procedure well. The quality of the                            bowel preparation was evaluated using the BBPS                            [facility] Bowel Preparation Scale) with scores of:                            Right Colon = 2 (minor amount of residual staining,                            small fragments of stool and/or opaque liquid, but                            mucosa seen well), Transverse Colon = 2 (minor                            amount of residual staining, small fragments of                            stool and/or opaque liquid, but mucosa seen well)                            and Left Colon = 2 (minor amount of residual                            staining, small fragments of stool and/or opaque                            liquid, but mucosa seen well). The total BBPS score                            equals 6. The quality of the bowel preparation was                             good. The ileocecal valve, appendiceal orifice, and                            rectum were photographed. Scope In: 7:28:55 AM Scope Out: 7:53:06 AM Scope Withdrawal Time: 0 hours 15 minutes 56 seconds  Total Procedure Duration: 0 hours 24 minutes 11 seconds  Findings:      Patchy inflammation characterized by erosions was found in the terminal       ileum. The inflammation was severe. Biopsies were taken with a cold       forceps for histology.      There was evidence of a prior functional end-to-end colo-colonic       anastomosis in the ascending colon. This was patent and was       characterized by  erosion and inflammation. The anastomosis was traversed.      A single small-mouthed diverticulum was found in the ascending colon.      The current findings are unchanged from the prior surveillance       colonoscopies. Impression:               - Crohn's disease with ileitis. Inflammation was                            found. This was severe. Biopsied.                           - Patent functional end-to-end colo-colonic                            anastomosis, characterized by erosion and                            inflammation.                           - Diverticulosis in the ascending colon. Moderate Sedation:      Not Applicable - Patient had care per Anesthesia. Recommendation:           - Patient has a contact number available for                            emergencies. The signs and symptoms of potential                            delayed complications were discussed with the                            patient. Return to normal activities tomorrow.                            Written discharge instructions were provided to the                            patient.                           - Resume previous diet.                           - Continue present medications.                           - Await pathology results.                           - Repeat  colonoscopy in 2 years for surveillance.                            TWO DAY PREP.                           - Maintain infliximab.  Procedure Code(s):        --- Professional ---                           [phone], Colonoscopy, flexible; with biopsy, single                            or multiple Diagnosis Code(s):        --- Professional ---                           K50.00, Crohn's disease of small intestine without                            complications                           K50.10, Crohn's disease of large intestine without                            complications                           Z98.0, Intestinal bypass and anastomosis status                           K57.30, Diverticulosis of large intestine without                            perforation or abscess without bleeding CPT copyright 2022 American Medical Association. All rights reserved. The codes documented in this report are preliminary and upon coder review may  be revised to meet current compliance requirements. Carol Ada, MD Carol Ada, MD 05/29/2022 8:05:40 AM This report has been signed electronically. Number of Addenda: 0

## 2022-05-29 NOTE — Anesthesia Postprocedure Evaluation (Signed)
Anesthesia Post Note  Patient: Anne [name]  Procedure(s) Performed: COLONOSCOPY WITH PROPOFOL BIOPSY     Patient location during evaluation: PACU Anesthesia Type: MAC Level of consciousness: awake and alert Pain management: pain level controlled Vital Signs Assessment: post-procedure vital signs reviewed and stable Respiratory status: spontaneous breathing, nonlabored ventilation, respiratory function stable and patient connected to nasal cannula oxygen Cardiovascular status: stable and blood pressure returned to baseline Postop Assessment: no apparent nausea or vomiting Anesthetic complications: no   No notable events documented.  Last Vitals:  Vitals:   05/29/22 0810 05/29/22 0819  BP: 101/77 119/68  Pulse: 71 69  Resp: (!) 22 15  Temp:    SpO2: 100% 97%    Last Pain:  Vitals:   05/29/22 0810  TempSrc:   PainSc: 0-No pain                 March Rummage Bambie Pizzolato

## 2022-05-29 NOTE — Anesthesia Procedure Notes (Signed)
Procedure Name: MAC Date/Time: 05/29/2022 7:21 AM  Performed by: West Pugh, CRNAPre-anesthesia Checklist: Patient identified, Emergency Drugs available, Suction available, Patient being monitored and Timeout performed Patient Re-evaluated:Patient Re-evaluated prior to induction Oxygen Delivery Method: Simple face mask Preoxygenation: Pre-oxygenation with 100% oxygen Placement Confirmation: positive ETCO2 Dental Injury: Teeth and Oropharynx as per pre-operative assessment

## 2022-05-29 NOTE — H&P (Signed)
Anne [name] HPI: She is here for a repeat colonoscopy to evaluate the state of her Crohn's disease.  Past Medical History:  Diagnosis Date   Coronary artery disease    Crohn's colitis ([place])    GERD (gastroesophageal reflux disease)    Hyperlipidemia    Hypokalemia 08/04/2019   STEMI (ST elevation myocardial infarction) ([place])    10/27/17 PCI/DES x1 to the dRCA, with residual thrombus in the PLA, normal EF    Past Surgical History:  Procedure Laterality Date   ANTERIOR CERVICAL DECOMP/DISCECTOMY FUSION N/A 01/02/2022   Procedure: Anterior Cervical Decompression Fusion - Cervical three-Cervical four - Cervical four-Cervical five - Cervical five-Cervical six;  Surgeon: Eustace Mogg, MD;  Location: [place];  Service: Neurosurgery;  Laterality: N/A;   BIOPSY  09/22/2019   Procedure: BIOPSY;  Surgeon: Carol Ada, MD;  Location: [facility];  Service: Endoscopy;;   CHOLECYSTECTOMY  2005   COLONOSCOPY WITH PROPOFOL N/A 09/22/2019   Procedure: COLONOSCOPY WITH PROPOFOL;  Surgeon: Carol Ada, MD;  Location: [facility];  Service: Endoscopy;  Laterality: N/A;   CORONARY/GRAFT ACUTE MI REVASCULARIZATION N/A 10/26/2017   Procedure: Coronary/Graft Acute MI Revascularization;  Surgeon: Troy Sine, MD;  Location: [place] [facility];  Service: Cardiovascular;  Laterality: N/A;   FLEXIBLE SIGMOIDOSCOPY N/A 12/12/2021   Procedure: FLEXIBLE SIGMOIDOSCOPY;  Surgeon: Carol Ada, MD;  Location: [facility];  Service: Gastroenterology;  Laterality: N/A;   LEFT HEART CATH AND CORONARY ANGIOGRAPHY N/A 10/26/2017   Procedure: LEFT HEART CATH AND CORONARY ANGIOGRAPHY;  Surgeon: Troy Sine, MD;  Location: [place] [facility];  Service: Cardiovascular;  Laterality: N/A;   LEFT HEART CATH AND CORONARY ANGIOGRAPHY Left 05/05/2018   Procedure: Left heart cath and coronary angiography;  Surgeon: Dionisio David, MD;  Location: [place] [facility];  Service: Cardiovascular;  Laterality: Left;    MUSCLE BIOPSY Left 08/10/2019   Procedure: THIGH MUSCLE BIOPSY;  Surgeon: Coralie Keens, MD;  Location: [place];  Service: General;  Laterality: Left;   SMALL INTESTINE SURGERY     TONSILLECTOMY     removed as a child    Family History  Problem Relation Age of Onset   Stroke Mother    Crohn's disease Neg Hx     Social History:  reports that she quit smoking about 6 months ago. Her smoking use included cigarettes. She has a 11.25 pack-year smoking history. She has never used smokeless tobacco. She reports that she does not drink alcohol and does not use drugs.  Allergies: No Known Allergies  Medications: Scheduled: Continuous:  sodium chloride     lactated ringers 10 mL/hr at 05/29/22 0645    No results found for this or any previous visit (from the past 24 hour(s)).   No results found.  ROS:  As stated above in the HPI otherwise negative.  Blood pressure 110/74, pulse 72, temperature (!) 97.5 F (36.4 C), temperature source Tympanic, resp. rate 20, height 5' 9"$  (1.753 m), weight 70.8 kg, last menstrual period 02/18/2001, SpO2 97 %.    PE: Gen: NAD, Alert and Oriented HEENT:  [place]/AT, EOMI Neck: Supple, no LAD Lungs: CTA Bilaterally CV: RRR without M/G/R ABD: Soft, NTND, +BS Ext: No C/C/E  Assessment/Plan: 1) Crohn's disease - colonoscopy.  Anne [name] D 05/29/2022, 7:15 AM

## 2022-05-29 NOTE — Transfer of Care (Signed)
Immediate Anesthesia Transfer of Care Note  Patient: Anne [name]  Procedure(s) Performed: COLONOSCOPY WITH PROPOFOL BIOPSY  Patient Location: PACU and Endoscopy Unit  Anesthesia Type:MAC  Level of Consciousness: awake, alert , oriented, and patient cooperative  Airway & Oxygen Therapy: Patient Spontanous Breathing and Patient connected to nasal cannula oxygen  Post-op Assessment: Report given to RN and Post -op Vital signs reviewed and stable  Post vital signs: Reviewed and stable  Last Vitals:  Vitals Value Taken Time  BP 98/58 05/29/22 0759  Temp    Pulse 65 05/29/22 0759  Resp 16 05/29/22 0759  SpO2 100 % 05/29/22 0759  Vitals shown include unvalidated device data.  Last Pain:  Vitals:   05/29/22 0626  TempSrc: Tympanic  PainSc: 0-No pain         Complications: No notable events documented.

## 2022-05-31 ENCOUNTER — Encounter (HOSPITAL_COMMUNITY): Payer: Self-pay | Admitting: Gastroenterology

## 2022-06-01 ENCOUNTER — Encounter (HOSPITAL_COMMUNITY): Payer: Self-pay

## 2022-06-01 ENCOUNTER — Encounter (HOSPITAL_COMMUNITY)
Admission: RE | Admit: 2022-06-01 | Discharge: 2022-06-01 | Disposition: A | Discharge status: Home / Self Care | Payer: Medicare Other | Source: Ambulatory Visit | Attending: Neurological Surgery | Admitting: Neurological Surgery

## 2022-06-01 ENCOUNTER — Other Ambulatory Visit: Payer: Self-pay

## 2022-06-01 VITALS — BP 105/70 | HR 76 | Temp 98.2°F | Resp 16 | Ht 69.0 in | Wt 157.3 lb

## 2022-06-01 DIAGNOSIS — Z01812 Encounter for preprocedural laboratory examination: Secondary | ICD-10-CM | POA: Diagnosis not present

## 2022-06-01 DIAGNOSIS — Z01818 Encounter for other preprocedural examination: Secondary | ICD-10-CM

## 2022-06-01 LAB — CBC
HCT: 39.2 % (ref 36.0–46.0)
Hemoglobin: 13.1 g/dL (ref 12.0–15.0)
MCH: 30.4 pg (ref 26.0–34.0)
MCHC: 33.4 g/dL (ref 30.0–36.0)
MCV: 91 fL (ref 80.0–100.0)
Platelets: 394 10*3/uL (ref 150–400)
RBC: 4.31 MIL/uL (ref 3.87–5.11)
RDW: 19.9 % — ABNORMAL HIGH (ref 11.5–15.5)
WBC: 6.4 10*3/uL (ref 4.0–10.5)
nRBC: 0 % (ref 0.0–0.2)

## 2022-06-01 LAB — BASIC METABOLIC PANEL
Anion gap: 12 (ref 5–15)
BUN: 13 mg/dL (ref 8–23)
CO2: 23 mmol/L (ref 22–32)
Calcium: 8.8 mg/dL — ABNORMAL LOW (ref 8.9–10.3)
Chloride: 104 mmol/L (ref 98–111)
Creatinine, Ser: 1.24 mg/dL — ABNORMAL HIGH (ref 0.44–1.00)
GFR, Estimated: 47 mL/min — ABNORMAL LOW (ref >60)
Glucose, Bld: 94 mg/dL (ref 70–99)
Potassium: 4.1 mmol/L (ref 3.5–5.1)
Sodium: 139 mmol/L (ref 135–145)

## 2022-06-01 LAB — PROTIME-INR
INR: 1 (ref 0.8–1.2)
Prothrombin Time: 13.5 seconds (ref 11.4–15.2)

## 2022-06-01 LAB — SURGICAL PCR SCREEN
MRSA, PCR: POSITIVE — AB
Staphylococcus aureus: POSITIVE — AB

## 2022-06-01 LAB — SURGICAL PATHOLOGY

## 2022-06-01 NOTE — Progress Notes (Signed)
Left secure voicemail for Lorriane Shire, Dr. Adah Salvage surgical scheduler, informing her of pt's surgical PCR results. PCR was positive for MRSA and staph.

## 2022-06-01 NOTE — Progress Notes (Addendum)
PCP - Ahmed Tejan-SieMD Cardiologist - Kirk Ruths MD  PPM/ICD - denies Device Orders -  Rep Notified -   Chest x-ray - 02/01/20 EKG - 12/31/21 Stress Test -none ECHO - 10/28/19 Cardiac Cath - 05/05/18  Sleep Study - denies CPAP -   Fasting Blood Sugar - na Checks Blood Sugar _____ times a day  Last dose of GLP1 agonist-  na GLP1 instructions:   Blood Thinner Instructions:na Aspirin Instructions:Pt states she was instructed to stop her Aspirin 7 days prior to surgery. She says she last took it 05/25/2022.  ERAS Protcol -no PRE-SURGERY Ensure or G2-   COVID TEST- na   Anesthesia review: yes-hx MI ,CAD  Patient denies shortness of breath, fever, cough and chest pain at PAT appointment   All instructions explained to the patient, with a verbal understanding of the material. Patient agrees to go over the instructions while at home for a better understanding. Patient also instructed to wear a mask when out in public prior to surgery. The opportunity to ask questions was provided.

## 2022-06-02 NOTE — Progress Notes (Signed)
Anesthesia Chart Review:  Patient follows with cardiology for history of CAD s/p STEMI 2019 treated with DES to RCA.  Cath 04/2018 showed distal RCA lesion 30% stenosed, otherwise essentially normal coronaries.  Echo 04/2021 showed EF 65%, normal valves.  Nuclear stress 12/2021 was low risk, nonischemic.  Seen by Diona Browner, NP 03/18/2022 for preop evaluation.  Per note, "According to the Revised Cardiac Risk Index (RCRI), her Perioperative Risk of Major Cardiac Event is (%): 0.4. Her Functional Capacity in METs is: 6.45 according to the Duke Activity Status Index (DASI). Therefore, based on ACC/AHA guidelines, patient would be at acceptable risk for the planned procedure without further cardiovascular testing. The patient was advised that if she develops new symptoms prior to surgery to contact our office to arrange for a follow-up visit, and she verbalized understanding. Her aspirin may be held for 5-7 days prior to her procedure.  Please resume as soon as hemostasis is achieved."  Recently underwent C4-6 ACDF 0000000 without complication.  Preop labs reviewed, creatinine mildly elevated 1.24, otherwise unremarkable.  EKG 12/31/2021: NSR.  Rate 93.  Nonspecific T wave abnormality.  Nuclear stress 01/01/2022:   The study is low risk.   No ST deviation was noted.   LV perfusion is normal. There is no evidence of ischemia. There is no evidence of infarction. There is a mild inferior wall perfusion defect on rest and stress with an adjacent loop of bowel with significant extracardiac tracer uptake, likely contributing to attenuation artifact.   Left ventricular function is calculated as abnormal. There were no regional wall motion abnormalities. Nuclear stress EF: 46 %, however visually appears closer to 50-55%. The left ventricular ejection fraction is mildly decreased (45-54%). End diastolic cavity size is normal. End systolic cavity size is normal.   Prior study available for comparison from 03/13/2021.  Prior study unavailable for side by side comparison.  Nuclear stress 01/01/2022:   The study is low risk.   No ST deviation was noted.   LV perfusion is normal. There is no evidence of ischemia. There is no evidence of infarction. There is a mild inferior wall perfusion defect on rest and stress with an adjacent loop of bowel with significant extracardiac tracer uptake, likely contributing to attenuation artifact.   Left ventricular function is calculated as abnormal. There were no regional wall motion abnormalities. Nuclear stress EF: 46 %, however visually appears closer to 50-55%. The left ventricular ejection fraction is mildly decreased (45-54%). End diastolic cavity size is normal. End systolic cavity size is normal.   Prior study available for comparison from 03/13/2021. Prior study unavailable for side by side comparison.   Echo 04/2021 ([facility]): EF 65%. Normal chamber sizes. Normal left ventricular systolic function. Normal left ventricular diastolic dysfunction. Normal right ventricular systolic function. Normal left ventricular wall motion. Normal right ventricular wall motion. Trace tricuspid regurgitation. Normal pulmonary artery pressure. Trace mitral vegetation. No pericardial effusion.    Wynonia Musty [facility]/Anesthesiology Phone [phone] 06/02/2022 2:44 PM

## 2022-06-02 NOTE — Anesthesia Preprocedure Evaluation (Addendum)
Anesthesia Evaluation  Patient identified by MRN, date of birth, ID band Patient awake    Reviewed: Allergy & Precautions, H&P , NPO status , Patient's Chart, lab work & pertinent test results  Airway Mallampati: II  TM Distance: >3 FB Neck ROM: Full    Dental no notable dental hx. (+) Poor Dentition, Edentulous Upper, Missing, Dental Advisory Given,    Pulmonary neg pulmonary ROS, Patient abstained from smoking., former smoker   Pulmonary exam normal breath sounds clear to auscultation       Cardiovascular Exercise Tolerance: Good hypertension, + CAD and + Past MI  negative cardio ROS Normal cardiovascular exam Rhythm:Regular Rate:Normal     Neuro/Psych negative neurological ROS  negative psych ROS   GI/Hepatic negative GI ROS, Neg liver ROS,GERD  ,,  Endo/Other  negative endocrine ROS    Renal/GU Renal diseasenegative Renal ROS  negative genitourinary   Musculoskeletal negative musculoskeletal ROS (+)    Abdominal   Peds negative pediatric ROS (+)  Hematology negative hematology ROS (+)   Anesthesia Other Findings Intubation 9/23  Ventilation: Mask ventilation without difficulty Laryngoscope Size: 4 and Glidescope Grade View: Grade I Tube type: Oral Tube size: 7.5 mm Number of attempts: 1 Airway Equipment and Method: Stylet and Video-laryngoscopy   Reproductive/Obstetrics negative OB ROS                             Anesthesia Physical Anesthesia Plan  ASA: 3  Anesthesia Plan: General   Post-op Pain Management: Ofirmev IV (intra-op)*   Induction: Intravenous  PONV Risk Score and Plan: 3 and Ondansetron, Dexamethasone and Treatment may vary due to age or medical condition  Airway Management Planned: Oral ETT  Additional Equipment: ClearSight  Intra-op Plan:   Post-operative Plan: Extubation in OR  Informed Consent: I have reviewed the patients History and Physical,  chart, labs and discussed the procedure including the risks, benefits and alternatives for the proposed anesthesia with the patient or authorized representative who has indicated his/her understanding and acceptance.       Plan Discussed with: Anesthesiologist  Anesthesia Plan Comments: (PAT note by Karoline Caldwell, PA-C: Patient follows with cardiology for history of CAD s/p STEMI 2019 treated with DES to RCA.  Cath 04/2018 showed distal RCA lesion 30% stenosed, otherwise essentially normal coronaries.  Echo 04/2021 showed EF 65%, normal valves.  Nuclear stress 12/2021 was low risk, nonischemic.  Seen by Diona Browner, NP 03/18/2022 for preop evaluation.  Per note, "According to the Revised Cardiac Risk Index (RCRI),herPerioperative Risk of Major Cardiac Event is (%): 0.4.HerFunctional Capacity in METs is: 6.45according to the Duke Activity Status Index (DASI). Therefore, based on ACC/AHA guidelines, patient would be at acceptable risk for the planned procedure without further cardiovascular testing. The patient was advised that ifshedevelops new symptoms prior to surgery to contact our office to arrange for a follow-up visit, and sheverbalized understanding. Her aspirin may be held for 5-7 days prior to her procedure. Please resume as soon as hemostasis is achieved."  Recently underwent C4-6 ACDF 0000000 without complication.  Preop labs reviewed, creatinine mildly elevated 1.24, otherwise unremarkable.  EKG 12/31/2021: NSR.  Rate 93.  Nonspecific T wave abnormality.  Nuclear stress 01/01/2022:  The study is low risk.  No ST deviation was noted.  LV perfusion is normal. There is no evidence of ischemia. There is no evidence of infarction. There is a mild inferior wall perfusion defect on rest and stress with an  adjacent loop of bowel with significant extracardiac tracer uptake, likely contributing to attenuation artifact.  Left ventricular function is calculated as abnormal. There were  no regional wall motion abnormalities. Nuclear stress EF: 46 %, however visually appears closer to 50-55%. The left ventricular ejection fraction is mildly decreased (45-54%). End diastolic cavity size is normal. End systolic cavity size is normal.  Prior study available for comparison from 03/13/2021. Prior study unavailable for side by side comparison.  Nuclear stress 01/01/2022:  The study is low risk.  No ST deviation was noted.  LV perfusion is normal. There is no evidence of ischemia. There is no evidence of infarction. There is a mild inferior wall perfusion defect on rest and stress with an adjacent loop of bowel with significant extracardiac tracer uptake, likely contributing to attenuation artifact.  Left ventricular function is calculated as abnormal. There were no regional wall motion abnormalities. Nuclear stress EF: 46 %, however visually appears closer to 50-55%. The left ventricular ejection fraction is mildly decreased (45-54%). End diastolic cavity size is normal. End systolic cavity size is normal.  Prior study available for comparison from 03/13/2021. Prior study unavailable for side by side comparison.  Echo 04/2021 ([facility]): EF 65%. Normal chamber sizes. Normal left ventricular systolic function. Normal left ventricular diastolic dysfunction. Normal right ventricular systolic function. Normal left ventricular wall motion. Normal right ventricular wall motion. Trace tricuspid regurgitation. Normal pulmonary artery pressure. Trace mitral vegetation. No pericardial effusion.   )        Anesthesia Quick Evaluation

## 2022-06-05 ENCOUNTER — Encounter (HOSPITAL_COMMUNITY)
Admission: RE | Disposition: A | Discharge status: Home / Self Care | Payer: Self-pay | Source: Home / Self Care | Attending: Neurological Surgery

## 2022-06-05 ENCOUNTER — Other Ambulatory Visit: Payer: Self-pay

## 2022-06-05 ENCOUNTER — Encounter (HOSPITAL_COMMUNITY): Payer: Self-pay | Admitting: Neurological Surgery

## 2022-06-05 ENCOUNTER — Observation Stay (HOSPITAL_COMMUNITY)
Admission: RE | Admit: 2022-06-05 | Discharge: 2022-06-06 | Disposition: A | Discharge status: Home / Self Care | Payer: Medicare Other | Attending: Neurological Surgery | Admitting: Neurological Surgery

## 2022-06-05 ENCOUNTER — Ambulatory Visit (HOSPITAL_COMMUNITY): Payer: Medicare Other

## 2022-06-05 ENCOUNTER — Ambulatory Visit (HOSPITAL_COMMUNITY): Payer: Medicare Other | Admitting: Physician Assistant

## 2022-06-05 ENCOUNTER — Ambulatory Visit (HOSPITAL_BASED_OUTPATIENT_CLINIC_OR_DEPARTMENT_OTHER): Payer: Medicare Other | Admitting: Anesthesiology

## 2022-06-05 DIAGNOSIS — M5416 Radiculopathy, lumbar region: Secondary | ICD-10-CM | POA: Diagnosis not present

## 2022-06-05 DIAGNOSIS — I2510 Atherosclerotic heart disease of native coronary artery without angina pectoris: Secondary | ICD-10-CM | POA: Diagnosis not present

## 2022-06-05 DIAGNOSIS — Z79899 Other long term (current) drug therapy: Secondary | ICD-10-CM | POA: Insufficient documentation

## 2022-06-05 DIAGNOSIS — Z9889 Other specified postprocedural states: Secondary | ICD-10-CM

## 2022-06-05 DIAGNOSIS — M48061 Spinal stenosis, lumbar region without neurogenic claudication: Secondary | ICD-10-CM | POA: Diagnosis not present

## 2022-06-05 DIAGNOSIS — Z01818 Encounter for other preprocedural examination: Secondary | ICD-10-CM

## 2022-06-05 DIAGNOSIS — I10 Essential (primary) hypertension: Secondary | ICD-10-CM | POA: Insufficient documentation

## 2022-06-05 DIAGNOSIS — I252 Old myocardial infarction: Secondary | ICD-10-CM

## 2022-06-05 DIAGNOSIS — M5417 Radiculopathy, lumbosacral region: Secondary | ICD-10-CM | POA: Diagnosis not present

## 2022-06-05 DIAGNOSIS — Z7982 Long term (current) use of aspirin: Secondary | ICD-10-CM | POA: Insufficient documentation

## 2022-06-05 DIAGNOSIS — M5116 Intervertebral disc disorders with radiculopathy, lumbar region: Secondary | ICD-10-CM | POA: Diagnosis not present

## 2022-06-05 DIAGNOSIS — Z87891 Personal history of nicotine dependence: Secondary | ICD-10-CM

## 2022-06-05 HISTORY — DX: Pure hypercholesterolemia, unspecified: E78.00

## 2022-06-05 HISTORY — DX: Crohn's disease, unspecified, without complications: K50.90

## 2022-06-05 HISTORY — PX: LUMBAR LAMINECTOMY/DECOMPRESSION MICRODISCECTOMY: SHX5026

## 2022-06-05 HISTORY — DX: Essential (primary) hypertension: I10

## 2022-06-05 LAB — BASIC METABOLIC PANEL
Anion gap: 11 (ref 5–15)
BUN: 14 mg/dL (ref 8–23)
CO2: 21 mmol/L — ABNORMAL LOW (ref 22–32)
Calcium: 8.8 mg/dL — ABNORMAL LOW (ref 8.9–10.3)
Chloride: 108 mmol/L (ref 98–111)
Creatinine, Ser: 1.24 mg/dL — ABNORMAL HIGH (ref 0.44–1.00)
GFR, Estimated: 47 mL/min — ABNORMAL LOW (ref >60)
Glucose, Bld: 95 mg/dL (ref 70–99)
Potassium: 4 mmol/L (ref 3.5–5.1)
Sodium: 140 mmol/L (ref 135–145)

## 2022-06-05 LAB — CBC
HCT: 41.8 % (ref 36.0–46.0)
Hemoglobin: 12.9 g/dL (ref 12.0–15.0)
MCH: 29.9 pg (ref 26.0–34.0)
MCHC: 30.9 g/dL (ref 30.0–36.0)
MCV: 97 fL (ref 80.0–100.0)
Platelets: 240 10*3/uL (ref 150–400)
RBC: 4.31 MIL/uL (ref 3.87–5.11)
RDW: 20.1 % — ABNORMAL HIGH (ref 11.5–15.5)
WBC: 4.8 10*3/uL (ref 4.0–10.5)
nRBC: 0 % (ref 0.0–0.2)

## 2022-06-05 LAB — PROTIME-INR
INR: 1.1 (ref 0.8–1.2)
Prothrombin Time: 14.1 seconds (ref 11.4–15.2)

## 2022-06-05 SURGERY — LUMBAR LAMINECTOMY/DECOMPRESSION MICRODISCECTOMY 2 LEVELS
Anesthesia: General | Site: Back | Laterality: Left

## 2022-06-05 MED ORDER — GABAPENTIN 400 MG PO CAPS
400.0000 mg | ORAL_CAPSULE | Freq: Two times a day (BID) | ORAL | Status: DC
  Administered 2022-06-05 (×2): 400 mg via ORAL
  Filled 2022-06-05 (×2): qty 1

## 2022-06-05 MED ORDER — CHLORHEXIDINE GLUCONATE CLOTH 2 % EX PADS: 6.0000 | MEDICATED_PAD | Freq: Once | CUTANEOUS | Status: DC

## 2022-06-05 MED ORDER — ACETAMINOPHEN-CODEINE 300-30 MG PO TABS
1.0000 | ORAL_TABLET | Freq: Four times a day (QID) | ORAL | Status: DC | PRN
  Administered 2022-06-05 – 2022-06-06 (×4): 1 via ORAL
  Filled 2022-06-05 (×4): qty 1

## 2022-06-05 MED ORDER — BUPIVACAINE HCL (PF) 0.25 % IJ SOLN
INTRAMUSCULAR | Status: AC
  Filled 2022-06-05: qty 30

## 2022-06-05 MED ORDER — GLYCOPYRROLATE PF 0.2 MG/ML IJ SOSY
PREFILLED_SYRINGE | INTRAMUSCULAR | Status: DC | PRN
  Administered 2022-06-05: .2 mg via INTRAVENOUS

## 2022-06-05 MED ORDER — ONDANSETRON HCL 4 MG/2ML IJ SOLN
INTRAMUSCULAR | Status: DC | PRN
  Administered 2022-06-05: 4 mg via INTRAVENOUS

## 2022-06-05 MED ORDER — ROCURONIUM BROMIDE 10 MG/ML (PF) SYRINGE
PREFILLED_SYRINGE | INTRAVENOUS | Status: DC | PRN
  Administered 2022-06-05: 70 mg via INTRAVENOUS
  Administered 2022-06-05: 10 mg via INTRAVENOUS

## 2022-06-05 MED ORDER — PROPOFOL 10 MG/ML IV BOLUS
INTRAVENOUS | Status: DC | PRN
  Administered 2022-06-05: 120 mg via INTRAVENOUS

## 2022-06-05 MED ORDER — ALBUMIN HUMAN 5 % IV SOLN: INTRAVENOUS | Status: DC | PRN

## 2022-06-05 MED ORDER — FENTANYL CITRATE (PF) 100 MCG/2ML IJ SOLN
25.0000 ug | INTRAMUSCULAR | Status: DC | PRN
  Administered 2022-06-05 (×3): 50 ug via INTRAVENOUS

## 2022-06-05 MED ORDER — ACETAMINOPHEN-CODEINE 300-60 MG PO TABS: 1.0000 | ORAL_TABLET | Freq: Four times a day (QID) | ORAL | Status: DC | PRN

## 2022-06-05 MED ORDER — CELECOXIB 200 MG PO CAPS
200.0000 mg | ORAL_CAPSULE | Freq: Two times a day (BID) | ORAL | Status: DC
  Administered 2022-06-05 (×2): 200 mg via ORAL
  Filled 2022-06-05 (×2): qty 1

## 2022-06-05 MED ORDER — CEFAZOLIN SODIUM-DEXTROSE 2-4 GM/100ML-% IV SOLN
2.0000 g | Freq: Three times a day (TID) | INTRAVENOUS | Status: AC
  Administered 2022-06-05 (×2): 2 g via INTRAVENOUS
  Filled 2022-06-05 (×2): qty 100

## 2022-06-05 MED ORDER — CODEINE SULFATE 15 MG PO TABS
30.0000 mg | ORAL_TABLET | Freq: Four times a day (QID) | ORAL | Status: DC | PRN
  Administered 2022-06-05 – 2022-06-06 (×4): 30 mg via ORAL
  Filled 2022-06-05 (×4): qty 2

## 2022-06-05 MED ORDER — EPHEDRINE SULFATE-NACL 50-0.9 MG/10ML-% IV SOSY
PREFILLED_SYRINGE | INTRAVENOUS | Status: DC | PRN
  Administered 2022-06-05 (×3): 5 mg via INTRAVENOUS

## 2022-06-05 MED ORDER — ACETAMINOPHEN 650 MG RE SUPP: 650.0000 mg | RECTAL | Status: DC | PRN

## 2022-06-05 MED ORDER — FENTANYL CITRATE (PF) 250 MCG/5ML IJ SOLN
INTRAMUSCULAR | Status: AC
  Filled 2022-06-05: qty 5

## 2022-06-05 MED ORDER — CHLORHEXIDINE GLUCONATE 0.12 % MT SOLN: 15.0000 mL | Freq: Once | OROMUCOSAL | Status: DC

## 2022-06-05 MED ORDER — POTASSIUM CHLORIDE IN NACL 20-0.9 MEQ/L-% IV SOLN: INTRAVENOUS | Status: DC

## 2022-06-05 MED ORDER — ADULT MULTIVITAMIN W/MINERALS CH: 1.0000 | ORAL_TABLET | Freq: Every day | ORAL | Status: DC

## 2022-06-05 MED ORDER — EPHEDRINE 5 MG/ML INJ
INTRAVENOUS | Status: AC
  Filled 2022-06-05: qty 5

## 2022-06-05 MED ORDER — MIDAZOLAM HCL 2 MG/2ML IJ SOLN
INTRAMUSCULAR | Status: DC | PRN
  Administered 2022-06-05 (×2): 1 mg via INTRAVENOUS

## 2022-06-05 MED ORDER — SODIUM BICARBONATE 650 MG PO TABS
1950.0000 mg | ORAL_TABLET | Freq: Two times a day (BID) | ORAL | Status: DC
  Administered 2022-06-05 (×2): 1950 mg via ORAL
  Filled 2022-06-05 (×3): qty 3

## 2022-06-05 MED ORDER — MENTHOL 3 MG MT LOZG: 1.0000 | LOZENGE | OROMUCOSAL | Status: DC | PRN

## 2022-06-05 MED ORDER — FENTANYL CITRATE (PF) 100 MCG/2ML IJ SOLN
INTRAMUSCULAR | Status: AC
  Filled 2022-06-05: qty 2

## 2022-06-05 MED ORDER — ACETAMINOPHEN 160 MG/5ML PO SOLN: 325.0000 mg | ORAL | Status: DC | PRN

## 2022-06-05 MED ORDER — ACETAMINOPHEN 325 MG PO TABS: 325.0000 mg | ORAL_TABLET | ORAL | Status: DC | PRN

## 2022-06-05 MED ORDER — DEXAMETHASONE SODIUM PHOSPHATE 4 MG/ML IJ SOLN
4.0000 mg | Freq: Four times a day (QID) | INTRAMUSCULAR | Status: DC
  Administered 2022-06-05: 4 mg via INTRAVENOUS
  Filled 2022-06-05: qty 1

## 2022-06-05 MED ORDER — GLYCOPYRROLATE PF 0.2 MG/ML IJ SOSY
PREFILLED_SYRINGE | INTRAMUSCULAR | Status: AC
  Filled 2022-06-05: qty 1

## 2022-06-05 MED ORDER — OXYCODONE HCL 5 MG/5ML PO SOLN: 5.0000 mg | Freq: Once | ORAL | Status: AC | PRN

## 2022-06-05 MED ORDER — BUPIVACAINE HCL (PF) 0.25 % IJ SOLN
INTRAMUSCULAR | Status: DC | PRN
  Administered 2022-06-05: 10 mL

## 2022-06-05 MED ORDER — DEXAMETHASONE 4 MG PO TABS
4.0000 mg | ORAL_TABLET | Freq: Four times a day (QID) | ORAL | Status: DC
  Administered 2022-06-05 – 2022-06-06 (×3): 4 mg via ORAL
  Filled 2022-06-05 (×3): qty 1

## 2022-06-05 MED ORDER — 0.9 % SODIUM CHLORIDE (POUR BTL) OPTIME
TOPICAL | Status: DC | PRN
  Administered 2022-06-05: 1000 mL

## 2022-06-05 MED ORDER — LACTATED RINGERS IV SOLN: INTRAVENOUS | Status: DC

## 2022-06-05 MED ORDER — EZETIMIBE 10 MG PO TABS
10.0000 mg | ORAL_TABLET | Freq: Every day | ORAL | Status: DC
  Administered 2022-06-05: 10 mg via ORAL
  Filled 2022-06-05: qty 1

## 2022-06-05 MED ORDER — ACETAMINOPHEN 500 MG PO TABS
1000.0000 mg | ORAL_TABLET | ORAL | Status: AC
  Administered 2022-06-05: 1000 mg via ORAL
  Filled 2022-06-05: qty 2

## 2022-06-05 MED ORDER — MORPHINE SULFATE (PF) 2 MG/ML IV SOLN: 2.0000 mg | INTRAVENOUS | Status: DC | PRN

## 2022-06-05 MED ORDER — ACETAMINOPHEN 325 MG PO TABS: 650.0000 mg | ORAL_TABLET | ORAL | Status: DC | PRN

## 2022-06-05 MED ORDER — CYCLOBENZAPRINE HCL 10 MG PO TABS
10.0000 mg | ORAL_TABLET | Freq: Three times a day (TID) | ORAL | Status: DC | PRN
  Administered 2022-06-05: 10 mg via ORAL
  Filled 2022-06-05: qty 1

## 2022-06-05 MED ORDER — SUGAMMADEX SODIUM 200 MG/2ML IV SOLN
INTRAVENOUS | Status: DC | PRN
  Administered 2022-06-05: 200 mg via INTRAVENOUS

## 2022-06-05 MED ORDER — THROMBIN 5000 UNITS EX SOLR
CUTANEOUS | Status: AC
  Filled 2022-06-05: qty 5000

## 2022-06-05 MED ORDER — ALBUTEROL SULFATE (2.5 MG/3ML) 0.083% IN NEBU
INHALATION_SOLUTION | RESPIRATORY_TRACT | Status: AC
  Filled 2022-06-05: qty 3

## 2022-06-05 MED ORDER — FENTANYL CITRATE (PF) 250 MCG/5ML IJ SOLN
INTRAMUSCULAR | Status: DC | PRN
  Administered 2022-06-05 (×2): 50 ug via INTRAVENOUS

## 2022-06-05 MED ORDER — GABAPENTIN 300 MG PO CAPS
300.0000 mg | ORAL_CAPSULE | ORAL | Status: AC
  Administered 2022-06-05: 300 mg via ORAL
  Filled 2022-06-05: qty 1

## 2022-06-05 MED ORDER — DEXAMETHASONE SODIUM PHOSPHATE 10 MG/ML IJ SOLN
INTRAMUSCULAR | Status: DC | PRN
  Administered 2022-06-05: 10 mg via INTRAVENOUS

## 2022-06-05 MED ORDER — LIDOCAINE 2% (20 MG/ML) 5 ML SYRINGE
INTRAMUSCULAR | Status: AC
  Filled 2022-06-05: qty 5

## 2022-06-05 MED ORDER — ONDANSETRON HCL 4 MG/2ML IJ SOLN
INTRAMUSCULAR | Status: AC
  Filled 2022-06-05: qty 2

## 2022-06-05 MED ORDER — ORAL CARE MOUTH RINSE: 15.0000 mL | Freq: Once | OROMUCOSAL | Status: DC

## 2022-06-05 MED ORDER — DEXAMETHASONE SODIUM PHOSPHATE 10 MG/ML IJ SOLN
INTRAMUSCULAR | Status: AC
  Filled 2022-06-05: qty 1

## 2022-06-05 MED ORDER — ALBUTEROL SULFATE (2.5 MG/3ML) 0.083% IN NEBU
2.5000 mg | INHALATION_SOLUTION | Freq: Once | RESPIRATORY_TRACT | Status: AC
  Administered 2022-06-05: 2.5 mg via RESPIRATORY_TRACT

## 2022-06-05 MED ORDER — OXYCODONE HCL 5 MG PO TABS
ORAL_TABLET | ORAL | Status: AC
  Filled 2022-06-05: qty 1

## 2022-06-05 MED ORDER — VANCOMYCIN HCL IN DEXTROSE 1-5 GM/200ML-% IV SOLN: 1000.0000 mg | Freq: Once | INTRAVENOUS | Status: AC

## 2022-06-05 MED ORDER — CHLORHEXIDINE GLUCONATE 0.12 % MT SOLN
15.0000 mL | Freq: Once | OROMUCOSAL | Status: AC
  Administered 2022-06-05: 15 mL via OROMUCOSAL
  Filled 2022-06-05: qty 15

## 2022-06-05 MED ORDER — VANCOMYCIN HCL IN DEXTROSE 1-5 GM/200ML-% IV SOLN
INTRAVENOUS | Status: AC
  Administered 2022-06-05: 1000 mg via INTRAVENOUS
  Filled 2022-06-05: qty 200

## 2022-06-05 MED ORDER — ORAL CARE MOUTH RINSE: 15.0000 mL | Freq: Once | OROMUCOSAL | Status: AC

## 2022-06-05 MED ORDER — THROMBIN 5000 UNITS EX SOLR: OROMUCOSAL | Status: DC | PRN

## 2022-06-05 MED ORDER — SODIUM CHLORIDE 0.9% FLUSH
3.0000 mL | Freq: Two times a day (BID) | INTRAVENOUS | Status: DC
  Administered 2022-06-05 (×2): 3 mL via INTRAVENOUS

## 2022-06-05 MED ORDER — SODIUM CHLORIDE 0.9 % IV SOLN
250.0000 mL | INTRAVENOUS | Status: DC
  Administered 2022-06-05: 250 mL via INTRAVENOUS

## 2022-06-05 MED ORDER — OXYCODONE HCL 5 MG PO TABS
5.0000 mg | ORAL_TABLET | Freq: Once | ORAL | Status: AC | PRN
  Administered 2022-06-05: 5 mg via ORAL

## 2022-06-05 MED ORDER — CHOLESTYRAMINE 4 G PO PACK
4.0000 g | PACK | Freq: Two times a day (BID) | ORAL | Status: DC
  Administered 2022-06-05: 4 g via ORAL
  Filled 2022-06-05 (×3): qty 1

## 2022-06-05 MED ORDER — CEFAZOLIN SODIUM-DEXTROSE 2-4 GM/100ML-% IV SOLN
2.0000 g | INTRAVENOUS | Status: DC
  Filled 2022-06-05: qty 100

## 2022-06-05 MED ORDER — PROPOFOL 10 MG/ML IV BOLUS
INTRAVENOUS | Status: AC
  Filled 2022-06-05: qty 20

## 2022-06-05 MED ORDER — MIDAZOLAM HCL 2 MG/2ML IJ SOLN
INTRAMUSCULAR | Status: AC
  Filled 2022-06-05: qty 2

## 2022-06-05 MED ORDER — PHENOL 1.4 % MT LIQD: 1.0000 | OROMUCOSAL | Status: DC | PRN

## 2022-06-05 MED ORDER — PHENYLEPHRINE HCL-NACL 20-0.9 MG/250ML-% IV SOLN
INTRAVENOUS | Status: DC | PRN
  Administered 2022-06-05: 75 ug/min via INTRAVENOUS

## 2022-06-05 MED ORDER — SODIUM CHLORIDE 0.9% FLUSH: 3.0000 mL | INTRAVENOUS | Status: DC | PRN

## 2022-06-05 MED ORDER — ROCURONIUM BROMIDE 10 MG/ML (PF) SYRINGE
PREFILLED_SYRINGE | INTRAVENOUS | Status: AC
  Filled 2022-06-05: qty 10

## 2022-06-05 MED ORDER — LIDOCAINE 2% (20 MG/ML) 5 ML SYRINGE
INTRAMUSCULAR | Status: DC | PRN
  Administered 2022-06-05: 100 mg via INTRAVENOUS

## 2022-06-05 MED ORDER — SENNA 8.6 MG PO TABS
1.0000 | ORAL_TABLET | Freq: Two times a day (BID) | ORAL | Status: DC
  Administered 2022-06-05 (×2): 8.6 mg via ORAL
  Filled 2022-06-05 (×2): qty 1

## 2022-06-05 SURGICAL SUPPLY — 39 items
BAG COUNTER SPONGE SURGICOUNT (BAG) ×1 IMPLANT
BAND RUBBER #18 3X1/16 STRL (MISCELLANEOUS) ×2 IMPLANT
BENZOIN TINCTURE PRP APPL 2/3 (GAUZE/BANDAGES/DRESSINGS) ×1 IMPLANT
BUR CARBIDE MATCH 3.0 (BURR) ×1 IMPLANT
CANISTER SUCT 3000ML PPV (MISCELLANEOUS) ×1 IMPLANT
DRAPE LAPAROTOMY 100X72X124 (DRAPES) ×1 IMPLANT
DRAPE MICROSCOPE SLANT 54X150 (MISCELLANEOUS) ×1 IMPLANT
DRAPE SURG 17X23 STRL (DRAPES) ×1 IMPLANT
DRSG OPSITE 4X5.5 SM (GAUZE/BANDAGES/DRESSINGS) IMPLANT
DURAPREP 26ML APPLICATOR (WOUND CARE) ×1 IMPLANT
ELECT REM PT RETURN 9FT ADLT (ELECTROSURGICAL) ×1
ELECTRODE REM PT RTRN 9FT ADLT (ELECTROSURGICAL) ×1 IMPLANT
GAUZE 4X4 16PLY ~~LOC~~+RFID DBL (SPONGE) IMPLANT
GLOVE BIO SURGEON STRL SZ7 (GLOVE) IMPLANT
GLOVE BIO SURGEON STRL SZ8 (GLOVE) ×1 IMPLANT
GLOVE BIOGEL PI IND STRL 7.0 (GLOVE) IMPLANT
GOWN STRL REUS W/ TWL LRG LVL3 (GOWN DISPOSABLE) IMPLANT
GOWN STRL REUS W/ TWL XL LVL3 (GOWN DISPOSABLE) ×1 IMPLANT
GOWN STRL REUS W/TWL 2XL LVL3 (GOWN DISPOSABLE) IMPLANT
GOWN STRL REUS W/TWL LRG LVL3 (GOWN DISPOSABLE)
GOWN STRL REUS W/TWL XL LVL3 (GOWN DISPOSABLE) ×1
HEMOSTAT POWDER KIT SURGIFOAM (HEMOSTASIS) ×1 IMPLANT
KIT BASIN OR (CUSTOM PROCEDURE TRAY) ×1 IMPLANT
KIT TURNOVER KIT B (KITS) ×1 IMPLANT
NDL HYPO 25X1 1.5 SAFETY (NEEDLE) ×1 IMPLANT
NDL SPNL 20GX3.5 QUINCKE YW (NEEDLE) IMPLANT
NEEDLE HYPO 25X1 1.5 SAFETY (NEEDLE) ×1 IMPLANT
NEEDLE SPNL 20GX3.5 QUINCKE YW (NEEDLE) IMPLANT
NS IRRIG 1000ML POUR BTL (IV SOLUTION) ×1 IMPLANT
PACK LAMINECTOMY NEURO (CUSTOM PROCEDURE TRAY) ×1 IMPLANT
PAD ARMBOARD 7.5X6 YLW CONV (MISCELLANEOUS) ×3 IMPLANT
STRIP CLOSURE SKIN 1/2X4 (GAUZE/BANDAGES/DRESSINGS) ×1 IMPLANT
SUT VIC AB 0 CT1 18XCR BRD8 (SUTURE) ×1 IMPLANT
SUT VIC AB 0 CT1 8-18 (SUTURE) ×1
SUT VIC AB 2-0 CP2 18 (SUTURE) ×1 IMPLANT
SUT VIC AB 3-0 SH 8-18 (SUTURE) ×1 IMPLANT
TOWEL GREEN STERILE (TOWEL DISPOSABLE) ×1 IMPLANT
TOWEL GREEN STERILE FF (TOWEL DISPOSABLE) ×1 IMPLANT
WATER STERILE IRR 1000ML POUR (IV SOLUTION) ×1 IMPLANT

## 2022-06-05 NOTE — Progress Notes (Signed)
Patient ID: Anne [name], female   DOB: 1955/07/03, 67 y.o.   MRN: [id number] She looks good postop complaining only of appropriate back soreness and no leg pain or numbness tingling or weakness.  She has walked to the bathroom.  She is tolerating oral intake.

## 2022-06-05 NOTE — Op Note (Signed)
06/05/2022  10:03 AM  PATIENT:  Anne [name]  67 y.o. female  PRE-OPERATIVE DIAGNOSIS: Left L4-5 and L5-S1 spinal stenosis with left lower extremity radiculopathy  POST-OPERATIVE DIAGNOSIS:  same  PROCEDURE: Left L4-5 L5-S1 hemilaminectomy medial facetectomy and foraminotomy with left L5-S1 microdiscectomy utilizing microscopic dissection  SURGEON:  Sherley Bounds, MD  ASSISTANTS: Glenford Peers, FNP  ANESTHESIA:   General  EBL: 50 ml  Total I/O In: 1050 [I.V.:800; IV Piggyback:250] Out: 50 [Blood:50]  BLOOD ADMINISTERED: none  DRAINS: None  SPECIMEN:  none  INDICATION FOR PROCEDURE: This patient presented with very left leg pain. Imaging showed ankylosis at L4-5 with a large calcified disc herniation with central and left lateral recess stenosis compressing the left L5 nerve root, and severe left foraminal stenosis L5-S1 compressing the left L5 nerve root. The patient tried conservative measures without relief. Pain was debilitating. Recommended left L4-5 and L5-S1 decompressive hemilaminectomies. Patient understood the risks, benefits, and alternatives and potential outcomes and wished to proceed.  PROCEDURE DETAILS: The patient was taken to the operating room and after induction of adequate generalized endotracheal anesthesia, the patient was rolled into the prone position on the Wilson frame and all pressure points were padded. The lumbar region was cleaned and then prepped with DuraPrep and draped in the usual sterile fashion. 5 cc of local anesthesia was injected and then a dorsal midline incision was made and carried down to the lumbo sacral fascia. The fascia was opened and the paraspinous musculature was taken down in a subperiosteal fashion to expose L4-5 and L5-S1 on the left. Intraoperative x-ray confirmed my level, and then I used a combination of the high-speed drill and the Kerrison punches to perform a hemilaminectomy, medial facetectomy, and foraminotomy at L4 5 and  L5-S1 on the left. The underlying yellow ligament was opened and removed in a piecemeal fashion to expose the underlying dura and exiting nerve root. I undercut the lateral recess and dissected down until I was medial to and distal to the L5 pedicle.  I performed a much more generous medial facetectomy and foraminotomies at both levels, drilling back the pars and medial facet of L5 to follow the L5 nerve root out distal to the pedicle.  There was significant compression of the L5 nerve root from ligament above and a disc protrusion in the axilla of the nerve root extending distally.  The nerve root was well decompressed. We then incised the disc space at L5-S1 on the left. I performed a thorough discectomy with pituitary rongeurs utilizing microscopic dissection under the operating microscope, until I had a nice decompression of the nerve root distally into the foramen. I then palpated with a coronary dilator along the nerve root and into the foramen to assure adequate decompression. I felt no more compression of the nerve root.  At L4-5 there was a large calcified disc herniation, but we felt trying to remove this offered more risk than potential benefit after the decompression.  I irrigated with saline solution containing bacitracin. Achieved hemostasis with bipolar cautery, lined the dura with Surgifoam which was irrigated away, and then closed the fascia with 0 Vicryl. I closed the subcutaneous tissues with 2-0 Vicryl and the subcuticular tissues with 3-0 Vicryl. The skin was then closed with benzoin and Steri-Strips. The drapes were removed, a sterile dressing was applied.  My nurse practitioner was involved in the exposure, safe retraction of the neural elements, the disc work and the closure. the patient was awakened from general anesthesia and  transferred to the recovery room in stable condition. At the end of the procedure all sponge, needle and instrument counts were correct.    PLAN OF CARE: Admit for  overnight observation  PATIENT DISPOSITION:  PACU - hemodynamically stable.   Delay start of Pharmacological VTE agent (>24hrs) due to surgical blood loss or risk of bleeding:  yes

## 2022-06-05 NOTE — Anesthesia Postprocedure Evaluation (Signed)
Anesthesia Post Note  Patient: Anne [name]  Procedure(s) Performed: LAMINECTOMY AND FORAMINOTOMY - LUMBAR FOUR-LUMBAR FIVE - LUMBAR FIVE-SACRAL ONE - LEFT (Left: Back)     Patient location during evaluation: PACU Anesthesia Type: General Level of consciousness: awake and alert Pain management: pain level controlled Vital Signs Assessment: post-procedure vital signs reviewed and stable Respiratory status: spontaneous breathing, nonlabored ventilation, respiratory function stable and patient connected to nasal cannula oxygen Cardiovascular status: blood pressure returned to baseline and stable Postop Assessment: no apparent nausea or vomiting Anesthetic complications: no   No notable events documented.  Last Vitals:  Vitals:   06/05/22 1100 06/05/22 1126  BP: 114/64 125/72  Pulse: 66 61  Resp: 16 20  Temp:    SpO2: 95% 96%    Last Pain:  Vitals:   06/05/22 1030  TempSrc:   PainSc: 5                  Rateel Beldin

## 2022-06-05 NOTE — Progress Notes (Signed)
Dr. Ronnald Ramp made aware of patient's positive MRSA, PCR. Verbal order received for Vancomycin 1 gram.

## 2022-06-05 NOTE — Anesthesia Procedure Notes (Signed)
Procedure Name: Intubation Date/Time: 06/05/2022 7:37 AM  Performed by: Michele Rockers, CRNAPre-anesthesia Checklist: Patient identified, Patient being monitored, Timeout performed, Emergency Drugs available and Suction available Patient Re-evaluated:Patient Re-evaluated prior to induction Oxygen Delivery Method: Circle System Utilized Preoxygenation: Pre-oxygenation with 100% oxygen Induction Type: IV induction Ventilation: Mask ventilation without difficulty Laryngoscope Size: Miller and 2 Grade View: Grade I Tube type: Oral Tube size: 7.0 mm Number of attempts: 1 Airway Equipment and Method: Stylet Placement Confirmation: ETT inserted through vocal cords under direct vision, positive ETCO2 and breath sounds checked- equal and bilateral Secured at: 21 cm Tube secured with: Tape Dental Injury: Teeth and Oropharynx as per pre-operative assessment

## 2022-06-05 NOTE — Transfer of Care (Signed)
Immediate Anesthesia Transfer of Care Note  Patient: Anne [name]  Procedure(s) Performed: LAMINECTOMY AND FORAMINOTOMY - LUMBAR FOUR-LUMBAR FIVE - LUMBAR FIVE-SACRAL ONE - LEFT (Left: Back)  Patient Location: PACU  Anesthesia Type:General  Level of Consciousness: awake, alert , oriented, patient cooperative, and responds to stimulation  Airway & Oxygen Therapy: Patient Spontanous Breathing and Patient connected to nasal cannula oxygen  Post-op Assessment: Report given to RN, Post -op Vital signs reviewed and stable, and Patient moving all extremities X 4  Post vital signs: Reviewed and stable  Last Vitals:  Vitals Value Taken Time  BP 123/66 06/05/22 1000  Temp    Pulse 77 06/05/22 1000  Resp    SpO2 100 % 06/05/22 1000  Vitals shown include unvalidated device data.  Last Pain:  Vitals:   06/05/22 0615  TempSrc:   PainSc: 3       Patients Stated Pain Goal: 0 ([id number])  Complications: No notable events documented.

## 2022-06-05 NOTE — H&P (Signed)
Subjective: Patient is a 67 y.o. female admitted for L leg pain. Onset of symptoms was several months ago, gradually worsening since that time.  The pain is rated severe, and is located at the across the lower back and radiates to LLE. The pain is described as aching and occurs all day. The symptoms have been progressive. Symptoms are exacerbated by exercise, standing, and walking for more than a few minutes. MRI or CT showed stenosis L4-5 L5-S1   Past Medical History:  Diagnosis Date   Coronary artery disease    Crohn's colitis ([place])    Crohn's disease ([place])    GERD (gastroesophageal reflux disease)    High cholesterol    Hyperlipidemia    Hypertension    Hypokalemia 08/04/2019   STEMI (ST elevation myocardial infarction) ([place])    10/27/17 PCI/DES x1 to the dRCA, with residual thrombus in the PLA, normal EF    Past Surgical History:  Procedure Laterality Date   ANTERIOR CERVICAL DECOMP/DISCECTOMY FUSION N/A 01/02/2022   Procedure: Anterior Cervical Decompression Fusion - Cervical three-Cervical four - Cervical four-Cervical five - Cervical five-Cervical six;  Surgeon: Eustace Risk, MD;  Location: [facility];  Service: Neurosurgery;  Laterality: N/A;   BACK SURGERY  2014   done in [place]  09/22/2019   Procedure: BIOPSY;  Surgeon: Carol Ada, MD;  Location: [facility];  Service: Endoscopy;;   BIOPSY  05/29/2022   Procedure: BIOPSY;  Surgeon: Carol Ada, MD;  Location: [facility];  Service: Gastroenterology;;   CHOLECYSTECTOMY  2005   COLONOSCOPY WITH PROPOFOL N/A 09/22/2019   Procedure: COLONOSCOPY WITH PROPOFOL;  Surgeon: Carol Ada, MD;  Location: [facility];  Service: Endoscopy;  Laterality: N/A;   COLONOSCOPY WITH PROPOFOL N/A 05/29/2022   Procedure: COLONOSCOPY WITH PROPOFOL;  Surgeon: Carol Ada, MD;  Location: [facility];  Service: Gastroenterology;  Laterality: N/A;   CORONARY/GRAFT ACUTE MI REVASCULARIZATION N/A 10/26/2017   Procedure:  Coronary/Graft Acute MI Revascularization;  Surgeon: Troy Sine, MD;  Location: [facility];  Service: Cardiovascular;  Laterality: N/A;   FLEXIBLE SIGMOIDOSCOPY N/A 12/12/2021   Procedure: FLEXIBLE SIGMOIDOSCOPY;  Surgeon: Carol Ada, MD;  Location: [facility];  Service: Gastroenterology;  Laterality: N/A;   LEFT HEART CATH AND CORONARY ANGIOGRAPHY N/A 10/26/2017   Procedure: LEFT HEART CATH AND CORONARY ANGIOGRAPHY;  Surgeon: Troy Sine, MD;  Location: [facility];  Service: Cardiovascular;  Laterality: N/A;   LEFT HEART CATH AND CORONARY ANGIOGRAPHY Left 05/05/2018   Procedure: Left heart cath and coronary angiography;  Surgeon: Dionisio Wandra Babin, MD;  Location: [facility];  Service: Cardiovascular;  Laterality: Left;   MUSCLE BIOPSY Left 08/10/2019   Procedure: THIGH MUSCLE BIOPSY;  Surgeon: Coralie Keens, MD;  Location: [facility];  Service: General;  Laterality: Left;   SMALL INTESTINE SURGERY     TONSILLECTOMY     removed as a child    Prior to Admission medications   Medication Sig Start Date End Date Taking? Authorizing Provider  acetaminophen-codeine (TYLENOL #4) 300-60 MG tablet Take 1 tablet by mouth every 6 (six) hours as needed for moderate pain. 05/22/22 06/21/22 Yes Jodi Marble, MD  cholestyramine (QUESTRAN) 4 g packet Take 1 packet (4 g total) by mouth 2 (two) times daily. 05/22/22 08/20/22 Yes Jodi Marble, MD  cyclobenzaprine (FLEXERIL) 10 MG tablet Take 10 mg by mouth in the morning and at bedtime. 08/09/19  Yes [provider]  ezetimibe (ZETIA) 10 MG  tablet Take 1 tablet (10 mg total) by mouth at bedtime. 05/22/22 08/20/22 Yes Jodi Marble, MD  gabapentin (NEURONTIN) 400 MG capsule Take 1 capsule (400 mg total) by mouth 3 (three) times daily. Patient taking differently: Take 400 mg by mouth 2 (two) times daily. 05/22/22 08/20/22 Yes Jodi Marble, MD  Multiple Vitamin (MULTIVITAMIN WITH MINERALS) TABS tablet Take 1 tablet  by mouth in the morning.   Yes [provider]  rosuvastatin (CRESTOR) 40 MG tablet Take 1 tablet (40 mg total) by mouth daily. 05/22/22 08/20/22 Yes Jodi Marble, MD  aspirin EC 81 MG tablet Take 1 tablet (81 mg total) by mouth daily. Swallow whole. 12/31/21   Lelon Perla, MD  INFLIXIMAB IV Inject 1 Dose into the vein every 8 (eight) weeks.    [provider]  midodrine (PROAMATINE) 5 MG tablet Take 1 tablet (5 mg total) by mouth daily as needed (Take if blood pressure is low). 05/22/22 08/20/22  Jodi Marble, MD  nitroGLYCERIN (NITROSTAT) 0.4 MG SL tablet Place 1 tablet (0.4 mg total) under the tongue every 5 (five) minutes x 3 doses as needed for chest pain. 10/28/17   Reino Bellis B, NP  sodium bicarbonate 650 MG tablet Take 3 tablets (1,950 mg total) by mouth 2 (two) times daily. 05/22/22 08/20/22  Jodi Marble, MD   No Known Allergies  Social History   Tobacco Use   Smoking status: Former    Packs/day: 0.25    Years: 45.00    Total pack years: 11.25    Types: Cigarettes    Quit date: 11/18/2021    Years since quitting: 0.5   Smokeless tobacco: Never  Substance Use Topics   Alcohol use: No    Family History  Problem Relation Age of Onset   Stroke Mother    Crohn's disease Neg Hx      Review of Systems  Positive ROS: neg  All other systems have been reviewed and were otherwise negative with the exception of those mentioned in the HPI and as above.  Objective: Vital signs in last 24 hours: Temp:  [98 F (36.7 C)] 98 F (36.7 C) (02/23 0548) Pulse Rate:  [70] 70 (02/23 0548) Resp:  [18] 18 (02/23 0548) BP: (122)/(75) 122/75 (02/23 0548) SpO2:  [98 %] 98 % (02/23 0548) Weight:  [71.2 kg] 71.2 kg (02/23 0548)  General Appearance: Alert, cooperative, no distress, appears stated age Head: Normocephalic, without obvious abnormality, atraumatic Eyes: PERRL, conjunctiva/corneas clear, EOM's intact    Neck: Supple, symmetrical, trachea  midline Back: Symmetric, no curvature, ROM normal, no CVA tenderness Lungs:  respirations unlabored Heart: Regular rate and rhythm Abdomen: Soft, non-tender Extremities: Extremities normal, atraumatic, no cyanosis or edema Pulses: 2+ and symmetric all extremities Skin: Skin color, texture, turgor normal, no rashes or lesions  NEUROLOGIC:   Mental status: Alert and oriented x4,  no aphasia, good attention span, fund of knowledge, and memory Motor Exam - grossly normal Sensory Exam - grossly normal Reflexes: 1+ Coordination - grossly normal Gait - grossly normal Balance - grossly normal Cranial Nerves: I: smell Not tested  II: visual acuity  OS: nl    OD: nl  II: visual fields Full to confrontation  II: pupils Equal, round, reactive to light  III,VII: ptosis None  III,IV,VI: extraocular muscles  Full ROM  V: mastication Normal  V: facial light touch sensation  Normal  V,VII: corneal reflex  Present  VII: facial muscle function - upper  Normal  VII: facial muscle function - lower Normal  VIII: hearing Not tested  IX: soft palate elevation  Normal  IX,X: gag reflex Present  XI: trapezius strength  5/5  XI: sternocleidomastoid strength 5/5  XI: neck flexion strength  5/5  XII: tongue strength  Normal    Data Review Lab Results  Component Value Date   WBC 6.4 06/01/2022   HGB 13.1 06/01/2022   HCT 39.2 06/01/2022   MCV 91.0 06/01/2022   PLT 394 06/01/2022   Lab Results  Component Value Date   NA 139 06/01/2022   K 4.1 06/01/2022   CL 104 06/01/2022   CO2 23 06/01/2022   BUN 13 06/01/2022   CREATININE 1.24 (H) 06/01/2022   GLUCOSE 94 06/01/2022   Lab Results  Component Value Date   INR 1.0 06/01/2022    Assessment/Plan:  Estimated body mass index is 23.18 kg/m as calculated from the following:   Height as of this encounter: '5\' 9"'$  (1.753 m).   Weight as of this encounter: 71.2 kg. Patient admitted for L L4-5  L5-S1 hemilaminectomy. Patient has failed a  reasonable attempt at conservative therapy.  I explained the condition and procedure to the patient and answered any questions.  Patient wishes to proceed with procedure as planned. Understands risks/ benefits and typical outcomes of procedure.   Eustace Finerty 06/05/2022 7:16 AM

## 2022-06-06 ENCOUNTER — Encounter (HOSPITAL_COMMUNITY): Payer: Self-pay | Admitting: Neurological Surgery

## 2022-06-06 DIAGNOSIS — M48061 Spinal stenosis, lumbar region without neurogenic claudication: Secondary | ICD-10-CM | POA: Diagnosis not present

## 2022-06-06 DIAGNOSIS — M5417 Radiculopathy, lumbosacral region: Secondary | ICD-10-CM | POA: Diagnosis not present

## 2022-06-06 DIAGNOSIS — Z87891 Personal history of nicotine dependence: Secondary | ICD-10-CM | POA: Diagnosis not present

## 2022-06-06 DIAGNOSIS — Z79899 Other long term (current) drug therapy: Secondary | ICD-10-CM | POA: Diagnosis not present

## 2022-06-06 DIAGNOSIS — I2510 Atherosclerotic heart disease of native coronary artery without angina pectoris: Secondary | ICD-10-CM | POA: Diagnosis not present

## 2022-06-06 DIAGNOSIS — I10 Essential (primary) hypertension: Secondary | ICD-10-CM | POA: Diagnosis not present

## 2022-06-06 MED ORDER — CELECOXIB 200 MG PO CAPS: 200.0000 mg | ORAL_CAPSULE | Freq: Two times a day (BID) | ORAL | 0 refills | Status: DC

## 2022-06-06 MED ORDER — OXYCODONE HCL 5 MG PO TABS: 5.0000 mg | ORAL_TABLET | Freq: Four times a day (QID) | ORAL | 0 refills | Status: DC | PRN

## 2022-06-06 NOTE — Progress Notes (Signed)
Patient is discharged from room 3C11 at this time. Alert and in stable condition. IV site d/c'd and instructions read to patient with understanding verbalized and all questions answered. Left unit via wheelchair with all belongings at side. 

## 2022-06-06 NOTE — Discharge Summary (Signed)
Physician Discharge Summary     Providing Compassionate, Quality Care - Together   Patient ID: Anne [name] MRN: [id number] DOB/AGE: 1955/10/14 67 y.o.  Admit date: 06/05/2022 Discharge date: 06/06/2022  Admission Diagnoses:  Discharge Diagnoses:  Principal Problem:   S/P lumbar laminectomy   Discharged Condition: good  Hospital Course: Patient underwent left-sided L4-5, L5-S1 decompression with left L5-S1 microdiscectomy by Dr. Ronnald Ramp on 06/05/2022. She was admitted to [facility] following recovery from anesthesia in the PACU. Her postoperative course has been uncomplicated. She has worked with both physical and occupational therapies who feel the patient is ready for discharge home. She is ambulating independently and without difficulty. She is tolerating a normal diet. She is not having any bowel or bladder dysfunction. Her pain is well-controlled with oral pain medication. The pain in her left lower extremity is much improved since surgery. She is ready for discharge home.   Consults: PT/OT  Significant Diagnostic Studies: radiology: DG Lumbar Spine 1 View  Result Date: 06/05/2022 CLINICAL DATA:  Intraoperative laminectomy. EXAM: Portable LUMBAR SPINE - 1 VIEW COMPARISON:  10/02/2021 FINDINGS: Portable lateral view of the lumbar spine demonstrates a probe localizing the L4/L5 disc space. IMPRESSION: Intraoperative image obtained as described. Electronically Signed   By: Sammie Bench M.D.   On: 06/05/2022 10:32     Treatments: surgery: Left L4-5 L5-S1 hemilaminectomy medial facetectomy and foraminotomy with left L5-S1 microdiscectomy utilizing microscopic dissection   Discharge Exam: Blood pressure 91/74, pulse 78, temperature 98.2 F (36.8 C), temperature source Oral, resp. rate 20, height '5\' 9"'$  (1.753 m), weight 71.2 kg, last menstrual period 02/18/2001, SpO2 97 %.  Alert and oriented x 4 PERRLA CN II-XII grossly intact MAE, Strength and sensation intact Incision is covered  with Honeycomb dressing and Steri Strips; Dressing is clean, dry, and intact   Disposition: Discharge disposition: 01-Home or Self Care       Discharge Instructions     Call MD for:  difficulty breathing, headache or visual disturbances   Complete by: As directed    Call MD for:  hives   Complete by: As directed    Call MD for:  persistant nausea and vomiting   Complete by: As directed    Call MD for:  redness, tenderness, or signs of infection (pain, swelling, redness, odor or green/yellow discharge around incision site)   Complete by: As directed    Call MD for:  severe uncontrolled pain   Complete by: As directed    Call MD for:  temperature >100.4   Complete by: As directed    Diet - low sodium heart healthy   Complete by: As directed    If the dressing is still on your incision site when you go home, remove it on the third day after your surgery date. Remove dressing if it begins to fall off, or if it is dirty or damaged before the third day.   Complete by: As directed    Increase activity slowly   Complete by: As directed       Allergies as of 06/06/2022   No Known Allergies      Medication List     STOP taking these medications    acetaminophen-codeine 300-60 MG tablet Commonly known as: TYLENOL #4       TAKE these medications    aspirin EC 81 MG tablet Take 1 tablet (81 mg total) by mouth daily. Swallow whole.   celecoxib 200 MG capsule Commonly known as: CELEBREX Take 1 capsule (  200 mg total) by mouth every 12 (twelve) hours.   cholestyramine 4 g packet Commonly known as: QUESTRAN Take 1 packet (4 g total) by mouth 2 (two) times daily.   cyclobenzaprine 10 MG tablet Commonly known as: FLEXERIL Take 10 mg by mouth in the morning and at bedtime.   ezetimibe 10 MG tablet Commonly known as: ZETIA Take 1 tablet (10 mg total) by mouth at bedtime.   gabapentin 400 MG capsule Commonly known as: NEURONTIN Take 1 capsule (400 mg total) by mouth 3  (three) times daily. What changed: when to take this   INFLIXIMAB IV Inject 1 Dose into the vein every 8 (eight) weeks.   midodrine 5 MG tablet Commonly known as: PROAMATINE Take 1 tablet (5 mg total) by mouth daily as needed (Take if blood pressure is low).   multivitamin with minerals Tabs tablet Take 1 tablet by mouth in the morning.   nitroGLYCERIN 0.4 MG SL tablet Commonly known as: NITROSTAT Place 1 tablet (0.4 mg total) under the tongue every 5 (five) minutes x 3 doses as needed for chest pain.   oxyCODONE 5 MG immediate release tablet Commonly known as: Roxicodone Take 1-2 tablets (5-10 mg total) by mouth every 6 (six) hours as needed for severe pain (Postoperative pain).   rosuvastatin 40 MG tablet Commonly known as: CRESTOR Take 1 tablet (40 mg total) by mouth daily.   sodium bicarbonate 650 MG tablet Take 3 tablets (1,950 mg total) by mouth 2 (two) times daily.               Discharge Care Instructions  (From admission, onward)           Start     Ordered   06/06/22 0000  If the dressing is still on your incision site when you go home, remove it on the third day after your surgery date. Remove dressing if it begins to fall off, or if it is dirty or damaged before the third day.        06/06/22 [id number]            Follow-up Information     Eustace Brockbank, MD. Go on 06/18/2022.   Specialty: Neurosurgery Why: First post op appointment is on 06/18/2022 at 1:45 PM. Contact information: [address] [place] [address] [phone]                 Signed: Viona Gilmore, DNP, AGNP-C Nurse Practitioner  [facility] [place] [address], [place], [place] [address] P: [phone]    F: [phone]  06/06/2022, 9:20 AM

## 2022-06-06 NOTE — Care Management (Signed)
Patient with order to DC to home today. Unit staff to provide DME needed for home.   No HH needs identified  Patient will have family/ friends provide transportation home. No other TOC needs identified for DC

## 2022-06-06 NOTE — Discharge Instructions (Addendum)
Wound Care Keep incision covered and dry until post op day 3. You may remove the Honeycomb dressing on post op day 3. Leave steri-strips on back.  They will fall off by themselves. Do not put any creams, lotions, or ointments on incision. You are fine to shower. Let water run over incision and pat dry.  Activity Walk each and every day, increasing distance each day. No lifting greater than 5 lbs.  Avoid excessive back motion. No driving for 2 weeks; may ride as a passenger locally.  Diet Resume your normal diet.   Return to Work Will be discussed at your follow up appointment.  Call Your Doctor If Any of These Occur Redness, drainage, or swelling at the wound.  Temperature greater than 101 degrees. Severe pain not relieved by pain medication. Incision starts to come apart.  Follow Up Appt Call [phone] today for appointment in 2-3 weeks if you don't already have one or for any problems.

## 2022-06-22 ENCOUNTER — Other Ambulatory Visit: Payer: Self-pay | Admitting: Internal Medicine

## 2022-06-22 DIAGNOSIS — M48062 Spinal stenosis, lumbar region with neurogenic claudication: Secondary | ICD-10-CM

## 2022-06-25 NOTE — Progress Notes (Signed)
HPI: FU CAD.  In July 2019 she had an acute inferior myocardial infarction.  Cardiac catheterization revealed 95% proximal RCA with extensive thrombus formation.  Patient had PCI of the right coronary artery at that time. Follow-up catheterization January 2020 by Dr Humphrey Rolls showed a distal 30% stenosis but essentially normal coronary arteries by report.  Echocardiogram January 2023 showed normal LV function.  Nuclear study September 2023 showed no ischemia and normal perfusion and ejection fraction 46% but visually better.  Since last seen the patient denies any dyspnea on exertion, orthopnea, PND, pedal edema, palpitations, syncope or chest pain.   Current Outpatient Medications  Medication Sig Dispense Refill   acetaminophen-codeine (TYLENOL #4) 300-60 MG tablet Take 1 tablet by mouth in the morning, at noon, in the evening, and at bedtime.     aspirin EC 81 MG tablet Take 1 tablet (81 mg total) by mouth daily. Swallow whole. 90 tablet 3   celecoxib (CELEBREX) 200 MG capsule Take 1 capsule (200 mg total) by mouth every 12 (twelve) hours. 20 capsule 0   cholestyramine (QUESTRAN) 4 g packet Take 1 packet (4 g total) by mouth 2 (two) times daily. 180 packet 0   cyclobenzaprine (FLEXERIL) 10 MG tablet TAKE 1 TABLET BY MOUTH TWICE DAILY 60 tablet 2   ezetimibe (ZETIA) 10 MG tablet Take 1 tablet (10 mg total) by mouth at bedtime. 90 tablet 0   gabapentin (NEURONTIN) 400 MG capsule Take 1 capsule (400 mg total) by mouth 3 (three) times daily. 270 capsule 0   INFLIXIMAB IV Inject 1 Dose into the vein every 8 (eight) weeks.     midodrine (PROAMATINE) 5 MG tablet Take 1 tablet (5 mg total) by mouth daily as needed (Take if blood pressure is low). 30 tablet 2   Multiple Vitamin (MULTIVITAMIN WITH MINERALS) TABS tablet Take 1 tablet by mouth in the morning.     nitroGLYCERIN (NITROSTAT) 0.4 MG SL tablet Place 1 tablet (0.4 mg total) under the tongue every 5 (five) minutes x 3 doses as needed for chest  pain. 25 tablet 2   oxyCODONE (ROXICODONE) 5 MG immediate release tablet Take 1-2 tablets (5-10 mg total) by mouth every 6 (six) hours as needed for severe pain (Postoperative pain). 30 tablet 0   rosuvastatin (CRESTOR) 40 MG tablet Take 1 tablet (40 mg total) by mouth daily. 90 tablet 0   sodium bicarbonate 650 MG tablet Take 3 tablets (1,950 mg total) by mouth 2 (two) times daily. 180 tablet 2   No current facility-administered medications for this visit.     Past Medical History:  Diagnosis Date   Coronary artery disease    Crohn's colitis ([place])    Crohn's disease ([place])    GERD (gastroesophageal reflux disease)    High cholesterol    Hyperlipidemia    Hypertension    Hypokalemia 08/04/2019   STEMI (ST elevation myocardial infarction) ([place])    10/27/17 PCI/DES x1 to the dRCA, with residual thrombus in the PLA, normal EF    Past Surgical History:  Procedure Laterality Date   ANTERIOR CERVICAL DECOMP/DISCECTOMY FUSION N/A 01/02/2022   Procedure: Anterior Cervical Decompression Fusion - Cervical three-Cervical four - Cervical four-Cervical five - Cervical five-Cervical six;  Surgeon: Eustace Waszak, MD;  Location: [place];  Service: Neurosurgery;  Laterality: N/A;   BACK SURGERY  2014   done in [place]  09/22/2019   Procedure: BIOPSY;  Surgeon: Carol Ada, MD;  [facility];  Service: Endoscopy;;   BIOPSY  05/29/2022   Procedure: BIOPSY;  Surgeon: Carol Ada, MD;  [facility];  Service: Gastroenterology;;   CHOLECYSTECTOMY  2005   COLONOSCOPY WITH PROPOFOL N/A 09/22/2019   Procedure: COLONOSCOPY WITH PROPOFOL;  Surgeon: Carol Ada, MD;  [facility];  Service: Endoscopy;  Laterality: N/A;   COLONOSCOPY WITH PROPOFOL N/A 05/29/2022   Procedure: COLONOSCOPY WITH PROPOFOL;  Surgeon: Carol Ada, MD;  [facility];  Service: Gastroenterology;  Laterality: N/A;   CORONARY/GRAFT ACUTE MI REVASCULARIZATION N/A 10/26/2017    Procedure: Coronary/Graft Acute MI Revascularization;  Surgeon: Troy Sine, MD;  Location: [facility];  Service: Cardiovascular;  Laterality: N/A;   FLEXIBLE SIGMOIDOSCOPY N/A 12/12/2021   Procedure: FLEXIBLE SIGMOIDOSCOPY;  Surgeon: Carol Ada, MD;  [facility];  Service: Gastroenterology;  Laterality: N/A;   LEFT HEART CATH AND CORONARY ANGIOGRAPHY N/A 10/26/2017   Procedure: LEFT HEART CATH AND CORONARY ANGIOGRAPHY;  Surgeon: Troy Sine, MD;  Location: [facility];  Service: Cardiovascular;  Laterality: N/A;   LEFT HEART CATH AND CORONARY ANGIOGRAPHY Left 05/05/2018   Procedure: Left heart cath and coronary angiography;  Surgeon: Dionisio David, MD;  Location: [facility];  Service: Cardiovascular;  Laterality: Left;   LUMBAR LAMINECTOMY/DECOMPRESSION MICRODISCECTOMY Left 06/05/2022   Procedure: LAMINECTOMY AND FORAMINOTOMY - LUMBAR FOUR-LUMBAR FIVE - LUMBAR FIVE-SACRAL ONE - LEFT;  Surgeon: Eustace Stahle, MD;  Location: [facility];  Service: Neurosurgery;  Laterality: Left;  3C   MUSCLE BIOPSY Left 08/10/2019   Procedure: THIGH MUSCLE BIOPSY;  Surgeon: Coralie Keens, MD;  Location: [facility];  Service: General;  Laterality: Left;   SMALL INTESTINE SURGERY     TONSILLECTOMY     removed as a child    Social History   Socioeconomic History   Marital status: Single    Spouse name: Not on file   Number of children: 1   Years of education: Not on file   Highest education level: Not on file  Occupational History   Not on file  Tobacco Use   Smoking status: Former    Packs/day: 0.25    Years: 45.00    Additional pack years: 0.00    Total pack years: 11.25    Types: Cigarettes    Quit date: 11/18/2021    Years since quitting: 0.6   Smokeless tobacco: Never  Vaping Use   Vaping Use: Never used  Substance and Sexual Activity   Alcohol use: No   Drug use: No   Sexual activity: Not on file  Other Topics Concern   Not on file  Social History  Narrative   Not on file   Social Determinants of Health   Financial Resource Strain: Not on file  Food Insecurity: No Food Insecurity (06/06/2022)   Hunger Vital Sign    Worried About Running Out of Food in the Last Year: Never true    Ran Out of Food in the Last Year: Never true  Transportation Needs: No Transportation Needs (06/06/2022)   PRAPARE - Hydrologist (Medical): No    Lack of Transportation (Non-Medical): No  Physical Activity: Not on file  Stress: Not on file  Social Connections: Not on file  Intimate Partner Violence: Not At Risk (06/06/2022)   Humiliation, Afraid, Rape, and Kick questionnaire    Fear of Current or Ex-Partner: No    Emotionally Abused: No    Physically Abused: No  Sexually Abused: No    Family History  Problem Relation Age of Onset   Stroke Mother    Crohn's disease Neg Hx     ROS: no fevers or chills, productive cough, hemoptysis, dysphasia, odynophagia, melena, hematochezia, dysuria, hematuria, rash, seizure activity, orthopnea, PND, pedal edema, claudication. Remaining systems are negative.  Physical Exam: Well-developed well-nourished in no acute distress.  Skin is warm and dry.  HEENT is normal.  Neck is supple.  Chest is clear to auscultation with normal expansion.  Cardiovascular exam is regular rate and rhythm.  Abdominal exam nontender or distended. No masses palpated. Extremities show no edema. neuro grossly intact  A/P  1 coronary artery disease-patient denies chest pain.  Plan to continue medical therapy with aspirin and statin.  Most recent nuclear study showed no ischemia and she is not having chest pain.  2 hyperlipidemia-continue Crestor and Zetia.  3 history of tobacco abuse-patient counseled on discontinuing.  4 history of hypotension-ACE inhibitor discontinued previously and her blood pressure has improved.  Discontinue midodrine.  Kirk Ruths, MD

## 2022-06-26 ENCOUNTER — Other Ambulatory Visit: Payer: Self-pay | Admitting: Internal Medicine

## 2022-06-26 DIAGNOSIS — M48062 Spinal stenosis, lumbar region with neurogenic claudication: Secondary | ICD-10-CM

## 2022-06-29 ENCOUNTER — Other Ambulatory Visit: Payer: Self-pay

## 2022-07-01 ENCOUNTER — Ambulatory Visit (INDEPENDENT_AMBULATORY_CARE_PROVIDER_SITE_OTHER): Payer: Medicare Other | Admitting: Cardiology

## 2022-07-01 ENCOUNTER — Encounter: Payer: Self-pay | Admitting: Cardiology

## 2022-07-01 VITALS — BP 100/71 | HR 94 | Ht 69.0 in | Wt 160.0 lb

## 2022-07-01 DIAGNOSIS — I10 Essential (primary) hypertension: Secondary | ICD-10-CM

## 2022-07-01 DIAGNOSIS — I2510 Atherosclerotic heart disease of native coronary artery without angina pectoris: Secondary | ICD-10-CM | POA: Diagnosis not present

## 2022-07-01 DIAGNOSIS — E782 Mixed hyperlipidemia: Secondary | ICD-10-CM

## 2022-07-01 NOTE — Patient Instructions (Signed)
    Follow-Up: At [facility], you and your health needs are our priority.  As part of our continuing mission to provide you with exceptional heart care, we have created designated Provider Care Teams.  These Care Teams include your primary Cardiologist (physician) and Advanced Practice Providers (APPs -  Physician Assistants and Nurse Practitioners) who all work together to provide you with the care you need, when you need it.  We recommend signing up for the patient portal called "MyChart".  Sign up information is provided on this After Visit Summary.  MyChart is used to connect with patients for Virtual Visits (Telemedicine).  Patients are able to view lab/test results, encounter notes, upcoming appointments, etc.  Non-urgent messages can be sent to your provider as well.   To learn more about what you can do with MyChart, go to [email or web address].    Your next appointment:   12 month(s)  Provider:   Brian Crenshaw, MD     

## 2022-07-08 DIAGNOSIS — K5090 Crohn's disease, unspecified, without complications: Secondary | ICD-10-CM | POA: Diagnosis not present

## 2022-07-09 DIAGNOSIS — M545 Low back pain, unspecified: Secondary | ICD-10-CM | POA: Diagnosis not present

## 2022-07-09 DIAGNOSIS — M549 Dorsalgia, unspecified: Secondary | ICD-10-CM | POA: Diagnosis not present

## 2022-07-09 DIAGNOSIS — M129 Arthropathy, unspecified: Secondary | ICD-10-CM | POA: Diagnosis not present

## 2022-07-09 DIAGNOSIS — Z79899 Other long term (current) drug therapy: Secondary | ICD-10-CM | POA: Diagnosis not present

## 2022-07-09 DIAGNOSIS — M542 Cervicalgia: Secondary | ICD-10-CM | POA: Diagnosis not present

## 2022-07-16 DIAGNOSIS — K5090 Crohn's disease, unspecified, without complications: Secondary | ICD-10-CM | POA: Diagnosis not present

## 2022-07-23 DIAGNOSIS — M545 Low back pain, unspecified: Secondary | ICD-10-CM | POA: Diagnosis not present

## 2022-07-23 DIAGNOSIS — Z79899 Other long term (current) drug therapy: Secondary | ICD-10-CM | POA: Diagnosis not present

## 2022-07-23 DIAGNOSIS — Z6822 Body mass index (BMI) 22.0-22.9, adult: Secondary | ICD-10-CM | POA: Diagnosis not present

## 2022-07-23 DIAGNOSIS — M542 Cervicalgia: Secondary | ICD-10-CM | POA: Diagnosis not present

## 2022-07-27 DIAGNOSIS — Z79899 Other long term (current) drug therapy: Secondary | ICD-10-CM | POA: Diagnosis not present

## 2022-08-14 DIAGNOSIS — R5383 Other fatigue: Secondary | ICD-10-CM | POA: Diagnosis not present

## 2022-08-14 DIAGNOSIS — E7800 Pure hypercholesterolemia, unspecified: Secondary | ICD-10-CM | POA: Diagnosis not present

## 2022-08-14 DIAGNOSIS — E559 Vitamin D deficiency, unspecified: Secondary | ICD-10-CM | POA: Diagnosis not present

## 2022-08-14 DIAGNOSIS — Z79899 Other long term (current) drug therapy: Secondary | ICD-10-CM | POA: Diagnosis not present

## 2022-08-14 DIAGNOSIS — Z131 Encounter for screening for diabetes mellitus: Secondary | ICD-10-CM | POA: Diagnosis not present

## 2022-08-14 DIAGNOSIS — M129 Arthropathy, unspecified: Secondary | ICD-10-CM | POA: Diagnosis not present

## 2022-08-18 DIAGNOSIS — M5412 Radiculopathy, cervical region: Secondary | ICD-10-CM | POA: Diagnosis not present

## 2022-08-20 DIAGNOSIS — M542 Cervicalgia: Secondary | ICD-10-CM | POA: Diagnosis not present

## 2022-08-20 DIAGNOSIS — Z6822 Body mass index (BMI) 22.0-22.9, adult: Secondary | ICD-10-CM | POA: Diagnosis not present

## 2022-08-20 DIAGNOSIS — M545 Low back pain, unspecified: Secondary | ICD-10-CM | POA: Diagnosis not present

## 2022-08-20 DIAGNOSIS — Z79899 Other long term (current) drug therapy: Secondary | ICD-10-CM | POA: Diagnosis not present

## 2022-08-21 ENCOUNTER — Ambulatory Visit: Payer: BC Managed Care – PPO | Admitting: Internal Medicine

## 2022-08-25 DIAGNOSIS — Z79899 Other long term (current) drug therapy: Secondary | ICD-10-CM | POA: Diagnosis not present

## 2022-08-28 ENCOUNTER — Other Ambulatory Visit: Payer: Self-pay | Admitting: Internal Medicine

## 2022-08-28 DIAGNOSIS — I2510 Atherosclerotic heart disease of native coronary artery without angina pectoris: Secondary | ICD-10-CM

## 2022-09-10 DIAGNOSIS — K5090 Crohn's disease, unspecified, without complications: Secondary | ICD-10-CM | POA: Diagnosis not present

## 2022-09-17 DIAGNOSIS — M542 Cervicalgia: Secondary | ICD-10-CM | POA: Diagnosis not present

## 2022-09-17 DIAGNOSIS — Z79899 Other long term (current) drug therapy: Secondary | ICD-10-CM | POA: Diagnosis not present

## 2022-09-17 DIAGNOSIS — Z6822 Body mass index (BMI) 22.0-22.9, adult: Secondary | ICD-10-CM | POA: Diagnosis not present

## 2022-09-17 DIAGNOSIS — M545 Low back pain, unspecified: Secondary | ICD-10-CM | POA: Diagnosis not present

## 2022-10-01 ENCOUNTER — Other Ambulatory Visit: Payer: Self-pay | Admitting: Internal Medicine

## 2022-10-01 DIAGNOSIS — I2510 Atherosclerotic heart disease of native coronary artery without angina pectoris: Secondary | ICD-10-CM | POA: Diagnosis not present

## 2022-10-01 DIAGNOSIS — N183 Chronic kidney disease, stage 3 unspecified: Secondary | ICD-10-CM | POA: Diagnosis not present

## 2022-10-01 DIAGNOSIS — K5090 Crohn's disease, unspecified, without complications: Secondary | ICD-10-CM | POA: Diagnosis not present

## 2022-10-02 ENCOUNTER — Encounter: Payer: Self-pay | Admitting: Internal Medicine

## 2022-10-09 ENCOUNTER — Ambulatory Visit (INDEPENDENT_AMBULATORY_CARE_PROVIDER_SITE_OTHER): Payer: Medicare Other | Admitting: Internal Medicine

## 2022-10-09 VITALS — BP 130/70 | HR 73 | Ht 69.0 in | Wt 155.6 lb

## 2022-10-09 DIAGNOSIS — I2510 Atherosclerotic heart disease of native coronary artery without angina pectoris: Secondary | ICD-10-CM | POA: Diagnosis not present

## 2022-10-09 DIAGNOSIS — N1832 Chronic kidney disease, stage 3b: Secondary | ICD-10-CM

## 2022-10-09 DIAGNOSIS — R7989 Other specified abnormal findings of blood chemistry: Secondary | ICD-10-CM

## 2022-10-09 DIAGNOSIS — M48062 Spinal stenosis, lumbar region with neurogenic claudication: Secondary | ICD-10-CM

## 2022-10-09 MED ORDER — GABAPENTIN 400 MG PO CAPS: 400.0000 mg | ORAL_CAPSULE | Freq: Three times a day (TID) | ORAL | 0 refills | Status: DC

## 2022-10-09 MED ORDER — ROSUVASTATIN CALCIUM 40 MG PO TABS: 40.0000 mg | ORAL_TABLET | Freq: Every day | ORAL | 0 refills | Status: DC

## 2022-10-09 MED ORDER — CHOLESTYRAMINE 4 G PO PACK: 4.0000 g | PACK | Freq: Two times a day (BID) | ORAL | 0 refills | Status: DC

## 2022-10-09 MED ORDER — EZETIMIBE 10 MG PO TABS: ORAL_TABLET | ORAL | 0 refills | Status: DC

## 2022-10-09 NOTE — Progress Notes (Signed)
Established Patient Office Visit  Subjective:  Patient ID: Anne [name], female    DOB: 22-Oct-1955  Age: 67 y.o. MRN: [id number]  Chief Complaint  Patient presents with   Follow-up    3 mo F/U     No new complaints, here for lab review and medication refills. Chronic pain managed by pain management.    No other concerns at this time.   Past Medical History:  Diagnosis Date   Coronary artery disease    Crohn'[name] colitis ([facility])    Crohn'[name] disease (H[facility])    GERD (gastroesophageal reflux disease)    High cholesterol    Hyperlipidemia    Hypertension    Hypokalemia 08/04/2019   STEMI (ST elevation myocardial infarction) ([facility])    10/27/17 PCI/DES x1 to the dRCA, with residual thrombus in the PLA, normal EF    Past Surgical History:  Procedure Laterality Date   ANTERIOR CERVICAL DECOMP/DISCECTOMY FUSION N/A 01/02/2022   Procedure: Anterior Cervical Decompression Fusion - Cervical three-Cervical four - Cervical four-Cervical five - Cervical five-Cervical six;  Surgeon: Anne Alert, MD;  Location: [facility];  Service: Neurosurgery;  Laterality: N/A;   BACK SURGERY  2014   done in [place]   BIOPSY  09/22/2019   Procedure: BIOPSY;  Surgeon: Anne Hawking, MD;  [facility];  Service: Endoscopy;;   BIOPSY  05/29/2022   Procedure: BIOPSY;  Surgeon: Anne Hawking, MD;  [facility];  Service: Gastroenterology;;   CHOLECYSTECTOMY  2005   COLONOSCOPY WITH PROPOFOL N/A 09/22/2019   Procedure: COLONOSCOPY WITH PROPOFOL;  Surgeon: Anne Hawking, MD;  [facility];  Service: Endoscopy;  Laterality: N/A;   COLONOSCOPY WITH PROPOFOL N/A 05/29/2022   Procedure: COLONOSCOPY WITH PROPOFOL;  Surgeon: Anne Hawking, MD;  [facility];  Service: Gastroenterology;  Laterality: N/A;   CORONARY/GRAFT ACUTE MI REVASCULARIZATION N/A 10/26/2017   Procedure: Coronary/Graft Acute MI Revascularization;  Surgeon: Anne Bihari, MD;  Location: [facility] INVASIVE CV LAB;   Service: Cardiovascular;  Laterality: N/A;   FLEXIBLE SIGMOIDOSCOPY N/A 12/12/2021   Procedure: FLEXIBLE SIGMOIDOSCOPY;  Surgeon: Anne Hawking, MD;  [facility];  Service: Gastroenterology;  Laterality: N/A;   LEFT HEART CATH AND CORONARY ANGIOGRAPHY N/A 10/26/2017   Procedure: LEFT HEART CATH AND CORONARY ANGIOGRAPHY;  Surgeon: Anne Bihari, MD;  [facility];  Service: Cardiovascular;  Laterality: N/A;   LEFT HEART CATH AND CORONARY ANGIOGRAPHY Left 05/05/2018   Procedure: Left heart cath and coronary angiography;  Surgeon: Anne Nancy, MD;  [facility] INVASIVE CV LAB;  Service: Cardiovascular;  Laterality: Left;   LUMBAR LAMINECTOMY/DECOMPRESSION MICRODISCECTOMY Left 06/05/2022   Procedure: LAMINECTOMY AND FORAMINOTOMY - LUMBAR FOUR-LUMBAR FIVE - LUMBAR FIVE-SACRAL ONE - LEFT;  Surgeon: Anne Alert, MD;  Location: [name];  Service: Neurosurgery;  Laterality: Left;  3C   MUSCLE BIOPSY Left 08/10/2019   Procedure: THIGH MUSCLE BIOPSY;  Surgeon: Anne Miyamoto, MD;  [facility];  Service: General;  Laterality: Left;   SMALL INTESTINE SURGERY     TONSILLECTOMY     removed as a child    Social History   Socioeconomic History   Marital status: Single    Spouse name: Not on file   Number of children: 1   Years of education: Not on file   Highest education level: Not on file  Occupational History   Not on file  Tobacco Use   Smoking status: Former    Packs/day: 0.25  Years: 45.00    Additional pack years: 0.00    Total pack years: 11.25    Types: Cigarettes    Quit date: 11/18/2021    Years since quitting: 0.8   Smokeless tobacco: Never  Vaping Use   Vaping Use: Never used  Substance and Sexual Activity   Alcohol use: No   Drug use: No   Sexual activity: Not on file  Other Topics Concern   Not on file  Social History Narrative   Not on file   Social Determinants of Health   Financial Resource Strain: Not on file  Food  Insecurity: No Food Insecurity (06/06/2022)   Hunger Vital Sign    Worried About Running Out of Food in the Last Year: Never true    Ran Out of Food in the Last Year: Never true  Transportation Needs: No Transportation Needs (06/06/2022)   PRAPARE - Administrator, Civil Service (Medical): No    Lack of Transportation (Non-Medical): No  Physical Activity: Not on file  Stress: Not on file  Social Connections: Not on file  Intimate Partner Violence: Not At Risk (06/06/2022)   Humiliation, Afraid, Rape, and Kick questionnaire    Fear of Current or Ex-Partner: No    Emotionally Abused: No    Physically Abused: No    Sexually Abused: No    Family History  Problem Relation Age of Onset   Stroke Mother    Crohn'[name] disease Neg Hx     No Known Allergies  Review of Systems  Constitutional: Negative.   HENT: Negative.    Eyes: Negative.   Respiratory: Negative.    Cardiovascular: Negative.   Gastrointestinal: Negative.   Genitourinary: Negative.   Musculoskeletal:        As in hpi  Skin: Negative.   Neurological:  Positive for dizziness.  Endo/Heme/Allergies: Negative.        Objective:   BP 130/70   Pulse 73   Ht 5\' 9"  (1.753 m)   Wt 155 lb 9.6 oz (70.6 kg)   LMP 02/18/2001   SpO2 97%   BMI 22.98 kg/m   Vitals:   10/09/22 1415  BP: 130/70  Pulse: 73  Height: 5\' 9"  (1.753 m)  Weight: 155 lb 9.6 oz (70.6 kg)  SpO2: 97%  BMI (Calculated): 22.97    Physical Exam Vitals reviewed.  Constitutional:      General: She is not in acute distress. HENT:     Head: Normocephalic.     Nose: Nose normal.     Mouth/Throat:     Mouth: Mucous membranes are moist.  Eyes:     Extraocular Movements: Extraocular movements intact.     Pupils: Pupils are equal, round, and reactive to light.  Cardiovascular:     Rate and Rhythm: Normal rate and regular rhythm.     Heart sounds: No murmur heard. Pulmonary:     Effort: Pulmonary effort is normal.     Breath sounds:  No rhonchi or rales.  Abdominal:     General: Abdomen is flat.     Palpations: There is no hepatomegaly, splenomegaly or mass.  Musculoskeletal:        General: Normal range of motion.     Cervical back: Normal range of motion. No tenderness.  Skin:    General: Skin is warm and dry.  Neurological:     General: No focal deficit present.     Mental Status: She is [name] and oriented to person, place, and  time.     Cranial Nerves: No cranial nerve deficit.     Motor: No weakness.  Psychiatric:        Mood and Affect: Mood normal.        Behavior: Behavior normal.      No results found for any visits on 10/09/22.  No results found for this or any previous visit (from the past 2160 hour(Linet Brash)).    Assessment & Plan:   Problem List Items Addressed This Visit       Cardiovascular and Mediastinum   Coronary artery disease involving native coronary artery of native heart without angina pectoris   Relevant Medications   cholestyramine (QUESTRAN) 4 g packet   ezetimibe (ZETIA) 10 MG tablet   rosuvastatin (CRESTOR) 40 MG tablet     Genitourinary   Stage 3b chronic kidney disease (HCC) - Primary     Other   Abnormal LFTs   Relevant Orders   US Abdomen Limited RUQ (LIVER/GB)   Ferritin   Ceruloplasmin   Hepatitis C antibody   Hepatitis B surface antigen   Hepatitis A antibody, IgM   Spinal stenosis of lumbar region with neurogenic claudication   Relevant Medications   gabapentin (NEURONTIN) 400 MG capsule    Return in about 2 weeks (around 10/23/2022) for lab results, fu with labs prior.   Total time spent: 30 minutes  Luna Fuse, MD  10/09/2022   This document may have been prepared by [facility] Voice Recognition software and as such may include unintentional dictation errors.

## 2022-10-16 DIAGNOSIS — Z79899 Other long term (current) drug therapy: Secondary | ICD-10-CM | POA: Diagnosis not present

## 2022-10-16 DIAGNOSIS — Z6822 Body mass index (BMI) 22.0-22.9, adult: Secondary | ICD-10-CM | POA: Diagnosis not present

## 2022-10-16 DIAGNOSIS — M545 Low back pain, unspecified: Secondary | ICD-10-CM | POA: Diagnosis not present

## 2022-10-16 DIAGNOSIS — M542 Cervicalgia: Secondary | ICD-10-CM | POA: Diagnosis not present

## 2022-10-20 DIAGNOSIS — Z23 Encounter for immunization: Secondary | ICD-10-CM | POA: Diagnosis not present

## 2022-10-20 DIAGNOSIS — Z79899 Other long term (current) drug therapy: Secondary | ICD-10-CM | POA: Diagnosis not present

## 2022-10-21 ENCOUNTER — Ambulatory Visit (INDEPENDENT_AMBULATORY_CARE_PROVIDER_SITE_OTHER): Payer: Medicare Other

## 2022-10-21 DIAGNOSIS — R7989 Other specified abnormal findings of blood chemistry: Secondary | ICD-10-CM

## 2022-10-23 ENCOUNTER — Ambulatory Visit: Payer: Medicare Other | Admitting: Internal Medicine

## 2022-10-23 VITALS — BP 110/69 | HR 57 | Ht 69.0 in | Wt 160.4 lb

## 2022-10-23 DIAGNOSIS — R7989 Other specified abnormal findings of blood chemistry: Secondary | ICD-10-CM

## 2022-10-23 NOTE — Progress Notes (Signed)
Established Patient Office Visit  Subjective:  Patient ID: Anne [name], female    DOB: 22-Jul-1955  Age: 67 y.o. MRN: [id number]  Chief Complaint  Patient presents with   Follow-up    2 week F/U     No new complaints, here for lab review and medication refills. USS reviewed and only notable for right renal cyst. Failed to have liver panel tests done.    No other concerns at this time.   Past Medical History:  Diagnosis Date   Coronary artery disease    Crohn'Gerhardt Gleed colitis (HCC)    Crohn'Juron Vorhees disease (HCC)    GERD (gastroesophageal reflux disease)    High cholesterol    Hyperlipidemia    Hypertension    Hypokalemia 08/04/2019   STEMI (ST elevation myocardial infarction) ([facility])    10/27/17 PCI/DES x1 to the dRCA, with residual thrombus in the PLA, normal EF    Past Surgical History:  Procedure Laterality Date   ANTERIOR CERVICAL DECOMP/DISCECTOMY FUSION N/A 01/02/2022   Procedure: Anterior Cervical Decompression Fusion - Cervical three-Cervical four - Cervical four-Cervical five - Cervical five-Cervical six;  Surgeon: Tia Alert, MD;  Location: [facility] OR;  Service: Neurosurgery;  Laterality: N/A;   BACK SURGERY  2014   done in [place]   BIOPSY  09/22/2019   Procedure: BIOPSY;  Surgeon: [name], MD;  [facility];  Service: Endoscopy;;   BIOPSY  05/29/2022   Procedure: BIOPSY;  Surgeon: [name], MD;  [facility];  Service: Gastroenterology;;   CHOLECYSTECTOMY  2005   COLONOSCOPY WITH PROPOFOL N/A 09/22/2019   Procedure: COLONOSCOPY WITH PROPOFOL;  Surgeon: [name], MD;  [facility];  Service: Endoscopy;  Laterality: N/A;   COLONOSCOPY WITH PROPOFOL N/A 05/29/2022   Procedure: COLONOSCOPY WITH PROPOFOL;  Surgeon: [name], MD;  [facility];  Service: Gastroenterology;  Laterality: N/A;   CORONARY/GRAFT ACUTE MI REVASCULARIZATION N/A 10/26/2017   Procedure: Coronary/Graft Acute MI Revascularization;  Surgeon:  Lennette Bihari, MD;  Location[facility] INVAS[facility] CV LAB;  Service: Cardiovascular;  Laterality: N/A;   FLEXIBLE SIGMOIDOSCOPY N/A 12/12/2021   Procedure: FLEXIBLE SIGMOIDOSCOPY;  Surgeon: [name], MD;  [facility];  Service: Gastroenterology;  Laterality: N/A;   LEFT HEART CATH AND CORONARY ANGIOGRAPHY N/A 10/26/2017   Procedure: LEFT HEART CATH AND CORONARY ANGIOGRAPHY;  Surgeon: Lennette Bihari, MD;  Location: [facility];  Service: Cardiovascular;  Laterality: N/A;   LEFT HEART CATH AND CORONARY ANGIOGRAPHY Left 05/05/2018   Procedure: Left heart cath and coronary angiography;  Surgeon: Laurier Nancy, MD;  Location: [facility] INVASIVE CV LAB;  Service: Cardiovascular;  Laterality: Left;   LUMBAR LAMINECTOMY/DECOMPRESSION MICRODISCECTOMY Left 06/05/2022   Procedure: LAMINECTOMY AND FORAMINOTOMY - LUMBAR FOUR-LUMBAR FIVE - LUMBAR FIVE-SACRAL ONE - LEFT;  Surgeon: Tia Alert, MD;  Location: [facility] OR;  Service: Neurosurgery;  Laterality: Left;  3C   MUSCLE BIOPSY Left 08/10/2019   Procedure: THIGH MUSCLE BIOPSY;  Surgeon: Abigail Miyamoto, MD;  Location[facility];  Service: General;  Laterality: Left;   SMALL INTESTINE SURGERY     TONSILLECTOMY     removed as a child    Social History   Socioeconomic History   Marital status: Single    Spouse name: Not on file   Number of children: 1   Years of education: Not on file   Highest education level: Not on file  Occupational History   Not on file  Tobacco Use  Smoking status: Former    Current packs/day: 0.00    Average packs/day: 0.3 packs/day for 45.0 years (11.3 ttl pk-yrs)    Types: Cigarettes    Start date: 11/18/1976    Quit date: 11/18/2021    Years since quitting: 0.9   Smokeless tobacco: Never  Vaping Use   Vaping status: Never Used  Substance and Sexual Activity   Alcohol use: No   Drug use: No   Sexual activity: Not on file  Other Topics Concern   Not on file  Social History Narrative   Not on file    Social Determinants of Health   Financial Resource Strain: Not on file  Food Insecurity: No Food Insecurity (06/06/2022)   Hunger Vital Sign    Worried About Running Out of Food in the Last Year: Never true    Ran Out of Food in the Last Year: Never true  Transportation Needs: No Transportation Needs (06/06/2022)   PRAPARE - Administrator, Civil Service (Medical): No    Lack of Transportation (Non-Medical): No  Physical Activity: Not on file  Stress: Not on file  Social Connections: Unknown (04/07/2022)   Received from [facility]   Social Network    Social Network: Not on file  Intimate Partner Violence: Not At Risk (06/06/2022)   Humiliation, Afraid, Rape, and Kick questionnaire    Fear of Current or Ex-Partner: No    Emotionally Abused: No    Physically Abused: No    Sexually Abused: No    Family History  Problem Relation Age of Onset   Stroke Mother    Crohn'Crissy Mccreadie disease Neg Hx     No Known Allergies  Review of Systems  Constitutional: Negative.   HENT: Negative.    Eyes: Negative.   Respiratory: Negative.    Cardiovascular: Negative.   Gastrointestinal: Negative.   Genitourinary: Negative.   Musculoskeletal:        As in hpi  Skin: Negative.   Neurological:  Positive for dizziness.  Endo/Heme/Allergies: Negative.        Objective:   BP 110/69   Pulse (!) 57   Ht 5\' 9"  (1.753 m)   Wt 160 lb 6.4 oz (72.8 kg)   LMP 02/18/2001   SpO2 98%   BMI 23.69 kg/m   Vitals:   10/23/22 1355  BP: 110/69  Pulse: (!) 57  Height: 5\' 9"  (1.753 m)  Weight: 160 lb 6.4 oz (72.8 kg)  SpO2: 98%  BMI (Calculated): 23.68    Physical Exam Vitals reviewed.  Constitutional:      General: She is not in acute distress. HENT:     Head: Normocephalic.     Nose: Nose normal.     Mouth/Throat:     Mouth: Mucous membranes are moist.  Eyes:     Extraocular Movements: Extraocular movements intact.     Pupils: Pupils are equal, round, and reactive to light.   Cardiovascular:     Rate and Rhythm: Normal rate and regular rhythm.     Heart sounds: No murmur heard. Pulmonary:     Effort: Pulmonary effort is normal.     Breath sounds: No rhonchi or rales.  Abdominal:     General: Abdomen is flat.     Palpations: There is no hepatomegaly, splenomegaly or mass.  Musculoskeletal:        General: Normal range of motion.     Cervical back: Normal range of motion. No tenderness.  Skin:    General:  Skin is warm and dry.  Neurological:     General: No focal deficit present.     Mental Status: She is alert and oriented to person, place, and time.     Cranial Nerves: No cranial nerve deficit.     Motor: No weakness.  Psychiatric:        Mood and Affect: Mood normal.        Behavior: Behavior normal.      No results found for any visits on 10/23/22.  No results found for this or any previous visit (from the past 2160 hour(Searra Carnathan)).    Assessment & Plan:  As per problem list. Call pt with results.  Problem List Items Addressed This Visit       Other   Abnormal LFTs - Primary   Relevant Orders   Hepatic function panel    Return in about 10 weeks (around 01/01/2023) for fu with labs prior.   Total time spent: 20 minutes  Luna Fuse, MD  10/23/2022   This document may have been prepared by [facility] Voice Recognition software and as such may include unintentional dictation errors.

## 2022-10-28 ENCOUNTER — Other Ambulatory Visit: Payer: BC Managed Care – PPO

## 2022-11-05 DIAGNOSIS — K5090 Crohn's disease, unspecified, without complications: Secondary | ICD-10-CM | POA: Diagnosis not present

## 2022-11-16 DIAGNOSIS — Z79899 Other long term (current) drug therapy: Secondary | ICD-10-CM | POA: Diagnosis not present

## 2022-11-16 DIAGNOSIS — Z6822 Body mass index (BMI) 22.0-22.9, adult: Secondary | ICD-10-CM | POA: Diagnosis not present

## 2022-11-16 DIAGNOSIS — M545 Low back pain, unspecified: Secondary | ICD-10-CM | POA: Diagnosis not present

## 2022-11-16 DIAGNOSIS — M542 Cervicalgia: Secondary | ICD-10-CM | POA: Diagnosis not present

## 2022-11-19 DIAGNOSIS — Z79899 Other long term (current) drug therapy: Secondary | ICD-10-CM | POA: Diagnosis not present

## 2022-11-26 ENCOUNTER — Other Ambulatory Visit: Payer: Self-pay | Admitting: *Deleted

## 2022-11-26 ENCOUNTER — Ambulatory Visit: Payer: Medicare Other | Admitting: Student

## 2022-11-26 VITALS — BP 114/75 | HR 88 | Temp 97.8°F | Wt 161.8 lb

## 2022-11-26 DIAGNOSIS — F17210 Nicotine dependence, cigarettes, uncomplicated: Secondary | ICD-10-CM | POA: Diagnosis not present

## 2022-11-26 DIAGNOSIS — N183 Chronic kidney disease, stage 3 unspecified: Secondary | ICD-10-CM

## 2022-11-26 DIAGNOSIS — M549 Dorsalgia, unspecified: Secondary | ICD-10-CM | POA: Insufficient documentation

## 2022-11-26 DIAGNOSIS — R748 Abnormal levels of other serum enzymes: Secondary | ICD-10-CM | POA: Diagnosis not present

## 2022-11-26 DIAGNOSIS — K50119 Crohn's disease of large intestine with unspecified complications: Secondary | ICD-10-CM

## 2022-11-26 DIAGNOSIS — I9589 Other hypotension: Secondary | ICD-10-CM | POA: Diagnosis not present

## 2022-11-26 DIAGNOSIS — M5442 Lumbago with sciatica, left side: Secondary | ICD-10-CM | POA: Diagnosis not present

## 2022-11-26 DIAGNOSIS — Z0000 Encounter for general adult medical examination without abnormal findings: Secondary | ICD-10-CM

## 2022-11-26 DIAGNOSIS — Z716 Tobacco abuse counseling: Secondary | ICD-10-CM

## 2022-11-26 DIAGNOSIS — Z720 Tobacco use: Secondary | ICD-10-CM

## 2022-11-26 DIAGNOSIS — G8929 Other chronic pain: Secondary | ICD-10-CM

## 2022-11-26 NOTE — Progress Notes (Signed)
New Patient Office Visit  Subjective    Patient ID: Anne [name], female    DOB: May 30, 1955  Age: 67 y.o. MRN: [id number]  CC:  Chief Complaint  Patient presents with   Establish Care    Here to follow up on her "liver count"    Back Pain    Chronic (followed by pain management)   HPI Anne [name] presents to establish care. Her PMH includes CAD, Crohn's colitis, GERD, HLD, chronic back pain, tobacco use, right renal cyst, and CKD 3.   Patient's Crohn's is managed by Dr. [name] (GI). Patient receives Remicade/infliximab infusions every 2 months. Patient brought in current medications, endorses adherence.   Patient with chronic back pain, history of lumbar back surgery 2014, neck surgery 2023, and laminectomy 2024. She currently ambulates unassisted. Denies any falls. Reports back pain is managed by [facility] with Dr. Malen Gauze and takes oxycodone PRN. Has been feeling unsteady. Does not go to physical therapy but is open to it to help determine if she is in need of cane, rollator, or can benefit from at home exercises. Has gabapentin and celecoxib on medication list but only reports taking gabapentin 400 mg TID.   Patient with long history of cigarette smoking, began using tobacco in high school. Reports smoking up to 10 cigarettes a day, has been cutting back to about a pack of cigarettes a week. Denies smokeless tobacco use, vaping, TSH, marijuana, other illicit drugs or drinking alcohol. Interested in continuing to cut down on smoking on her own. Denies any SOB or cough today. Has never had low dose screening CT chest  For healthcare maintenance, patient would appreciate a dentistry referral today. Has an upcoming eye exam in October 2024 to assess cataracts, not currently driving. Patient with normal mammogram 2023. Gets regular colonoscopies with GI. Per patient, completed pneumovax last month and has received multiple doses of the COVID vaccine.    Outpatient Encounter Medications as of 11/26/2022  Medication Sig   aspirin EC 81 MG tablet Take 1 tablet (81 mg total) by mouth daily. Swallow whole.   cholestyramine (QUESTRAN) 4 g packet Take 1 packet (4 g total) by mouth 2 (two) times daily.   cyclobenzaprine (FLEXERIL) 10 MG tablet TAKE 1 TABLET BY MOUTH TWICE DAILY   ezetimibe (ZETIA) 10 MG tablet TAKE 1 TABLET(10 MG) BY MOUTH AT BEDTIME   gabapentin (NEURONTIN) 400 MG capsule Take 1 capsule (400 mg total) by mouth 3 (three) times daily.   INFLIXIMAB IV Inject 1 Dose into the vein every 8 (eight) weeks.   Multiple Vitamin (MULTIVITAMIN WITH MINERALS) TABS tablet Take 1 tablet by mouth in the morning.   nitroGLYCERIN (NITROSTAT) 0.4 MG SL tablet Place 1 tablet (0.4 mg total) under the tongue every 5 (five) minutes x 3 doses as needed for chest pain.   oxyCODONE (ROXICODONE) 5 MG immediate release tablet Take 1-2 tablets (5-10 mg total) by mouth every 6 (six) hours as needed for severe pain (Postoperative pain).   rosuvastatin (CRESTOR) 40 MG tablet Take 1 tablet (40 mg total) by mouth daily.   sodium bicarbonate 650 MG tablet TAKE 3 TABLETS(1950 MG) BY MOUTH TWICE DAILY   [DISCONTINUED] celecoxib (CELEBREX) 200 MG capsule Take 1 capsule (200 mg total) by mouth every 12 (twelve) hours.   No facility-administered encounter medications on file as of 11/26/2022.    Past Medical History:  Diagnosis Date   Coronary artery disease    Crohn's colitis (HCC)  Crohn's disease (HCC)    GERD (gastroesophageal reflux disease)    High cholesterol    Hyperlipidemia    Hypertension    Hypokalemia 08/04/2019   STEMI (ST elevation myocardial infarction) (HCC)    10/27/17 PCI/DES x1 to the dRCA, with residual thrombus in the PLA, normal EF    Past Surgical History:  Procedure Laterality Date   ANTERIOR CERVICAL DECOMP/DISCECTOMY FUSION N/A 01/02/2022   Procedure: Anterior Cervical Decompression Fusion - Cervical three-Cervical four - Cervical  four-Cervical five - Cervical five-Cervical six;  Surgeon: Tia Alert, MD;  Location: [facility] OR;  Service: Neurosurgery;  Laterality: N/A;   BACK SURGERY  2014   done in [place]   BIOPSY  09/22/2019   Procedure: BIOPSY;  Surgeon: [name], MD;  Location: [facility]COPY;  Service: Endoscopy;;   BIOPSY  05/29/2022   Procedure: BIOPSY;  Surgeon: [name], MD;  Location: [facility];  Service: Gastroenterology;;   CHOLECYSTECTOMY  2005   COLONOSCOPY WITH PROPOFOL N/A 09/22/2019   Procedure: COLONOSCOPY WITH PROPOFOL;  Surgeon: [name], MD;  Location: [facility];  Service: Endoscopy;  Laterality: N/A;   COLONOSCOPY WITH PROPOFOL N/A 05/29/2022   Procedure: COLONOSCOPY WITH PROPOFOL;  Surgeon: [name], MD;  Location: [facility];  Service: Gastroenterology;  Laterality: N/A;   CORONARY/GRAFT ACUTE MI REVASCULARIZATION N/A 10/26/2017   Procedure: Coronary/Graft Acute MI Revascularization;  Surgeon: Lennette Bihari, MD;  Location: [facility] INVASIVE CV LAB;  Service: Cardiovascular;  Laterality: N/A;   FLEXIBLE SIGMOIDOSCOPY N/A 12/12/2021   Procedure: FLEXIBLE SIGMOIDOSCOPY;  Surgeon: [name], MD;  Location: [facility];  Service: Gastroenterology;  Laterality: N/A;   LEFT HEART CATH AND CORONARY ANGIOGRAPHY N/A 10/26/2017   Procedure: LEFT HEART CATH AND CORONARY ANGIOGRAPHY;  Surgeon: Lennette Bihari, MD;  Location: [facility] INVASIVE CV LAB;  Service: Cardiovascular;  Laterality: N/A;   LEFT HEART CATH AND CORONARY ANGIOGRAPHY Left 05/05/2018   Procedure: Left heart cath and coronary angiography;  Surgeon: Laurier Nancy, MD;  Location: [facility] INVASIVE CV LAB;  Service: Cardiovascular;  Laterality: Left;   LUMBAR LAMINECTOMY/DECOMPRESSION MICRODISCECTOMY Left 06/05/2022   Procedure: LAMINECTOMY AND FORAMINOTOMY - LUMBAR FOUR-LUMBAR FIVE - LUMBAR FIVE-SACRAL ONE - LEFT;  Surgeon: Tia Alert, MD;  Location: [facility] OR;  Service: Neurosurgery;  Laterality: Left;  3C   MUSCLE BIOPSY Left  08/10/2019   Procedure: THIGH MUSCLE BIOPSY;  Surgeon: Abigail Miyamoto, MD;  Location: [facility] OR;  Service: General;  Laterality: Left;   SMALL INTESTINE SURGERY     TONSILLECTOMY     removed as a child    Family History  Problem Relation Age of Onset   Stroke Mother    Crohn's disease Neg Hx     Social History   Socioeconomic History   Marital status: Single    Spouse name: Not on file   Number of children: 1   Years of education: Not on file   Highest education level: Not on file  Occupational History   Not on file  Tobacco Use   Smoking status: Former    Current packs/day: 0.00    Average packs/day: 0.3 packs/day for 45.0 years (11.3 ttl pk-yrs)    Types: Cigarettes    Start date: 11/18/1976    Quit date: 11/18/2021    Years since quitting: 1.0   Smokeless tobacco: Never  Vaping Use   Vaping status: Never Used  Substance and Sexual Activity   Alcohol use: No   Drug use: No   Sexual  activity: Not on file  Other Topics Concern   Not on file  Social History Narrative   Not on file   Social Determinants of Health   Financial Resource Strain: Not on file  Food Insecurity: No Food Insecurity (06/06/2022)   Hunger Vital Sign    Worried About Running Out of Food in the Last Year: Never true    Ran Out of Food in the Last Year: Never true  Transportation Needs: No Transportation Needs (06/06/2022)   PRAPARE - Administrator, Civil Service (Medical): No    Lack of Transportation (Non-Medical): No  Physical Activity: Not on file  Stress: Not on file  Social Connections: Unknown (04/07/2022)   [facility] from [facility]   Social Network    Social Network: Not on file  Intimate Partner Violence: Not At Risk (06/06/2022)   Humiliation, Afraid, Rape, and Kick questionnaire    Fear of Current or Ex-Partner: No    Emotionally Abused: No    Physically Abused: No    Sexually Abused: No    Review of Systems  Constitutional:  Negative for chills  and fever.  HENT:  Negative for hearing loss.   Eyes:  Positive for blurred vision.  Respiratory:  Negative for cough.   Cardiovascular:  Negative for chest pain, palpitations and leg swelling.  Gastrointestinal:  Negative for abdominal pain, nausea and vomiting.  Musculoskeletal:  Positive for back pain.  Skin:  Negative for rash.  Neurological:  Negative for dizziness and headaches.  Psychiatric/Behavioral:  Negative for depression.      Objective    BP 114/75 (BP Location: Left Arm, Patient Position: Sitting, Cuff Size: Normal)   Pulse 88   Temp 97.8 F (36.6 C) (Oral)   Wt 161 lb 12.8 oz (73.4 kg)   LMP 02/18/2001   SpO2 98%   BMI 23.89 kg/m   Physical Exam HENT:     Head: Normocephalic and atraumatic.     Nose: Nose normal.     Mouth/Throat:     Mouth: Mucous membranes are moist.  Eyes:     Extraocular Movements: Extraocular movements intact.     Pupils: Pupils are equal, round, and reactive to light.  Cardiovascular:     Rate and Rhythm: Normal rate and regular rhythm.     Pulses: Normal pulses.     Heart sounds: Normal heart sounds.  Pulmonary:     Effort: Pulmonary effort is normal.     Breath sounds: Wheezing present.  Abdominal:     General: Bowel sounds are normal.     Palpations: Abdomen is soft.  Musculoskeletal:        General: No swelling.     Comments: B/l lower extremity strength 5/5, sensation intact.   Skin:    General: Skin is warm and dry.  Neurological:     General: No focal deficit present.     Mental Status: She is alert and oriented to person, place, and time.  Psychiatric:        Mood and Affect: Mood normal.        Behavior: Behavior normal.    Last metabolic panel Lab Results  Component Value Date   GLUCOSE 95 06/05/2022   NA 140 06/05/2022   K 4.0 06/05/2022   CL 108 06/05/2022   CO2 21 (L) 06/05/2022   BUN 14 06/05/2022   CREATININE 1.24 (H) 06/05/2022   GFRNONAA 47 (L) 06/05/2022   CALCIUM 8.8 (L) 06/05/2022   PHOS  3.0  02/02/2020   PROT 7.1 02/11/2022   ALBUMIN 2.8 (L) 02/03/2020   BILITOT 0.4 02/11/2022   ALKPHOS 80 02/03/2020   AST 40 (H) 02/11/2022   ALT 31 (H) 02/11/2022   ANIONGAP 11 06/05/2022        Assessment & Plan:   Problem List Items Addressed This Visit       Cardiovascular and Mediastinum   Chronic low blood pressure    BP of 114/75 today. Patient denies any weakness or feeling lightheaded on standing. Per patient, was taking midodrine 5 mg as needed for low blood pressures. Per chart review, Dr. Jens Som with cardiology suggested discontinuing midodrine on 07/01/22 as blood pressures have improved. Will continue to monitor.         Digestive   Crohn's disease of colon with complication [facility]) - Primary    Patient follows with Dr. [name], receives Remicade/infliximab infusions every 2 months. Will continue to monitor.         Other   Back pain    Patient with chronic back pain managed by oxycodone PRN (managed by Dr. [facility]), gabapentin 400 mg TID, and rest. Patient has not been evaluated by a physical therapist. Denies falls but endorses feeling unsteady from time to time on her feet. Ambulates unassisted. Will remove celecoxib from medication list as patient has CKD 3 diagnosis. Patient with normal b/l lower extremity strength and sensation on exam today. Patient open to working with PT to help evaluate current needs.  Plan:  - Refer to PT      Relevant Orders   Ambulatory referral to Physical Therapy   Encounter for smoking cessation counseling    Patient with long history of cigarette smoking dating back to teens. Has smoked up to 10 cigarettes or more a day, currently trying to cut down on smoking to about a pack a week. Not interested in medication assistance to help with quitting today but can revisit Chantix, nicotine patches or gum at future visits. Never completed a low dose screening CT chest, will order today as one time and will follow up  as needed.  Plan:  - Low dose CT scan ordered      Health care maintenance    Patient up to date with colonoscopy, mammogram, pneumovax, and COVID vaccine. Will follow up with flu vaccine when it is available. Will refer to dentistry today.       Relevant Orders   Ambulatory referral to Dentistry   Other Visit Diagnoses     Stage 3 chronic kidney disease, unspecified whether stage 3a or 3b CKD (HCC)       Relevant Orders   CBC no Diff   Elevated liver enzymes       Relevant Orders   COMPLETE METABOLIC PANEL WITH GFR   Tobacco abuse       Relevant Orders   CT CHEST LUNG CANCER SCREENING LOW DOSE WO CONTRAST   Heavy tobacco smoker >10 cigarettes per day       Relevant Orders   CT CHEST LUNG CANCER SCREENING LOW DOSE WO CONTRAST       Return in about 8 weeks (around 01/21/2023) for smoking cessation, follow up on referrals (PT, dentistry, CT scan).   Patient seen with Dr. Lafonda Mosses.   Phoebie Shad Colbert Coyer, MD

## 2022-11-26 NOTE — Assessment & Plan Note (Addendum)
Patient with long history of cigarette smoking dating back to teens. Has smoked up to 10 cigarettes or more a day, currently trying to cut down on smoking to about a pack a week. Not interested in medication assistance to help with quitting today but can revisit Chantix, nicotine patches or gum at future visits. Never completed a low dose screening CT chest, will order today as one time and will follow up as needed.  Plan:  - Low dose CT scan ordered

## 2022-11-26 NOTE — Patient Instructions (Signed)
Thank you, [name] for allowing [place] to provide your care today. Today we discussed your chronic health conditions including history of low blood pressure, Chron's, back pain, tobacco use, kidney disease, and next steps in your care.   I have ordered the following labs for you:  Lab Orders         CBC no Diff         COMPLETE METABOLIC PANEL WITH GFR      Tests ordered today:  CT image of your lungs.   Referrals ordered today:   Referral Orders         Ambulatory referral to Physical Therapy         Ambulatory referral to Dentistry      I have ordered the following medication/changed the following medications:   Stop the following medications: Medications Discontinued During This Encounter  Medication Reason   celecoxib (CELEBREX) 200 MG capsule      Start the following medications: No orders of the defined types were placed in this encounter.    Follow up: About 2 months   Remember:   - You have been referred for a physical therapy evaluation, CT imaging of your chest, and dentistry. They will all call you to schedule those appointments wherever is most convenient for you.   - You received lab forms for a complete blood count and a complete metabolic panel. I will be notified when the results come back and will call you to review them.   Should you have any questions or concerns please call the internal medicine clinic at [phone].      Colbert Coyer, MD PGY-1 Internal Medicine Teaching Progam [facility]

## 2022-11-28 NOTE — Assessment & Plan Note (Signed)
Patient with chronic back pain managed by oxycodone PRN (managed by Dr. [facility] management [facility]), gabapentin 400 mg TID, and rest. Patient has not been evaluated by a physical therapist. Denies falls but endorses feeling unsteady from time to time on her feet. Ambulates unassisted. Will remove celecoxib from medication list as patient has CKD 3 diagnosis. Patient with normal b/l lower extremity strength and sensation on exam today. Patient open to working with PT to help evaluate current needs.  Plan:  - Refer to PT

## 2022-11-28 NOTE — Assessment & Plan Note (Signed)
BP of 114/75 today. Patient denies any weakness or feeling lightheaded on standing. Per patient, was taking midodrine 5 mg as needed for low blood pressures. Per chart review, Dr. Jens Som with cardiology suggested discontinuing midodrine on 07/01/22 as blood pressures have improved. Will continue to monitor.

## 2022-11-28 NOTE — Assessment & Plan Note (Signed)
Patient up to date with colonoscopy, mammogram, pneumovax, and COVID vaccine. Will follow up with flu vaccine when it is available. Will refer to dentistry today.

## 2022-11-28 NOTE — Assessment & Plan Note (Signed)
Patient follows with Dr. [name], receives Remicade/infliximab infusions every 2 months. Will continue to monitor.

## 2022-11-30 DIAGNOSIS — R748 Abnormal levels of other serum enzymes: Secondary | ICD-10-CM | POA: Diagnosis not present

## 2022-11-30 DIAGNOSIS — N183 Chronic kidney disease, stage 3 unspecified: Secondary | ICD-10-CM | POA: Diagnosis not present

## 2022-12-01 ENCOUNTER — Telehealth: Payer: Self-pay | Admitting: Student

## 2022-12-01 LAB — CBC
HCT: 44.4 % (ref 35.0–45.0)
Hemoglobin: 14.5 g/dL (ref 11.7–15.5)
MCH: 30.3 pg (ref 27.0–33.0)
MCHC: 32.7 g/dL (ref 32.0–36.0)
MCV: 92.9 fL (ref 80.0–100.0)
MPV: 10.2 fL (ref 7.5–12.5)
Platelets: 310 10*3/uL (ref 140–400)
RBC: 4.78 10*6/uL (ref 3.80–5.10)
RDW: 14.2 % (ref 11.0–15.0)
WBC: 10.4 10*3/uL (ref 3.8–10.8)

## 2022-12-01 LAB — COMPLETE METABOLIC PANEL WITH GFR
AG Ratio: 1 (calc) (ref 1.0–2.5)
ALT: 76 U/L — ABNORMAL HIGH (ref 6–29)
AST: 85 U/L — ABNORMAL HIGH (ref 10–35)
Albumin: 4 g/dL (ref 3.6–5.1)
Alkaline phosphatase (APISO): 145 U/L (ref 37–153)
BUN/Creatinine Ratio: 12 (calc) (ref 6–22)
BUN: 23 mg/dL (ref 7–25)
CO2: 24 mmol/L (ref 20–32)
Calcium: 8.5 mg/dL — ABNORMAL LOW (ref 8.6–10.4)
Chloride: 105 mmol/L (ref 98–110)
Creat: 1.98 mg/dL — ABNORMAL HIGH (ref 0.50–1.05)
Globulin: 3.9 g/dL — ABNORMAL HIGH (ref 1.9–3.7)
Glucose, Bld: 96 mg/dL (ref 65–99)
Potassium: 3.7 mmol/L (ref 3.5–5.3)
Sodium: 139 mmol/L (ref 135–146)
Total Bilirubin: 0.5 mg/dL (ref 0.2–1.2)
Total Protein: 7.9 g/dL (ref 6.1–8.1)
eGFR: 27 mL/min/{1.73_m2} — ABNORMAL LOW (ref >=60)

## 2022-12-02 NOTE — Progress Notes (Signed)
Internal Medicine Clinic Attending  I was physically present during the key portions of the resident provided service and participated in the medical decision making of patient's management care. I reviewed pertinent patient test results.  The assessment, diagnosis, and plan were formulated together and I agree with the documentation in the resident's note.  Mercie Eon, MD     Patient's creatinine is up above baseline. Unclear if this represents AKI vs progression of CKD. I agree with plan to avoid NSAIDS, encourage hydration, decrease gabapentin dose, and repeat BMP within 1 month. If not improved, I suspect she will need referral to nephrology and additional evaluation (CMP, CBC, urine protein, SPEP, renal U/S).   For her transaminitis, her old PCP had been working that up. RUQ U/S was unremarkable. I think checking hepatitis serologies is the best next step, and we are working to get this done if patient can afford testing.

## 2022-12-08 ENCOUNTER — Telehealth: Payer: Self-pay | Admitting: Internal Medicine

## 2022-12-08 NOTE — Telephone Encounter (Signed)
Anne [name] number was forwarded to my pager from communications department. She established care with [facility] 08/15 and was noted to have worsening renal function. She was instructed to decrease gabapentin dose. Since then her legs have felt more weak bilaterally. She is taking 1 dose of gabapentin daily. I do not think that decreased gabapentin is cause for this an talked with patient about need for follow-up in person to evaluate.  Her weakness is bilateral so low suspicion for acute neurological change leading to symptom. Return precautions given for ED if weakness worsens.

## 2022-12-10 DIAGNOSIS — K5090 Crohn's disease, unspecified, without complications: Secondary | ICD-10-CM | POA: Diagnosis not present

## 2022-12-10 DIAGNOSIS — M2550 Pain in unspecified joint: Secondary | ICD-10-CM | POA: Diagnosis not present

## 2022-12-10 DIAGNOSIS — Z79899 Other long term (current) drug therapy: Secondary | ICD-10-CM | POA: Diagnosis not present

## 2022-12-10 DIAGNOSIS — M549 Dorsalgia, unspecified: Secondary | ICD-10-CM | POA: Diagnosis not present

## 2022-12-10 DIAGNOSIS — M1990 Unspecified osteoarthritis, unspecified site: Secondary | ICD-10-CM | POA: Diagnosis not present

## 2022-12-11 ENCOUNTER — Ambulatory Visit (INDEPENDENT_AMBULATORY_CARE_PROVIDER_SITE_OTHER): Payer: Medicare Other

## 2022-12-11 DIAGNOSIS — F17210 Nicotine dependence, cigarettes, uncomplicated: Secondary | ICD-10-CM | POA: Diagnosis not present

## 2022-12-11 DIAGNOSIS — Z720 Tobacco use: Secondary | ICD-10-CM

## 2022-12-15 ENCOUNTER — Telehealth: Payer: Self-pay

## 2022-12-15 NOTE — Telephone Encounter (Signed)
Pt states she fell on Sunday and her legs are weak. Requesting an appt for this week, no opening. Pt has upcoming appt 12/28/2022.

## 2022-12-15 NOTE — Telephone Encounter (Signed)
RTC to patient states [place] [employer] on Sunday.  Legs are weaker since she was told to cut her Gabapentin down from 3 times a day to 1 time a day.  States legs feel weaker and has problems getting up.  Gabapentin was 400 mg 3 times a day by her previous provider in [place].  Patient states that she does not feel like she broke anything as she fell on her butt.  Requesting an appointment for Thursday afternoon as she has an appointment to do something   No available appointments for now.  Patient to await call for appointment as she has to get a ride.

## 2022-12-16 NOTE — Telephone Encounter (Signed)
Call from patient's sister Jasmine December.  States patient is now unable to climb stairs.  No available appointments.  To take sister to the ER for assessment of her leg weakness.

## 2022-12-17 ENCOUNTER — Encounter (HOSPITAL_COMMUNITY): Payer: Self-pay | Admitting: Emergency Medicine

## 2022-12-17 ENCOUNTER — Emergency Department (HOSPITAL_COMMUNITY): Payer: Medicare Other

## 2022-12-17 ENCOUNTER — Other Ambulatory Visit: Payer: Self-pay

## 2022-12-17 ENCOUNTER — Inpatient Hospital Stay (HOSPITAL_COMMUNITY)
Admission: EM | Admit: 2022-12-17 | Discharge: 2022-12-23 | DRG: 558 | Disposition: A | Discharge status: Home / Self Care | Payer: Medicare Other | Attending: Internal Medicine | Admitting: Internal Medicine

## 2022-12-17 DIAGNOSIS — K50119 Crohn's disease of large intestine with unspecified complications: Secondary | ICD-10-CM | POA: Diagnosis present

## 2022-12-17 DIAGNOSIS — M879 Osteonecrosis, unspecified: Secondary | ICD-10-CM | POA: Diagnosis present

## 2022-12-17 DIAGNOSIS — R159 Full incontinence of feces: Secondary | ICD-10-CM | POA: Diagnosis present

## 2022-12-17 DIAGNOSIS — N1831 Chronic kidney disease, stage 3a: Secondary | ICD-10-CM | POA: Diagnosis present

## 2022-12-17 DIAGNOSIS — Z79899 Other long term (current) drug therapy: Secondary | ICD-10-CM

## 2022-12-17 DIAGNOSIS — I4581 Long QT syndrome: Secondary | ICD-10-CM | POA: Diagnosis not present

## 2022-12-17 DIAGNOSIS — N2589 Other disorders resulting from impaired renal tubular function: Secondary | ICD-10-CM | POA: Diagnosis present

## 2022-12-17 DIAGNOSIS — R531 Weakness: Secondary | ICD-10-CM | POA: Diagnosis not present

## 2022-12-17 DIAGNOSIS — M47816 Spondylosis without myelopathy or radiculopathy, lumbar region: Secondary | ICD-10-CM | POA: Diagnosis present

## 2022-12-17 DIAGNOSIS — Z955 Presence of coronary angioplasty implant and graft: Secondary | ICD-10-CM

## 2022-12-17 DIAGNOSIS — M545 Low back pain, unspecified: Secondary | ICD-10-CM | POA: Diagnosis not present

## 2022-12-17 DIAGNOSIS — I959 Hypotension, unspecified: Secondary | ICD-10-CM | POA: Diagnosis not present

## 2022-12-17 DIAGNOSIS — G8929 Other chronic pain: Secondary | ICD-10-CM | POA: Diagnosis present

## 2022-12-17 DIAGNOSIS — K50118 Crohn's disease of large intestine with other complication: Secondary | ICD-10-CM | POA: Diagnosis present

## 2022-12-17 DIAGNOSIS — I2510 Atherosclerotic heart disease of native coronary artery without angina pectoris: Secondary | ICD-10-CM | POA: Diagnosis not present

## 2022-12-17 DIAGNOSIS — R9431 Abnormal electrocardiogram [ECG] [EKG]: Secondary | ICD-10-CM | POA: Diagnosis not present

## 2022-12-17 DIAGNOSIS — E876 Hypokalemia: Secondary | ICD-10-CM | POA: Diagnosis present

## 2022-12-17 DIAGNOSIS — D1803 Hemangioma of intra-abdominal structures: Secondary | ICD-10-CM | POA: Diagnosis not present

## 2022-12-17 DIAGNOSIS — N179 Acute kidney failure, unspecified: Secondary | ICD-10-CM | POA: Diagnosis not present

## 2022-12-17 DIAGNOSIS — Z9049 Acquired absence of other specified parts of digestive tract: Secondary | ICD-10-CM | POA: Diagnosis not present

## 2022-12-17 DIAGNOSIS — Z7982 Long term (current) use of aspirin: Secondary | ICD-10-CM

## 2022-12-17 DIAGNOSIS — R823 Hemoglobinuria: Secondary | ICD-10-CM | POA: Diagnosis present

## 2022-12-17 DIAGNOSIS — M25551 Pain in right hip: Secondary | ICD-10-CM | POA: Diagnosis not present

## 2022-12-17 DIAGNOSIS — Z6822 Body mass index (BMI) 22.0-22.9, adult: Secondary | ICD-10-CM | POA: Diagnosis not present

## 2022-12-17 DIAGNOSIS — W19XXXA Unspecified fall, initial encounter: Secondary | ICD-10-CM | POA: Diagnosis present

## 2022-12-17 DIAGNOSIS — E872 Acidosis, unspecified: Secondary | ICD-10-CM | POA: Diagnosis not present

## 2022-12-17 DIAGNOSIS — N390 Urinary tract infection, site not specified: Secondary | ICD-10-CM | POA: Diagnosis present

## 2022-12-17 DIAGNOSIS — J439 Emphysema, unspecified: Secondary | ICD-10-CM | POA: Diagnosis not present

## 2022-12-17 DIAGNOSIS — I252 Old myocardial infarction: Secondary | ICD-10-CM

## 2022-12-17 DIAGNOSIS — R7989 Other specified abnormal findings of blood chemistry: Secondary | ICD-10-CM | POA: Diagnosis present

## 2022-12-17 DIAGNOSIS — K529 Noninfective gastroenteritis and colitis, unspecified: Secondary | ICD-10-CM | POA: Diagnosis present

## 2022-12-17 DIAGNOSIS — I213 ST elevation (STEMI) myocardial infarction of unspecified site: Secondary | ICD-10-CM | POA: Diagnosis not present

## 2022-12-17 DIAGNOSIS — M542 Cervicalgia: Secondary | ICD-10-CM | POA: Diagnosis not present

## 2022-12-17 DIAGNOSIS — R222 Localized swelling, mass and lump, trunk: Secondary | ICD-10-CM | POA: Diagnosis present

## 2022-12-17 DIAGNOSIS — M6282 Rhabdomyolysis: Secondary | ICD-10-CM | POA: Diagnosis not present

## 2022-12-17 DIAGNOSIS — I493 Ventricular premature depolarization: Secondary | ICD-10-CM | POA: Diagnosis present

## 2022-12-17 DIAGNOSIS — F17210 Nicotine dependence, cigarettes, uncomplicated: Secondary | ICD-10-CM | POA: Diagnosis not present

## 2022-12-17 DIAGNOSIS — R310 Gross hematuria: Secondary | ICD-10-CM | POA: Diagnosis not present

## 2022-12-17 DIAGNOSIS — R911 Solitary pulmonary nodule: Secondary | ICD-10-CM | POA: Diagnosis not present

## 2022-12-17 LAB — URINALYSIS, ROUTINE W REFLEX MICROSCOPIC
Bilirubin Urine: NEGATIVE
Glucose, UA: NEGATIVE mg/dL
Ketones, ur: NEGATIVE mg/dL
Nitrite: POSITIVE — AB
Protein, ur: 100 mg/dL — AB
Specific Gravity, Urine: 1.009 (ref 1.005–1.030)
pH: 7 (ref 5.0–8.0)

## 2022-12-17 LAB — RENAL FUNCTION PANEL
Albumin: 2.7 g/dL — ABNORMAL LOW (ref 3.5–5.0)
Anion gap: 7 (ref 5–15)
BUN: 17 mg/dL (ref 8–23)
CO2: 22 mmol/L (ref 22–32)
Calcium: 7.3 mg/dL — ABNORMAL LOW (ref 8.9–10.3)
Chloride: 108 mmol/L (ref 98–111)
Creatinine, Ser: 1.54 mg/dL — ABNORMAL HIGH (ref 0.44–1.00)
GFR, Estimated: 36 mL/min — ABNORMAL LOW (ref >60)
Glucose, Bld: 174 mg/dL — ABNORMAL HIGH (ref 70–99)
Phosphorus: 2 mg/dL — ABNORMAL LOW (ref 2.5–4.6)
Potassium: 2.9 mmol/L — ABNORMAL LOW (ref 3.5–5.1)
Sodium: 137 mmol/L (ref 135–145)

## 2022-12-17 LAB — BASIC METABOLIC PANEL
Anion gap: 11 (ref 5–15)
BUN: 20 mg/dL (ref 8–23)
CO2: 19 mmol/L — ABNORMAL LOW (ref 22–32)
Calcium: 7.9 mg/dL — ABNORMAL LOW (ref 8.9–10.3)
Chloride: 104 mmol/L (ref 98–111)
Creatinine, Ser: 1.6 mg/dL — ABNORMAL HIGH (ref 0.44–1.00)
GFR, Estimated: 35 mL/min — ABNORMAL LOW (ref >60)
Glucose, Bld: 184 mg/dL — ABNORMAL HIGH (ref 70–99)
Potassium: 2.6 mmol/L — CL (ref 3.5–5.1)
Sodium: 134 mmol/L — ABNORMAL LOW (ref 135–145)

## 2022-12-17 LAB — HEPATIC FUNCTION PANEL
ALT: 438 U/L — ABNORMAL HIGH (ref 0–44)
AST: 563 U/L — ABNORMAL HIGH (ref 15–41)
Albumin: 2.7 g/dL — ABNORMAL LOW (ref 3.5–5.0)
Alkaline Phosphatase: 102 U/L (ref 38–126)
Bilirubin, Direct: 0.1 mg/dL (ref 0.0–0.2)
Indirect Bilirubin: 0.3 mg/dL (ref 0.3–0.9)
Total Bilirubin: 0.4 mg/dL (ref 0.3–1.2)
Total Protein: 6.6 g/dL (ref 6.5–8.1)

## 2022-12-17 LAB — CBC
HCT: 45.9 % (ref 36.0–46.0)
Hemoglobin: 15.3 g/dL — ABNORMAL HIGH (ref 12.0–15.0)
MCH: 30.6 pg (ref 26.0–34.0)
MCHC: 33.3 g/dL (ref 30.0–36.0)
MCV: 91.8 fL (ref 80.0–100.0)
Platelets: 347 10*3/uL (ref 150–400)
RBC: 5 MIL/uL (ref 3.87–5.11)
RDW: 16.6 % — ABNORMAL HIGH (ref 11.5–15.5)
WBC: 8.7 10*3/uL (ref 4.0–10.5)
nRBC: 0 % (ref 0.0–0.2)

## 2022-12-17 LAB — TSH: TSH: 2.071 u[IU]/mL (ref 0.350–4.500)

## 2022-12-17 LAB — SEDIMENTATION RATE: Sed Rate: 22 mm/h (ref 0–22)

## 2022-12-17 LAB — HIV ANTIBODY (ROUTINE TESTING W REFLEX): HIV Screen 4th Generation wRfx: NONREACTIVE

## 2022-12-17 LAB — TROPONIN I (HIGH SENSITIVITY)
Troponin I (High Sensitivity): 39 ng/L — ABNORMAL HIGH (ref <18)
Troponin I (High Sensitivity): 51 ng/L — ABNORMAL HIGH (ref <18)

## 2022-12-17 LAB — C-REACTIVE PROTEIN: CRP: 0.5 mg/dL (ref <1.0)

## 2022-12-17 LAB — MAGNESIUM: Magnesium: 2.7 mg/dL — ABNORMAL HIGH (ref 1.7–2.4)

## 2022-12-17 LAB — CK: Total CK: 19024 U/L — ABNORMAL HIGH (ref 38–234)

## 2022-12-17 MED ORDER — POTASSIUM CHLORIDE 10 MEQ/100ML IV SOLN
10.0000 meq | Freq: Once | INTRAVENOUS | Status: AC
  Administered 2022-12-17: 10 meq via INTRAVENOUS
  Filled 2022-12-17: qty 100

## 2022-12-17 MED ORDER — OXYCODONE HCL 5 MG PO TABS
10.0000 mg | ORAL_TABLET | Freq: Once | ORAL | Status: AC
  Administered 2022-12-17: 10 mg via ORAL
  Filled 2022-12-17: qty 2

## 2022-12-17 MED ORDER — MAGNESIUM SULFATE 2 GM/50ML IV SOLN
2.0000 g | Freq: Once | INTRAVENOUS | Status: AC
  Administered 2022-12-17: 2 g via INTRAVENOUS
  Filled 2022-12-17: qty 50

## 2022-12-17 MED ORDER — POTASSIUM CHLORIDE CRYS ER 20 MEQ PO TBCR: 40.0000 meq | EXTENDED_RELEASE_TABLET | Freq: Once | ORAL | Status: DC

## 2022-12-17 MED ORDER — SODIUM CHLORIDE 0.9% FLUSH
3.0000 mL | Freq: Two times a day (BID) | INTRAVENOUS | Status: DC
  Administered 2022-12-17 – 2022-12-23 (×11): 3 mL via INTRAVENOUS

## 2022-12-17 MED ORDER — ENOXAPARIN SODIUM 40 MG/0.4ML IJ SOSY
40.0000 mg | PREFILLED_SYRINGE | INTRAMUSCULAR | Status: DC
  Administered 2022-12-17 – 2022-12-22 (×6): 40 mg via SUBCUTANEOUS
  Filled 2022-12-17 (×6): qty 0.4

## 2022-12-17 MED ORDER — SODIUM CHLORIDE 0.9 % IV SOLN
1.0000 g | Freq: Once | INTRAVENOUS | Status: AC
  Administered 2022-12-17: 1 g via INTRAVENOUS
  Filled 2022-12-17: qty 10

## 2022-12-17 MED ORDER — ALBUTEROL SULFATE (2.5 MG/3ML) 0.083% IN NEBU: 2.5000 mg | INHALATION_SOLUTION | RESPIRATORY_TRACT | Status: DC | PRN

## 2022-12-17 MED ORDER — POTASSIUM CHLORIDE CRYS ER 20 MEQ PO TBCR
40.0000 meq | EXTENDED_RELEASE_TABLET | Freq: Once | ORAL | Status: AC
  Administered 2022-12-17: 40 meq via ORAL
  Filled 2022-12-17: qty 2

## 2022-12-17 MED ORDER — LACTATED RINGERS IV BOLUS
1000.0000 mL | Freq: Once | INTRAVENOUS | Status: AC
  Administered 2022-12-17: 1000 mL via INTRAVENOUS

## 2022-12-17 MED ORDER — POTASSIUM PHOSPHATES 15 MMOLE/5ML IV SOLN
15.0000 mmol | Freq: Once | INTRAVENOUS | Status: AC
  Administered 2022-12-18: 15 mmol via INTRAVENOUS
  Filled 2022-12-17: qty 5

## 2022-12-17 NOTE — H&P (Addendum)
Date: 12/17/2022               Patient Name:  Anne [name] MRN: [id number]  DOB: Aug 28, 1955 Age / Sex: 67 y.o., female   PCP: Philomena Doheny, MD         Medical Service: Internal Medicine Teaching Service         Attending Physician: Dr. Inez Catalina, MD      First Contact: Dr. Manuela Neptune, MD Pager [phone]    Second Contact: Dr. Marrianne Mood, MD Pager [phone]         After Hours (After 5p/  First Contact Pager: [phone]  weekends / holidays): Second Contact Pager: [phone]   SUBJECTIVE   Chief Complaint: Weakness  History of Present Illness: Anne [name] is a 67 year old female with a past medical history of RTA type 1, Crohn's disease of colon, CKD stage 3a, CAD, Lumbar OA, S/P lumbar laminectomy with long term opioid treatment, and myositis in 2021 who presents to the emergency department with weakness.   Patient reports a one week history of progressive weakness in her bilateral lower extremities associated with bilateral feet and hand cramping. Her functional decline this past week was rapid and she now requires a cane for mobility and reported falling once. Patient strongly believes that her weakness was due to decreasing the dose of gabapentin due to her declining renal function. Patient reports decreased poor oral intake over this past week along with chronic diarrhea from the Crohn's disease. Her sister called the office yesterday with concerns of worsening weakness, she was unable to rise from a chair.    Patient reports dark red urine for the past two days. She stated that she first noticed dark red urine at home and then again at her pain management clinic today. She denies blood when she wipes, vaginal bleeding or blood in her stool. She denies dysuria, suprapubic pain, increase in urinary frequency, fever, chills, nausea, vomiting.  She has a history of Crohn's disease currently in remission per Rheumatology note 11/2022 on a regimen of  Remicade/infliximab followed by Dr. Elnoria Howard in GI. She denies active flare and reported that her Crohn's disease is stable and unchanged. She initially denied diarrhea, but reported that she has diarrhea IF she eats more than a few grapes or snacks throughout the day. She also endorses fecal incontinence and diarrhea about two nights a week . She wears depends throughout the day as well and changes them three times a day. She denied blood in the depends when she does change them. She denies abdominal pain, fever, chills, rashes, or joint pains.    Meds:  Gabapentin 400 mg TID Oxycodone 10mg -325mg , 2 tablets BID Zetia 10mg  daily Rosuvastatin 40mg  daily  NaBicar - TID  ASA 81 mg  Infliximab IV Cyclobenzaprine - not started yet Nitroglycerine - not taken in weeks   Past Medical History RTA type 1 Crohn's disease of colon CKD stage 3a CAD Lumbar OA S/P lumbar laminectomy with long term opioid treatment myositis in 2021  MI s/p stent placement 2019 Right renal cyst   Past Surgical History:  Procedure Laterality Date   ANTERIOR CERVICAL DECOMP/DISCECTOMY FUSION N/A 01/02/2022   Procedure: Anterior Cervical Decompression Fusion - Cervical three-Cervical four - Cervical four-Cervical five - Cervical five-Cervical six;  Surgeon: Tia Alert, MD;  Location: [facility] OR;  Service: Neurosurgery;  Laterality: N/A;   BACK SURGERY  2014   done in [place]   BIOPSY  09/22/2019  Procedure: BIOPSY;  Surgeon: [name], MD;  Location: [facility];  Service: Endoscopy;;   BIOPSY  05/29/2022   Procedure: BIOPSY;  Surgeon: [name], MD;  Location: [facility];  Service: Gastroenterology;;   CHOLECYSTECTOMY  2005   COLONOSCOPY WITH PROPOFOL N/A 09/22/2019   Procedure: COLONOSCOPY WITH PROPOFOL;  Surgeon: [name], MD;  Location: [facility] ENDOSCOPY;  Service: Endoscopy;  Laterality: N/A;   COLONOSCOPY WITH PROPOFOL N/A 05/29/2022   Procedure: COLONOSCOPY WITH PROPOFOL;  Surgeon: [name],  MD;  Location: [facility] ENDOSCOPY;  Service: Gastroenterology;  Laterality: N/A;   CORONARY/GRAFT ACUTE MI REVASCULARIZATION N/A 10/26/2017   Procedure: Coronary/Graft Acute MI Revascularization;  Surgeon: Lennette Bihari, MD;  Location: [facility] INVASIVE CV LAB;  Service: Cardiovascular;  Laterality: N/A;   FLEXIBLE SIGMOIDOSCOPY N/A 12/12/2021   Procedure: FLEXIBLE SIGMOIDOSCOPY;  Surgeon: [name], MD;  Location: [facility] ENDOSCOPY;  Service: Gastroenterology;  Laterality: N/A;   LEFT HEART CATH AND CORONARY ANGIOGRAPHY N/A 10/26/2017   Procedure: LEFT HEART CATH AND CORONARY ANGIOGRAPHY;  Surgeon: Lennette Bihari, MD;  Location: [facility] INVASIVE CV LAB;  Service: Cardiovascular;  Laterality: N/A;   LEFT HEART CATH AND CORONARY ANGIOGRAPHY Left 05/05/2018   Procedure: Left heart cath and coronary angiography;  Surgeon: Laurier Nancy, MD;  Location: [facility] INVASIVE CV LAB;  Service: Cardiovascular;  Laterality: Left;   LUMBAR LAMINECTOMY/DECOMPRESSION MICRODISCECTOMY Left 06/05/2022   Procedure: LAMINECTOMY AND FORAMINOTOMY - LUMBAR FOUR-LUMBAR FIVE - LUMBAR FIVE-SACRAL ONE - LEFT;  Surgeon: Tia Alert, MD;  Location: [facility] OR;  Service: Neurosurgery;  Laterality: Left;  3C   MUSCLE BIOPSY Left 08/10/2019   Procedure: THIGH MUSCLE BIOPSY;  Surgeon: Abigail Miyamoto, MD;  Location: [facility] OR;  Service: General;  Laterality: Left;   SMALL INTESTINE SURGERY     TONSILLECTOMY     removed as a child    Social:  Lives With: Sister, recently moved out of son's house due to stairs  Occupation:Retired [employer] Support:Strong support from son  Level of Function:Independent with ADLs, dependent with iADLs PCP: Philomena Doheny, MD at [facility] Substances:Smokes about 4 cigarettes per day, no alcohol or illicit drug use   Family History:  Maternal Grandmother: "fluid around the heart"  Allergies: Allergies as of 12/17/2022   (No Known Allergies)    Review of Systems: A complete ROS was negative except as per HPI.    OBJECTIVE:   Physical Exam: Blood pressure 109/73, pulse 67, temperature 98.2 F (36.8 C), temperature source Oral, resp. rate 17, last menstrual period 02/18/2001, SpO2 98%.  Constitutional: sitting in bed comfortably, in no acute distress, urine in bedside toilet is clear HENT: normocephalic atraumatic, mucous membranes moist Cardiovascular: regular rate and rhythm, no m/r/g, numerous PVCs present on telemetry  Pulmonary/Chest: normal work of breathing on room air, lungs clear to auscultation bilaterally. No crackles  Abdominal: soft, non-tender, non-distended.  Neurological: alert & oriented x 3, PERRLA, EOM, tongue protrusion is midline, no facial droop, no dysarthria, bilateral upper extremity strength is 5/5, bilateral lower extremity strength is 4/5, sensation is present and symmetrical on bilateral upper and lower extremities    MSK: no gross abnormalities. No pitting edema, no tenderness present, no tight muscles compartments, unable to evaluate proximal lower extremity muscle strength  Skin: warm and dry, no rashes present Psych: Normal mood and affect  Labs: CBC    Component Value Date/Time   WBC 8.7 12/17/2022 1547   RBC 5.00 12/17/2022 1547   HGB 15.3 (H) 12/17/2022 1547   HCT  45.9 12/17/2022 1547   PLT 347 12/17/2022 1547   MCV 91.8 12/17/2022 1547   MCH 30.6 12/17/2022 1547   MCHC 33.3 12/17/2022 1547   RDW 16.6 (H) 12/17/2022 1547   LYMPHSABS 1.9 02/02/2020 0554   MONOABS 1.1 (H) 02/02/2020 0554   EOSABS 0.0 02/02/2020 0554   BASOSABS 0.0 02/02/2020 0554     CMP     Component Value Date/Time   NA 137 12/17/2022 2215   K 2.9 (L) 12/17/2022 2215   CL 108 12/17/2022 2215   CO2 22 12/17/2022 2215   GLUCOSE 174 (H) 12/17/2022 2215   BUN 17 12/17/2022 2215   CREATININE 1.54 (H) 12/17/2022 2215   CREATININE 1.98 (H) 11/30/2022 1125   CALCIUM 7.3 (L) 12/17/2022 2215   PROT 6.6 12/17/2022 2215   ALBUMIN 2.7 (L) 12/17/2022 2215   ALBUMIN 2.7 (L) 12/17/2022  2215   AST 563 (H) 12/17/2022 2215   ALT 438 (H) 12/17/2022 2215   ALKPHOS 102 12/17/2022 2215   BILITOT 0.4 12/17/2022 2215   GFRNONAA 36 (L) 12/17/2022 2215   GFRAA >60 08/12/2019 0221    Imaging: DG Chest Portable 1 View  Result Date: 12/17/2022 CLINICAL DATA:  Weakness. EXAM: PORTABLE CHEST 1 VIEW COMPARISON:  02/01/2020, lung cancer screening CT 12/11/2022 FINDINGS: The cardiomediastinal contours are normal. The lungs are clear. Pulmonary vasculature is normal. No consolidation, pleural effusion, or pneumothorax. No acute osseous abnormalities are seen. IMPRESSION: No acute chest findings. Electronically Signed   By: Narda Rutherford M.D.   On: 12/17/2022 18:49      EKG: personally reviewed my interpretation is NSR with a Qtc of 625, no ST segment elevations observed. Prior EKG in October 2022 has similar findings with Qtc 446.  ASSESSMENT & PLAN:   Assessment & Plan by Problem: Principal Problem:   Hypokalemia Active Problems:   Generalized weakness   Crohn's disease of colon with complication (HCC)   Chronic diarrhea   Abnormal LFTs   Anne [name] is a 67 y.o. person living with a history of RTA type 1,  disease of colon, CKD stage 3a, CAD, chronic lumbar back pain with long term opoid treatment, myositis who presented with weakness and admitted for hypokalemia on hospital day 0  #Hypokalemia  #PVCs #Prolonged Qtc syndrome  #History of Distal RTA (Type 1) Initial BMP shows a potassium of 2.6 which has decreased from 3.7 on 11/30/2022, numerous PVCs present on the telemetry, and EKG with Qtc of 625 most likely due to her electrolyte abnormalities. She reports bilateral hand and feet cramping but denies palpitations. Her hypokalemia is likely causing the PVCs. The hypokalemia is most likely multifactorial due to fecal incontinence and diarrhea with Crohn's disease, poor oral intake, and history of persistent hypokalemia 2/2 RTA Type one diagnosed in 2021 requiring treatment of  sodium bicarbonate. Will consider other etiologies such as adrenal insufficieny due to chronically low blood pressure and hypokalemia which will be further evaluated with morning cortisol. Hypothyroidism was also considered but TSH is normal at 2.071. Plan: -Magnesium lab: 2.7, s/p 2g Magnesium -RFP resulted with Serum K of 2.9 -Recheck urine electrolytes  -total potassium supplemented: -AM cortisol -Urine osmolality  -Serum osmolality -Repeat EKG    #Weakness #Functional Deconditioning #Hemoglobinuria  #H/O Myositis  Patient presents with complaints of lower extremity weakness with immobility present for one week, two day history of dark red urine and with physical exam findings of 4/5 bilateral lower extremity weakness without sensation or other neuro deficits. Urinalysis  shows hemoglobinuria without hematuria. Her history of myositis with a prior CK of over 16,000 in 2021, symptoms and urinalysis findings are congruent with rhabdomyolysis. We will consider other causes of weakness such as functional deconditioning, hypothyroidism, hypokalemia induced myositis, and myopathies from extraintestinal manifestations of  disease or infectious causes.We will also consider adrenal insufficieny due to her chronically low blood pressure, will evaluate with AM cortisol.  Plan: -TSH: 2.071 -CK -RFP -AM cortisol -Urine osmolality  -Serum osmolality -Revaluate for proximal vs distal lower extremity weakness in the morning -PT consulted -OT consulted -Registered dietician consulted -Cardiac telemetry   #Asymptomatic Urinary Tract Infection  Initially consulted for concerns of an UTI found on an urinalysis. Patient denies dysuria, abdominal pain, suprapubic pain, increased frequency, and systemic symptoms. The UA did show bacteria and pyuria and she received one dose of ceftriaxone. Because she already received one dose of antibiotics and she is asymptomatic, will hold on further antibiotic  treatment at this time. Plan: -D/c ceftriaxone until team evaluates her in the morning.   #Acute kidney injury superimposed on CKD stage 3a Serum creatinine today has improved to 1.60 from 1.98 (11/30/22), baseline SCr is 1.2-1.3 where it appears she had an AKI at that time (labs on 10/02/22: Scr 1.34/ GFR 43). Unsure if today's serum creatine is due to a resolving AKI from 8/19 vs new AKI.  AKI is likely prerenal due to poor oral intake and less likely intrarenal, there were no casts presents on microscopy. Large hemoglobinuria on UA is concerning for rhabdomyolysis as stated above. Post renal causes of AKI such as nephrolithiasis at this time are also unlikely due to lack of symptoms and crystals on microscopy. Will bladder scan for retention, considered renal US in the morning.  Plan: -Repeat Scr: 1.54, GFR 36 -Will consider fluids  -Urine osmolality  -Serum osmolality -Trend renal function with RFP this evening and next morning -Avoid nephrotoxic agents at this moment  -Strict Is and Os -Bladder scan for retention  -Consider switching to dilaudid if renal function worsens.   #Elevated liver enzymes She has a gradual up trending of AST and ALT. On 8/19, AST/ALT was 85/76 and prior labs on 10/02/22 was 55/62. Elevation in AST and ALT with weakness and hemoglobinuria is concerning for rhabdomyolysis but may be from hepatitis, hemachromatosis, or wilson disease. A recent RUQ [place] is negative for ductal dilation and does not comment on the appearance of the liver in regards to hepatic steatosis vs cirrhosis vs normal appearance.  The gradual up trending is unlikely due to statin or Zetia therapy, no recent dose adjustments and she has been on these medications for years, and unlikely due to alcohol related liver disease, she does not have a history of large quantities of alcohol use. MASFLD is unlikely due to patient not meeting metabolic syndrome criteria.  Plan: -Iron studies -Ferritin  -HFP  today -CK -Acute hepatitis panel ordered  -Hold Zetia and Crestor for now   #Lumbar OA #S/P lumbar laminectomy with long term opioid treatment She is on a current regimen of two Oxycodone 10 mg- acetaminophen 325 mg BID for chronic lumbar pain with Dr. Malen Gauze at a pain clinic.  Plan: -Will continue pain regimen with oxycodone 10mg  2 tablets, BID  #Crohn's Disease of Colon  Patient is currently followed by GI with Dr. Elnoria Howard and is on a regimen of Remicade/infliximab every two months. She denied recent flares of Crohn's disease but endorses chronic fecal incontinence and diarrhea throughout the night.  Plan: -  Continue to follow up withy outpatient GI  -Please provide depends for fecal incontinence at night -CRP: 0.5  #CAD #STEMI s/p stent placement 2019 #Elevated troponins  She follows with cardiology and is currently asymptomatic, no complaints of chest pain. Initial troponins are elevated at 39 and repeated at 51. Low suspicion for ACS at this time, EKG is reassuring.  Plan: -Encourage tobacco cessation outpatient  -Continue Aspirin 81mg  -Hold statin and Zetia -Repeat troponin's    Diet: Renal VTE: Enoxaparin Code: Full  Prior to Admission Living Arrangement: Home, living with son Tawanna Cooler Anticipated Discharge Location: Home Barriers to Discharge: Medical Treatment  Dispo: Admit patient to Observation with expected length of stay less than 2 midnights.  Signed:   Faith Rogue, DO Internal Medicine Resident PGY-1 12/17/2022, 11:52 PM   Please contact the on call pager after 5 pm and on weekends at [phone].

## 2022-12-17 NOTE — ED Notes (Signed)
ED TO INPATIENT HANDOFF REPORT  ED Nurse Name and Phone #: Merry Lofty [phone]  S Name/Age/Gender Anne [name] 67 y.o. female Room/Bed: 011C/011C  Code Status   Code Status: Full Code  Home/SNF/Other Home Patient oriented to: self, place, time, and situation Is this baseline? Yes   Triage Complete: Triage complete  Chief Complaint Hypokalemia [E87.6]  Triage Note Pt presents with increasing weakness for several days.  Reports she has had a fall d/t her legs being weak and also reports left hip/back pain that radiates down her left leg.  Pt states she was taking 400 mg of gabapentin 3 times a day and was reduced to one gabapentin per day by internal medicine d/t her liver enzymes being elevated and states she has "been going down hill since then"  Pt also reports hematuria.    Allergies No Known Allergies  Level of Care/Admitting Diagnosis ED Disposition     ED Disposition  Admit   Condition  --   Comment  Hospital Area: [facility] [[id number]]  Level of Care: Telemetry Medical [104]  May place patient in observation at [facility] or [facility] if equivalent level of care is available:: No  Covid Evaluation: Asymptomatic - no recent exposure (last 10 days) testing not required  Diagnosis: Hypokalemia [454098]  Admitting Physician: Inez Catalina [[id number]]  Attending Physician: Nena Polio          B Medical/Surgery History Past Medical History:  Diagnosis Date   Coronary artery disease    Crohn's colitis (HCC)    Crohn's disease (HCC)    GERD (gastroesophageal reflux disease)    High cholesterol    Hyperlipidemia    Hypertension    Hypokalemia 08/04/2019   STEMI (ST elevation myocardial infarction) (HCC)    10/27/17 PCI/DES x1 to the dRCA, with residual thrombus in the PLA, normal EF   Past Surgical History:  Procedure Laterality Date   ANTERIOR CERVICAL DECOMP/DISCECTOMY FUSION N/A 01/02/2022   Procedure: Anterior Cervical  Decompression Fusion - Cervical three-Cervical four - Cervical four-Cervical five - Cervical five-Cervical six;  Surgeon: Tia Alert, MD;  Location: [facility] OR;  Service: Neurosurgery;  Laterality: N/A;   BACK SURGERY  2014   done in [place]   BIOPSY  09/22/2019   Procedure: BIOPSY;  Surgeon: [name], MD;  Location: [facility] ENDOSCOPY;  Service: Endoscopy;;   BIOPSY  05/29/2022   Procedure: BIOPSY;  Surgeon: [name], MD;  Location: [facility] ENDOSCOPY;  Service: Gastroenterology;;   CHOLECYSTECTOMY  2005   COLONOSCOPY WITH PROPOFOL N/A 09/22/2019   Procedure: COLONOSCOPY WITH PROPOFOL;  Surgeon: [name], MD;  Location: [facility] ENDOSCOPY;  Service: Endoscopy;  Laterality: N/A;   COLONOSCOPY WITH PROPOFOL N/A 05/29/2022   Procedure: COLONOSCOPY WITH PROPOFOL;  Surgeon: [name], MD;  Location: [facility] ENDOSCOPY;  Service: Gastroenterology;  Laterality: N/A;   CORONARY/GRAFT ACUTE MI REVASCULARIZATION N/A 10/26/2017   Procedure: Coronary/Graft Acute MI Revascularization;  Surgeon: Lennette Bihari, MD;  Location[facility] CV LAB;  Service: Cardiovascular;  Laterality: N/A;   FLEXIBLE SIGMOIDOSCOPY N/A 12/12/2021   Procedure: FLEXIBLE SIGMOIDOSCOPY;  Surgeon: [name], MD;  Location: [facility] ENDOSCOPY;  Service: Gastroenterology;  Laterality: N/A;   LEFT HEART CATH AND CORONARY ANGIOGRAPHY N/A 10/26/2017   Procedure: LEFT HEART CATH AND CORONARY ANGIOGRAPHY;  Surgeon: Lennette Bihari, MD;  Location: [facility] INVASIVE CV LAB;  Service: Cardiovascular;  Laterality: N/A;   LEFT HEART CATH AND CORONARY ANGIOGRAPHY Left 05/05/2018  Procedure: Left heart cath and coronary angiography;  Surgeon: Laurier Nancy, MD;  Location: [facility] INVASIVE CV LAB;  Service: Cardiovascular;  Laterality: Left;   LUMBAR LAMINECTOMY/DECOMPRESSION MICRODISCECTOMY Left 06/05/2022   Procedure: LAMINECTOMY AND FORAMINOTOMY - LUMBAR FOUR-LUMBAR FIVE - LUMBAR FIVE-SACRAL ONE - LEFT;  Surgeon: Tia Alert, MD;  Location: [facility] OR;   Service: Neurosurgery;  Laterality: Left;  3C   MUSCLE BIOPSY Left 08/10/2019   Procedure: THIGH MUSCLE BIOPSY;  Surgeon: Abigail Miyamoto, MD;  Location: [facility] OR;  Service: General;  Laterality: Left;   SMALL INTESTINE SURGERY     TONSILLECTOMY     removed as a child     A IV Location/Drains/Wounds Patient Lines/Drains/Airways Status     Active Line/Drains/Airways     Name Placement date Placement time Site Days   Peripheral IV 12/17/22 20 G 1.88" Anterior;Left Forearm 12/17/22  1824  Forearm  less than 1   Wound / Incision (Open or Dehisced) 08/04/19 Other (Comment) Vagina Right 08/04/19  1720  Vagina  1231            Intake/Output Last 24 hours  Intake/Output Summary (Last 24 hours) at 12/17/2022 2054 Last data filed at 12/17/2022 2050 Gross per 24 hour  Intake 250 ml  Output --  Net 250 ml    Labs/Imaging Results for orders placed or performed during the hospital encounter of 12/17/22 (from the past 48 hour(s))  Basic metabolic panel     Status: Abnormal   Collection Time: 12/17/22  3:47 PM  Result Value Ref Range   Sodium 134 (L) 135 - 145 mmol/L   Potassium 2.6 (LL) 3.5 - 5.1 mmol/L    Comment: CRITICAL RESULT CALLED TO, READ BACK BY AND VERIFIED WITH J JACKSON RN 12/17/2022 1651 BNUNNERY   Chloride 104 98 - 111 mmol/L   CO2 19 (L) 22 - 32 mmol/L   Glucose, Bld 184 (H) 70 - 99 mg/dL    Comment: Glucose reference range applies only to samples taken after fasting for at least 8 hours.   BUN 20 8 - 23 mg/dL   Creatinine, Ser 1.61 (H) 0.44 - 1.00 mg/dL   Calcium 7.9 (L) 8.9 - 10.3 mg/dL   GFR, Estimated 35 (L) >60 mL/min    Comment: (NOTE) Calculated using the CKD-EPI Creatinine Equation (2021)    Anion gap 11 5 - 15    Comment: Performed at [facility] Lab, [address] [place], [place] [address]  CBC     Status: Abnormal   Collection Time: 12/17/22  3:47 PM  Result Value Ref Range   WBC 8.7 4.0 - 10.5 K/uL   RBC 5.00 3.87 - 5.11 MIL/uL   Hemoglobin 15.3  (H) 12.0 - 15.0 g/dL   HCT 54.0 98.1 - 19.1 %   MCV 91.8 80.0 - 100.0 fL   MCH 30.6 26.0 - 34.0 pg   MCHC 33.3 30.0 - 36.0 g/dL   RDW 47.8 (H) 29.5 - 62.1 %   Platelets 347 150 - 400 K/uL   nRBC 0.0 0.0 - 0.2 %    Comment: Performed at [facility] Lab, [address], [place], [place] [address]  Troponin I (High Sensitivity)     Status: Abnormal   Collection Time: 12/17/22  4:19 PM  Result Value Ref Range   Troponin I (High Sensitivity) 39 (H) <18 ng/L    Comment: (NOTE) Elevated high sensitivity troponin I (hsTnI) values and significant  changes across serial measurements may suggest  ACS but many other  chronic and acute conditions are known to elevate hsTnI results.  Refer to the "Links" section for chest pain algorithms and additional  guidance. Performed at [facility] Lab, [address], [place], [place] [address]   Urinalysis, Routine w reflex microscopic -Urine, Clean Catch     Status: Abnormal   Collection Time: 12/17/22  5:59 PM  Result Value Ref Range   Color, Urine YELLOW YELLOW   APPearance HAZY (A) CLEAR   Specific Gravity, Urine 1.009 1.005 - 1.030   pH 7.0 5.0 - 8.0   Glucose, UA NEGATIVE NEGATIVE mg/dL   Hgb urine dipstick LARGE (A) NEGATIVE   Bilirubin Urine NEGATIVE NEGATIVE   Ketones, ur NEGATIVE NEGATIVE mg/dL   Protein, ur 191 (A) NEGATIVE mg/dL   Nitrite POSITIVE (A) NEGATIVE   Leukocytes,Ua TRACE (A) NEGATIVE   RBC / HPF 0-5 0 - 5 RBC/hpf   WBC, UA 21-50 0 - 5 WBC/hpf   Bacteria, UA MANY (A) NONE SEEN   Squamous Epithelial / HPF 0-5 0 - 5 /HPF   Mucus PRESENT     Comment: Performed at [facility] Lab, [address], [place], [place] [address]  Troponin I (High Sensitivity)     Status: Abnormal   Collection Time: 12/17/22  6:41 PM  Result Value Ref Range   Troponin I (High Sensitivity) 51 (H) <18 ng/L    Comment: (NOTE) Elevated high sensitivity troponin I (hsTnI) values and significant  changes across serial measurements may suggest  ACS but many other  chronic and acute conditions are known to elevate hsTnI results.  Refer to the "Links" section for chest pain algorithms and additional  guidance. Performed at [facility] Lab, [address], [place], [place] [address]    DG Chest Portable 1 View  Result Date: 12/17/2022 CLINICAL DATA:  Weakness. EXAM: PORTABLE CHEST 1 VIEW COMPARISON:  02/01/2020, lung cancer screening CT 12/11/2022 FINDINGS: The cardiomediastinal contours are normal. The lungs are clear. Pulmonary vasculature is normal. No consolidation, pleural effusion, or pneumothorax. No acute osseous abnormalities are seen. IMPRESSION: No acute chest findings. Electronically Signed   By: Narda Rutherford M.D.   On: 12/17/2022 18:49    Pending Labs Unresulted Labs (From admission, onward)     Start     Ordered   12/18/22 0600  Cortisol-am, blood  Tomorrow morning,   R       Comments: Please collect at 6AM    12/17/22 2037   12/18/22 0600  Renal function panel  Tomorrow morning,   R        12/17/22 2037   12/18/22 0600  Magnesium  Tomorrow morning,   R        12/17/22 2037   12/17/22 2041  Calcium / creatinine ratio, urine  Once,   R        12/17/22 2040   12/17/22 2040  Chloride, urine, random  Once,   R        12/17/22 2039   12/17/22 2035  CK  Once,   R        12/17/22 2037   12/17/22 2034  Osmolality, urine  Once,   R        12/17/22 2037   12/17/22 2034  Osmolality  Once,   R        12/17/22 2037   12/17/22 2027  TSH  Once,   R        12/17/22 2037  12/17/22 2026  Na and K (sodium & potassium), rand urine  Once,   R        12/17/22 2037   12/17/22 2024  Renal function panel  Once,   R        12/17/22 2037   12/17/22 2024  Hepatic function panel  Once,   R        12/17/22 2037   12/17/22 2002  HIV Antibody (routine testing w rflx)  (HIV Antibody (Routine testing w reflex) panel)  Once,   R        12/17/22 2008            Vitals/Pain Today's Vitals   12/17/22 1519 12/17/22 1540  12/17/22 1712  BP: 98/72  109/73  Pulse: 78  67  Resp: 14  17  Temp: (!) 97.5 F (36.4 C)  98.2 F (36.8 C)  TempSrc:   Oral  SpO2: 99%  98%  PainSc:  5      Isolation Precautions No active isolations  Medications Medications  enoxaparin (LOVENOX) injection 40 mg (has no administration in time range)  sodium chloride flush (NS) 0.9 % injection 3 mL (has no administration in time range)  albuterol (PROVENTIL) (2.5 MG/3ML) 0.083% nebulizer solution 2.5 mg (has no administration in time range)  potassium chloride SA (KLOR-CON M) CR tablet 40 mEq (40 mEq Oral Given 12/17/22 1724)  potassium chloride 10 mEq in 100 mL IVPB (0 mEq Intravenous Stopped 12/17/22 1944)  magnesium sulfate IVPB 2 g 50 mL (0 g Intravenous Stopped 12/17/22 1944)  cefTRIAXone (ROCEPHIN) 1 g in sodium chloride 0.9 % 100 mL IVPB (0 g Intravenous Stopped 12/17/22 2050)  oxyCODONE (Oxy IR/ROXICODONE) immediate release tablet 10 mg (10 mg Oral Given 12/17/22 1938)    Mobility Walks (unknown if uses an aide)     Focused Assessments Cardiac Assessment Handoff:    Lab Results  Component Value Date   CKTOTAL 91 02/02/2020   TROPONINI 12.23 (HH) 10/27/2017   No results found for: "DDIMER" Does the Patient currently have chest pain? No    R Recommendations: See Admitting Provider Note  Report given to:   Additional Notes: Pt coming in with increased weakness over past few days, had a fall at home d/t weakness. Complaints of left hip/back pain. Also has chronic leg pain. Resolved hematuria. UA shows possible UTI, treating with ceftriaxone until culture comes back for confirmation. Admitting d/t hypokalemia. Pt given oral replacement and IV thus far. Continuous cardiac monitoring d/t hypokalemia. Pt A&Ox4. Uses commode at bedside. Ultrasound 20G in L forearm for IV access. Pt also given IV Mg. Pt takes 10mg  oral oxycodone for pain at home, given same dose here.

## 2022-12-17 NOTE — Hospital Course (Addendum)
Urine was red [ ]  drinking [ ]  blood per vagina or rectum, no  No belly pain Just the back pain that is usual to the her. No new back pain.  No nausea or vomiting No diarrhea Less eating than in the past Her Bms are solid if she doesn;t   But regular meal she has episidoes of diarrhea and incontinence. She food avoidance  Just feeling weak  Over one week, ever since she was seen in the [facility] She had a fall Increased muscle cramping, which has also been happening for about the same time [ ]  what changes did they make there   Patient does not have dysuria but there increased frequency. But she reports that is normal to her as   SOB at baseline No chest pain, lightheadedness, changes in vision.    Crohn's  Next one is on Thursday Aug the 30th This doesn't Patient wears depends briefs and changes TID, about   Drinks a lot of water  - gallon Chronic low blood pressure Patient was seen by her pain specialist today Patient was also seen by the PT at home today  Medications Gabapentin 400 mg TID Oxycodone 2 in AM and 2 at night ( 7 and 7) Zetia 10 Rosuvastatin  NaBicar - TID (heart doctor) ASA Infliximab IV Cyclobenzaprine - not started yet Nitroglycerine - not taken in weeks   Medical history CAD MI s/p stent placement - when? CKD3 Crohn's disease Tobacco use - 4 cigarettes R renal cyst  Osteoarthritis of spine Hx of myositis on 08/2019   Telemetry - PVCs  RUQ on 10/2022 - Crohn' s disease with ileitis. Inflammation was found. This was severe. Biopsied. - Patent functional end- to- end colo- colonic anastomosis, characterized by erosion and inflammation. - Diverticulosis in the ascending colon  Last colonoscopy in Feb 2024 - Crohn' s disease with ileitis. Inflammation was found. This was severe. Biopsied.  - Patent functional end- to- end colo- colonic anastomosis, characterized by erosion and inflammation. - Diverticulosis in the ascending colon.  Muscle  biopsy in 2021: no active inflammation but few necrotic degenerative and regenerative fibers.  Patient was actually stopped on statins in 2021 but has restarted without issues per Dr. Debria Garret note on 12/10/2022  .   [ ]  restarted on statins [ ]  ?dose increased? When did they add Zetia   9/6 Reports feeling not good because she was getting blood drawn all night and she is hurting along her right groin line. Having trouble lifting her legs and hurts underneath the left thigh.  Fell on Sunday getting off a step because her legs gave out. Unable to lift things and hip.   9/7

## 2022-12-17 NOTE — ED Provider Notes (Signed)
[place] EMERGENCY DEPARTMENT AT [facility] Provider Note   CSN: [id number] Arrival date & time: 12/17/22  1511     History  Chief Complaint  Patient presents with   Weakness    Anne [name] is a 67 y.o. female.  This is a 67 year old female who is here today for 1 week of weakness.  Patient says that since her gabapentin doses were changed, she has been feeling increasingly weak.  She also endorses hematuria.  She has not had fever, abdominal pain or chills.  Patient does have history of Crohn's disease.  I reviewed the patient's PCP office note from this past week.  It seems as though her kidney function has been gradually worsening.   Weakness      Home Medications Prior to Admission medications   Medication Sig Start Date End Date Taking? Authorizing Provider  aspirin EC 81 MG tablet Take 1 tablet (81 mg total) by mouth daily. Swallow whole. 12/31/21   Lewayne Bunting, MD  cholestyramine Lanetta Inch) 4 g packet Take 1 packet (4 g total) by mouth 2 (two) times daily. 10/09/22 01/07/23  Sherron Monday, MD  cyclobenzaprine (FLEXERIL) 10 MG tablet TAKE 1 TABLET BY MOUTH TWICE DAILY 10/02/22   Sherron Monday, MD  ezetimibe (ZETIA) 10 MG tablet TAKE 1 TABLET(10 MG) BY MOUTH AT BEDTIME 10/09/22   Sherron Monday, MD  gabapentin (NEURONTIN) 400 MG capsule Take 1 capsule (400 mg total) by mouth 3 (three) times daily. 10/09/22 01/07/23  Sherron Monday, MD  INFLIXIMAB IV Inject 1 Dose into the vein every 8 (eight) weeks.    [provider]  Multiple Vitamin (MULTIVITAMIN WITH MINERALS) TABS tablet Take 1 tablet by mouth in the morning.    [provider]  nitroGLYCERIN (NITROSTAT) 0.4 MG SL tablet Place 1 tablet (0.4 mg total) under the tongue every 5 (five) minutes x 3 doses as needed for chest pain. 10/28/17   Arty Baumgartner, NP  oxyCODONE (ROXICODONE) 5 MG immediate release tablet Take 1-2 tablets (5-10 mg total) by mouth every 6 (six)  hours as needed for severe pain (Postoperative pain). 06/06/22   Val Eagle D, NP  rosuvastatin (CRESTOR) 40 MG tablet Take 1 tablet (40 mg total) by mouth daily. 10/09/22 01/07/23  Sherron Monday, MD  sodium bicarbonate 650 MG tablet TAKE 3 TABLETS(1950 MG) BY MOUTH TWICE DAILY 10/02/22   Sherron Monday, MD      Allergies    Patient has no known allergies.    Review of Systems   Review of Systems  Neurological:  Positive for weakness.    Physical Exam Updated Vital Signs BP 116/75 (BP Location: Left Arm)   Pulse 78   Temp 98.3 F (36.8 C) (Oral)   Resp 18   LMP 02/18/2001   SpO2 96%  Physical Exam Vitals reviewed.  Constitutional:      Appearance: She is not ill-appearing.  HENT:     Head: Normocephalic.  Eyes:     Pupils: Pupils are equal, round, and reactive to light.  Cardiovascular:     Rate and Rhythm: Normal rate and regular rhythm.  Pulmonary:     Effort: Pulmonary effort is normal.  Abdominal:     General: Abdomen is flat. There is no distension.     Palpations: Abdomen is soft.     Tenderness: There is no abdominal tenderness. There is no guarding.  Neurological:     General: No focal deficit present.  Mental Status: She is alert and oriented to person, place, and time.     Motor: No weakness.     Gait: Gait normal.     ED Results / Procedures / Treatments   Labs (all labs ordered are listed, but only abnormal results are displayed) Labs Reviewed  BASIC METABOLIC PANEL - Abnormal; Notable for the following components:      Result Value   Sodium 134 (*)    Potassium 2.6 (*)    CO2 19 (*)    Glucose, Bld 184 (*)    Creatinine, Ser 1.60 (*)    Calcium 7.9 (*)    GFR, Estimated 35 (*)    All other components within normal limits  CBC - Abnormal; Notable for the following components:   Hemoglobin 15.3 (*)    RDW 16.6 (*)    All other components within normal limits  URINALYSIS, ROUTINE W REFLEX MICROSCOPIC - Abnormal; Notable for the  following components:   APPearance HAZY (*)    Hgb urine dipstick LARGE (*)    Protein, ur 100 (*)    Nitrite POSITIVE (*)    Leukocytes,Ua TRACE (*)    Bacteria, UA MANY (*)    All other components within normal limits  RENAL FUNCTION PANEL - Abnormal; Notable for the following components:   Potassium 2.9 (*)    Glucose, Bld 174 (*)    Creatinine, Ser 1.54 (*)    Calcium 7.3 (*)    Phosphorus 2.0 (*)    Albumin 2.7 (*)    GFR, Estimated 36 (*)    All other components within normal limits  MAGNESIUM - Abnormal; Notable for the following components:   Magnesium 2.7 (*)    All other components within normal limits  TROPONIN I (HIGH SENSITIVITY) - Abnormal; Notable for the following components:   Troponin I (High Sensitivity) 39 (*)    All other components within normal limits  TROPONIN I (HIGH SENSITIVITY) - Abnormal; Notable for the following components:   Troponin I (High Sensitivity) 51 (*)    All other components within normal limits  TSH  C-REACTIVE PROTEIN  HIV ANTIBODY (ROUTINE TESTING W REFLEX)  HEPATIC FUNCTION PANEL  NA AND K (SODIUM & POTASSIUM), RAND UR  CORTISOL-AM, BLOOD  OSMOLALITY, URINE  OSMOLALITY  CK  RENAL FUNCTION PANEL  MAGNESIUM  CHLORIDE, URINE, RANDOM  CALCIUM / CREATININE RATIO, URINE  SEDIMENTATION RATE  HEPATITIS PANEL, ACUTE  FERRITIN  IRON AND TIBC    EKG EKG Interpretation Date/Time:  Thursday December 17 2022 17:00:00 EDT Ventricular Rate:  81 PR Interval:  137 QRS Duration:  115 QT Interval:  538 QTC Calculation: 625 R Axis:   -14  Text Interpretation: Sinus rhythm Nonspecific intraventricular conduction delay Low voltage, extremity and precordial leads Confirmed by Anders Simmonds [phone]) on 12/17/2022 5:02:33 PM  Radiology DG Chest Portable 1 View  Result Date: 12/17/2022 CLINICAL DATA:  Weakness. EXAM: PORTABLE CHEST 1 VIEW COMPARISON:  02/01/2020, lung cancer screening CT 12/11/2022 FINDINGS: The cardiomediastinal contours  are normal. The lungs are clear. Pulmonary vasculature is normal. No consolidation, pleural effusion, or pneumothorax. No acute osseous abnormalities are seen. IMPRESSION: No acute chest findings. Electronically Signed   By: Narda Rutherford M.D.   On: 12/17/2022 18:49    Procedures Procedures    Medications Ordered in ED Medications  enoxaparin (LOVENOX) injection 40 mg (40 mg Subcutaneous Given 12/17/22 2248)  sodium chloride flush (NS) 0.9 % injection 3 mL (3 mLs Intravenous Given 12/17/22 2252)  albuterol (PROVENTIL) (2.5 MG/3ML) 0.083% nebulizer solution 2.5 mg (has no administration in time range)  potassium chloride SA (KLOR-CON M) CR tablet 40 mEq (has no administration in time range)  potassium chloride SA (KLOR-CON M) CR tablet 40 mEq (40 mEq Oral Given 12/17/22 1724)  potassium chloride 10 mEq in 100 mL IVPB (0 mEq Intravenous Stopped 12/17/22 1944)  magnesium sulfate IVPB 2 g 50 mL (0 g Intravenous Stopped 12/17/22 1944)  cefTRIAXone (ROCEPHIN) 1 g in sodium chloride 0.9 % 100 mL IVPB (0 g Intravenous Stopped 12/17/22 2050)  oxyCODONE (Oxy IR/ROXICODONE) immediate release tablet 10 mg (10 mg Oral Given 12/17/22 1938)  lactated ringers bolus 1,000 mL (1,000 mLs Intravenous New Bag/Given 12/17/22 2248)    ED Course/ Medical Decision Making/ A&P                                 Medical Decision Making This is a 67 year old female who is here today for generalized weakness.  Differential diagnoses include cystitis, AKI, electrolyte abnormalities, dehydration, ACS, pneumonia.  Plan- patient without any focal or lateralizing deficits.  Do not believe that she requires any imaging of the head.  Will obtain labs on the patient.  I do have suspicion the patient's kidney function may be continuing to get worse from her PCP office visit.  Patient also reporting hematuria, will check urinalysis.  EKG and troponin ordered.  Reassessment-patient hypokalemic, UTI.  Admitted to hospitalist.  Amount and/or  Complexity of Data Reviewed Labs: ordered. Radiology: ordered.  Risk Prescription drug management. Decision regarding hospitalization.           Final Clinical Impression(s) / ED Diagnoses Final diagnoses:  Hypokalemia  Lower urinary tract infectious disease    Rx / DC Orders ED Discharge Orders     None         Arletha Pili, DO 12/17/22 2322

## 2022-12-17 NOTE — ED Triage Notes (Signed)
Pt presents with increasing weakness for several days.  Reports she has had a fall d/t her legs being weak and also reports left hip/back pain that radiates down her left leg.  Pt states she was taking 400 mg of gabapentin 3 times a day and was reduced to one gabapentin per day by internal medicine d/t her liver enzymes being elevated and states she has "been going down hill since then"  Pt also reports hematuria.

## 2022-12-18 ENCOUNTER — Observation Stay (HOSPITAL_COMMUNITY): Payer: Medicare Other

## 2022-12-18 DIAGNOSIS — Z7982 Long term (current) use of aspirin: Secondary | ICD-10-CM | POA: Diagnosis not present

## 2022-12-18 DIAGNOSIS — I959 Hypotension, unspecified: Secondary | ICD-10-CM | POA: Diagnosis not present

## 2022-12-18 DIAGNOSIS — I4581 Long QT syndrome: Secondary | ICD-10-CM

## 2022-12-18 DIAGNOSIS — M6282 Rhabdomyolysis: Secondary | ICD-10-CM

## 2022-12-18 DIAGNOSIS — N1831 Chronic kidney disease, stage 3a: Secondary | ICD-10-CM | POA: Diagnosis present

## 2022-12-18 DIAGNOSIS — E872 Acidosis, unspecified: Secondary | ICD-10-CM | POA: Diagnosis present

## 2022-12-18 DIAGNOSIS — M47816 Spondylosis without myelopathy or radiculopathy, lumbar region: Secondary | ICD-10-CM | POA: Diagnosis present

## 2022-12-18 DIAGNOSIS — I213 ST elevation (STEMI) myocardial infarction of unspecified site: Secondary | ICD-10-CM | POA: Diagnosis not present

## 2022-12-18 DIAGNOSIS — R159 Full incontinence of feces: Secondary | ICD-10-CM | POA: Diagnosis present

## 2022-12-18 DIAGNOSIS — Z955 Presence of coronary angioplasty implant and graft: Secondary | ICD-10-CM | POA: Diagnosis not present

## 2022-12-18 DIAGNOSIS — R222 Localized swelling, mass and lump, trunk: Secondary | ICD-10-CM | POA: Diagnosis present

## 2022-12-18 DIAGNOSIS — M879 Osteonecrosis, unspecified: Secondary | ICD-10-CM | POA: Diagnosis present

## 2022-12-18 DIAGNOSIS — E876 Hypokalemia: Secondary | ICD-10-CM | POA: Diagnosis present

## 2022-12-18 DIAGNOSIS — N390 Urinary tract infection, site not specified: Secondary | ICD-10-CM | POA: Diagnosis present

## 2022-12-18 DIAGNOSIS — G8929 Other chronic pain: Secondary | ICD-10-CM | POA: Diagnosis present

## 2022-12-18 DIAGNOSIS — N179 Acute kidney failure, unspecified: Secondary | ICD-10-CM | POA: Diagnosis present

## 2022-12-18 DIAGNOSIS — W19XXXA Unspecified fall, initial encounter: Secondary | ICD-10-CM | POA: Diagnosis present

## 2022-12-18 DIAGNOSIS — N2589 Other disorders resulting from impaired renal tubular function: Secondary | ICD-10-CM | POA: Diagnosis present

## 2022-12-18 DIAGNOSIS — D1803 Hemangioma of intra-abdominal structures: Secondary | ICD-10-CM | POA: Diagnosis not present

## 2022-12-18 DIAGNOSIS — R823 Hemoglobinuria: Secondary | ICD-10-CM | POA: Diagnosis present

## 2022-12-18 DIAGNOSIS — I493 Ventricular premature depolarization: Secondary | ICD-10-CM | POA: Diagnosis present

## 2022-12-18 DIAGNOSIS — I2510 Atherosclerotic heart disease of native coronary artery without angina pectoris: Secondary | ICD-10-CM | POA: Diagnosis present

## 2022-12-18 DIAGNOSIS — J439 Emphysema, unspecified: Secondary | ICD-10-CM | POA: Diagnosis not present

## 2022-12-18 DIAGNOSIS — Z9049 Acquired absence of other specified parts of digestive tract: Secondary | ICD-10-CM | POA: Diagnosis not present

## 2022-12-18 DIAGNOSIS — R531 Weakness: Secondary | ICD-10-CM | POA: Diagnosis present

## 2022-12-18 DIAGNOSIS — M25551 Pain in right hip: Secondary | ICD-10-CM

## 2022-12-18 DIAGNOSIS — R911 Solitary pulmonary nodule: Secondary | ICD-10-CM | POA: Diagnosis not present

## 2022-12-18 DIAGNOSIS — K50118 Crohn's disease of large intestine with other complication: Secondary | ICD-10-CM | POA: Diagnosis present

## 2022-12-18 DIAGNOSIS — F17210 Nicotine dependence, cigarettes, uncomplicated: Secondary | ICD-10-CM | POA: Diagnosis present

## 2022-12-18 DIAGNOSIS — R9431 Abnormal electrocardiogram [ECG] [EKG]: Secondary | ICD-10-CM | POA: Diagnosis present

## 2022-12-18 DIAGNOSIS — I252 Old myocardial infarction: Secondary | ICD-10-CM | POA: Diagnosis not present

## 2022-12-18 HISTORY — DX: Rhabdomyolysis: M62.82

## 2022-12-18 LAB — CBC WITH DIFFERENTIAL/PLATELET
Abs Immature Granulocytes: 0.04 10*3/uL (ref 0.00–0.07)
Basophils Absolute: 0 10*3/uL (ref 0.0–0.1)
Basophils Relative: 0 %
Eosinophils Absolute: 0 10*3/uL (ref 0.0–0.5)
Eosinophils Relative: 1 %
HCT: 39.6 % (ref 36.0–46.0)
Hemoglobin: 13.3 g/dL (ref 12.0–15.0)
Immature Granulocytes: 1 %
Lymphocytes Relative: 34 %
Lymphs Abs: 2.9 10*3/uL (ref 0.7–4.0)
MCH: 29.9 pg (ref 26.0–34.0)
MCHC: 33.6 g/dL (ref 30.0–36.0)
MCV: 89 fL (ref 80.0–100.0)
Monocytes Absolute: 0.7 10*3/uL (ref 0.1–1.0)
Monocytes Relative: 9 %
Neutro Abs: 4.6 10*3/uL (ref 1.7–7.7)
Neutrophils Relative %: 55 %
Platelets: 271 10*3/uL (ref 150–400)
RBC: 4.45 MIL/uL (ref 3.87–5.11)
RDW: 16.6 % — ABNORMAL HIGH (ref 11.5–15.5)
WBC: 8.3 10*3/uL (ref 4.0–10.5)
nRBC: 0 % (ref 0.0–0.2)

## 2022-12-18 LAB — HEPATIC FUNCTION PANEL
ALT: 330 U/L — ABNORMAL HIGH (ref 0–44)
AST: 408 U/L — ABNORMAL HIGH (ref 15–41)
Albumin: 2.3 g/dL — ABNORMAL LOW (ref 3.5–5.0)
Alkaline Phosphatase: 89 U/L (ref 38–126)
Bilirubin, Direct: <0.1 mg/dL (ref 0.0–0.2)
Total Bilirubin: 0.4 mg/dL (ref 0.3–1.2)
Total Protein: 5.7 g/dL — ABNORMAL LOW (ref 6.5–8.1)

## 2022-12-18 LAB — RENAL FUNCTION PANEL
Albumin: 2.2 g/dL — ABNORMAL LOW (ref 3.5–5.0)
Anion gap: 8 (ref 5–15)
BUN: 13 mg/dL (ref 8–23)
CO2: 20 mmol/L — ABNORMAL LOW (ref 22–32)
Calcium: 7.5 mg/dL — ABNORMAL LOW (ref 8.9–10.3)
Chloride: 112 mmol/L — ABNORMAL HIGH (ref 98–111)
Creatinine, Ser: 1.27 mg/dL — ABNORMAL HIGH (ref 0.44–1.00)
GFR, Estimated: 46 mL/min — ABNORMAL LOW (ref >60)
Glucose, Bld: 91 mg/dL (ref 70–99)
Phosphorus: 2.5 mg/dL (ref 2.5–4.6)
Potassium: 3.2 mmol/L — ABNORMAL LOW (ref 3.5–5.1)
Sodium: 140 mmol/L (ref 135–145)

## 2022-12-18 LAB — IRON AND TIBC
Iron: 55 ug/dL (ref 28–170)
Saturation Ratios: 15 % (ref 10.4–31.8)
TIBC: 370 ug/dL (ref 250–450)
UIBC: 315 ug/dL

## 2022-12-18 LAB — HEPATITIS PANEL, ACUTE
HCV Ab: NONREACTIVE
Hep A IgM: NONREACTIVE
Hep B C IgM: NONREACTIVE
Hepatitis B Surface Ag: NONREACTIVE

## 2022-12-18 LAB — APTT
aPTT: >200 s (ref 24–36)
aPTT: 23 s — ABNORMAL LOW (ref 24–36)

## 2022-12-18 LAB — GLUCOSE, CAPILLARY: Glucose-Capillary: 119 mg/dL — ABNORMAL HIGH (ref 70–99)

## 2022-12-18 LAB — TECHNOLOGIST SMEAR REVIEW
Clinical Information: ELEVATED
Plt Morphology: NORMAL

## 2022-12-18 LAB — PROTIME-INR
INR: 2.6 — ABNORMAL HIGH (ref 0.8–1.2)
INR: 1.4 — ABNORMAL HIGH (ref 0.8–1.2)
Prothrombin Time: 28 s — ABNORMAL HIGH (ref 11.4–15.2)
Prothrombin Time: 17.8 s — ABNORMAL HIGH (ref 11.4–15.2)

## 2022-12-18 LAB — TROPONIN I (HIGH SENSITIVITY): Troponin I (High Sensitivity): 61 ng/L — ABNORMAL HIGH (ref <18)

## 2022-12-18 LAB — FIBRINOGEN: Fibrinogen: 405 mg/dL (ref 210–475)

## 2022-12-18 LAB — MAGNESIUM: Magnesium: 2.2 mg/dL (ref 1.7–2.4)

## 2022-12-18 LAB — OSMOLALITY: Osmolality: 307 mosm/kg — ABNORMAL HIGH (ref 275–295)

## 2022-12-18 LAB — LACTIC ACID, PLASMA: Lactic Acid, Venous: 0.9 mmol/L (ref 0.5–1.9)

## 2022-12-18 LAB — CK: Total CK: 14656 U/L — ABNORMAL HIGH (ref 38–234)

## 2022-12-18 LAB — LACTATE DEHYDROGENASE: LDH: 659 U/L — ABNORMAL HIGH (ref 98–192)

## 2022-12-18 LAB — FERRITIN: Ferritin: 336 ng/mL — ABNORMAL HIGH (ref 11–307)

## 2022-12-18 LAB — CORTISOL-AM, BLOOD: Cortisol - AM: 10.8 ug/dL (ref 6.7–22.6)

## 2022-12-18 LAB — ACETAMINOPHEN LEVEL: Acetaminophen (Tylenol), Serum: <10 ug/mL — ABNORMAL LOW (ref 10–30)

## 2022-12-18 MED ORDER — LACTATED RINGERS IV SOLN: INTRAVENOUS | Status: DC

## 2022-12-18 MED ORDER — LACTATED RINGERS IV BOLUS
1000.0000 mL | Freq: Once | INTRAVENOUS | Status: AC
  Administered 2022-12-18: 1000 mL via INTRAVENOUS

## 2022-12-18 MED ORDER — POTASSIUM CHLORIDE CRYS ER 20 MEQ PO TBCR
40.0000 meq | EXTENDED_RELEASE_TABLET | Freq: Once | ORAL | Status: AC
  Administered 2022-12-18: 40 meq via ORAL
  Filled 2022-12-18: qty 2

## 2022-12-18 MED ORDER — BOOST / RESOURCE BREEZE PO LIQD CUSTOM
1.0000 | Freq: Three times a day (TID) | ORAL | Status: DC
  Administered 2022-12-18 – 2022-12-23 (×16): 1 via ORAL

## 2022-12-18 MED ORDER — PROSOURCE PLUS PO LIQD
30.0000 mL | Freq: Three times a day (TID) | ORAL | Status: DC
  Administered 2022-12-18 – 2022-12-23 (×12): 30 mL via ORAL
  Filled 2022-12-18 (×12): qty 30

## 2022-12-18 MED ORDER — SODIUM BICARBONATE 650 MG PO TABS
1950.0000 mg | ORAL_TABLET | Freq: Two times a day (BID) | ORAL | Status: DC
  Administered 2022-12-18 – 2022-12-23 (×12): 1950 mg via ORAL
  Filled 2022-12-18 (×12): qty 3

## 2022-12-18 MED ORDER — OXYCODONE HCL 5 MG PO TABS
5.0000 mg | ORAL_TABLET | Freq: Four times a day (QID) | ORAL | Status: DC | PRN
  Administered 2022-12-18: 10 mg via ORAL
  Administered 2022-12-18: 5 mg via ORAL
  Administered 2022-12-19 – 2022-12-22 (×5): 10 mg via ORAL
  Filled 2022-12-18 (×2): qty 2
  Filled 2022-12-18: qty 1
  Filled 2022-12-18 (×4): qty 2

## 2022-12-18 MED ORDER — POTASSIUM CHLORIDE CRYS ER 20 MEQ PO TBCR
40.0000 meq | EXTENDED_RELEASE_TABLET | ORAL | Status: AC
  Administered 2022-12-18 (×2): 40 meq via ORAL
  Filled 2022-12-18 (×2): qty 2

## 2022-12-18 NOTE — Evaluation (Signed)
Physical Therapy Evaluation Patient Details Name: Anne Anne [name] MRN: [id number] DOB: 1956-03-31 Today's Date: 12/18/2022  History of Present Illness  67 year old female who presents to the emergency department with weakness. She also reports a recent fall due to BLE weakness. Past medical history of RTA type 1, Crohn's disease of colon, CKD stage 3a, CAD, Lumbar OA, S/P lumbar laminectomy with long term opioid treatment, and myositis in 2021  Clinical Impression  Pt presents with admitting diagnosis above. Pt today was able to ambulation in room with RW at supervision level. Recommend pt resume OPPT upon DC. PT will continue to follow.       If plan is discharge home, recommend the following: A little help with walking and/or transfers;A little help with bathing/dressing/bathroom;Assistance with cooking/housework;Assist for transportation;Help with stairs or ramp for entrance   Can travel by private vehicle        Equipment Recommendations Rolling walker (2 wheels)  Recommendations for Other Services       Functional Status Assessment Patient has had a recent decline in their functional status and demonstrates the ability to make significant improvements in function in a reasonable and predictable amount of time.     Precautions / Restrictions Precautions Precautions: Fall Restrictions Weight Bearing Restrictions: No      Mobility  Bed Mobility Overal bed mobility: Needs Assistance Bed Mobility: Supine to Sit, Sit to Supine     Supine to sit: Supervision, Used rails, HOB elevated Sit to supine: Supervision, HOB elevated, Used rails        Transfers Overall transfer level: Needs assistance Equipment used: None Transfers: Sit to/from Stand Sit to Stand: Contact guard assist           General transfer comment: Strong forward lean before transition to full upright in standing    Ambulation/Gait Ambulation/Gait assistance: Supervision Gait Distance (Feet): 25  Feet Assistive device: Rolling walker (2 wheels) Gait Pattern/deviations: Decreased stride length, Step-through pattern, Trunk flexed Gait velocity: decreased     General Gait Details: no LOB noted. Pt only agreeable to room ambulation.  Stairs            Wheelchair Mobility     Tilt Bed    Modified Rankin (Stroke Patients Only)       Balance Overall balance assessment: Needs assistance Sitting-balance support: Feet supported Sitting balance-Leahy Scale: Fair     Standing balance support: No upper extremity supported, Bilateral upper extremity supported, During functional activity Standing balance-Leahy Scale: Fair Standing balance comment: static standing no AD                             Pertinent Vitals/Pain Pain Assessment Pain Assessment: 0-10 Pain Score: 10-Worst pain ever Pain Location: R hip region Pain Descriptors / Indicators: Aching, Anne [name], Grimacing, Guarding Pain Intervention(s): Monitored during session    Home Living Family/patient expects to be discharged to:: Private residence Living Arrangements: Other relatives (lives with sister) Available Help at Discharge: Family;Available PRN/intermittently Type of Home: Apartment (senior building) Home Access: Elevator       Home Layout: One level Home Equipment: Shower seat;Grab bars - tub/shower;Grab bars - toilet;Cane - single point Additional Comments: 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 Legs gave away    Prior Function Prior Level of Function : Independent/Modified Independent;History of Falls (last six months)             Mobility Comments: ind, using SPC recently  ADLs Comments: ind     Extremity/Trunk Assessment   Upper Extremity Assessment Upper Extremity Assessment: Generalized weakness    Lower Extremity Assessment Lower Extremity Assessment: Generalized weakness    Cervical / Trunk Assessment Cervical / Trunk Assessment: Kyphotic  Communication    Communication Communication: No apparent difficulties  Cognition Arousal: Alert Behavior During Therapy: WFL for tasks assessed/performed Overall Cognitive Status: Within Functional Limits for tasks assessed                                          General Comments General comments (skin integrity, edema, etc.): VSS on RA    Exercises     Assessment/Plan    PT Assessment Patient needs continued PT services  PT Problem List Decreased strength;Decreased range of motion;Decreased activity tolerance;Decreased balance;Decreased coordination;Decreased mobility;Decreased knowledge of use of DME;Decreased safety awareness;Pain       PT Treatment Interventions DME instruction;Gait training;Stair training;Functional mobility training;Therapeutic activities;Therapeutic exercise;Balance training;Neuromuscular re-education;Patient/family education    PT Goals (Current goals can be found in the Care Plan section)  Acute Rehab PT Goals Patient Stated Goal: to go home PT Goal Formulation: With patient Time For Goal Achievement: 01/01/23 Potential to Achieve Goals: Good    Frequency Min 1X/week     Co-evaluation               AM-PAC PT "6 Clicks" Mobility  Outcome Measure Help needed turning from your back to your side while in a flat bed without using bedrails?: A Little Help needed moving from lying on your back to sitting on the side of a flat bed without using bedrails?: A Little Help needed moving to and from a bed to a chair (including a wheelchair)?: A Little Help needed standing up from a chair using your arms (e.g., wheelchair or bedside chair)?: A Little Help needed to walk in hospital room?: A Little Help needed climbing 3-5 steps with a railing? : A Lot 6 Click Score: 17    End of Session Equipment Utilized During Treatment: Gait belt Activity Tolerance: Patient tolerated treatment well Patient left: in bed;with call bell/phone within reach Nurse  Communication: Mobility status PT Visit Diagnosis: Other abnormalities of gait and mobility (R26.89)    Time: 1308-6578 PT Time Calculation (min) (ACUTE ONLY): 19 min   Charges:   PT Evaluation $PT Eval Low Complexity: 1 Low   PT General Charges $$ ACUTE PT VISIT: 1 Visit         Shela Nevin, PT, DPT Acute Rehab Services [phone]   [name] Damme 12/18/2022, 3:51 PM

## 2022-12-18 NOTE — Evaluation (Signed)
Occupational Therapy Evaluation Patient Details Name: Anne [name] MRN: [id number] DOB: 09-20-1955 Today's Date: 12/18/2022   History of Present Illness 67 year old female who presents to the emergency department with weakness. She also reports a recent fall due to BLE weakness. Past medical history of RTA type 1, Crohn's disease of colon, CKD stage 3a, CAD, Lumbar OA, S/P lumbar laminectomy with long term opioid treatment, and myositis in 2021   Clinical Impression   Pt admitted for above, presents with generalized weakness UE/LE and R hip pain. Pt completing STS with CGA and seen transferring to recliner with NT with what visually appeared as CGA as well. Pt limited by hip pain to complete ADLs independently. Anticipate pt to progress once pain resolves, OOB mobility limited to standing for safety as a hip x ray was ordered just prior to OT session. OT to continue to follow pt acutely to help pt progress home, no follow-up OT recommended but pt reports she was going to start outpatient PT tomorrow and OT in agreeance that she receive some form of post acute PT.       If plan is discharge home, recommend the following: A little help with bathing/dressing/bathroom;Assistance with cooking/housework    Functional Status Assessment  Patient has had a recent decline in their functional status and demonstrates the ability to make significant improvements in function in a reasonable and predictable amount of time.  Equipment Recommendations  None recommended by OT (Pt has rec DME, handicap apartments)    Recommendations for Other Services       Precautions / Restrictions Precautions Precautions: Fall Restrictions Weight Bearing Restrictions: No      Mobility Bed Mobility               General bed mobility comments: Pt up in recliner upon arrival, left sitting in recliner    Transfers Overall transfer level: Needs assistance Equipment used: None Transfers: Sit to/from  Stand Sit to Stand: Contact guard assist           General transfer comment: Strong forward lean before transition to full upright in standing      Balance Overall balance assessment: Needs assistance Sitting-balance support: Feet supported Sitting balance-Leahy Scale: Fair Sitting balance - Comments: in recliner, not fully assessed     Standing balance-Leahy Scale: Fair Standing balance comment: static standing no AD                           ADL either performed or assessed with clinical judgement   ADL Overall ADL's : Needs assistance/impaired Eating/Feeding: Independent;Sitting   Grooming: Set up;Sitting   Upper Body Bathing: Set up;Sitting   Lower Body Bathing: Minimal assistance;Sitting/lateral leans   Upper Body Dressing : Independent;Sitting   Lower Body Dressing: Sitting/lateral leans;Moderate assistance   Toilet Transfer: Stand-pivot;Contact guard assist                   Vision         Perception         Praxis         Pertinent Vitals/Pain Pain Assessment Pain Assessment: 0-10 Pain Score: 10-Worst pain ever Pain Location: R hip region Pain Descriptors / Indicators: Aching, Sharp, Grimacing, Guarding Pain Intervention(s): Limited activity within patient's tolerance, Monitored during session, Repositioned     Extremity/Trunk Assessment Upper Extremity Assessment Upper Extremity Assessment: Generalized weakness   Lower Extremity Assessment Lower Extremity Assessment: Generalized weakness   Cervical /  Trunk Assessment Cervical / Trunk Assessment: Kyphotic   Communication Communication Communication: No apparent difficulties   Cognition Arousal: Alert Behavior During Therapy: WFL for tasks assessed/performed Overall Cognitive Status: Within Functional Limits for tasks assessed                                       General Comments  Pt reports the are going to get an xray on her R hip. Set pt up with  tray in front to eat lunch    Exercises     Shoulder Instructions      Home Living Family/patient expects to be discharged to:: Private residence Living Arrangements: Other relatives (lives with sister) Available Help at Discharge: Family;Available PRN/intermittently Type of Home: Apartment (senior building) Home Access: Elevator     Home Layout: One level     Bathroom Shower/Tub: Producer, television/film/video: Handicapped height Bathroom Accessibility: Yes How Accessible: Accessible via wheelchair Home Equipment: Shower seat;Grab bars - tub/shower;Grab bars - toilet   Additional Comments: 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 Legs gave away      Prior Functioning/Environment Prior Level of Function : Independent/Modified Independent;History of Falls (last six months)             Mobility Comments: ind, using SPC recently ADLs Comments: ind        OT Problem List: Decreased strength;Impaired balance (sitting and/or standing)      OT Treatment/Interventions: Self-care/ADL training;Balance training;Therapeutic exercise;Therapeutic activities;Patient/family education    OT Goals(Current goals can be found in the care plan section) Acute Rehab OT Goals Patient Stated Goal: TO go home OT Goal Formulation: With patient Time For Goal Achievement: 01/01/23 Potential to Achieve Goals: Good ADL Goals Pt Will Perform Grooming: with supervision;standing Pt Will Perform Lower Body Dressing: with supervision;sit to/from stand Pt Will Transfer to Toilet: with supervision;ambulating Pt Will Perform Tub/Shower Transfer: Tub transfer;with supervision  OT Frequency: Min 1X/week    Co-evaluation              AM-PAC OT "6 Clicks" Daily Activity     Outcome Measure Help from another person eating meals?: None Help from another person taking care of personal grooming?: A Little Help from another person toileting, which includes using toliet, bedpan, or  urinal?: A Little Help from another person bathing (including washing, rinsing, drying)?: A Little Help from another person to put on and taking off regular upper body clothing?: None Help from another person to put on and taking off regular lower body clothing?: A Lot 6 Click Score: 19   End of Session Equipment Utilized During Treatment: Gait belt Nurse Communication: Mobility status  Activity Tolerance: Patient tolerated treatment well Patient left: in chair;with call bell/phone within reach;with chair alarm set  OT Visit Diagnosis: Unsteadiness on feet (R26.81);Pain;Muscle weakness (generalized) (M62.81) Pain - Right/Left: Right Pain - part of body: Hip                Time: 1015-1027 OT Time Calculation (min): 12 min Charges:  OT General Charges $OT Visit: 1 Visit OT Evaluation $OT Eval Low Complexity: 1 Low  12/18/2022  AB, OTR/L  Acute Rehabilitation Services  Office: [phone]   Tristan Schroeder 12/18/2022, 12:08 PM

## 2022-12-18 NOTE — Progress Notes (Addendum)
Subjective Patient endorses right-sided groin pain and generalized LE muscle aches both unchanged since admission.   Physical exam Blood pressure (!) 83/59, pulse 71, temperature 97.9 F (36.6 C), temperature source Oral, resp. rate 17, weight 74.7 kg, last menstrual period 02/18/2001, SpO2 100%.  Physical Exam: Constitutional: well-appearing, lying in bed, in no acute distress Cardiovascular: regular rate and rhythm, no m/r/g Pulmonary/Chest: normal work of breathing on room air, lungs clear to auscultation bilaterally Abdominal: soft, non-tender, non-distended, no suprapubic tenderness MSK: normal bulk and tone, tenderness to palpation on right groin, 4/5 strength bilateral LE (more proximal weakness in nature) Skin: warm and dry Psych: normal mood and behavior   Weight change:    Intake/Output Summary (Last 24 hours) at 12/18/2022 1057 Last data filed at 12/18/2022 0900 Gross per 24 hour  Intake 3704.61 ml  Output 1750 ml  Net 1954.61 ml   Net IO Since Admission: 1,954.61 mL [12/18/22 1057]  Labs, images, and other studies    Latest Ref Rng & Units 12/18/2022    5:40 AM 12/17/2022    3:47 PM 11/30/2022   11:25 AM  CBC  WBC 4.0 - 10.5 K/uL 8.3  8.7  10.4   Hemoglobin 12.0 - 15.0 g/dL 16.1  09.6  04.5   Hematocrit 36.0 - 46.0 % 39.6  45.9  44.4   Platelets 150 - 400 K/uL 271  347  310        Latest Ref Rng & Units 12/18/2022    5:40 AM 12/17/2022   10:15 PM 12/17/2022    3:47 PM  BMP  Glucose 70 - 99 mg/dL 91  409  811   BUN 8 - 23 mg/dL 13  17  20    Creatinine 0.44 - 1.00 mg/dL 9.14  7.82  9.56   Sodium 135 - 145 mmol/L 140  137  134   Potassium 3.5 - 5.1 mmol/L 3.2  2.9  2.6   Chloride 98 - 111 mmol/L 112  108  104   CO2 22 - 32 mmol/L 20  22  19    Calcium 8.9 - 10.3 mg/dL 7.5  7.3  7.9      Assessment and plan Hospital day 0  Anne [name] is a 67 y.o.female with a PMH of RTA Type I, Crohn's, CKD stage 3a, CAD s/p stent 2019, Lumbar OA s/p lumbar  laminectomy with long term opioid treatment, and myositis in 2021 who presents to the emergency department 9/5 with weakness.   Principal Problem:   Non-traumatic rhabdomyolysis Active Problems:   Hypokalemia   Generalized weakness   Crohn's disease of colon with complication (HCC)   Chronic diarrhea   Abnormal LFTs  #Non-traumatic Rhabdomyolysis  #Hypokalemia #Prolonged QTc #RTA Type I Exact etiology unclear at this point. Patient had similar presentation in 2021 that was attributed to hypokalemia. Hypokalemia on this admission was 2.6. Today K+ is 3.2 (being repleted). Repeat ECG this morning with improvement in QTc to 489. AM Cortisol wnl. CK currently 14,656 (down from 19,024 on admission), LDH 659, AST and ALT down-trending to 408 and 330 respectively. Patient given LR bolus in ED and continues on LR infusion @ 200 ml/hr.  -Trend CK, BMP -Continue IVF -Continue NaHCO3 -Holding Crestor and Zetia  #Right Hip Pain Patient endorses non-radiating sharp pain of anterior groin/hip. Per chart review, patient with avascular necrosis of the right femoral head as noted on 2021 CTAP. Patient  states she fell while getting of the bus recently. -Order 2-3 view right hip X-ray   #Hypotension BP on softer side today with most recent being 83/59, MAP 67. Patient asymptomatic. Orthostatics pending. -Trend BP and consider IVF bolus vs. medication if BP remains low.   #Asymptomatic Urinary Tract Infection  UA on admission showed bacteria and pyuria. Patient asymptomatic and has history of being chronic colonizer. -Received one dose of ceftriaxone in ED that was later d/c by IMTS.  -Today patient continues to deny any urinary symptoms.  #Acute kidney injury superimposed on CKD stage 3a Resolved Creatinine improved this morning to 1.27 which is at baseline. Bladder scan/post residual today unremarkable.  #Lumbar OA #S/P lumbar laminectomy with long term opioid treatment -Continue home regimen  with oxycodone 10mg  2 tablets, BID  #Crohn's Disease of Colon  Patient with chronic diarrhea but denies recent flares. Follows with GI and receives Remicade and Infliximab q 2 months.  #Elevated aPTT/INR aPTT > 200 and INR 2.6. Etiology is likely in the setting of dilution from IVF.  Smear unremarkable. Patient denies bleeding. -Repeating both on non-IV side   #CAD #STEMI s/p stent placement 2019 -Encourage tobacco cessation outpatient  -Continue Aspirin 81mg  -Holding statin and Zetia   Diet: Renal with fluid restriction IVF: LR @ 200 ml/hr VTE: enoxaparin (LOVENOX) injection 40 mg Start: 12/17/22 2015  Code: Full PT/OT recommendations: OT with no follow-up recommendations at this time. TOC recommendations: N/A Family Update: N/A  Discharge plan: Pending further evaluation   Anne Miller, DO 12/18/2022, 10:57 AM  Pager: [phone] After 5pm or weekend: [phone]

## 2022-12-18 NOTE — Progress Notes (Signed)
Initial Nutrition Assessment  DOCUMENTATION CODES:   Not applicable  INTERVENTION:  Boost Breeze po TID, each supplement provides 250 kcal and 9 grams of protein  Prosource Plus PO TID, each supplement provides 100 kcal, 15 g of protein   Liberalize diet to regular, remove 1200 ml FR -MD approved   NUTRITION DIAGNOSIS:   Inadequate oral intake related to altered GI function as evidenced by per patient/family report, meal completion < 50%.  GOAL:   Patient will meet greater than or equal to 90% of their needs  MONITOR:   PO intake, Supplement acceptance, Labs, Weight trends, Skin, I & O's  REASON FOR ASSESSMENT:   Consult Assessment of nutrition requirement/status, Other (Comment) (hx of Crohn's)  ASSESSMENT:   67 y.o. female with PMHx including RTA, Crohn's disease, CKD 3a, CAD, lumbar OA, s/p lumbar lamniectomy with long term opioid treatment and myositis in 2021 who presents with BLE weakness associated with bilateral feet and hand cramping  Visited patient at bedside who reports no appetite and that she typically drinks black coffee for breakfast and iced tea with lemon throughout the day, and that she snacks on vanilla pudding, fruit, vanilla wafers and few other things during the day. Patient reports she used to get her protein from chicken but she does not like it anymore and will eat seafood instead, but it did not sound like she does this on a daily basis. Patient has poor po intake due to bowel incontinence especially at night. 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 Patient reports her MD told her to reduce her Gabapentin dose from TID to once daily due to liver distress. Patient reports since her medication was reduced, she started to feel very weak and tired to where her sister was taking care of her.   Since she has been admitted, she has not been eating well. RD observed lunch tray which patient had only eaten bites of. She admits to not liking the food.  RD reached out to Attending to liberalize diet. Patient was always curious as to why she was on a fluid restriction which RD asked about as well. She reports her son bought her a big gallon container to help her drink more water. MD agreeable to liberalize diet and remove FR.   Trigger foods reported to be: gravy, sauces, fried foods   Labs: CBG 119, K+ 3.2, Cr 1.27, AST 408, ALT 330  Meds; sodium bicarbonate, NS, lactated ringers   Wt:  12/18/22 74.7 kg  11/26/22 73.4 kg  10/23/22 72.8 kg  10/09/22 70.6 kg  07/01/22 72.6 kg  06/05/22 71.2 kg   Po: 100% c 1 documented meal   I/O's: +1.6 L (net cumulative)    NUTRITION - FOCUSED PHYSICAL EXAM:  Flowsheet Row Most Recent Value  Orbital Region No depletion  Upper Arm Region Mild depletion  Thoracic and Lumbar Region Unable to assess  Buccal Region Mild depletion  Temple Region Mild depletion  Clavicle Bone Region Moderate depletion  Clavicle and Acromion Bone Region Moderate depletion  Scapular Bone Region Unable to assess  Dorsal Hand No depletion  Patellar Region No depletion  Anterior Thigh Region No depletion  Posterior Calf Region No depletion  Edema (RD Assessment) None  Hair Reviewed  Eyes Reviewed  Mouth Reviewed  Skin Reviewed  Nails Reviewed       Diet Order:   Diet Order             Diet regular Room  service appropriate? Yes; Fluid consistency: Thin  Diet effective now                   EDUCATION NEEDS:   Education needs have been addressed  Skin:  Skin Assessment: Reviewed RN Assessment  Last BM:  unknown  Height:   Ht Readings from Last 1 Encounters:  10/23/22 5\' 9"  (1.753 m)    Weight:   Wt Readings from Last 1 Encounters:  12/18/22 74.7 kg    Ideal Body Weight:     BMI:  Body mass index is 24.32 kg/m.  Estimated Nutritional Needs:   Kcal:  1610-9604  Protein:  90-115 g  Fluid:  >/= 2L    Leodis Rains, RDN, LDN  Clinical Nutrition

## 2022-12-19 DIAGNOSIS — M25551 Pain in right hip: Secondary | ICD-10-CM | POA: Diagnosis not present

## 2022-12-19 DIAGNOSIS — E876 Hypokalemia: Secondary | ICD-10-CM | POA: Diagnosis not present

## 2022-12-19 DIAGNOSIS — M6282 Rhabdomyolysis: Secondary | ICD-10-CM | POA: Diagnosis not present

## 2022-12-19 LAB — CBC
HCT: 38.5 % (ref 36.0–46.0)
Hemoglobin: 12.7 g/dL (ref 12.0–15.0)
MCH: 30 pg (ref 26.0–34.0)
MCHC: 33 g/dL (ref 30.0–36.0)
MCV: 91 fL (ref 80.0–100.0)
Platelets: 274 10*3/uL (ref 150–400)
RBC: 4.23 MIL/uL (ref 3.87–5.11)
RDW: 17 % — ABNORMAL HIGH (ref 11.5–15.5)
WBC: 8.9 10*3/uL (ref 4.0–10.5)
nRBC: 0 % (ref 0.0–0.2)

## 2022-12-19 LAB — COMPREHENSIVE METABOLIC PANEL
ALT: 280 U/L — ABNORMAL HIGH (ref 0–44)
AST: 296 U/L — ABNORMAL HIGH (ref 15–41)
Albumin: 2.2 g/dL — ABNORMAL LOW (ref 3.5–5.0)
Alkaline Phosphatase: 83 U/L (ref 38–126)
Anion gap: 10 (ref 5–15)
BUN: 11 mg/dL (ref 8–23)
CO2: 16 mmol/L — ABNORMAL LOW (ref 22–32)
Calcium: 7.8 mg/dL — ABNORMAL LOW (ref 8.9–10.3)
Chloride: 114 mmol/L — ABNORMAL HIGH (ref 98–111)
Creatinine, Ser: 1.15 mg/dL — ABNORMAL HIGH (ref 0.44–1.00)
GFR, Estimated: 52 mL/min — ABNORMAL LOW (ref >60)
Glucose, Bld: 89 mg/dL (ref 70–99)
Potassium: 3.4 mmol/L — ABNORMAL LOW (ref 3.5–5.1)
Sodium: 140 mmol/L (ref 135–145)
Total Bilirubin: 0.5 mg/dL (ref 0.3–1.2)
Total Protein: 5.3 g/dL — ABNORMAL LOW (ref 6.5–8.1)

## 2022-12-19 LAB — CK: Total CK: 14421 U/L — ABNORMAL HIGH (ref 38–234)

## 2022-12-19 MED ORDER — POTASSIUM CHLORIDE CRYS ER 20 MEQ PO TBCR
40.0000 meq | EXTENDED_RELEASE_TABLET | Freq: Two times a day (BID) | ORAL | Status: AC
  Administered 2022-12-19 (×2): 40 meq via ORAL
  Filled 2022-12-19 (×2): qty 2

## 2022-12-19 NOTE — Progress Notes (Signed)
                 Interval history Weakness feels like it is improving.  Wonders about recent medication changes as a cause of her weakness.  Otherwise no new or acute concerns.  Physical exam Blood pressure 97/64, pulse 72, temperature 98.2 F (36.8 C), temperature source Oral, resp. rate 16, weight 74.7 kg, last menstrual period 02/18/2001, SpO2 99%.  No distress Heart rate is normal, rhythm is regular, radial pulses are strong, no lower extremity edema Breathing is regular and unlabored on room air Skin is warm and dry No calf or thigh muscle tenderness Alert and oriented Pleasant and congruent affect  Labs, images, and other studies Stable creatinine Persistent, mild hypokalemia Downtrending AST/ALT CK essentially stable from yesterday  Assessment and plan Hospital day 1  Anne [name] is a 67 y.o. admitted for nontraumatic rhabdomyolysis and hypokalemia.  Principal Problem:   Non-traumatic rhabdomyolysis Active Problems:   Hypokalemia   Generalized weakness   Crohn's disease of colon with complication (HCC)   Chronic diarrhea   Abnormal LFTs   Rhabdomyolysis  Nontraumatic rhabdomyolysis with AKI Hypokalemia Question RTA type I Overall clinically stable.  AKI resolved.  Liver enzymes improving.  CK stable today.  Tolerating the IV fluid infusion well without side effects of hypervolemia.  Prior workup for similar problem notable for paucity of muscle inflammation but patchy focal necrosis on muscle biopsy.  She was hypokalemic then, workup included urine tests notable for urine pH 6, urine anion gap 25, urine osmolal gap 100. - Increase LR infusion to 300 mL/h - Continue sodium bicarb - Maintain potassium >4 - Trend CK and CMP - Hold rosuvastatin and ezetimibe  Chronic right hip pain Lumbar spine osteoarthritis - Continue home oxycodone  Coronary artery disease Status post STEMI with stent in 2019. - Continue aspirin 81 mg daily - Holding lipid  medicines  Other chronic problems: Crohn's disease Chronic diarrhea  Diet: Regular IVF: LR at 300 mL/h VTE: enoxaparin (LOVENOX) injection 40 mg Start: 12/17/22 2015  Code: Full PT/OT recommendations: Outpatient PT TOC recommendations: N/A Family Update: N/A  Discharge plan: Pending resolution of rhabdomyolysis  Marrianne Mood MD 12/19/2022, 6:27 AM  Pager: [phone] After 5pm or weekend: [phone]

## 2022-12-19 NOTE — Plan of Care (Signed)

## 2022-12-20 LAB — COMPREHENSIVE METABOLIC PANEL
ALT: 266 U/L — ABNORMAL HIGH (ref 0–44)
AST: 281 U/L — ABNORMAL HIGH (ref 15–41)
Albumin: 2.2 g/dL — ABNORMAL LOW (ref 3.5–5.0)
Alkaline Phosphatase: 79 U/L (ref 38–126)
Anion gap: 9 (ref 5–15)
BUN: 14 mg/dL (ref 8–23)
CO2: 19 mmol/L — ABNORMAL LOW (ref 22–32)
Calcium: 8.3 mg/dL — ABNORMAL LOW (ref 8.9–10.3)
Chloride: 113 mmol/L — ABNORMAL HIGH (ref 98–111)
Creatinine, Ser: 1.25 mg/dL — ABNORMAL HIGH (ref 0.44–1.00)
GFR, Estimated: 47 mL/min — ABNORMAL LOW (ref >60)
Glucose, Bld: 91 mg/dL (ref 70–99)
Potassium: 4 mmol/L (ref 3.5–5.1)
Sodium: 141 mmol/L (ref 135–145)
Total Bilirubin: 0.5 mg/dL (ref 0.3–1.2)
Total Protein: 5.3 g/dL — ABNORMAL LOW (ref 6.5–8.1)

## 2022-12-20 LAB — CK: Total CK: 13295 U/L — ABNORMAL HIGH (ref 38–234)

## 2022-12-20 NOTE — Progress Notes (Signed)
   12/19/22 1544  Mobility  Activity Refused mobility  Mobility Specialist Start Time (ACUTE ONLY) 1540  Mobility Specialist Stop Time (ACUTE ONLY) 1542  Mobility Specialist Time Calculation (min) (ACUTE ONLY) 2 min   Mobility Specialist: Progress Note  Pt refused mobility session d/t ambulating all day - received in bed. Pt left in bed with all needs met - call bell within reach.   Barnie Mort, BS Mobility Specialist Please contact via SecureChat or Rehab office at [phone].

## 2022-12-20 NOTE — Plan of Care (Signed)

## 2022-12-20 NOTE — Progress Notes (Signed)
                 Subjective Patient feels well overall and LE weakness is improving.   Physical exam Blood pressure 99/64, pulse 77, temperature 98 F (36.7 C), temperature source Oral, resp. rate 17, weight 80.9 kg, last menstrual period 02/18/2001, SpO2 100%.  Physical Exam: Constitutional: well-appearing, lying in bed, in no acute distress Cardiovascular: regular rate and rhythm, no m/r/g, no LEE Pulmonary/Chest: normal work of breathing on room air, lungs clear to auscultation bilaterally MSK: normal bulk and tone, 4/5 strength bilateral LE Skin: warm and dry Psych: normal mood and behavior   Weight change: 6.2 kg   Intake/Output Summary (Last 24 hours) at 12/20/2022 1524 Last data filed at 12/20/2022 1116 Gross per 24 hour  Intake --  Output 2400 ml  Net -2400 ml   Net IO Since Admission: -2,002.39 mL [12/20/22 1524]  Labs, images, and other studies    Latest Ref Rng & Units 12/20/2022    2:30 AM 12/19/2022    6:21 AM 12/18/2022    5:40 AM  BMP  Glucose 70 - 99 mg/dL 91  89  91   BUN 8 - 23 mg/dL 14  11  13    Creatinine 0.44 - 1.00 mg/dL 2.95  2.84  1.32   Sodium 135 - 145 mmol/L 141  140  140   Potassium 3.5 - 5.1 mmol/L 4.0  3.4  3.2   Chloride 98 - 111 mmol/L 113  114  112   CO2 22 - 32 mmol/L 19  16  20    Calcium 8.9 - 10.3 mg/dL 8.3  7.8  7.5      Assessment and plan Hospital day 3  Anne [name] is a 67 y.o.female with a PMH of RTA Type I, Crohn's, CKD stage 3a, CAD s/p stent 2019, Lumbar OA s/p lumbar laminectomy with long term opioid treatment, and myositis in 2021 who presents to the emergency department 9/5 with weakness.     Principal Problem:   Non-traumatic rhabdomyolysis Active Problems:   Hypokalemia   Generalized weakness   Crohn's disease of colon with complication (HCC)   Chronic diarrhea   Abnormal LFTs   Rhabdomyolysis  #Non-traumatic Rhabdomyolysis  #Hypokalemia #Prolonged QTc #RTA Type I Exact etiology unclear at this point.  Patient had similar presentation in 2021 that was attributed to hypokalemia. Hypokalemia on this admission was 2.6. Today K+ is 4.0. CK currently 13,295 (down from 14,421 yesterday), AST and ALT down-trending to 296 and 280 respectively. -Trend CK, BMP -Continue LR @ 300 ml/hr -Continue NaHCO3 -Holding Crestor and Zetia  #CAD #STEMI s/p stent placement 2019 -Encourage tobacco cessation outpatient  -Continue Aspirin 81mg  -Holding statin and Zetia   #Chronic Right Hip Pain #Lumbar spine OA -Continue home Oxycodone   #Acute kidney injury superimposed on CKD stage 3a Resolved   #Crohn's Disease of Colon  Follows with GI and receives Remicade and Infliximab q 2 months.    Diet: Regular IVF: LR @ 300 ml/hr VTE: enoxaparin (LOVENOX) injection 40 mg Start: 12/17/22 2015  Code: Full PT/OT recommendations: OT with no follow-up recommendations at this time. TOC recommendations: N/A Family Update: N/A   Discharge plan: Pending further improvement    Carmina Miller, DO 12/20/2022, 3:24 PM  Pager: [phone] After 5pm or weekend: [phone]

## 2022-12-20 NOTE — Progress Notes (Signed)
   12/20/22 1320  Mobility  Activity Refused mobility  Mobility Specialist Start Time (ACUTE ONLY) 1315  Mobility Specialist Stop Time (ACUTE ONLY) 1317  Mobility Specialist Time Calculation (min) (ACUTE ONLY) 2 min   Mobility Specialist: Progress Note  Pt refused mobility session d/t her mobilizing all morning. Pt stated she has been doing her exercises and that MS to come back tomorrow. MS asked to check in later, pt denied. - Received and left in bed with all needs met. Call bell within reach.   Barnie Mort, BS Mobility Specialist Please contact via SecureChat or Rehab office at [phone].

## 2022-12-21 DIAGNOSIS — I4581 Long QT syndrome: Secondary | ICD-10-CM | POA: Diagnosis not present

## 2022-12-21 DIAGNOSIS — M6282 Rhabdomyolysis: Secondary | ICD-10-CM | POA: Diagnosis not present

## 2022-12-21 DIAGNOSIS — E876 Hypokalemia: Secondary | ICD-10-CM | POA: Diagnosis not present

## 2022-12-21 DIAGNOSIS — N2589 Other disorders resulting from impaired renal tubular function: Secondary | ICD-10-CM | POA: Diagnosis not present

## 2022-12-21 LAB — COMPREHENSIVE METABOLIC PANEL
ALT: 227 U/L — ABNORMAL HIGH (ref 0–44)
AST: 209 U/L — ABNORMAL HIGH (ref 15–41)
Albumin: 2.2 g/dL — ABNORMAL LOW (ref 3.5–5.0)
Alkaline Phosphatase: 80 U/L (ref 38–126)
Anion gap: 5 (ref 5–15)
BUN: 14 mg/dL (ref 8–23)
CO2: 18 mmol/L — ABNORMAL LOW (ref 22–32)
Calcium: 7.9 mg/dL — ABNORMAL LOW (ref 8.9–10.3)
Chloride: 111 mmol/L (ref 98–111)
Creatinine, Ser: 1.02 mg/dL — ABNORMAL HIGH (ref 0.44–1.00)
GFR, Estimated: 60 mL/min — ABNORMAL LOW (ref >60)
Glucose, Bld: 110 mg/dL — ABNORMAL HIGH (ref 70–99)
Potassium: 3.3 mmol/L — ABNORMAL LOW (ref 3.5–5.1)
Sodium: 134 mmol/L — ABNORMAL LOW (ref 135–145)
Total Bilirubin: 0.5 mg/dL (ref 0.3–1.2)
Total Protein: 5.4 g/dL — ABNORMAL LOW (ref 6.5–8.1)

## 2022-12-21 LAB — CHLORIDE, URINE, RANDOM: Chloride Urine: 96 mmol/L

## 2022-12-21 LAB — NA AND K (SODIUM & POTASSIUM), RAND UR
Potassium Urine: 10 mmol/L
Sodium, Ur: 115 mmol/L

## 2022-12-21 LAB — OSMOLALITY, URINE: Osmolality, Ur: 286 mosm/kg — ABNORMAL LOW (ref 300–900)

## 2022-12-21 LAB — CK: Total CK: 9834 U/L — ABNORMAL HIGH (ref 38–234)

## 2022-12-21 LAB — GLUCOSE, CAPILLARY: Glucose-Capillary: 98 mg/dL (ref 70–99)

## 2022-12-21 NOTE — Progress Notes (Signed)
Occupational Therapy Treatment Patient Details Name: Anne [name] MRN: [id number] DOB: 12/25/1955 Today's Date: 12/21/2022   History of present illness 67 year old female who presents to the emergency department with weakness. She also reports a recent fall due to BLE weakness. Past medical history of RTA type 1, Crohn's disease of colon, CKD stage 3a, CAD, Lumbar OA, S/P lumbar laminectomy with long term opioid treatment, and myositis in 2021   OT comments  Pt with complaint of tightness in LEs. Ambulated to bathroom and completed toileting and standing grooming with supervision. Pt managed her own IV pole. Continues to make steady gains. Do not anticipate pt will need post acute OT.       If plan is discharge home, recommend the following:  A little help with bathing/dressing/bathroom;Assistance with cooking/housework   Equipment Recommendations  None recommended by OT    Recommendations for Other Services      Precautions / Restrictions Precautions Precautions: Fall       Mobility Bed Mobility Overal bed mobility: Modified Independent             General bed mobility comments: HOB up    Transfers Overall transfer level: Modified independent Equipment used: None                     Balance   Sitting-balance support: Feet supported Sitting balance-Leahy Scale: Good       Standing balance-Leahy Scale: Fair                             ADL either performed or assessed with clinical judgement   ADL Overall ADL's : Needs assistance/impaired     Grooming: Wash/dry hands;Standing;Supervision/safety;Oral care           Upper Body Dressing : Independent;Sitting       Toilet Transfer: Supervision/safety;Ambulation;Comfort height toilet Toilet Transfer Details (indicate cue type and reason): pushed IV pole Toileting- Clothing Manipulation and Hygiene: Modified independent;Sitting/lateral lean       Functional mobility during ADLs:  Supervision/safety (pushed IV pole)      Extremity/Trunk Assessment              Vision       Perception     Praxis      Cognition Arousal: Alert Behavior During Therapy: WFL for tasks assessed/performed Overall Cognitive Status: Within Functional Limits for tasks assessed                                          Exercises      Shoulder Instructions       General Comments      Pertinent Vitals/ Pain       Pain Assessment Pain Assessment: Faces Faces Pain Scale: Hurts a little bit Pain Location: LEs Pain Descriptors / Indicators: Tightness Pain Intervention(s): Monitored during session, Repositioned  Home Living                                          Prior Functioning/Environment              Frequency  Min 1X/week        Progress Toward Goals  OT Goals(current goals can now be found in the care  plan section)  Progress towards OT goals: Progressing toward goals  Acute Rehab OT Goals OT Goal Formulation: With patient Time For Goal Achievement: 01/01/23 Potential to Achieve Goals: Good  Plan      Co-evaluation                 AM-PAC OT "6 Clicks" Daily Activity     Outcome Measure   Help from another person eating meals?: None Help from another person taking care of personal grooming?: A Little Help from another person toileting, which includes using toliet, bedpan, or urinal?: A Little Help from another person bathing (including washing, rinsing, drying)?: A Little Help from another person to put on and taking off regular upper body clothing?: None Help from another person to put on and taking off regular lower body clothing?: A Little 6 Click Score: 20    End of Session Equipment Utilized During Treatment: Gait belt  OT Visit Diagnosis: Unsteadiness on feet (R26.81);Pain;Muscle weakness (generalized) (M62.81)   Activity Tolerance Patient tolerated treatment well   Patient Left in  bed;with call bell/phone within reach;with bed alarm set   Nurse Communication          Time: 1510-1530 OT Time Calculation (min): 20 min  Charges: OT General Charges $OT Visit: 1 Visit OT Treatments $Self Care/Home Management : 8-22 mins  [name], OTR/L Acute Rehabilitation Services Office: [phone]  [name] 12/21/2022, 4:04 PM

## 2022-12-21 NOTE — Plan of Care (Signed)
  Problem: Education: Goal: Knowledge of General Education information will improve Description: Including pain rating scale, medication(s)/side effects and non-pharmacologic comfort measures Outcome: Progressing   Problem: Health Behavior/Discharge Planning: Goal: Ability to manage health-related needs will improve Outcome: Progressing   Problem: Clinical Measurements: Goal: Ability to maintain clinical measurements within normal limits will improve Outcome: Progressing Goal: Will remain free from infection Outcome: Progressing Goal: Diagnostic test results will improve Outcome: Progressing Goal: Respiratory complications will improve Outcome: Progressing Goal: Cardiovascular complication will be avoided Outcome: Progressing   Problem: Activity: Goal: Risk for activity intolerance will decrease Outcome: Progressing   Problem: Nutrition: Goal: Adequate nutrition will be maintained Outcome: Progressing   Problem: Coping: Goal: Level of anxiety will decrease Outcome: Progressing   Problem: Elimination: Goal: Will not experience complications related to bowel motility Outcome: Progressing Goal: Will not experience complications related to urinary retention Outcome: Progressing   Problem: Pain Managment: Goal: General experience of comfort will improve Outcome: Progressing   Problem: Safety: Goal: Ability to remain free from injury will improve Outcome: Progressing   Problem: Skin Integrity: Goal: Risk for impaired skin integrity will decrease Outcome: Progressing   Problem: Education: Goal: Ability to verbalize activity precautions or restrictions will improve Outcome: Progressing Goal: Knowledge of the prescribed therapeutic regimen will improve Outcome: Progressing Goal: Understanding of discharge needs will improve Outcome: Progressing   Problem: Activity: Goal: Ability to avoid complications of mobility impairment will improve Outcome: Progressing Goal:  Ability to tolerate increased activity will improve Outcome: Progressing Goal: Will remain free from falls Outcome: Progressing   Problem: Bowel/Gastric: Goal: Gastrointestinal status for postoperative course will improve Outcome: Progressing   Problem: Clinical Measurements: Goal: Ability to maintain clinical measurements within normal limits will improve Outcome: Progressing Goal: Postoperative complications will be avoided or minimized Outcome: Progressing Goal: Diagnostic test results will improve Outcome: Progressing   Problem: Pain Management: Goal: Pain level will decrease Outcome: Progressing   Problem: Skin Integrity: Goal: Will show signs of wound healing Outcome: Progressing   Problem: Health Behavior/Discharge Planning: Goal: Identification of resources available to assist in meeting health care needs will improve Outcome: Progressing   Problem: Bladder/Genitourinary: Goal: Urinary functional status for postoperative course will improve Outcome: Progressing  Shonda Mandarino Tamera Stands, RN

## 2022-12-21 NOTE — Plan of Care (Signed)

## 2022-12-21 NOTE — TOC Initial Note (Addendum)
Transition of Care [facility]) - Initial/Assessment Note    Patient Details  Name: Anne [name] MRN: [id number] Date of Birth: [birth year]  [facility]) CM/SW Contact:    Kingsley Plan, RN Phone Number: 12/21/2022, 12:50 PM  Clinical Narrative:                  Spoke to patient at bedside. Confirmed face sheet information. Patient from home with son.   MD ordered OP PT at [facility]. Patient states she has not started yet .   Patient will need walker for home. Ordered with [employer]   Expected Discharge Plan: Home/Self Care Barriers to Discharge: Continued Medical Work up   Patient Goals and CMS Choice Patient states their goals for this hospitalization and ongoing recovery are:: to return to home          Expected Discharge Plan and Services   Discharge Planning Services: CM Consult Post Acute Care Choice: Durable Medical Equipment Living arrangements for the past 2 months: Single Family Home                 DME Arranged: Walker rolling DME Agency: [employer] Date DME Agency Contacted: 12/21/22 Time DME Agency Contacted: 1249 Representative spoke with at DME Agency: Mitch HH Arranged: NA          Prior Living Arrangements/Services Living arrangements for the past 2 months: Single Family Home Lives with:: Adult Children Patient language and need for interpreter reviewed:: Yes Do you feel safe going back to the place where you live?: Yes      Need for Family Participation in Patient Care: Yes (Comment) Care giver support system in place?: Yes (comment)   Criminal Activity/Legal Involvement Pertinent to Current Situation/Hospitalization: No - Comment as needed  Activities of Daily Living Home Assistive Devices/Equipment: Walker (specify type) ADL Screening (condition at time of admission) Patient's cognitive ability adequate to safely complete daily activities?: Yes Is the patient deaf or have difficulty hearing?: No Does the patient have  difficulty seeing, even when wearing glasses/contacts?: No Does the patient have difficulty concentrating, remembering, or making decisions?: No Patient able to express need for assistance with ADLs?: Yes Does the patient have difficulty dressing or bathing?: No Independently performs ADLs?: Yes (appropriate for developmental age) Does the patient have difficulty walking or climbing stairs?: Yes Weakness of Legs: Both Weakness of Arms/Hands: None  Permission Sought/Granted   Permission granted to share information with : No              Emotional Assessment Appearance:: Appears stated age Attitude/Demeanor/Rapport: Engaged Affect (typically observed): Accepting Orientation: : Oriented to Self, Oriented to Place, Oriented to  Time, Oriented to Situation Alcohol / Substance Use: Not Applicable Psych Involvement: No (comment)  Admission diagnosis:  Hypokalemia [E87.6] Rhabdomyolysis [M62.82] Patient Active Problem List   Diagnosis Date Noted   Non-traumatic rhabdomyolysis 12/18/2022   Rhabdomyolysis 12/18/2022   Chronic low blood pressure 11/26/2022   Back pain 11/26/2022   Encounter for smoking cessation counseling 11/26/2022   Health care maintenance 11/26/2022   Stage 3b chronic kidney disease (HCC) 10/09/2022   S/P lumbar laminectomy 06/05/2022   Spinal stenosis of lumbar region with neurogenic claudication 05/22/2022   S/P cervical spinal fusion 01/02/2022   Mixed hyperlipidemia 02/02/2020   Abnormal LFTs 02/02/2020   Acute diarrhea 02/02/2020   Lactic acidosis 02/01/2020   Crohn's disease of colon with complication (HCC) 02/01/2020   Chronic diarrhea 02/01/2020   Coronary artery disease involving native coronary  artery of native heart without angina pectoris 02/01/2020   Severe protein-calorie malnutrition (HCC) 08/08/2019   Hypokalemia 08/04/2019   AKI (acute kidney injury) (HCC)    Generalized weakness    Prolonged Q-T interval on ECG    Coronary syndrome,  acute (HCC) 04/25/2018   STEMI (ST elevation myocardial infarction) (HCC) 10/26/2017   STEMI involving right coronary artery (HCC) 10/26/2017   PCP:  Philomena Doheny, MD Pharmacy:   [facility] [phone] - [place], [place] - [address] AT [facility] [address] & [facility] [address] [place] [address] Phone: [phone] Fax: [phone]  [facility] [id number] - [place], [place] - [address] [place]  [address] Phone: [phone] Fax: [phone]     Social Determinants of Health (SDOH) Social History: SDOH Screenings   Food Insecurity: No Food Insecurity (12/17/2022)  Housing: Low Risk  (12/17/2022)  Transportation Needs: No Transportation Needs (12/17/2022)  Utilities: Not At Risk (12/17/2022)  Social Connections: Unknown (04/07/2022)   Received from [facility]  Tobacco Use: Medium Risk (12/17/2022)   SDOH Interventions:     Readmission Risk Interventions     No data to display

## 2022-12-21 NOTE — Progress Notes (Signed)
Subjective Patient endorses bilateral leg tightness that has increased since yesterday, but states weakness has improved. Denies numbness/tingling changes different than baseline. No pain.   Physical exam Blood pressure 100/68, pulse 77, temperature 98.6 F (37 C), resp. rate 16, weight 80.9 kg, last menstrual period 02/18/2001, SpO2 99%.  Constitutional: well-appearing, sitting in chair eating, in no acute distress Cardiovascular: regular rate and rhythm, no m/r/g Pulmonary/Chest: normal work of breathing on room air, lungs clear to auscultation bilaterally MSK: normal bulk and tone, bilateral LE weakness improved significantly since admission Skin: warm and dry, bilateral LE taught, non-pitting Psych: normal mood and behavior   Weight change:    Intake/Output Summary (Last 24 hours) at 12/21/2022 1428 Last data filed at 12/21/2022 1212 Gross per 24 hour  Intake 5218.84 ml  Output 3900 ml  Net 1318.84 ml   Net IO Since Admission: -246.55 mL [12/21/22 1428]  Labs, images, and other studies    Latest Ref Rng & Units 12/20/2022    2:30 AM 12/19/2022    6:21 AM 12/18/2022    5:40 AM  CMP  Glucose 70 - 99 mg/dL 91  89  91   BUN 8 - 23 mg/dL 14  11  13    Creatinine 0.44 - 1.00 mg/dL 1.30  8.65  7.84   Sodium 135 - 145 mmol/L 141  140  140   Potassium 3.5 - 5.1 mmol/L 4.0  3.4  3.2   Chloride 98 - 111 mmol/L 113  114  112   CO2 22 - 32 mmol/L 19  16  20    Calcium 8.9 - 10.3 mg/dL 8.3  7.8  7.5   Total Protein 6.5 - 8.1 g/dL 5.3  5.3    Total Bilirubin 0.3 - 1.2 mg/dL 0.5  0.5    Alkaline Phos 38 - 126 U/L 79  83    AST 15 - 41 U/L 281  296    ALT 0 - 44 U/L 266  280        Assessment and plan Hospital day 4  KEN OTOOLE is a 67 y.o.female with a PMH of RTA Type I, Crohn's, CKD stage 3a, CAD s/p stent 2019, Lumbar OA s/p lumbar laminectomy with long term opioid treatment, and myositis in 2021 who presents to the emergency department 9/5 with weakness.      Principal Problem:   Non-traumatic rhabdomyolysis Active Problems:   Hypokalemia   Generalized weakness   Crohn's disease of colon with complication (HCC)   Chronic diarrhea   Abnormal LFTs   Rhabdomyolysis  #Non-traumatic Rhabdomyolysis  #Hypokalemia #Prolonged QTc #RTA Type I Exact etiology unclear at this point. Patient had similar presentation in 2021 that was attributed to hypokalemia. Hypokalemia on this admission was 2.6. CK currently 9,834 (13,295 yesterday). AST and ALT down-trended yesterday to 296 and 280 respectively. Repeat CMP pending. Of note, there is an association with Infliximab and hypokalemia through case reports. -Trend CK, CMP -LR reduced from 300 to 150 ml/hr -Continue NaHCO3 -Holding Crestor and Zetia   #CAD #STEMI s/p stent placement 2019 -Encourage tobacco cessation outpatient  -Continue Aspirin 81mg  -Holding statin and Zetia   #Chronic Right Hip Pain #Lumbar spine OA -Continue home Oxycodone   #Acute kidney injury superimposed on CKD stage 3a Resolved   #Crohn's Disease of Colon  Follows with GI and receives Infliximab q 2 months.     Diet: Regular IVF: LR @  150 ml/hr VTE: enoxaparin (LOVENOX) injection 40 mg Start: 12/17/22 2015  Code: Full PT/OT recommendations: OT with no follow-up recommendations at this time. TOC recommendations: N/A Family Update: N/A   Discharge plan: Most likely tomorrow with continued downtrend in CK  Carmina Miller, DO 12/21/2022, 2:28 PM  Pager: [phone] After 5pm or weekend: [phone]

## 2022-12-21 NOTE — Plan of Care (Signed)

## 2022-12-21 NOTE — Progress Notes (Signed)
Physical Therapy Treatment Patient Details Name: Anne [name] MRN: [id number] DOB: June 12, 1955 Today's Date: 12/21/2022   History of Present Illness 67 year old female who presents to the emergency department with weakness. She also reports a recent fall due to BLE weakness. Past medical history of RTA type 1, Crohn's disease of colon, CKD stage 3a, CAD, Lumbar OA, S/P lumbar laminectomy with long term opioid treatment, and myositis in 2021    PT Comments  Pt is progressing towards goals. Pt was educated on the importance of mobility to help move fluid from the legs and to help with tightness at posterior knees; pt stated understanding. Pt was Mod I for bed mobility and sit to stand today. Due to balance deficits and decreased safety awareness concerning balance pt is supervision with use of IV pole for 250 ft of gait. Due to pt current functional status, home set up and available assistance at home recommending skilled physical therapy services at 3x/weekly on discharge from acute care hospital setting in order to work on balance/strength in order to decrease risk for falls, injury and return to age related activities.     If plan is discharge home, recommend the following: A little help with walking and/or transfers;Assistance with cooking/housework;Assist for transportation;Help with stairs or ramp for entrance     Equipment Recommendations  Rolling walker (2 wheels)       Precautions / Restrictions Precautions Precautions: Fall Restrictions Weight Bearing Restrictions: No     Mobility  Bed Mobility Overal bed mobility: Modified Independent Bed Mobility: Supine to Sit     Supine to sit: Modified independent (Device/Increase time), HOB elevated     General bed mobility comments: no use of rails. Pt left in recliner at end of session    Transfers Overall transfer level: Modified independent Equipment used: None Transfers: Sit to/from Stand Sit to Stand: Modified independent  (Device/Increase time)           General transfer comment: Pt stood well. held on to IV pole once in standing for minor balance deficits.    Ambulation/Gait Ambulation/Gait assistance: Supervision Gait Distance (Feet): 250 Feet Assistive device: IV Pole Gait Pattern/deviations: Decreased stride length, Step-through pattern Gait velocity: decreased cadence. Gait velocity interpretation: 1.31 - 2.62 ft/sec, indicative of limited community ambulator   General Gait Details: no LOB noted       Balance Overall balance assessment: Needs assistance Sitting-balance support: Feet supported Sitting balance-Leahy Scale: Good     Standing balance support: No upper extremity supported, Bilateral upper extremity supported, During functional activity Standing balance-Leahy Scale: Fair Standing balance comment: no overt LOB. Pt has multi directional sway        Cognition Arousal: Alert Behavior During Therapy: WFL for tasks assessed/performed Overall Cognitive Status: Within Functional Limits for tasks assessed           General Comments General comments (skin integrity, edema, etc.): Skin warm, dry and intact. No noted bruising or lacerations outside of gown. Pt BSC was full but pt stated nursing was measure I and O. Nursing was notified BSC was full and pt was using BSC independently. BSC set back up next to recliner.      Pertinent Vitals/Pain Pain Assessment Pain Assessment: Faces Faces Pain Scale: Hurts a little bit Pain Location: posterior knees Pain Descriptors / Indicators: Tightness Pain Intervention(s): Monitored during session     PT Goals (current goals can now be found in the care plan section) Acute Rehab PT Goals Patient Stated Goal: to  go home PT Goal Formulation: With patient Time For Goal Achievement: 01/01/23 Potential to Achieve Goals: Good Progress towards PT goals: Progressing toward goals    Frequency    Min 1X/week      PT Plan  Continue  with current POC       AM-PAC PT "6 Clicks" Mobility   Outcome Measure  Help needed turning from your back to your side while in a flat bed without using bedrails?: None Help needed moving from lying on your back to sitting on the side of a flat bed without using bedrails?: None Help needed moving to and from a bed to a chair (including a wheelchair)?: None Help needed standing up from a chair using your arms (e.g., wheelchair or bedside chair)?: None Help needed to walk in hospital room?: A Little Help needed climbing 3-5 steps with a railing? : A Little 6 Click Score: 22    End of Session Equipment Utilized During Treatment: Gait belt Activity Tolerance: Patient tolerated treatment well Patient left: in chair;with call bell/phone within reach Nurse Communication: Mobility status PT Visit Diagnosis: Other abnormalities of gait and mobility (R26.89)     Time: 1016-1030 PT Time Calculation (min) (ACUTE ONLY): 14 min  Charges:    $Therapeutic Activity: 8-22 mins PT General Charges $$ ACUTE PT VISIT: 1 Visit                     Anne [name], DPT, CLT  Acute Rehabilitation Services Office: [phone] (Secure chat preferred)    Claudia Desanctis 12/21/2022, 10:36 AM

## 2022-12-21 NOTE — Care Management Important Message (Signed)
Important Message  Patient Details  Name: EEVA BRANDT MRN: [id number] Date of Birth: [birth year]   Medicare Important Message Given:  Yes     Jenney Vandenberge 12/21/2022, 1:23 PM

## 2022-12-22 ENCOUNTER — Inpatient Hospital Stay (HOSPITAL_COMMUNITY): Payer: Medicare Other

## 2022-12-22 ENCOUNTER — Telehealth: Payer: Self-pay | Admitting: *Deleted

## 2022-12-22 DIAGNOSIS — N2589 Other disorders resulting from impaired renal tubular function: Secondary | ICD-10-CM | POA: Diagnosis not present

## 2022-12-22 DIAGNOSIS — E876 Hypokalemia: Secondary | ICD-10-CM | POA: Diagnosis not present

## 2022-12-22 DIAGNOSIS — M6282 Rhabdomyolysis: Secondary | ICD-10-CM | POA: Diagnosis not present

## 2022-12-22 DIAGNOSIS — I4581 Long QT syndrome: Secondary | ICD-10-CM | POA: Diagnosis not present

## 2022-12-22 LAB — CALCIUM / CREATININE RATIO, URINE
Calcium, Ur: 8.6 mg/dL
Calcium/Creat.Ratio: 804 mg/g{creat} — ABNORMAL HIGH (ref 29–442)
Creatinine, Urine: 10.7 mg/dL

## 2022-12-22 LAB — CK: Total CK: 5096 U/L — ABNORMAL HIGH (ref 38–234)

## 2022-12-22 LAB — GLUCOSE, CAPILLARY: Glucose-Capillary: 98 mg/dL (ref 70–99)

## 2022-12-22 MED ORDER — POTASSIUM CHLORIDE 20 MEQ PO PACK
80.0000 meq | PACK | Freq: Once | ORAL | Status: AC
  Administered 2022-12-22: 80 meq via ORAL
  Filled 2022-12-22: qty 4

## 2022-12-22 MED ORDER — IOHEXOL 350 MG/ML SOLN
50.0000 mL | Freq: Once | INTRAVENOUS | Status: AC | PRN
  Administered 2022-12-22: 50 mL via INTRAVENOUS

## 2022-12-22 NOTE — Plan of Care (Signed)

## 2022-12-22 NOTE — Telephone Encounter (Signed)
Call from [facility] Radiology to report abnormal results on Chest Xray.  Want to make sure doctors to follow up .  Message to be sent to inpatient doctors covering patient on floor.

## 2022-12-22 NOTE — Progress Notes (Signed)
                 Subjective Patient endorsing continued leg tightness. Denies numbness/tingling changes different than baseline. No pain. Patient does not feel comfortable with discharge today and would like to work with PT/OT again to increase strength.   Physical exam Blood pressure 101/70, pulse 76, temperature 98.8 F (37.1 C), temperature source Oral, resp. rate 18, weight 83.6 kg, last menstrual period 02/18/2001, SpO2 99%.  Constitutional: well-appearing, lying in bed, in no acute distress Cardiovascular: regular rate and rhythm, no m/r/g Pulmonary/Chest: normal work of breathing on room air, lungs clear to auscultation bilaterally MSK: normal bulk and tone, bilateral LE weakness improved significantly since admission Skin: warm and dry, bilateral non-pitting LEE improved since yesterday Psych: normal mood and behavio  Weight change:    Intake/Output Summary (Last 24 hours) at 12/22/2022 1317 Last data filed at 12/22/2022 1057 Gross per 24 hour  Intake 6179.1 ml  Output 6200 ml  Net -20.9 ml   Net IO Since Admission: -487.45 mL [12/22/22 1317]  Assessment and plan Hospital day 5  Anne [name] is a 67 y.o.female with a PMH of RTA Type I, Crohn's, CKD stage 3a, CAD s/p stent 2019, Lumbar OA s/p lumbar laminectomy with long term opioid treatment, and myositis in 2021 who presents to the emergency department 9/5 with weakness.   Principal Problem:   Non-traumatic rhabdomyolysis Active Problems:   Hypokalemia   Generalized weakness   Crohn's disease of colon with complication (HCC)   Chronic diarrhea   Abnormal LFTs   Rhabdomyolysis   #Non-traumatic Rhabdomyolysis  #Hypokalemia #Prolonged QTc #RTA Type I Exact etiology unclear at this point. Patient had similar presentation in 2021 that was attributed to hypokalemia. Of note, there is an association with Infliximab and hypokalemia through case reports. Hypokalemia on this admission was 2.6. CK continually to  downtrend and is 5,096 (9,834 yesterday). Potassium 3.3 today - 80 mEq Klor-con given. -LR discontinued -Trend CK, CMP -Continue NaHCO3 -Holding Crestor and Zetia   #CAD #STEMI s/p stent placement 2019 -Encourage tobacco cessation outpatient  -Continue Aspirin 81mg  -Holding statin and Zetia   #Chronic Right Hip Pain #Lumbar spine OA -Continue home Oxycodone   #Acute kidney injury superimposed on CKD stage 3a Resolved   #Crohn's Disease of Colon  Follows with GI and receives Infliximab q 2 months.  #Findings on Low-dose Lung CT CT Chest Lung Cancer Screening performed on 8/30 resulted today. Impression: 1. Soft tissue 1.4 x 0.9 cm left anterior mediastinal prevascular nodule, nonspecific, potentially a mildly enlarged lymph node or small thymoma. Cardiothoracic surgical consultation and PET-CT may be considered.  -Given recurrent muscle weakness and potential for thymoma, CT chest w/ contrast and acetylcholine receptor ab's ordered to further characterize mass and assess for MG.   Diet: Regular IVF: None VTE: enoxaparin (LOVENOX) injection 40 mg Start: 12/17/22 2015  Code: Full   Discharge plan: Most likely tomorrow if CK downtrends  Anne Miller, DO 12/22/2022, 1:17 PM  Pager: [phone] After 5pm or weekend: [phone]

## 2022-12-22 NOTE — Telephone Encounter (Signed)
Thank you Venita Sheffield!! Appreciate you reaching out.

## 2022-12-22 NOTE — Telephone Encounter (Signed)
You are welcome

## 2022-12-23 DIAGNOSIS — E876 Hypokalemia: Secondary | ICD-10-CM | POA: Diagnosis not present

## 2022-12-23 DIAGNOSIS — N2589 Other disorders resulting from impaired renal tubular function: Secondary | ICD-10-CM | POA: Diagnosis not present

## 2022-12-23 DIAGNOSIS — I213 ST elevation (STEMI) myocardial infarction of unspecified site: Secondary | ICD-10-CM

## 2022-12-23 DIAGNOSIS — N179 Acute kidney failure, unspecified: Secondary | ICD-10-CM

## 2022-12-23 DIAGNOSIS — N1831 Chronic kidney disease, stage 3a: Secondary | ICD-10-CM

## 2022-12-23 DIAGNOSIS — M6282 Rhabdomyolysis: Secondary | ICD-10-CM | POA: Diagnosis not present

## 2022-12-23 DIAGNOSIS — I2510 Atherosclerotic heart disease of native coronary artery without angina pectoris: Secondary | ICD-10-CM

## 2022-12-23 LAB — COMPREHENSIVE METABOLIC PANEL WITH GFR
ALT: 197 U/L — ABNORMAL HIGH (ref 0–44)
AST: 142 U/L — ABNORMAL HIGH (ref 15–41)
Albumin: 2.5 g/dL — ABNORMAL LOW (ref 3.5–5.0)
Alkaline Phosphatase: 96 U/L (ref 38–126)
Anion gap: 9 (ref 5–15)
BUN: 16 mg/dL (ref 8–23)
CO2: 19 mmol/L — ABNORMAL LOW (ref 22–32)
Calcium: 8.6 mg/dL — ABNORMAL LOW (ref 8.9–10.3)
Chloride: 108 mmol/L (ref 98–111)
Creatinine, Ser: 1.21 mg/dL — ABNORMAL HIGH (ref 0.44–1.00)
GFR, Estimated: 49 mL/min — ABNORMAL LOW
Glucose, Bld: 114 mg/dL — ABNORMAL HIGH (ref 70–99)
Potassium: 4.3 mmol/L (ref 3.5–5.1)
Sodium: 136 mmol/L (ref 135–145)
Total Bilirubin: 0.7 mg/dL (ref 0.3–1.2)
Total Protein: 6.3 g/dL — ABNORMAL LOW (ref 6.5–8.1)

## 2022-12-23 LAB — CK: Total CK: 3151 U/L — ABNORMAL HIGH (ref 38–234)

## 2022-12-23 MED ORDER — ROSUVASTATIN CALCIUM 20 MG PO TABS: 20.0000 mg | ORAL_TABLET | Freq: Every day | ORAL | 0 refills | Status: DC

## 2022-12-23 NOTE — Discharge Summary (Signed)
Name: Anne [name] MRN: [id number] DOB: Mar 06, 1956 67 y.o. PCP: Philomena Doheny, MD  Date of Admission: 12/17/2022  3:13 PM Date of Discharge: 12/23/2022  Attending Physician: Dr. Antony Contras  DISCHARGE DIAGNOSIS:  Primary Problem: Non-traumatic rhabdomyolysis   Hospital Problems: Principal Problem:   Non-traumatic rhabdomyolysis Active Problems:   Hypokalemia   Generalized weakness   Crohn's disease of colon with complication (HCC)   Chronic diarrhea   Abnormal LFTs   Rhabdomyolysis    DISCHARGE MEDICATIONS:   Allergies as of 12/23/2022   No Known Allergies      Medication List     TAKE these medications    aspirin EC 81 MG tablet Take 1 tablet (81 mg total) by mouth daily. Swallow whole.   cholestyramine 4 g packet Commonly known as: QUESTRAN Take 1 packet (4 g total) by mouth 2 (two) times daily.   ezetimibe 10 MG tablet Commonly known as: ZETIA TAKE 1 TABLET(10 MG) BY MOUTH AT BEDTIME   gabapentin 400 MG capsule Commonly known as: NEURONTIN Take 1 capsule (400 mg total) by mouth 3 (three) times daily.   INFLIXIMAB IV Inject 1 Dose into the vein every 8 (eight) weeks.   multivitamin with minerals Tabs tablet Take 1 tablet by mouth in the morning.   nitroGLYCERIN 0.4 MG SL tablet Commonly known as: NITROSTAT Place 1 tablet (0.4 mg total) under the tongue every 5 (five) minutes x 3 doses as needed for chest pain.   oxyCODONE 5 MG immediate release tablet Commonly known as: Roxicodone Take 1-2 tablets (5-10 mg total) by mouth every 6 (six) hours as needed for severe pain (Postoperative pain).   rosuvastatin 20 MG tablet Commonly known as: CRESTOR Take 1 tablet (20 mg total) by mouth daily. What changed:  medication strength how much to take   sodium bicarbonate 650 MG tablet TAKE 3 TABLETS(1950 MG) BY MOUTH TWICE DAILY        DISPOSITION AND FOLLOW-UP:  [name] was discharged from [facility] in Good  condition. At the hospital follow up visit please address:  Follow-up Recommendations:  #Non-traumatic Rhabdomyolysis  #Hypokalemia #RTA Type I -Repeat CMP and CK -Address potassium supplementation as patient instructed to bring in home potassium to determine if this amount is adequate -Assess for residual myalgia/weakness -Of note, there are several case reports of patient's Infliximab (that she receives for Crohn's) being associated with Hypokalemia.   #CAD #STEMI s/p stent placement 2019 -Encourage tobacco cessation and/or referral to [facility] -Ensure compliance with medications (of note Crestor was reduced from 40 to 20 because of two admissions for Rhabdomyolysis. Although this was not assumed to be the cause.   #Acute kidney injury superimposed on CKD stage 3a -CMP as above   #Findings on Low-dose Lung CT CT Chest Lung Cancer Screening performed on 8/30. Impression: 1. Soft tissue 1.4 x 0.9 cm left anterior mediastinal prevascular nodule, nonspecific, potentially a mildly enlarged lymph node or small thymoma. Cardiothoracic surgical consultation and PET-CT may be considered.  -Given recurrent muscle weakness and potential for thymoma, CT chest w/ contrast and acetylcholine receptor ab's ordered to further characterize mass and assess for MG. -CT w/ contrast showed: Stable 13 x 9 mm soft tissue lesion in the anterior mediastinum, unchanged from recent CT. Differential includes an anterior mediastinal node versus a small thymoma. Follow-up CT chest or PET-CT is suggested in 3-6 months acetylcholine Receptor Ab result.  Follow-up Appointments:  Follow-up Information     [facility]  Orthopedic Rehabilitation at [facility] Follow up.   Specialty: Rehabilitation Contact information: [address] [place] [address] [phone]                HOSPITAL COURSE:  Patient Summary:  Anne [name] is a 67 year old female with a past  medical history of RTA type 1, Crohn's disease of colon, CKD stage 3a, CAD, Lumbar OA, S/P lumbar laminectomy with long term opioid treatment, and myositis in 2021 who presents to the emergency department with weakness on 9/5. Subsequent work-up revealed a CK of 19,000, significant elevated LFT's, and an AKI. Potassium was low at 2.6. Patient started on LR @ 300 ml/hr for for 3 days and aforementioned values significantly improved (patient also given potassium). On Day 4, LR was reduced to 150 ml/hr and then stopped as patient's values continued to downtrend. CK was 3,000 on discharge. Patient's weakness had also significantly improved.  During hospital course, primary team was notified about incidental findings on patient's Lung Cancer Screening CT.  Impression: 1. Soft tissue 1.4 x 0.9 cm left anterior mediastinal prevascular nodule, nonspecific, potentially a mildly enlarged lymph node or small thymoma. Cardiothoracic surgical consultation and PET-CT may be considered.  -Given recurrent muscle weakness and potential for thymoma, CT chest w/ contrast and acetylcholine receptor ab's ordered to further characterize mass and assess for MG. -CT w/ contrast showed: Stable 13 x 9 mm soft tissue lesion in the anterior mediastinum, unchanged from recent CT. Differential includes an anterior mediastinal node versus a small thymoma. Follow-up CT chest or PET-CT is suggested in 3-6 months acetylcholine Receptor Ab result.  Patient notified of findings and was instructed to follow-up with PCP for further referral/plan.   DISCHARGE INSTRUCTIONS:   Discharge Instructions     Diet - low sodium heart healthy   Complete by: As directed    Discharge instructions   Complete by: As directed    It was a pleasure being a part of your care team:  You were treated for a condition called Rhabdomyolysis that causes muscle weakness/pain. The cause is still unclear, but there is an association with low potassium  which had been consistent each time you've been hospitalized with this. Please take all medications as prescribed in addition to the potassium/magnesium supplement you bought over-the-counter. Please bring in the doses for these at you follow-up appoint at the [facility] on 9/16 @ 9:45 with Dr. Versie Starks.  As we discussed, there was also an incidental finding on your lung cancer screening scan that needs to followed up on. I will relay the results to Dr. Versie Starks and you all can plan the appropriate follow-up timetable. Nothing to be alarmed about at this point.  Please use the rolling walker you will be sent home with until your weakness has resolved.   Increase activity slowly   Complete by: As directed        SUBJECTIVE:  Patient is feeling well and is ready for discharge. Discharge Vitals:   BP 103/67 (BP Location: Left Arm)   Pulse 89   Temp 98.8 F (37.1 C)   Resp 17   Wt 83.8 kg   LMP 02/18/2001   SpO2 97%   BMI 27.28 kg/m   OBJECTIVE:  Physical Exam:  Constitutional: well-appearing, lying in bed, in no acute distress Cardiovascular: regular rate and rhythm, no m/r/g Pulmonary/Chest: normal work of breathing on room air, lungs clear to auscultation bilaterally MSK: normal bulk and tone, bilateral LE weakness improved significantly since  admission Skin: warm and dry, bilateral non-pitting LEE (from IVF) improved  Psych: normal mood and behavio  Pertinent Labs, Studies, and Procedures:     Latest Ref Rng & Units 12/19/2022    6:21 AM 12/18/2022    5:40 AM 12/17/2022    3:47 PM  CBC  WBC 4.0 - 10.5 K/uL 8.9  8.3  8.7   Hemoglobin 12.0 - 15.0 g/dL 29.5  28.4  13.2   Hematocrit 36.0 - 46.0 % 38.5  39.6  45.9   Platelets 150 - 400 K/uL 274  271  347        Latest Ref Rng & Units 12/23/2022    2:06 AM 12/21/2022    3:35 PM 12/20/2022    2:30 AM  CMP  Glucose 70 - 99 mg/dL 440  102  91   BUN 8 - 23 mg/dL 16  14  14    Creatinine 0.44 - 1.00 mg/dL 7.25  3.66  4.40   Sodium 135 - 145  mmol/L 136  134  141   Potassium 3.5 - 5.1 mmol/L 4.3  3.3  4.0   Chloride 98 - 111 mmol/L 108  111  113   CO2 22 - 32 mmol/L 19  18  19    Calcium 8.9 - 10.3 mg/dL 8.6  7.9  8.3   Total Protein 6.5 - 8.1 g/dL 6.3  5.4  5.3   Total Bilirubin 0.3 - 1.2 mg/dL 0.7  0.5  0.5   Alkaline Phos 38 - 126 U/L 96  80  79   AST 15 - 41 U/L 142  209  281   ALT 0 - 44 U/L 197  227  266     DG HIP UNILAT WITH PELVIS 2-3 VIEWS RIGHT  Result Date: 12/18/2022 CLINICAL DATA:  Right hip pain after fall off. EXAM: DG HIP (WITH OR WITHOUT PELVIS) 2-3V RIGHT COMPARISON:  None Available. FINDINGS: No acute fracture of the pelvis or right hip. Femoral head is seated in the acetabulum, no dislocation. Probable subchondral cyst in the lateral femoral head. Pubic rami are intact. Pubic symphysis and sacroiliac joints are congruent. IMPRESSION: No acute fracture of the pelvis or right hip. Electronically Signed   By: Narda Rutherford M.D.   On: 12/18/2022 13:48   DG Chest Portable 1 View  Result Date: 12/17/2022 CLINICAL DATA:  Weakness. EXAM: PORTABLE CHEST 1 VIEW COMPARISON:  02/01/2020, lung cancer screening CT 12/11/2022 FINDINGS: The cardiomediastinal contours are normal. The lungs are clear. Pulmonary vasculature is normal. No consolidation, pleural effusion, or pneumothorax. No acute osseous abnormalities are seen. IMPRESSION: No acute chest findings. Electronically Signed   By: Narda Rutherford M.D.   On: 12/17/2022 18:49     Signed: Carmina Miller, DO  Internal Medicine Resident, PGY-1 [facility]  Pager: [phone] 6:42 PM, 12/23/2022

## 2022-12-23 NOTE — Progress Notes (Signed)
Occupational Therapy Treatment and Discharge Patient Details Name: Anne [name] MRN: [id number] DOB: 23-Nov-1955 Today's Date: 12/23/2022   History of present illness 67 year old female who presents to the emergency department with weakness. She also reports a recent fall due to BLE weakness. Past medical history of RTA type 1, Crohn's disease of colon, CKD stage 3a, CAD, Lumbar OA, S/P lumbar laminectomy with long term opioid treatment, and myositis in 2021   OT comments  Pt reports feeling back to  her baseline. Modified independent in self care, sat at sink for bathing. Did not address tub transfers as pt is going to her sister's home who has a walk in shower with grab bars and seat. Pt discharging today. No further OT needs.       If plan is discharge home, recommend the following:  Assistance with cooking/housework;Assist for transportation;Help with stairs or ramp for entrance   Equipment Recommendations  None recommended by OT    Recommendations for Other Services      Precautions / Restrictions Precautions Precautions: Fall Restrictions Weight Bearing Restrictions: No       Mobility Bed Mobility Overal bed mobility: Modified Independent                  Transfers Overall transfer level: Modified independent Equipment used: None                     Balance Overall balance assessment: Needs assistance   Sitting balance-Leahy Scale: Good     Standing balance support: No upper extremity supported, Bilateral upper extremity supported, During functional activity Standing balance-Leahy Scale: Good                             ADL either performed or assessed with clinical judgement   ADL Overall ADL's : Modified independent                         Toilet Transfer: Modified Independent           Functional mobility during ADLs: Modified independent      Extremity/Trunk Assessment              Vision        Perception     Praxis      Cognition Arousal: Alert Behavior During Therapy: WFL for tasks assessed/performed Overall Cognitive Status: Within Functional Limits for tasks assessed                                          Exercises      Shoulder Instructions       General Comments VSS on RA, independent with dressing to get ready to go home    Pertinent Vitals/ Pain       Pain Assessment Pain Assessment: No/denies pain  Home Living                                          Prior Functioning/Environment              Frequency           Progress Toward Goals  OT Goals(current goals can now be found in the care plan section)  Progress towards OT goals: Goals met/education completed, patient discharged from OT     Plan      Co-evaluation                 AM-PAC OT "6 Clicks" Daily Activity     Outcome Measure   Help from another person eating meals?: None Help from another person taking care of personal grooming?: None Help from another person toileting, which includes using toliet, bedpan, or urinal?: None Help from another person bathing (including washing, rinsing, drying)?: None Help from another person to put on and taking off regular upper body clothing?: None Help from another person to put on and taking off regular lower body clothing?: None 6 Click Score: 24    End of Session Equipment Utilized During Treatment: Gait belt  OT Visit Diagnosis: Muscle weakness (generalized) (M62.81)   Activity Tolerance Patient tolerated treatment well   Patient Left in bed;with call bell/phone within reach;with bed alarm set   Nurse Communication          Time: 5638-7564 OT Time Calculation (min): 19 min  Charges: OT General Charges $OT Visit: 1 Visit OT Treatments $Self Care/Home Management : 8-22 mins  Anne [name], OTR/L Acute Rehabilitation Services Office: [phone]   Anne [name] 12/23/2022, 11:35 AM

## 2022-12-23 NOTE — Progress Notes (Signed)
Physical Therapy Treatment Patient Details Name: Anne [name] MRN: [id number] DOB: [birth year] Today's Date: 12/23/2022   History of Present Illness 67 year old female who presents to the emergency department with weakness. She also reports a recent fall due to BLE weakness. Past medical history of RTA type 1, Crohn's disease of colon, CKD stage 3a, CAD, Lumbar OA, S/P lumbar laminectomy with long term opioid treatment, and myositis in 2021    PT Comments  RN in room on entry, pt getting dressed to go home. Pt impatient about discharge, but agreeable to walk with PT and make sure her new RW is adjusted to correctly. Pt able to put on shoes independently sitting EoB. Mod I for transfers and supervision with RW needing cuing for staying in RW with turns to keep from tripping over it. D/c plans remain appropriate. Pt eager to discharge home.    If plan is discharge home, recommend the following: A little help with walking and/or transfers;Assistance with cooking/housework;Assist for transportation;Help with stairs or ramp for entrance     Equipment Recommendations  Rolling walker (2 wheels)       Precautions / Restrictions Precautions Precautions: Fall Restrictions Weight Bearing Restrictions: No     Mobility  Bed Mobility               General bed mobility comments: sitting EoB getting dressed able to bend over and put shoes on without difficulty    Transfers Overall transfer level: Modified independent Equipment used: None Transfers: Sit to/from Stand Sit to Stand: Modified independent (Device/Increase time)           General transfer comment: good power up and self steadying, reports increased stability    Ambulation/Gait Ambulation/Gait assistance: Modified independent (Device/Increase time), Supervision Gait Distance (Feet): 100 Feet Assistive device: Rolling walker (2 wheels) Gait Pattern/deviations: Decreased stride length, Step-through pattern Gait velocity:  decreased cadence. Gait velocity interpretation: 1.31 - 2.62 ft/sec, indicative of limited community ambulator   General Gait Details: adjusted pt walker for taking home, strong, steady gait, vc for proximity to RW with turns to keep from tripping over walker       Balance Overall balance assessment: Needs assistance Sitting-balance support: Feet supported Sitting balance-Leahy Scale: Good     Standing balance support: No upper extremity supported, Bilateral upper extremity supported, During functional activity Standing balance-Leahy Scale: Good                              Cognition Arousal: Alert Behavior During Therapy: WFL for tasks assessed/performed Overall Cognitive Status: Within Functional Limits for tasks assessed                                             General Comments General comments (skin integrity, edema, etc.): VSS on RA, independent with dressing to get ready to go home      Pertinent Vitals/Pain Pain Assessment Pain Assessment: No/denies pain     PT Goals (current goals can now be found in the care plan section) Acute Rehab PT Goals Patient Stated Goal: to go home PT Goal Formulation: With patient Time For Goal Achievement: 01/01/23 Potential to Achieve Goals: Good Progress towards PT goals: Progressing toward goals    Frequency    Min 1X/week       AM-PAC PT "6 Clicks" Mobility  Outcome Measure  Help needed turning from your back to your side while in a flat bed without using bedrails?: None Help needed moving from lying on your back to sitting on the side of a flat bed without using bedrails?: None Help needed moving to and from a bed to a chair (including a wheelchair)?: None Help needed standing up from a chair using your arms (e.g., wheelchair or bedside chair)?: None Help needed to walk in hospital room?: A Little Help needed climbing 3-5 steps with a railing? : A Little 6 Click Score: 22    End  of Session Equipment Utilized During Treatment: Gait belt Activity Tolerance: Patient tolerated treatment well Patient left: with call bell/phone within reach;in bed Nurse Communication: Mobility status PT Visit Diagnosis: Other abnormalities of gait and mobility (R26.89)     Time: 2956-2130 PT Time Calculation (min) (ACUTE ONLY): 10 min  Charges:    $Gait Training: 8-22 mins PT General Charges $$ ACUTE PT VISIT: 1 Visit                     Marina Desire B. Beverely Risen PT, DPT Acute Rehabilitation Services Please use secure chat or  Call Office [phone]    [facility]on [facility] 12/23/2022, 10:42 AM

## 2022-12-24 ENCOUNTER — Ambulatory Visit: Payer: Medicare Other

## 2022-12-28 ENCOUNTER — Encounter: Payer: Self-pay | Admitting: Student

## 2022-12-28 ENCOUNTER — Ambulatory Visit (INDEPENDENT_AMBULATORY_CARE_PROVIDER_SITE_OTHER): Payer: Medicare Other | Admitting: Student

## 2022-12-28 VITALS — BP 90/60 | HR 80 | Temp 97.8°F | Ht 69.0 in | Wt 165.8 lb

## 2022-12-28 DIAGNOSIS — M6282 Rhabdomyolysis: Secondary | ICD-10-CM | POA: Diagnosis not present

## 2022-12-28 DIAGNOSIS — N1832 Chronic kidney disease, stage 3b: Secondary | ICD-10-CM

## 2022-12-28 DIAGNOSIS — Z720 Tobacco use: Secondary | ICD-10-CM | POA: Insufficient documentation

## 2022-12-28 DIAGNOSIS — K50119 Crohn's disease of large intestine with unspecified complications: Secondary | ICD-10-CM | POA: Diagnosis not present

## 2022-12-28 DIAGNOSIS — I9589 Other hypotension: Secondary | ICD-10-CM

## 2022-12-28 DIAGNOSIS — R531 Weakness: Secondary | ICD-10-CM | POA: Diagnosis not present

## 2022-12-28 DIAGNOSIS — E876 Hypokalemia: Secondary | ICD-10-CM

## 2022-12-28 DIAGNOSIS — J9859 Other diseases of mediastinum, not elsewhere classified: Secondary | ICD-10-CM | POA: Diagnosis not present

## 2022-12-28 DIAGNOSIS — Z87891 Personal history of nicotine dependence: Secondary | ICD-10-CM

## 2022-12-28 DIAGNOSIS — E782 Mixed hyperlipidemia: Secondary | ICD-10-CM | POA: Diagnosis not present

## 2022-12-28 MED ORDER — NICOTINE POLACRILEX 4 MG MT GUM: 4.0000 mg | CHEWING_GUM | OROMUCOSAL | 0 refills | Status: DC | PRN

## 2022-12-28 NOTE — Assessment & Plan Note (Addendum)
During her recent hospitalization, an incidental left anterior mediastinal mass was noted.  It measures 13 x 9 mm.  Differential includes enlarged lymph node versus small thymoma.  Radiology recommended CT or PET-CT follow-up in 3-6 months. Patient denies fevers, chills, night sweats.  On exam, no firm lymph nodes were palpated.  Discussed this finding at length with the patient and shared my recommendation of conservative management for now with a repeat CT scan in 3 months.  Counseled her that should she experience any new fevers, chills, night sweats, or weight loss, she should give [place] a call for further evaluation.  During her hospitalization, acetylcholine receptor antibody tests were ordered, but it looks like they were discontinued upon her discharge.  She does have generalized weakness and fatigue, but no specific findings for myasthenia gravis were evident on my exam today.  Nevertheless, since this lab had not been done and she has a possible thymic mass on CT, we will reorder this lab as it may help in our management.  Plan - Repeat CT chest with contrast in 3 months - Acetylcholine receptor antibodies

## 2022-12-28 NOTE — Patient Instructions (Signed)
Thank you, Ms.Aniceto Boss for allowing [place] to provide your care today. Today we discussed your recent lung scan, your potassium, and your muscle weakness and pain.    We have put in orders for you to draw labs at a [facility] facility.  Please today if you are able to.  We will call you once we receive the results.  We discussed your recent lung scan which showed a mass in the anterior mediastinum.  We will continue to follow this conservatively with a repeat CT chest scan in the next 3 months.  If at any point you start to experience fevers, chills, night sweats, or weight loss, please give [place] a call.  As we discussed, please stop taking your rosuvastatin 40 mg daily dose and START taking rosuvastatin 20 mg daily.  A common side effect of this medication is muscle pain, so we have adjusted this medication with the hope that your symptoms will improve.  Please continue to take the potassium you have daily until we get the results of your lab and can make adjustments.  I have ordered the following labs for you:  Lab Orders         CK, total         COMPLETE METABOLIC PANEL WITH GFR         Acetylcholine Receptor, Binding      Tests ordered today:  CT Chest with contrast (for December)  Referrals ordered today:   Referral Orders         Ambulatory referral to Physical Therapy      I have ordered the following medication/changed the following medications:   Start the following medications: Meds ordered this encounter  Medications   nicotine polacrilex (NICORETTE) 4 MG gum    Sig: Take 1 each (4 mg total) by mouth as needed for smoking cessation.    Dispense:  100 tablet    Refill:  0     Follow up: 2-3 months   We look forward to seeing you next time. Please call our clinic at [phone] if you have any questions or concerns. The best time to call is Monday-Friday from 9am-4pm, but there is someone available 24/7. If after hours or the weekend, call the main hospital number and ask  for the Internal Medicine Resident On-Call. If you need medication refills, please notify your pharmacy one week in advance and they will send [place] a request.   Thank you for trusting me with your care. Wishing you the best!   Annett Fabian, MD [facility]

## 2022-12-28 NOTE — Assessment & Plan Note (Addendum)
Patient was recently admitted for hypokalemia (2.7) in the setting of RTA type I, nontraumatic rhabdomyolysis, and chronic kidney disease stage IIIa.  She was not discharged with any potassium but has been taking 550 mg of potassium daily at home.  This converts to around 2.3 mEq, which is a low dose.  We are rechecking her potassium today and we will make adjustments as appropriate.   Of note, the patient prefers [facility] lab tests instead of [facility] as she says they are more affordable with her insurance, so I updated the orders to be assigned to [facility] and she will have to go to their facility to draw the labs today.  Plan - Continue taking home potassium for now - We will check a repeat CMP today; may call her about changing her potassium dose once we have the results

## 2022-12-28 NOTE — Assessment & Plan Note (Signed)
>>  ASSESSMENT AND PLAN FOR NON-TRAUMATIC RHABDOMYOLYSIS WRITTEN ON 12/28/2022 10:59 AM BY JUSTUS, MICHAEL, MD  Patient was admitted with rhabdomyolysis and her CK was over 19,000.  She also had an AKI, which resolved during her hospitalization.  She continues to report myalgias and muscle weakness, but says they have been improving since her hospitalization.  Plan - Repeat CMP, CK today

## 2022-12-28 NOTE — Assessment & Plan Note (Addendum)
Blood pressure today was 90/60.  Patient says her blood pressure is always this low and has not had any recent episodes of symptomatic hypotension, including syncope or lightheadedness.  She is not currently on any blood pressure lowering medications. We will recheck it at her next visit.

## 2022-12-28 NOTE — Progress Notes (Signed)
CC: Hospital follow up  HPI:  Anne [name] is a 67 y.o. female living with a history stated below and presents today for Hospital follow up. Please see problem based assessment and plan for additional details.  Past Medical History:  Diagnosis Date   Coronary artery disease    Crohn's colitis (HCC)    Crohn's disease (HCC)    GERD (gastroesophageal reflux disease)    High cholesterol    Hyperlipidemia    Hypertension    Hypokalemia 08/04/2019   STEMI (ST elevation myocardial infarction) (HCC)    10/27/17 PCI/DES x1 to the dRCA, with residual thrombus in the PLA, normal EF    Current Outpatient Medications on File Prior to Visit  Medication Sig Dispense Refill   aspirin EC 81 MG tablet Take 1 tablet (81 mg total) by mouth daily. Swallow whole. 90 tablet 3   cholestyramine (QUESTRAN) 4 g packet Take 1 packet (4 g total) by mouth 2 (two) times daily. (Patient not taking: Reported on 12/18/2022) 180 packet 0   ezetimibe (ZETIA) 10 MG tablet TAKE 1 TABLET(10 MG) BY MOUTH AT BEDTIME 90 tablet 0   gabapentin (NEURONTIN) 400 MG capsule Take 1 capsule (400 mg total) by mouth 3 (three) times daily. 270 capsule 0   INFLIXIMAB IV Inject 1 Dose into the vein every 8 (eight) weeks.     Multiple Vitamin (MULTIVITAMIN WITH MINERALS) TABS tablet Take 1 tablet by mouth in the morning.     nitroGLYCERIN (NITROSTAT) 0.4 MG SL tablet Place 1 tablet (0.4 mg total) under the tongue every 5 (five) minutes x 3 doses as needed for chest pain. 25 tablet 2   oxyCODONE (ROXICODONE) 5 MG immediate release tablet Take 1-2 tablets (5-10 mg total) by mouth every 6 (six) hours as needed for severe pain (Postoperative pain). 30 tablet 0   rosuvastatin (CRESTOR) 20 MG tablet Take 1 tablet (20 mg total) by mouth daily. 90 tablet 0   sodium bicarbonate 650 MG tablet TAKE 3 TABLETS(1950 MG) BY MOUTH TWICE DAILY 180 tablet 3   No current facility-administered medications on file prior to visit.    Family History   Problem Relation Age of Onset   Stroke Mother    Crohn's disease Neg Hx     Social History   Socioeconomic History   Marital status: Single    Spouse name: Not on file   Number of children: 1   Years of education: Not on file   Highest education level: Not on file  Occupational History   Not on file  Tobacco Use   Smoking status: Former    Current packs/day: 0.00    Average packs/day: 0.3 packs/day for 45.0 years (11.3 ttl pk-yrs)    Types: Cigarettes    Start date: 11/18/1976    Quit date: 11/18/2021    Years since quitting: 1.1   Smokeless tobacco: Never  Vaping Use   Vaping status: Never Used  Substance and Sexual Activity   Alcohol use: No   Drug use: No   Sexual activity: Not on file  Other Topics Concern   Not on file  Social History Narrative   Not on file   Social Determinants of Health   Financial Resource Strain: Not on file  Food Insecurity: No Food Insecurity (12/17/2022)   Hunger Vital Sign    Worried About Running Out of Food in the Last Year: Never true    Ran Out of Food in the Last Year: Never true  Transportation Needs: No Transportation Needs (12/17/2022)   PRAPARE - Administrator, Civil Service (Medical): No    Lack of Transportation (Non-Medical): No  Physical Activity: Not on file  Stress: Not on file  Social Connections: Unknown (04/07/2022)   Received from [facility]   Social Network    Social Network: Not on file  Intimate Partner Violence: Not At Risk (12/17/2022)   Humiliation, Afraid, Rape, and Kick questionnaire    Fear of Current or Ex-Partner: No    Emotionally Abused: No    Physically Abused: No    Sexually Abused: No    Review of Systems: ROS negative except for what is noted on the assessment and plan.  Vitals:   12/28/22 0947 12/28/22 0955  BP: 95/61 90/60  Pulse: 79 80  Temp: 97.8 F (36.6 C)   TempSrc: Oral   SpO2: 98%   Weight: 165 lb 12.8 oz (75.2 kg)   Height: 5\' 9"  (1.753 m)      Physical Exam: Constitutional: well-appearing, sitting in NAD HENT: unremarkable, no enlarged lymph nodes Eyes: unremarkable Cardiovascular: regular rate and rhythm, no m/r/g, no LE edema Pulmonary/Chest: CTA bilaterally, normal WOB  Abdominal: soft, non-tender, non-distended MSK: normal bulk and tone; non-tender to light palpation Neurological: alert & oriented x 3, no focal deficits; equal strength in bilateral upper and lower extremities Skin: warm and dry Psych: normal mood and behavior  Assessment & Plan:   Patient seen with Dr. Sol Blazing  Chronic low blood pressure Blood pressure today was 90/60.  Patient says her blood pressure is always this low and has not had any recent episodes of symptomatic hypotension, including syncope or lightheadedness.  She is not currently on any blood pressure lowering medications. We will recheck it at her next visit.   Hypokalemia Patient was recently admitted for hypokalemia (2.7) in the setting of RTA type I, nontraumatic rhabdomyolysis, and chronic kidney disease stage IIIa.  She was not discharged with any potassium but has been taking 550 mg of potassium daily at home.  This converts to around 2.3 mEq, which is a low dose.  We are rechecking her potassium today and we will make adjustments as appropriate.   Of note, the patient prefers [facility] lab tests instead of [facility] as she says they are more affordable with her insurance, so I updated the orders to be assigned to [facility] and she will have to go to their facility to draw the labs today.  Plan - Continue taking home potassium for now - We will check a repeat CMP today; may call her about changing her potassium dose once we have the results  Stage 3b chronic kidney disease (HCC) Patient had recent labs on 9/11.  Her creatinine at that time was 1.21, which is around her baseline.  GFR was 49, consistent with chronic kidney disease stage IIIa.  During her hospitalization, she was found to have an  AKI, which resolved.  Plan -We will recheck a CMP today.  Crohn's disease of colon with complication [facility]) Patient has Crohn's disease and receives infliximab infusions every 2 months.  She follows with Dr. [name].  During her hospitalization, there was some concern that the Biologics may be contributing to her hypokalemia. Patient denies any recent exacerbations. She should follow up with her Gastroenterologist.   Mediastinal mass During her recent hospitalization, an incidental left anterior mediastinal mass was noted.  It measures 13 x 9 mm.  Differential includes enlarged lymph node versus  small thymoma.  Radiology recommended CT or PET-CT follow-up in 3-6 months. Patient denies fevers, chills, night sweats.  On exam, no firm lymph nodes were palpated.  Discussed this finding at length with the patient and shared my recommendation of conservative management for now with a repeat CT scan in 3 months.  Counseled her that should she experience any new fevers, chills, night sweats, or weight loss, she should give [place] a call for further evaluation.  During her hospitalization, acetylcholine receptor antibody tests were ordered, but it looks like they were discontinued upon her discharge.  She does have generalized weakness and fatigue, but no specific findings for myasthenia gravis were evident on my exam today.  Nevertheless, since this lab had not been done and she has a possible thymic mass on CT, we will reorder this lab as it may help in our management.  Plan - Repeat CT chest with contrast in 3 months - Acetylcholine receptor antibodies  Mixed hyperlipidemia Patient had been taking rosuvastatin 40 mg prior to her hospitalization.  Given her rhabdomyolysis and myalgias, her dose was decreased to 20 mg daily with the hope that it would improve her symptoms.  She reports that she has continue taking the 40 mg daily since her hospital discharge.  I encouraged her to stop taking the 40 mg  daily and pick up the 20 mg at her pharmacy as this may help improve her symptoms.  Non-traumatic rhabdomyolysis Patient was admitted with rhabdomyolysis and her CK was over 19,000.  She also had an AKI, which resolved during her hospitalization.  She continues to report myalgias and muscle weakness, but says they have been improving since her hospitalization.  Plan - Repeat CMP, CK today  Tobacco abuse Patient reports smoking 2 to 3 cigarettes/day since being discharged from the hospital.  She is motivated to quit, but would like to avoid medications.  She is open to nicotine replacement gum, so I will prescribe that today.  Recommend following up on this at her next visit.   Annett Fabian, MD  [facility] Internal Medicine, PGY-1 Phone: [phone] Date 12/28/2022 Time 11:03 AM

## 2022-12-28 NOTE — Assessment & Plan Note (Signed)
Patient had recent labs on 9/11.  Her creatinine at that time was 1.21, which is around her baseline.  GFR was 49, consistent with chronic kidney disease stage IIIa.  During her hospitalization, she was found to have an AKI, which resolved.  Plan -We will recheck a CMP today.

## 2022-12-28 NOTE — Assessment & Plan Note (Signed)
>>  ASSESSMENT AND PLAN FOR HYPOKALEMIA WRITTEN ON 12/28/2022 10:57 AM BY JUSTUS, MICHAEL, MD  Patient was recently admitted for hypokalemia (2.7) in the setting of RTA type I, nontraumatic rhabdomyolysis, and chronic kidney disease stage IIIa.  She was not discharged with any potassium but has been taking 550 mg of potassium daily at home.  This converts to around 2.3 mEq, which is a low dose.  We are rechecking her potassium today and we will make adjustments as appropriate.   Of note, the patient prefers [facility] lab tests instead of [facility] as she says they are more affordable with her insurance, so I updated the orders to be assigned to [facility] and she will have to go to their facility to draw the labs today.  Plan - Continue taking home potassium for now - We will check a repeat CMP today; may call her about changing her potassium dose once we have the results

## 2022-12-28 NOTE — Assessment & Plan Note (Signed)
Patient reports smoking 2 to 3 cigarettes/day since being discharged from the hospital.  She is motivated to quit, but would like to avoid medications.  She is open to nicotine replacement gum, so I will prescribe that today.  Recommend following up on this at her next visit.

## 2022-12-28 NOTE — Assessment & Plan Note (Signed)
Patient was admitted with rhabdomyolysis and her CK was over 19,000.  She also had an AKI, which resolved during her hospitalization.  She continues to report myalgias and muscle weakness, but says they have been improving since her hospitalization.  Plan - Repeat CMP, CK today

## 2022-12-28 NOTE — Assessment & Plan Note (Addendum)
Patient had been taking rosuvastatin 40 mg prior to her hospitalization.  Given her rhabdomyolysis and myalgias, her dose was decreased to 20 mg daily with the hope that it would improve her symptoms.  She reports that she has continue taking the 40 mg daily since her hospital discharge.  I encouraged her to stop taking the 40 mg daily and pick up the 20 mg at her pharmacy as this may help improve her symptoms.

## 2022-12-28 NOTE — Assessment & Plan Note (Addendum)
Patient has Crohn's disease and receives infliximab infusions every 2 months.  She follows with Dr. [name].  During her hospitalization, there was some concern that the Biologics may be contributing to her hypokalemia. Patient denies any recent exacerbations. She should follow up with her Gastroenterologist.

## 2022-12-29 DIAGNOSIS — E876 Hypokalemia: Secondary | ICD-10-CM | POA: Diagnosis not present

## 2022-12-29 DIAGNOSIS — N1832 Chronic kidney disease, stage 3b: Secondary | ICD-10-CM | POA: Diagnosis not present

## 2022-12-29 DIAGNOSIS — M5416 Radiculopathy, lumbar region: Secondary | ICD-10-CM | POA: Diagnosis not present

## 2022-12-29 DIAGNOSIS — J9859 Other diseases of mediastinum, not elsewhere classified: Secondary | ICD-10-CM | POA: Diagnosis not present

## 2022-12-29 NOTE — Addendum Note (Signed)
Addended by: Dickie La on: 12/29/2022 08:30 AM   Modules accepted: Level of Service

## 2022-12-29 NOTE — Progress Notes (Signed)
Internal Medicine Clinic Attending  I was physically present during the key portions of the resident provided service and participated in the medical decision making of patient's management care. I reviewed pertinent patient test results.  The assessment, diagnosis, and plan were formulated together and I agree with the documentation in the resident's note.  Dickie La, MD

## 2022-12-30 ENCOUNTER — Other Ambulatory Visit: Payer: Self-pay | Admitting: *Deleted

## 2022-12-30 DIAGNOSIS — J9859 Other diseases of mediastinum, not elsewhere classified: Secondary | ICD-10-CM

## 2022-12-30 DIAGNOSIS — E876 Hypokalemia: Secondary | ICD-10-CM

## 2022-12-30 DIAGNOSIS — N1832 Chronic kidney disease, stage 3b: Secondary | ICD-10-CM

## 2023-01-04 NOTE — Therapy (Unsigned)
OUTPATIENT PHYSICAL THERAPY THORACOLUMBAR EVALUATION   Patient Name: Anne [name] MRN: [id number] DOB:03-Apr-1956, 67 y.o., female Today's Date: 01/07/2023  END OF SESSION:  PT End of Session - 01/07/23 1700     Visit Number 1    Number of Visits 23    Date for PT Re-Evaluation 04/01/23    Authorization Type Medicare A & B BCBS secondary no authrequired    PT Start Time 1400    PT Stop Time 1445    PT Time Calculation (min) 45 min             Past Medical History:  Diagnosis Date   Coronary artery disease    Crohn's colitis (HCC)    Crohn's disease (HCC)    GERD (gastroesophageal reflux disease)    High cholesterol    Hyperlipidemia    Hypertension    Hypokalemia 08/04/2019   STEMI (ST elevation myocardial infarction) (HCC)    10/27/17 PCI/DES x1 to the dRCA, with residual thrombus in the PLA, normal EF   Past Surgical History:  Procedure Laterality Date   ANTERIOR CERVICAL DECOMP/DISCECTOMY FUSION N/A 01/02/2022   Procedure: Anterior Cervical Decompression Fusion - Cervical three-Cervical four - Cervical four-Cervical five - Cervical five-Cervical six;  Surgeon: Tia Alert, MD;  Location: [facility] OR;  Service: Neurosurgery;  Laterality: N/A;   BACK SURGERY  2014   done in [place]   BIOPSY  09/22/2019   Procedure: BIOPSY;  Surgeon: [name], MD;  Location: [facility]COPY;  Service: Endoscopy;;   BIOPSY  05/29/2022   Procedure: BIOPSY;  Surgeon: [name], MD;  Location: [facility];  Service: Gastroenterology;;   CHOLECYSTECTOMY  2005   COLONOSCOPY WITH PROPOFOL N/A 09/22/2019   Procedure: COLONOSCOPY WITH PROPOFOL;  Surgeon: [name], MD;  Location: [facility];  Service: Endoscopy;  Laterality: N/A;   COLONOSCOPY WITH PROPOFOL N/A 05/29/2022   Procedure: COLONOSCOPY WITH PROPOFOL;  Surgeon: [name], MD;  Location: [facility];  Service: Gastroenterology;  Laterality: N/A;   CORONARY/GRAFT ACUTE MI REVASCULARIZATION N/A 10/26/2017    Procedure: Coronary/Graft Acute MI Revascularization;  Surgeon: Lennette Bihari, MD;  Location: [facility] INVASIVE CV LAB;  Service: Cardiovascular;  Laterality: N/A;   FLEXIBLE SIGMOIDOSCOPY N/A 12/12/2021   Procedure: FLEXIBLE SIGMOIDOSCOPY;  Surgeon: [name], MD;  Location: [facility];  Service: Gastroenterology;  Laterality: N/A;   LEFT HEART CATH AND CORONARY ANGIOGRAPHY N/A 10/26/2017   Procedure: LEFT HEART CATH AND CORONARY ANGIOGRAPHY;  Surgeon: Lennette Bihari, MD;  Location: [facility] INVASIVE CV LAB;  Service: Cardiovascular;  Laterality: N/A;   LEFT HEART CATH AND CORONARY ANGIOGRAPHY Left 05/05/2018   Procedure: Left heart cath and coronary angiography;  Surgeon: Laurier Nancy, MD;  Location: [facility] INVASIVE CV LAB;  Service: Cardiovascular;  Laterality: Left;   LUMBAR LAMINECTOMY/DECOMPRESSION MICRODISCECTOMY Left 06/05/2022   Procedure: LAMINECTOMY AND FORAMINOTOMY - LUMBAR FOUR-LUMBAR FIVE - LUMBAR FIVE-SACRAL ONE - LEFT;  Surgeon: Tia Alert, MD;  Location: [facility] OR;  Service: Neurosurgery;  Laterality: Left;  3C   MUSCLE BIOPSY Left 08/10/2019   Procedure: THIGH MUSCLE BIOPSY;  Surgeon: Abigail Miyamoto, MD;  Location: [facility] OR;  Service: General;  Laterality: Left;   SMALL INTESTINE SURGERY     TONSILLECTOMY     removed as a child   Patient Active Problem List   Diagnosis Date Noted   Mediastinal mass 12/28/2022   Tobacco abuse 12/28/2022   Non-traumatic rhabdomyolysis 12/18/2022   Rhabdomyolysis 12/18/2022   Chronic low blood pressure 11/26/2022  Back pain 11/26/2022   Encounter for smoking cessation counseling 11/26/2022   Health care maintenance 11/26/2022   Stage 3b chronic kidney disease (HCC) 10/09/2022   S/P lumbar laminectomy 06/05/2022   Spinal stenosis of lumbar region with neurogenic claudication 05/22/2022   S/P cervical spinal fusion 01/02/2022   Mixed hyperlipidemia 02/02/2020   Abnormal LFTs 02/02/2020   Acute diarrhea 02/02/2020   Lactic acidosis  02/01/2020   Crohn's disease of colon with complication (HCC) 02/01/2020   Chronic diarrhea 02/01/2020   Coronary artery disease involving native coronary artery of native heart without angina pectoris 02/01/2020   Severe protein-calorie malnutrition (HCC) 08/08/2019   Hypokalemia 08/04/2019   AKI (acute kidney injury) (HCC)    Generalized weakness    Prolonged Q-T interval on ECG    Coronary syndrome, acute (HCC) 04/25/2018   STEMI (ST elevation myocardial infarction) (HCC) 10/26/2017   STEMI involving right coronary artery (HCC) 10/26/2017    PCP: Dr Donzetta Kohut  REFERRING PROVIDER: Sherryl Manges, NP  REFERRING DIAG: radiculopathy lumbar region   Rationale for Evaluation and Treatment: Rehabilitation  THERAPY DIAG:  Radiculopathy, lumbar region  Other symptoms and signs involving the musculoskeletal system  Muscle weakness (generalized)  ONSET DATE: 04/13/21; lumbar surgery 06/05/22  SUBJECTIVE:                                                                                                                                                                                           SUBJECTIVE STATEMENT: Patient reports that she has had back pain over the past 7-8 years. Her pain management doctor says she has a disease in her bones. Symptoms increased in the past 2-3 years. She had an operation" in the back 06/05/22 with minimal change in the pain in the L LE   PERTINENT HISTORY:  Surgery 06/05/22: LAMINECTOMY AND FORAMINOTOMY - LUMBAR FOUR-LUMBAR FIVE - LUMBAR FIVE-SACRAL ONE - LEFT; spinal fusion not sure of the level 2008; cervical disc surgery 12/2021; CAD; heart stent 2019; Chron's disease  PAIN:  Are you having pain? Yes: NPRS scale: 7/10; worse standing 10/10 Pain location: L LB into L posterior hip/buttock to lateral thigh to ankle  Pain description: nagging  Aggravating factors: standing; house work   Relieving factors: pain meds   PRECAUTIONS:  None  RED FLAGS: None   WEIGHT BEARING RESTRICTIONS: No  FALLS:  Has patient fallen in last 6 months? Yes. Number of falls 1  R LE gave away when she was stepping off the bus ~ 12/23/22 - no injury  LIVING ENVIRONMENT: Lives with: lives with their family Lives in: House/apartment Stairs: Yes: External: 14 steps;  can reach both Has following equipment at home: Single point cane and Walker - 2 wheeled  OCCUPATION: retired Psychologist, educational retired 2010; church, Therapist, music, household chores, cooking  PLOF: Independent  PATIENT GOALS: get L LE stronger   NEXT MD VISIT: none scheduled  OBJECTIVE:   DIAGNOSTIC FINDINGS:  Xray R hip 12/18/22: No acute fracture of the pelvis or right hip. Femoral head is seated in the acetabulum, no dislocation. Probable subchondral cyst in the lateral femoral head. Pubic rami are intact. Pubic symphysis and sacroiliac joints are congruent.  PATIENT SURVEYS:  FOTO 33; goal 48  SCREENING FOR RED FLAGS: Bowel or bladder incontinence: No Spinal tumors: No Cauda equina syndrome: No Compression fracture: No Abdominal aneurysm: No  COGNITION: Overall cognitive status: Within functional limits for tasks assessed     SENSATION: Tingling L and R LE on an intermittent basis   MUSCLE LENGTH: Hamstrings: Right 70 deg; Left 65 deg Thomas test:tight L   POSTURE: rounded shoulders, forward head, decreased lumbar lordosis, and flexed trunk   PALPATION: Muscular tightness L > R hip flexors; lumbar paraspinals; posterior hip to hamstrings   LUMBAR ROM:   AROM eval  Flexion 65%  Extension 30%  Right lateral flexion 75% pull L  Left lateral flexion 75%  Right rotation 70%  Left rotation 65%aching B LE's    (Blank rows = not tested)  LOWER EXTREMITY ROM:   end range tightness noted L > R hip   LOWER EXTREMITY MMT:    MMT Right eval Left eval  Hip flexion 4+ 4-  Hip extension 3+ 3-  Hip abduction 4+ 3+  Hip adduction    Hip internal  rotation    Hip external rotation    Knee flexion    Knee extension 5 4+  Ankle dorsiflexion    Ankle plantarflexion    Ankle inversion    Ankle eversion     (Blank rows = not tested)  LUMBAR SPECIAL TESTS:  Straight leg raise test: Negative tightness in the hamstring on L; Slump test: Negative  FUNCTIONAL TESTS:  5 times sit to stand: 31.03 using bilat UE's to push to get up and lower down  SLS - R 3 sec; L 1 sec   GAIT: Distance walked: 40 ft Assistive device utilized: None Level of assistance: Complete Independence Comments: antalgic gait with decreased wt shift to L LE in stance phase on L   [facility] Adult PT Treatment:                                                DATE: 01/07/23 Therapeutic Exercise: Prone  Partial press up to pt tolerance 2 sec x 5 Quad stretch with strap 30 sec x 2 L  Supine Piriformis stretch 30 sec x 2 L  Hamstring stretch w strap hooklying 30 sec x 2 L  Sciatic nerve glide x 10 L/R  Manual Therapy: Consider trial of DN  Therapeutic Activity: Suggested patient begin 5 min 2x/day walking in safe environment  Modalities: Trial of TENS unit for pain management  Self Care: Discussed importance of sitting in supported more upright posture - not in soft sofa as she does now     PATIENT EDUCATION:  Education details: POC; HEP  Person educated: Patient Education method: Explanation, Demonstration, Tactile cues, Verbal cues, and Handouts Education comprehension: verbalized understanding, returned  demonstration, verbal cues required, tactile cues required, and needs further education  HOME EXERCISE PROGRAM: Access Code: [facility] URL: [email or web address] Date: 01/07/2023 Prepared by: Corlis Leak  Exercises - Prone Press Up  - 2 x daily - 7 x weekly - 1 sets - 10 reps - 2-3 sec  hold - Prone Quadriceps Stretch with Strap  - 2 x daily - 7 x weekly - 1 sets - 3 reps - 30 sec  hold - Supine Piriformis Stretch with Leg Straight  - 2 x  daily - 7 x weekly - 1 sets - 3 reps - 30 sec  hold - Hooklying Hamstring Stretch with Strap  - 2 x daily - 7 x weekly - 1 sets - 3 reps - 30 sec  hold - Supine Sciatic Nerve Glide  - 2 x daily - 7 x weekly - 1 sets - 8-10 reps - 1-2 sec  hold  ASSESSMENT:  CLINICAL IMPRESSION: Patient is a 67 y.o. female who was seen today for physical therapy evaluation and treatment for L lumbar radiculopathy. Patient has a history of LBP over the course of the past 8 or more years. She has a lumbar fusion (unknown level) 2008; cervical disc surgery 12/2021; lumbar laminectomy and foraminectomy L4/L5/SI 06/05/22. Patient presents with poor posture and alignment; limited trunk and LE mobility and ROM; core and LE weakness; sedentary lifestyle; pain on a daily basis; limited functional activity level. Patient will benefit from PT to address problems identified.    OBJECTIVE IMPAIRMENTS: Abnormal gait, decreased activity tolerance, decreased balance, decreased endurance, decreased mobility, decreased ROM, decreased strength, increased fascial restrictions, increased muscle spasms, impaired flexibility, improper body mechanics, postural dysfunction, and pain.   ACTIVITY LIMITATIONS: carrying, lifting, bending, sitting, standing, squatting, sleeping, stairs, transfers, bed mobility, and locomotion level  PARTICIPATION LIMITATIONS: meal prep, cleaning, laundry, shopping, and community activity  PERSONAL FACTORS: Age, Behavior pattern, Education, Fitness, Past/current experiences, and Time since onset of injury/illness/exacerbation are also affecting patient's functional outcome.   REHAB POTENTIAL: Good  CLINICAL DECISION MAKING: Stable/uncomplicated  EVALUATION COMPLEXITY: Low   GOALS: Goals reviewed with patient? Yes  SHORT TERM GOALS: Target date: 02/18/2023   Independent in initial HEP  Baseline: Goal status: INITIAL  2.  Patient demonstrates and report improve sitting and standing postures/positions   Baseline:  Goal status: INITIAL    LONG TERM GOALS: Target date: 04/01/2023   Decrease pain 50-70% allowing patient to increase activity level Baseline:  Goal status: INITIAL  2.  Increase strength bilat LE's to 4/5 to 5/5  Baseline:  Goal status: INITIAL  3.  Increase core strength and stability allowing patient to increase activity level tolerating 20-30 min of exercise without change in pain level  Baseline:  Goal status: INITIAL  4.  Patient to demonstrate proper lifting techniques for functional lifts  Baseline:  Goal status: INITIAL  5.  Independent in HEP including aquatic program as indicated  Baseline:  Goal status: INITIAL  6.  Improve functional limitation score to 48  Baseline:  Goal status: INITIAL  PLAN:  PT FREQUENCY: 2x/week  PT DURATION: 12 weeks  PLANNED INTERVENTIONS: Therapeutic exercises, Therapeutic activity, Neuromuscular re-education, Balance training, Gait training, Patient/Family education, Self Care, Joint mobilization, Aquatic Therapy, Dry Needling, Electrical stimulation, Cryotherapy, Moist heat, Ultrasound, Ionotophoresis 4mg /ml Dexamethasone, Manual therapy, and Re-evaluation.  PLAN FOR NEXT SESSION: review and progress exercises; continue with back care education and correction; manual work, DN, modalities as indicated - trial of TENS for pain management; schedule aquatic therapy (  patient has participated in water exercise classes in the past)     Val Riles, PT 01/07/2023, 5:20 PM

## 2023-01-06 DIAGNOSIS — K5090 Crohn's disease, unspecified, without complications: Secondary | ICD-10-CM | POA: Diagnosis not present

## 2023-01-07 ENCOUNTER — Encounter: Payer: Self-pay | Admitting: Rehabilitative and Restorative Service Providers"

## 2023-01-07 ENCOUNTER — Ambulatory Visit: Payer: Medicare Other | Attending: Student | Admitting: Rehabilitative and Restorative Service Providers"

## 2023-01-07 ENCOUNTER — Other Ambulatory Visit: Payer: Self-pay

## 2023-01-07 DIAGNOSIS — M5416 Radiculopathy, lumbar region: Secondary | ICD-10-CM | POA: Insufficient documentation

## 2023-01-07 DIAGNOSIS — R29898 Other symptoms and signs involving the musculoskeletal system: Secondary | ICD-10-CM | POA: Insufficient documentation

## 2023-01-07 DIAGNOSIS — M6281 Muscle weakness (generalized): Secondary | ICD-10-CM | POA: Insufficient documentation

## 2023-01-14 ENCOUNTER — Ambulatory Visit: Payer: Medicare Other | Attending: Student

## 2023-01-14 DIAGNOSIS — M6281 Muscle weakness (generalized): Secondary | ICD-10-CM | POA: Insufficient documentation

## 2023-01-14 DIAGNOSIS — M5416 Radiculopathy, lumbar region: Secondary | ICD-10-CM | POA: Diagnosis not present

## 2023-01-14 DIAGNOSIS — R29898 Other symptoms and signs involving the musculoskeletal system: Secondary | ICD-10-CM | POA: Diagnosis not present

## 2023-01-14 NOTE — Therapy (Signed)
OUTPATIENT PHYSICAL THERAPY THORACOLUMBAR TREATMENT NOTE   Patient Name: Anne [name] MRN: [id number] DOB:1956-02-16, 67 y.o., female Today's Date: 01/14/2023  END OF SESSION:  PT End of Session - 01/14/23 1633     Visit Number 2    Date for PT Re-Evaluation 04/01/23    Authorization Type Medicare A & B BCBS secondary no authrequired    PT Start Time 1320    PT Stop Time 1400    PT Time Calculation (min) 40 min    Activity Tolerance Patient tolerated treatment well    Behavior During Therapy WFL for tasks assessed/performed             Past Medical History:  Diagnosis Date   Coronary artery disease    Crohn's colitis (HCC)    Crohn's disease (HCC)    GERD (gastroesophageal reflux disease)    High cholesterol    Hyperlipidemia    Hypertension    Hypokalemia 08/04/2019   STEMI (ST elevation myocardial infarction) (HCC)    10/27/17 PCI/DES x1 to the dRCA, with residual thrombus in the PLA, normal EF   Past Surgical History:  Procedure Laterality Date   ANTERIOR CERVICAL DECOMP/DISCECTOMY FUSION N/A 01/02/2022   Procedure: Anterior Cervical Decompression Fusion - Cervical three-Cervical four - Cervical four-Cervical five - Cervical five-Cervical six;  Surgeon: Tia Alert, MD;  Location: [facility] OR;  Service: Neurosurgery;  Laterality: N/A;   BACK SURGERY  2014   done in [place]   BIOPSY  09/22/2019   Procedure: BIOPSY;  Surgeon: [name], MD;  Location: [facility];  Service: Endoscopy;;   BIOPSY  05/29/2022   Procedure: BIOPSY;  Surgeon: [name], MD;  Location: [facility];  Service: Gastroenterology;;   CHOLECYSTECTOMY  2005   COLONOSCOPY WITH PROPOFOL N/A 09/22/2019   Procedure: COLONOSCOPY WITH PROPOFOL;  Surgeon: [name], MD;  Location: [facility];  Service: Endoscopy;  Laterality: N/A;   COLONOSCOPY WITH PROPOFOL N/A 05/29/2022   Procedure: COLONOSCOPY WITH PROPOFOL;  Surgeon: [name], MD;  Location: [facility];  Service:  Gastroenterology;  Laterality: N/A;   CORONARY/GRAFT ACUTE MI REVASCULARIZATION N/A 10/26/2017   Procedure: Coronary/Graft Acute MI Revascularization;  Surgeon: Lennette Bihari, MD;  Location: [facility] INVASIVE CV LAB;  Service: Cardiovascular;  Laterality: N/A;   FLEXIBLE SIGMOIDOSCOPY N/A 12/12/2021   Procedure: FLEXIBLE SIGMOIDOSCOPY;  Surgeon: [name], MD;  Location: [facility];  Service: Gastroenterology;  Laterality: N/A;   LEFT HEART CATH AND CORONARY ANGIOGRAPHY N/A 10/26/2017   Procedure: LEFT HEART CATH AND CORONARY ANGIOGRAPHY;  Surgeon: Lennette Bihari, MD;  Location: [facility] INVASIVE CV LAB;  Service: Cardiovascular;  Laterality: N/A;   LEFT HEART CATH AND CORONARY ANGIOGRAPHY Left 05/05/2018   Procedure: Left heart cath and coronary angiography;  Surgeon: Laurier Nancy, MD;  Location: [facility] INVASIVE CV LAB;  Service: Cardiovascular;  Laterality: Left;   LUMBAR LAMINECTOMY/DECOMPRESSION MICRODISCECTOMY Left 06/05/2022   Procedure: LAMINECTOMY AND FORAMINOTOMY - LUMBAR FOUR-LUMBAR FIVE - LUMBAR FIVE-SACRAL ONE - LEFT;  Surgeon: Tia Alert, MD;  Location: [facility] OR;  Service: Neurosurgery;  Laterality: Left;  3C   MUSCLE BIOPSY Left 08/10/2019   Procedure: THIGH MUSCLE BIOPSY;  Surgeon: Abigail Miyamoto, MD;  Location: [facility] OR;  Service: General;  Laterality: Left;   SMALL INTESTINE SURGERY     TONSILLECTOMY     removed as a child   Patient Active Problem List   Diagnosis Date Noted   Mediastinal mass 12/28/2022   Tobacco abuse 12/28/2022   Non-traumatic  rhabdomyolysis 12/18/2022   Rhabdomyolysis 12/18/2022   Chronic low blood pressure 11/26/2022   Back pain 11/26/2022   Encounter for smoking cessation counseling 11/26/2022   Health care maintenance 11/26/2022   Stage 3b chronic kidney disease (HCC) 10/09/2022   S/P lumbar laminectomy 06/05/2022   Spinal stenosis of lumbar region with neurogenic claudication 05/22/2022   S/P cervical spinal fusion 01/02/2022   Mixed  hyperlipidemia 02/02/2020   Abnormal LFTs 02/02/2020   Acute diarrhea 02/02/2020   Lactic acidosis 02/01/2020   Crohn's disease of colon with complication (HCC) 02/01/2020   Chronic diarrhea 02/01/2020   Coronary artery disease involving native coronary artery of native heart without angina pectoris 02/01/2020   Severe protein-calorie malnutrition (HCC) 08/08/2019   Hypokalemia 08/04/2019   AKI (acute kidney injury) (HCC)    Generalized weakness    Prolonged Q-T interval on ECG    Coronary syndrome, acute (HCC) 04/25/2018   STEMI (ST elevation myocardial infarction) (HCC) 10/26/2017   STEMI involving right coronary artery (HCC) 10/26/2017    PCP: Dr Donzetta Kohut  REFERRING PROVIDER: Sherryl Manges, NP  REFERRING DIAG: radiculopathy lumbar region   Rationale for Evaluation and Treatment: Rehabilitation  THERAPY DIAG:  Radiculopathy, lumbar region  Other symptoms and signs involving the musculoskeletal system  Muscle weakness (generalized)  ONSET DATE: 04/13/21; lumbar surgery 06/05/22  SUBJECTIVE:                                                                                                                                                                                           SUBJECTIVE STATEMENT: [name] returned to the clinic describing no major changes in status since initial visit last week. She stated that she continues to experience L low back pain when she does chores/activities around her home.   Eval: Patient reports that she has had back pain over the past 7-8 years. Her pain management doctor says she has a disease in her bones. Symptoms increased in the past 2-3 years. She had an operation" in the back 06/05/22 with minimal change in the pain in the L LE   PERTINENT HISTORY:  Surgery 06/05/22: LAMINECTOMY AND FORAMINOTOMY - LUMBAR FOUR-LUMBAR FIVE - LUMBAR FIVE-SACRAL ONE - LEFT; spinal fusion not sure of the level 2008; cervical disc surgery  12/2021; CAD; heart stent 2019; Chron's disease  PAIN:  Are you having pain? Yes: NPRS scale: 7/10; worse standing 10/10 Pain location: L LB into L posterior hip/buttock to lateral thigh to ankle  Pain description: nagging  Aggravating factors: standing; house work   Relieving factors: pain meds   PRECAUTIONS: None  RED FLAGS: None   WEIGHT  BEARING RESTRICTIONS: No  FALLS:  Has patient fallen in last 6 months? Yes. Number of falls 1  R LE gave away when she was stepping off the bus ~ 12/23/22 - no injury  LIVING ENVIRONMENT: Lives with: lives with their family Lives in: House/apartment Stairs: Yes: External: 14 steps; can reach both Has following equipment at home: Single point cane and Walker - 2 wheeled  OCCUPATION: retired Psychologist, educational retired 2010; church, Therapist, music, household chores, cooking  PLOF: Independent  PATIENT GOALS: get L LE stronger   NEXT MD VISIT: none scheduled  OBJECTIVE:   DIAGNOSTIC FINDINGS:  Xray R hip 12/18/22: No acute fracture of the pelvis or right hip. Femoral head is seated in the acetabulum, no dislocation. Probable subchondral cyst in the lateral femoral head. Pubic rami are intact. Pubic symphysis and sacroiliac joints are congruent.  PATIENT SURVEYS:  FOTO 33; goal 48  SCREENING FOR RED FLAGS: Bowel or bladder incontinence: No Spinal tumors: No Cauda equina syndrome: No Compression fracture: No Abdominal aneurysm: No  COGNITION: Overall cognitive status: Within functional limits for tasks assessed     SENSATION: Tingling L and R LE on an intermittent basis   MUSCLE LENGTH: Hamstrings: Right 70 deg; Left 65 deg Thomas test:tight L   POSTURE: rounded shoulders, forward head, decreased lumbar lordosis, and flexed trunk   PALPATION: Muscular tightness L > R hip flexors; lumbar paraspinals; posterior hip to hamstrings   LUMBAR ROM:   AROM eval  Flexion 65%  Extension 30%  Right lateral flexion 75% pull L  Left  lateral flexion 75%  Right rotation 70%  Left rotation 65%aching B LE's    (Blank rows = not tested)  LOWER EXTREMITY ROM:   end range tightness noted L > R hip   LOWER EXTREMITY MMT:    MMT Right eval Left eval  Hip flexion 4+ 4-  Hip extension 3+ 3-  Hip abduction 4+ 3+  Hip adduction    Hip internal rotation    Hip external rotation    Knee flexion    Knee extension 5 4+  Ankle dorsiflexion    Ankle plantarflexion    Ankle inversion    Ankle eversion     (Blank rows = not tested)  LUMBAR SPECIAL TESTS:  Straight leg raise test: Negative tightness in the hamstring on L; Slump test: Negative  FUNCTIONAL TESTS:  5 times sit to stand: 31.03 using bilat UE's to push to get up and lower down  SLS - R 3 sec; L 1 sec   GAIT: Distance walked: 40 ft Assistive device utilized: None Level of assistance: Complete Independence Comments: antalgic gait with decreased wt shift to L LE in stance phase on L   [facility] Adult PT Treatment:                                                DATE: 01/07/23 Therapeutic Exercise: R sidelying: Manual L hip stretching into flexion and flexion + external rotation Standing - trialed multiple exercises to get patient to load into L lateral hip including: L hip drop from 4" step (reaching R foot to floor without bending L knee) L hip shift hip hinge  L foot on step, R foot forward, backward L hip shift R foot on step in external rotation, R femoral pull in toward L hip L foot  forward L hip shift + contralateral trunk side flexion Manual Therapy: R sidelying: Soft tissue mobilization of the L posterolateral hip musculature Soft tissue mobilization of the L lumbar paraspinals (medial aspect along spinous processes) Therapeutic Activity: Suggested patient begin 5 min 2x/day walking in safe environment  Modalities: Trial of TENS unit for pain management  Self Care: Discussed importance of sitting in supported more upright posture - not in soft sofa  as she does now   [facility] Adult PT Treatment:                                                DATE: 01/07/23 Therapeutic Exercise: Prone  Partial press up to pt tolerance 2 sec x 5 Quad stretch with strap 30 sec x 2 L  Supine Piriformis stretch 30 sec x 2 L  Hamstring stretch w strap hooklying 30 sec x 2 L  Sciatic nerve glide x 10 L/R  Manual Therapy: Consider trial of DN  Therapeutic Activity: Suggested patient begin 5 min 2x/day walking in safe environment  Modalities: Trial of TENS unit for pain management  Self Care: Discussed importance of sitting in supported more upright posture - not in soft sofa as she does now   PATIENT EDUCATION:  Education details: POC; HEP  Person educated: Patient Education method: Explanation, Demonstration, Tactile cues, Verbal cues, and Handouts Education comprehension: verbalized understanding, returned demonstration, verbal cues required, tactile cues required, and needs further education  HOME EXERCISE PROGRAM: Access Code: [facility] URL: [email or web address] Date: 01/07/2023 Prepared by: Corlis Leak  Exercises - Prone Press Up  - 2 x daily - 7 x weekly - 1 sets - 10 reps - 2-3 sec  hold - Prone Quadriceps Stretch with Strap  - 2 x daily - 7 x weekly - 1 sets - 3 reps - 30 sec  hold - Supine Piriformis Stretch with Leg Straight  - 2 x daily - 7 x weekly - 1 sets - 3 reps - 30 sec  hold - Hooklying Hamstring Stretch with Strap  - 2 x daily - 7 x weekly - 1 sets - 3 reps - 30 sec  hold - Supine Sciatic Nerve Glide  - 2 x daily - 7 x weekly - 1 sets - 8-10 reps - 1-2 sec  hold  ASSESSMENT:  CLINICAL IMPRESSION: Symptoms most reproduced today with palpation of soft tissues of the L lumbar paraspinals and the L posterolateral hip. Addressed these areas with soft tissue mobilization followed by manual stretching of the L posterolateral hip. Trialed multiple exercises to get patient to load/lengthen into the L posterolateral hip,  however, was unable to find an effective method of doing so as patient avoided loading by flexing or rotating at the trunk or flexing at the knee. Because of this, did not provide a home exercise for lateral hip loading/lengthening today. Physical therapy remains indicated.   Eval: Patient is a 67 y.o. female who was seen today for physical therapy evaluation and treatment for L lumbar radiculopathy. Patient has a history of LBP over the course of the past 8 or more years. She has a lumbar fusion (unknown level) 2008; cervical disc surgery 12/2021; lumbar laminectomy and foraminectomy L4/L5/SI 06/05/22. Patient presents with poor posture and alignment; limited trunk and LE mobility and ROM; core and LE weakness; sedentary lifestyle; pain on a daily basis; limited functional  activity level. Patient will benefit from PT to address problems identified.    OBJECTIVE IMPAIRMENTS: Abnormal gait, decreased activity tolerance, decreased balance, decreased endurance, decreased mobility, decreased ROM, decreased strength, increased fascial restrictions, increased muscle spasms, impaired flexibility, improper body mechanics, postural dysfunction, and pain.   ACTIVITY LIMITATIONS: carrying, lifting, bending, sitting, standing, squatting, sleeping, stairs, transfers, bed mobility, and locomotion level  PARTICIPATION LIMITATIONS: meal prep, cleaning, laundry, shopping, and community activity  PERSONAL FACTORS: Age, Behavior pattern, Education, Fitness, Past/current experiences, and Time since onset of injury/illness/exacerbation are also affecting patient's functional outcome.   REHAB POTENTIAL: Good  CLINICAL DECISION MAKING: Stable/uncomplicated  EVALUATION COMPLEXITY: Low   GOALS: Goals reviewed with patient? Yes  SHORT TERM GOALS: Target date: 02/18/2023   Independent in initial HEP  Baseline: Goal status: INITIAL  2.  Patient demonstrates and report improve sitting and standing postures/positions   Baseline:  Goal status: INITIAL    LONG TERM GOALS: Target date: 04/01/2023   Decrease pain 50-70% allowing patient to increase activity level Baseline:  Goal status: INITIAL  2.  Increase strength bilat LE's to 4/5 to 5/5  Baseline:  Goal status: INITIAL  3.  Increase core strength and stability allowing patient to increase activity level tolerating 20-30 min of exercise without change in pain level  Baseline:  Goal status: INITIAL  4.  Patient to demonstrate proper lifting techniques for functional lifts  Baseline:  Goal status: INITIAL  5.  Independent in HEP including aquatic program as indicated  Baseline:  Goal status: INITIAL  6.  Improve functional limitation score to 48  Baseline:  Goal status: INITIAL  PLAN:  PT FREQUENCY: 2x/week  PT DURATION: 12 weeks  PLANNED INTERVENTIONS: Therapeutic exercises, Therapeutic activity, Neuromuscular re-education, Balance training, Gait training, Patient/Family education, Self Care, Joint mobilization, Aquatic Therapy, Dry Needling, Electrical stimulation, Cryotherapy, Moist heat, Ultrasound, Ionotophoresis 4mg /ml Dexamethasone, Manual therapy, and Re-evaluation.  PLAN FOR NEXT SESSION: review and progress exercises; continue with back care education and correction; manual work, DN, modalities as indicated - trial of TENS for pain management; schedule aquatic therapy (patient has participated in water exercise classes in the past)     Clelia Schaumann, PT 01/14/2023, 4:35 PM

## 2023-01-15 DIAGNOSIS — M542 Cervicalgia: Secondary | ICD-10-CM | POA: Diagnosis not present

## 2023-01-15 DIAGNOSIS — M545 Low back pain, unspecified: Secondary | ICD-10-CM | POA: Diagnosis not present

## 2023-01-15 DIAGNOSIS — Z79899 Other long term (current) drug therapy: Secondary | ICD-10-CM | POA: Diagnosis not present

## 2023-01-15 DIAGNOSIS — E7800 Pure hypercholesterolemia, unspecified: Secondary | ICD-10-CM | POA: Diagnosis not present

## 2023-01-15 DIAGNOSIS — R310 Gross hematuria: Secondary | ICD-10-CM | POA: Diagnosis not present

## 2023-01-15 DIAGNOSIS — E559 Vitamin D deficiency, unspecified: Secondary | ICD-10-CM | POA: Diagnosis not present

## 2023-01-15 DIAGNOSIS — Z131 Encounter for screening for diabetes mellitus: Secondary | ICD-10-CM | POA: Diagnosis not present

## 2023-01-15 DIAGNOSIS — Z6822 Body mass index (BMI) 22.0-22.9, adult: Secondary | ICD-10-CM | POA: Diagnosis not present

## 2023-01-15 DIAGNOSIS — R5383 Other fatigue: Secondary | ICD-10-CM | POA: Diagnosis not present

## 2023-01-21 ENCOUNTER — Other Ambulatory Visit: Payer: Self-pay

## 2023-01-21 ENCOUNTER — Ambulatory Visit: Payer: Medicare Other

## 2023-01-21 DIAGNOSIS — M6281 Muscle weakness (generalized): Secondary | ICD-10-CM

## 2023-01-21 DIAGNOSIS — M5416 Radiculopathy, lumbar region: Secondary | ICD-10-CM

## 2023-01-21 DIAGNOSIS — R29898 Other symptoms and signs involving the musculoskeletal system: Secondary | ICD-10-CM | POA: Diagnosis not present

## 2023-01-21 DIAGNOSIS — Z79899 Other long term (current) drug therapy: Secondary | ICD-10-CM | POA: Diagnosis not present

## 2023-01-21 NOTE — Therapy (Signed)
OUTPATIENT PHYSICAL THERAPY THORACOLUMBAR TREATMENT NOTE   Patient Name: Anne [name] MRN: [id number] DOB:11-13-1955, 67 y.o., female Today's Date: 01/21/2023  END OF SESSION:  PT End of Session - 01/21/23 1729     Visit Number 3    Number of Visits 3    Date for PT Re-Evaluation 04/01/23    Authorization Type Medicare A & B BCBS secondary no authrequired    Progress Note Due on Visit 10    PT Start Time 1620    PT Stop Time 1700    PT Time Calculation (min) 40 min    Activity Tolerance Patient tolerated treatment well    Behavior During Therapy WFL for tasks assessed/performed              Past Medical History:  Diagnosis Date   Coronary artery disease    Crohn's colitis (HCC)    Crohn's disease (HCC)    GERD (gastroesophageal reflux disease)    High cholesterol    Hyperlipidemia    Hypertension    Hypokalemia 08/04/2019   STEMI (ST elevation myocardial infarction) (HCC)    10/27/17 PCI/DES x1 to the dRCA, with residual thrombus in the PLA, normal EF   Past Surgical History:  Procedure Laterality Date   ANTERIOR CERVICAL DECOMP/DISCECTOMY FUSION N/A 01/02/2022   Procedure: Anterior Cervical Decompression Fusion - Cervical three-Cervical four - Cervical four-Cervical five - Cervical five-Cervical six;  Surgeon: Tia Alert, MD;  Location: [facility] OR;  Service: Neurosurgery;  Laterality: N/A;   BACK SURGERY  2014   done in [place]   BIOPSY  09/22/2019   Procedure: BIOPSY;  Surgeon: [name], MD;  Location: [facility];  Service: Endoscopy;;   BIOPSY  05/29/2022   Procedure: BIOPSY;  Surgeon: [name], MD;  Location: [facility];  Service: Gastroenterology;;   CHOLECYSTECTOMY  2005   COLONOSCOPY WITH PROPOFOL N/A 09/22/2019   Procedure: COLONOSCOPY WITH PROPOFOL;  Surgeon: [name], MD;  Location: [facility];  Service: Endoscopy;  Laterality: N/A;   COLONOSCOPY WITH PROPOFOL N/A 05/29/2022   Procedure: COLONOSCOPY WITH PROPOFOL;  Surgeon: [name], MD;  Location: [facility];  Service: Gastroenterology;  Laterality: N/A;   CORONARY/GRAFT ACUTE MI REVASCULARIZATION N/A 10/26/2017   Procedure: Coronary/Graft Acute MI Revascularization;  Surgeon: Lennette Bihari, MD;  Location: [facility] INVASIVE CV LAB;  Service: Cardiovascular;  Laterality: N/A;   FLEXIBLE SIGMOIDOSCOPY N/A 12/12/2021   Procedure: FLEXIBLE SIGMOIDOSCOPY;  Surgeon: [name], MD;  Location: [facility];  Service: Gastroenterology;  Laterality: N/A;   LEFT HEART CATH AND CORONARY ANGIOGRAPHY N/A 10/26/2017   Procedure: LEFT HEART CATH AND CORONARY ANGIOGRAPHY;  Surgeon: Lennette Bihari, MD;  Location: [facility] INVASIVE CV LAB;  Service: Cardiovascular;  Laterality: N/A;   LEFT HEART CATH AND CORONARY ANGIOGRAPHY Left 05/05/2018   Procedure: Left heart cath and coronary angiography;  Surgeon: Laurier Nancy, MD;  Location: [facility] INVASIVE CV LAB;  Service: Cardiovascular;  Laterality: Left;   LUMBAR LAMINECTOMY/DECOMPRESSION MICRODISCECTOMY Left 06/05/2022   Procedure: LAMINECTOMY AND FORAMINOTOMY - LUMBAR FOUR-LUMBAR FIVE - LUMBAR FIVE-SACRAL ONE - LEFT;  Surgeon: Tia Alert, MD;  Location: [facility] OR;  Service: Neurosurgery;  Laterality: Left;  3C   MUSCLE BIOPSY Left 08/10/2019   Procedure: THIGH MUSCLE BIOPSY;  Surgeon: Abigail Miyamoto, MD;  Location: [facility] OR;  Service: General;  Laterality: Left;   SMALL INTESTINE SURGERY     TONSILLECTOMY     removed as a child   Patient Active Problem List  Diagnosis Date Noted   Mediastinal mass 12/28/2022   Tobacco abuse 12/28/2022   Non-traumatic rhabdomyolysis 12/18/2022   Rhabdomyolysis 12/18/2022   Chronic low blood pressure 11/26/2022   Back pain 11/26/2022   Encounter for smoking cessation counseling 11/26/2022   Health care maintenance 11/26/2022   Stage 3b chronic kidney disease (HCC) 10/09/2022   S/P lumbar laminectomy 06/05/2022   Spinal stenosis of lumbar region with neurogenic claudication 05/22/2022   S/P  cervical spinal fusion 01/02/2022   Mixed hyperlipidemia 02/02/2020   Abnormal LFTs 02/02/2020   Acute diarrhea 02/02/2020   Lactic acidosis 02/01/2020   Crohn's disease of colon with complication (HCC) 02/01/2020   Chronic diarrhea 02/01/2020   Coronary artery disease involving native coronary artery of native heart without angina pectoris 02/01/2020   Severe protein-calorie malnutrition (HCC) 08/08/2019   Hypokalemia 08/04/2019   AKI (acute kidney injury) (HCC)    Generalized weakness    Prolonged Q-T interval on ECG    Coronary syndrome, acute (HCC) 04/25/2018   STEMI (ST elevation myocardial infarction) (HCC) 10/26/2017   STEMI involving right coronary artery (HCC) 10/26/2017    PCP: Dr Donzetta Kohut  REFERRING PROVIDER: Sherryl Manges, NP  REFERRING DIAG: radiculopathy lumbar region   Rationale for Evaluation and Treatment: Rehabilitation  THERAPY DIAG:  Radiculopathy, lumbar region  Other symptoms and signs involving the musculoskeletal system  Muscle weakness (generalized)  ONSET DATE: 04/13/21; lumbar surgery 06/05/22  SUBJECTIVE:                                                                                                                                                                                           SUBJECTIVE STATEMENT: Anne [name] returned to the clinic stating that her pain is OK today. She thought the treatments performed were helpful, though, she continues to experience pain with activity doing chores around the house.  Eval: Patient reports that she has had back pain over the past 7-8 years. Her pain management doctor says she has a disease in her bones. Symptoms increased in the past 2-3 years. She had an operation" in the back 06/05/22 with minimal change in the pain in the L LE   PERTINENT HISTORY:  Surgery 06/05/22: LAMINECTOMY AND FORAMINOTOMY - LUMBAR FOUR-LUMBAR FIVE - LUMBAR FIVE-SACRAL ONE - LEFT; spinal fusion not sure of the  level 2008; cervical disc surgery 12/2021; CAD; heart stent 2019; Chron's disease  PAIN:  Are you having pain? Yes: NPRS scale: 7/10; worse standing 10/10 Pain location: L LB into L posterior hip/buttock to lateral thigh to ankle  Pain description: nagging  Aggravating factors: standing; house work   Relieving factors:  pain meds   PRECAUTIONS: None  RED FLAGS: None   WEIGHT BEARING RESTRICTIONS: No  FALLS:  Has patient fallen in last 6 months? Yes. Number of falls 1  R LE gave away when she was stepping off the bus ~ 12/23/22 - no injury  LIVING ENVIRONMENT: Lives with: lives with their family Lives in: House/apartment Stairs: Yes: External: 14 steps; can reach both Has following equipment at home: Single point cane and Walker - 2 wheeled  OCCUPATION: retired Psychologist, educational retired 2010; church, Therapist, music, household chores, cooking  PLOF: Independent  PATIENT GOALS: get L LE stronger   NEXT MD VISIT: none scheduled  OBJECTIVE:   DIAGNOSTIC FINDINGS:  Xray R hip 12/18/22: No acute fracture of the pelvis or right hip. Femoral head is seated in the acetabulum, no dislocation. Probable subchondral cyst in the lateral femoral head. Pubic rami are intact. Pubic symphysis and sacroiliac joints are congruent.  PATIENT SURVEYS:  FOTO 33; goal 48  SCREENING FOR RED FLAGS: Bowel or bladder incontinence: No Spinal tumors: No Cauda equina syndrome: No Compression fracture: No Abdominal aneurysm: No  COGNITION: Overall cognitive status: Within functional limits for tasks assessed     SENSATION: Tingling L and R LE on an intermittent basis   MUSCLE LENGTH: Hamstrings: Right 70 deg; Left 65 deg Thomas test:tight L   POSTURE: rounded shoulders, forward head, decreased lumbar lordosis, and flexed trunk   PALPATION: Muscular tightness L > R hip flexors; lumbar paraspinals; posterior hip to hamstrings   LUMBAR ROM:   AROM eval  Flexion 65%  Extension 30%  Right  lateral flexion 75% pull L  Left lateral flexion 75%  Right rotation 70%  Left rotation 65%aching B LE's    (Blank rows = not tested)  LOWER EXTREMITY ROM:   end range tightness noted L > R hip   LOWER EXTREMITY MMT:    MMT Right eval Left eval  Hip flexion 4+ 4-  Hip extension 3+ 3-  Hip abduction 4+ 3+  Hip adduction    Hip internal rotation    Hip external rotation    Knee flexion    Knee extension 5 4+  Ankle dorsiflexion    Ankle plantarflexion    Ankle inversion    Ankle eversion     (Blank rows = not tested)  LUMBAR SPECIAL TESTS:  Straight leg raise test: Negative tightness in the hamstring on L; Slump test: Negative  FUNCTIONAL TESTS:  5 times sit to stand: 31.03 using bilat UE's to push to get up and lower down  SLS - R 3 sec; L 1 sec   GAIT: Distance walked: 40 ft Assistive device utilized: None Level of assistance: Complete Independence Comments: antalgic gait with decreased wt shift to L LE in stance phase on L    [facility] Adult PT Treatment:                                                DATE: 01/21/23 Therapeutic Exercise: R sidelying: Manual L hip stretching into adduction blocking pelvis to target L lateral hip Supine: Manual L hip stretching into adduction (R foot crossed over L leg) to target L lateral hip Standing: L lateral hip stretch (L foot placed behind R, R hand on wall for balance, reaching L hand overhead to wall) L hip drop from 4" step (reaching  R foot to floor without bending L knee, R hand on wall for balance) reaching L hand overhead to wall Manual Therapy: R sidelying: Soft tissue mobilization of the L posterolateral hip musculature  [facility] Adult PT Treatment:                                                DATE: 01/14/23 Therapeutic Exercise: R sidelying: Manual L hip stretching into flexion and flexion + external rotation Standing - trialed multiple exercises to get patient to load into L lateral hip including: L hip drop from  4" step (reaching R foot to floor without bending L knee) L hip shift hip hinge  L foot on step, R foot forward, backward L hip shift R foot on step in external rotation, R femoral pull in toward L hip L foot forward L hip shift + contralateral trunk side flexion Manual Therapy: R sidelying: Soft tissue mobilization of the L posterolateral hip musculature Soft tissue mobilization of the L lumbar paraspinals (medial aspect along spinous processes)  [facility] Adult PT Treatment:                                                DATE: 01/07/23 Therapeutic Exercise: Prone  Partial press up to pt tolerance 2 sec x 5 Quad stretch with strap 30 sec x 2 L  Supine Piriformis stretch 30 sec x 2 L  Hamstring stretch w strap hooklying 30 sec x 2 L  Sciatic nerve glide x 10 L/R  Manual Therapy: Consider trial of DN  Therapeutic Activity: Suggested patient begin 5 min 2x/day walking in safe environment  Modalities: Trial of TENS unit for pain management  Self Care: Discussed importance of sitting in supported more upright posture - not in soft sofa as she does now   PATIENT EDUCATION:  Education details: POC; HEP  Person educated: Patient Education method: Explanation, Demonstration, Tactile cues, Verbal cues, and Handouts Education comprehension: verbalized understanding, returned demonstration, verbal cues required, tactile cues required, and needs further education  HOME EXERCISE PROGRAM: Access Code: [facility] URL: [email or web address] Date: 01/21/2023 Prepared by: Edmonia Caprio  Program Notes Added (1) L lateral hip stretch (L foot placed behind R, R hand on wall for balance, reaching L hand overhead to wall) (2) L hip drop from 4" step (reaching R foot to floor without bending L knee, R hand on wall for balance) reaching L hand overhead to wall  Exercises - Prone Press Up  - 2 x daily - 7 x weekly - 1 sets - 10 reps - 2-3 sec  hold - Prone Quadriceps Stretch with Strap  - 2 x  daily - 7 x weekly - 1 sets - 3 reps - 30 sec  hold - Supine Piriformis Stretch with Leg Straight  - 2 x daily - 7 x weekly - 1 sets - 3 reps - 30 sec  hold - Hooklying Hamstring Stretch with Strap  - 2 x daily - 7 x weekly - 1 sets - 3 reps - 30 sec  hold - Supine Sciatic Nerve Glide  - 2 x daily - 7 x weekly - 1 sets - 8-10 reps - 1-2 sec  hold  ASSESSMENT:  CLINICAL IMPRESSION: Less  pain with palpation at L lumbar paraspinals today but L posterolateral hip remains painful to palpation. Most stretch at area of chief complaint achieved today with L hip adduction near a neutral position of hip extension. Was able to find two exercises to stretch/load into the L lateral hip today without compensation and added to home program. Physical therapy remains indicated.    Eval: Patient is a 67 y.o. female who was seen today for physical therapy evaluation and treatment for L lumbar radiculopathy. Patient has a history of LBP over the course of the past 8 or more years. She has a lumbar fusion (unknown level) 2008; cervical disc surgery 12/2021; lumbar laminectomy and foraminectomy L4/L5/SI 06/05/22. Patient presents with poor posture and alignment; limited trunk and LE mobility and ROM; core and LE weakness; sedentary lifestyle; pain on a daily basis; limited functional activity level. Patient will benefit from PT to address problems identified.    OBJECTIVE IMPAIRMENTS: Abnormal gait, decreased activity tolerance, decreased balance, decreased endurance, decreased mobility, decreased ROM, decreased strength, increased fascial restrictions, increased muscle spasms, impaired flexibility, improper body mechanics, postural dysfunction, and pain.   ACTIVITY LIMITATIONS: carrying, lifting, bending, sitting, standing, squatting, sleeping, stairs, transfers, bed mobility, and locomotion level  PARTICIPATION LIMITATIONS: meal prep, cleaning, laundry, shopping, and community activity  PERSONAL FACTORS: Age, Behavior  pattern, Education, Fitness, Past/current experiences, and Time since onset of injury/illness/exacerbation are also affecting patient's functional outcome.   REHAB POTENTIAL: Good  CLINICAL DECISION MAKING: Stable/uncomplicated  EVALUATION COMPLEXITY: Low   GOALS: Goals reviewed with patient? Yes  SHORT TERM GOALS: Target date: 02/18/2023   Independent in initial HEP  Baseline: Goal status: INITIAL  2.  Patient demonstrates and report improve sitting and standing postures/positions  Baseline:  Goal status: INITIAL    LONG TERM GOALS: Target date: 04/01/2023   Decrease pain 50-70% allowing patient to increase activity level Baseline:  Goal status: INITIAL  2.  Increase strength bilat LE's to 4/5 to 5/5  Baseline:  Goal status: INITIAL  3.  Increase core strength and stability allowing patient to increase activity level tolerating 20-30 min of exercise without change in pain level  Baseline:  Goal status: INITIAL  4.  Patient to demonstrate proper lifting techniques for functional lifts  Baseline:  Goal status: INITIAL  5.  Independent in HEP including aquatic program as indicated  Baseline:  Goal status: INITIAL  6.  Improve functional limitation score to 48  Baseline:  Goal status: INITIAL  PLAN:  PT FREQUENCY: 2x/week  PT DURATION: 12 weeks  PLANNED INTERVENTIONS: Therapeutic exercises, Therapeutic activity, Neuromuscular re-education, Balance training, Gait training, Patient/Family education, Self Care, Joint mobilization, Aquatic Therapy, Dry Needling, Electrical stimulation, Cryotherapy, Moist heat, Ultrasound, Ionotophoresis 4mg /ml Dexamethasone, Manual therapy, and Re-evaluation.  PLAN FOR NEXT SESSION: review and progress exercises; continue with back care education and correction; manual work, DN, modalities as indicated - trial of TENS for pain management; schedule aquatic therapy (patient has participated in water exercise classes in the past)      Clelia Schaumann, PT 01/21/2023, 5:37 PM

## 2023-02-04 DIAGNOSIS — H40033 Anatomical narrow angle, bilateral: Secondary | ICD-10-CM | POA: Diagnosis not present

## 2023-02-04 DIAGNOSIS — H2512 Age-related nuclear cataract, left eye: Secondary | ICD-10-CM | POA: Diagnosis not present

## 2023-02-05 ENCOUNTER — Ambulatory Visit: Payer: Medicare Other

## 2023-02-12 ENCOUNTER — Other Ambulatory Visit: Payer: Self-pay | Admitting: Internal Medicine

## 2023-02-12 DIAGNOSIS — I2510 Atherosclerotic heart disease of native coronary artery without angina pectoris: Secondary | ICD-10-CM

## 2023-02-13 DIAGNOSIS — E876 Hypokalemia: Secondary | ICD-10-CM | POA: Diagnosis present

## 2023-02-13 DIAGNOSIS — N1832 Chronic kidney disease, stage 3b: Secondary | ICD-10-CM | POA: Diagnosis present

## 2023-02-13 DIAGNOSIS — B9689 Other specified bacterial agents as the cause of diseases classified elsewhere: Secondary | ICD-10-CM | POA: Diagnosis present

## 2023-02-13 DIAGNOSIS — R630 Anorexia: Secondary | ICD-10-CM | POA: Diagnosis present

## 2023-02-13 DIAGNOSIS — K219 Gastro-esophageal reflux disease without esophagitis: Secondary | ICD-10-CM | POA: Diagnosis present

## 2023-02-13 DIAGNOSIS — Z6823 Body mass index (BMI) 23.0-23.9, adult: Secondary | ICD-10-CM | POA: Diagnosis not present

## 2023-02-13 DIAGNOSIS — F17210 Nicotine dependence, cigarettes, uncomplicated: Secondary | ICD-10-CM | POA: Diagnosis present

## 2023-02-13 DIAGNOSIS — R059 Cough, unspecified: Secondary | ICD-10-CM | POA: Diagnosis not present

## 2023-02-13 DIAGNOSIS — N179 Acute kidney failure, unspecified: Secondary | ICD-10-CM | POA: Diagnosis present

## 2023-02-13 DIAGNOSIS — E038 Other specified hypothyroidism: Secondary | ICD-10-CM | POA: Diagnosis not present

## 2023-02-13 DIAGNOSIS — R0902 Hypoxemia: Secondary | ICD-10-CM | POA: Diagnosis present

## 2023-02-13 DIAGNOSIS — J9809 Other diseases of bronchus, not elsewhere classified: Secondary | ICD-10-CM | POA: Diagnosis not present

## 2023-02-13 DIAGNOSIS — I2510 Atherosclerotic heart disease of native coronary artery without angina pectoris: Secondary | ICD-10-CM | POA: Diagnosis present

## 2023-02-13 DIAGNOSIS — Z79899 Other long term (current) drug therapy: Secondary | ICD-10-CM | POA: Diagnosis not present

## 2023-02-13 DIAGNOSIS — Z1152 Encounter for screening for COVID-19: Secondary | ICD-10-CM | POA: Diagnosis not present

## 2023-02-13 DIAGNOSIS — R918 Other nonspecific abnormal finding of lung field: Secondary | ICD-10-CM | POA: Diagnosis not present

## 2023-02-13 DIAGNOSIS — D72829 Elevated white blood cell count, unspecified: Secondary | ICD-10-CM | POA: Diagnosis present

## 2023-02-13 DIAGNOSIS — J42 Unspecified chronic bronchitis: Secondary | ICD-10-CM | POA: Diagnosis present

## 2023-02-13 DIAGNOSIS — R0989 Other specified symptoms and signs involving the circulatory and respiratory systems: Secondary | ICD-10-CM | POA: Diagnosis not present

## 2023-02-13 DIAGNOSIS — R7401 Elevation of levels of liver transaminase levels: Secondary | ICD-10-CM | POA: Diagnosis present

## 2023-02-13 DIAGNOSIS — N281 Cyst of kidney, acquired: Secondary | ICD-10-CM | POA: Diagnosis present

## 2023-02-13 DIAGNOSIS — E7800 Pure hypercholesterolemia, unspecified: Secondary | ICD-10-CM | POA: Diagnosis present

## 2023-02-13 DIAGNOSIS — I129 Hypertensive chronic kidney disease with stage 1 through stage 4 chronic kidney disease, or unspecified chronic kidney disease: Secondary | ICD-10-CM | POA: Diagnosis present

## 2023-02-13 DIAGNOSIS — J439 Emphysema, unspecified: Secondary | ICD-10-CM | POA: Diagnosis not present

## 2023-02-13 DIAGNOSIS — I252 Old myocardial infarction: Secondary | ICD-10-CM | POA: Diagnosis not present

## 2023-02-13 DIAGNOSIS — K5010 Crohn's disease of large intestine without complications: Secondary | ICD-10-CM | POA: Diagnosis present

## 2023-02-14 DIAGNOSIS — J42 Unspecified chronic bronchitis: Secondary | ICD-10-CM | POA: Diagnosis not present

## 2023-02-15 DIAGNOSIS — I252 Old myocardial infarction: Secondary | ICD-10-CM | POA: Diagnosis not present

## 2023-02-15 DIAGNOSIS — K5010 Crohn's disease of large intestine without complications: Secondary | ICD-10-CM | POA: Diagnosis present

## 2023-02-15 DIAGNOSIS — R0902 Hypoxemia: Secondary | ICD-10-CM | POA: Diagnosis present

## 2023-02-15 DIAGNOSIS — I2510 Atherosclerotic heart disease of native coronary artery without angina pectoris: Secondary | ICD-10-CM | POA: Diagnosis present

## 2023-02-15 DIAGNOSIS — N281 Cyst of kidney, acquired: Secondary | ICD-10-CM | POA: Diagnosis present

## 2023-02-15 DIAGNOSIS — N179 Acute kidney failure, unspecified: Secondary | ICD-10-CM | POA: Diagnosis present

## 2023-02-15 DIAGNOSIS — Z6823 Body mass index (BMI) 23.0-23.9, adult: Secondary | ICD-10-CM | POA: Diagnosis not present

## 2023-02-15 DIAGNOSIS — B9689 Other specified bacterial agents as the cause of diseases classified elsewhere: Secondary | ICD-10-CM | POA: Diagnosis present

## 2023-02-15 DIAGNOSIS — I129 Hypertensive chronic kidney disease with stage 1 through stage 4 chronic kidney disease, or unspecified chronic kidney disease: Secondary | ICD-10-CM | POA: Diagnosis present

## 2023-02-15 DIAGNOSIS — N1832 Chronic kidney disease, stage 3b: Secondary | ICD-10-CM | POA: Diagnosis present

## 2023-02-15 DIAGNOSIS — Z79899 Other long term (current) drug therapy: Secondary | ICD-10-CM | POA: Diagnosis not present

## 2023-02-15 DIAGNOSIS — D72829 Elevated white blood cell count, unspecified: Secondary | ICD-10-CM | POA: Diagnosis present

## 2023-02-15 DIAGNOSIS — Z1152 Encounter for screening for COVID-19: Secondary | ICD-10-CM | POA: Diagnosis not present

## 2023-02-15 DIAGNOSIS — R630 Anorexia: Secondary | ICD-10-CM | POA: Diagnosis present

## 2023-02-15 DIAGNOSIS — F17210 Nicotine dependence, cigarettes, uncomplicated: Secondary | ICD-10-CM | POA: Diagnosis present

## 2023-02-15 DIAGNOSIS — E038 Other specified hypothyroidism: Secondary | ICD-10-CM | POA: Diagnosis not present

## 2023-02-15 DIAGNOSIS — J42 Unspecified chronic bronchitis: Secondary | ICD-10-CM | POA: Diagnosis present

## 2023-02-15 DIAGNOSIS — E876 Hypokalemia: Secondary | ICD-10-CM | POA: Diagnosis present

## 2023-02-15 DIAGNOSIS — E7800 Pure hypercholesterolemia, unspecified: Secondary | ICD-10-CM | POA: Diagnosis present

## 2023-02-15 DIAGNOSIS — K219 Gastro-esophageal reflux disease without esophagitis: Secondary | ICD-10-CM | POA: Diagnosis present

## 2023-02-15 DIAGNOSIS — R7401 Elevation of levels of liver transaminase levels: Secondary | ICD-10-CM | POA: Diagnosis present

## 2023-02-17 DIAGNOSIS — Z6822 Body mass index (BMI) 22.0-22.9, adult: Secondary | ICD-10-CM | POA: Diagnosis not present

## 2023-02-17 DIAGNOSIS — Z79899 Other long term (current) drug therapy: Secondary | ICD-10-CM | POA: Diagnosis not present

## 2023-02-17 DIAGNOSIS — M542 Cervicalgia: Secondary | ICD-10-CM | POA: Diagnosis not present

## 2023-02-17 DIAGNOSIS — Z09 Encounter for follow-up examination after completed treatment for conditions other than malignant neoplasm: Secondary | ICD-10-CM | POA: Diagnosis not present

## 2023-02-17 DIAGNOSIS — M545 Low back pain, unspecified: Secondary | ICD-10-CM | POA: Diagnosis not present

## 2023-02-22 DIAGNOSIS — Z79899 Other long term (current) drug therapy: Secondary | ICD-10-CM | POA: Diagnosis not present

## 2023-02-25 ENCOUNTER — Encounter: Payer: Self-pay | Admitting: Rehabilitative and Restorative Service Providers"

## 2023-02-25 ENCOUNTER — Ambulatory Visit: Payer: Medicare Other | Attending: Student | Admitting: Rehabilitative and Restorative Service Providers"

## 2023-02-25 DIAGNOSIS — M5416 Radiculopathy, lumbar region: Secondary | ICD-10-CM | POA: Diagnosis not present

## 2023-02-25 DIAGNOSIS — M6281 Muscle weakness (generalized): Secondary | ICD-10-CM | POA: Diagnosis not present

## 2023-02-25 DIAGNOSIS — R29898 Other symptoms and signs involving the musculoskeletal system: Secondary | ICD-10-CM | POA: Insufficient documentation

## 2023-02-25 NOTE — Therapy (Addendum)
OUTPATIENT PHYSICAL THERAPY THORACOLUMBAR TREATMENT NOTE   Patient Name: Anne [name] MRN: [id number] DOB:1956-02-14, 67 y.o., female Today's Date: 03/01/2023  END OF SESSION:   02/25/23 1136  PT Visits / Re-Eval  Visit Number 4  Number of Visits 20  Date for PT Re-Evaluation 05/06/23  Authorization  Authorization Type Medicare A & B BCBS secondary no authrequired  Progress Note Due on Visit 10  PT Time Calculation  PT Start Time 1100  PT Stop Time 1145  PT Time Calculation (min) 45 min  PT - End of Session  Activity Tolerance Patient tolerated treatment well      Past Medical History:  Diagnosis Date   Coronary artery disease    Crohn's colitis (HCC)    Crohn's disease (HCC)    GERD (gastroesophageal reflux disease)    High cholesterol    Hyperlipidemia    Hypertension    Hypokalemia 08/04/2019   STEMI (ST elevation myocardial infarction) (HCC)    10/27/17 PCI/DES x1 to the dRCA, with residual thrombus in the PLA, normal EF   Past Surgical History:  Procedure Laterality Date   ANTERIOR CERVICAL DECOMP/DISCECTOMY FUSION N/A 01/02/2022   Procedure: Anterior Cervical Decompression Fusion - Cervical three-Cervical four - Cervical four-Cervical five - Cervical five-Cervical six;  Surgeon: Tia Alert, MD;  Location: [facility] OR;  Service: Neurosurgery;  Laterality: N/A;   BACK SURGERY  2014   done in [place]   BIOPSY  09/22/2019   Procedure: BIOPSY;  Surgeon: [name], MD;  Location: [facility];  Service: Endoscopy;;   BIOPSY  05/29/2022   Procedure: BIOPSY;  Surgeon: [name], MD;  Location: [facility];  Service: Gastroenterology;;   CHOLECYSTECTOMY  2005   COLONOSCOPY WITH PROPOFOL N/A 09/22/2019   Procedure: COLONOSCOPY WITH PROPOFOL;  Surgeon: [name], MD;  Location: [facility];  Service: Endoscopy;  Laterality: N/A;   COLONOSCOPY WITH PROPOFOL N/A 05/29/2022   Procedure: COLONOSCOPY WITH PROPOFOL;  Surgeon: [name], MD;  Location: [facility]  ENDOSCOPY;  Service: Gastroenterology;  Laterality: N/A;   CORONARY/GRAFT ACUTE MI REVASCULARIZATION N/A 10/26/2017   Procedure: Coronary/Graft Acute MI Revascularization;  Surgeon: Lennette Bihari, MD;  [facility] CV LAB;  Service: Cardiovascular;  Laterality: N/A;   FLEXIBLE SIGMOIDOSCOPY N/A 12/12/2021   Procedure: FLEXIBLE SIGMOIDOSCOPY;  Surgeon: [name], MD;  Location: [facility];  Service: Gastroenterology;  Laterality: N/A;   LEFT HEART CATH AND CORONARY ANGIOGRAPHY N/A 10/26/2017   Procedure: LEFT HEART CATH AND CORONARY ANGIOGRAPHY;  Surgeon: Lennette Bihari, MD;  Location: [facility] INVASIVE CV LAB;  Service: Cardiovascular;  Laterality: N/A;   LEFT HEART CATH AND CORONARY ANGIOGRAPHY Left 05/05/2018   Procedure: Left heart cath and coronary angiography;  Surgeon: Laurier Nancy, MD;  Location: [facility] INVASIVE CV LAB;  Service: Cardiovascular;  Laterality: Left;   LUMBAR LAMINECTOMY/DECOMPRESSION MICRODISCECTOMY Left 06/05/2022   Procedure: LAMINECTOMY AND FORAMINOTOMY - LUMBAR FOUR-LUMBAR FIVE - LUMBAR FIVE-SACRAL ONE - LEFT;  Surgeon: Tia Alert, MD;  Location: [facility] OR;  Service: Neurosurgery;  Laterality: Left;  3C   MUSCLE BIOPSY Left 08/10/2019   Procedure: THIGH MUSCLE BIOPSY;  Surgeon: Abigail Miyamoto, MD;  Location: [facility] OR;  Service: General;  Laterality: Left;   SMALL INTESTINE SURGERY     TONSILLECTOMY     removed as a child   Patient Active Problem List   Diagnosis Date Noted   Mediastinal mass 12/28/2022   Tobacco abuse 12/28/2022   Non-traumatic rhabdomyolysis 12/18/2022   Rhabdomyolysis 12/18/2022  Chronic low blood pressure 11/26/2022   Back pain 11/26/2022   Encounter for smoking cessation counseling 11/26/2022   Health care maintenance 11/26/2022   Stage 3b chronic kidney disease (HCC) 10/09/2022   S/P lumbar laminectomy 06/05/2022   Spinal stenosis of lumbar region with neurogenic claudication 05/22/2022   S/P cervical spinal fusion 01/02/2022    Mixed hyperlipidemia 02/02/2020   Abnormal LFTs 02/02/2020   Acute diarrhea 02/02/2020   Lactic acidosis 02/01/2020   Crohn's disease of colon with complication (HCC) 02/01/2020   Chronic diarrhea 02/01/2020   Coronary artery disease involving native coronary artery of native heart without angina pectoris 02/01/2020   Severe protein-calorie malnutrition (HCC) 08/08/2019   Hypokalemia 08/04/2019   AKI (acute kidney injury) (HCC)    Generalized weakness    Prolonged Q-T interval on ECG    Coronary syndrome, acute (HCC) 04/25/2018   STEMI (ST elevation myocardial infarction) (HCC) 10/26/2017   STEMI involving right coronary artery (HCC) 10/26/2017    PCP: Dr Donzetta Kohut  REFERRING PROVIDER: Sherryl Manges, NP  REFERRING DIAG: radiculopathy lumbar region   Rationale for Evaluation and Treatment: Rehabilitation  THERAPY DIAG:  Radiculopathy, lumbar region - Plan: PT plan of care cert/re-cert  Other symptoms and signs involving the musculoskeletal system - Plan: PT plan of care cert/re-cert  Muscle weakness (generalized) - Plan: PT plan of care cert/re-cert  ONSET DATE: 04/13/21; lumbar surgery 06/05/22  SUBJECTIVE:                                                                                                                                                                                           SUBJECTIVE STATEMENT: Anne [name] reports that she had a cough and cold with a bad cough for over three weeks. She was hospitalized for three days for fluid and meds with good resolution of respiratory symptoms. Her back is good with pain meds but she continues to have weakness in the L leg. She has not felt like doing any of her exercises. Would like to continue therapy to work on strength.   Eval: Patient reports that she has had back pain over the past 7-8 years. Her pain management doctor says she has a disease in her bones. Symptoms increased in the past 2-3 years.  She had an "operation" in the back 06/05/22 with minimal change in the pain in the L LE   PERTINENT HISTORY:  Surgery 06/05/22: LAMINECTOMY AND FORAMINOTOMY - LUMBAR FOUR-LUMBAR FIVE - LUMBAR FIVE-SACRAL ONE - LEFT; spinal fusion not sure of the level 2008; cervical disc surgery 12/2021; CAD; heart stent 2019; Chron's disease  PAIN:  Are you having  pain? Yes: NPRS scale: 3/10; worse standing 8-9/10 Pain location: L LB into L posterior hip/buttock to lateral thigh to ankle  Pain description: nagging pain  Aggravating factors: standing; house work; stairs    Relieving factors: pain meds   PRECAUTIONS: None   WEIGHT BEARING RESTRICTIONS: No  FALLS:  Has patient fallen in last 6 months? Yes. Number of falls 1  R LE gave away when she was stepping off the bus ~ 12/23/22 - no injury  LIVING ENVIRONMENT: Lives with: lives with their family Lives in: House/apartment Stairs: Yes: External: 14 steps; can reach both Has following equipment at home: Single point cane and Walker - 2 wheeled  OCCUPATION: retired Psychologist, educational retired 2010; church, Therapist, music, household chores, cooking   PATIENT GOALS: get L LE stronger   NEXT MD VISIT: none scheduled  OBJECTIVE:   DIAGNOSTIC FINDINGS:  Xray R hip 12/18/22: No acute fracture of the pelvis or right hip. Femoral head is seated in the acetabulum, no dislocation. Probable subchondral cyst in the lateral femoral head. Pubic rami are intact. Pubic symphysis and sacroiliac joints are congruent.  PATIENT SURVEYS:  FOTO 33; goal 48 02/25/23: 52   SENSATION: Tingling L and R LE on an intermittent basis   MUSCLE LENGTH: Hamstrings: Right 70 deg; Left 65 deg Thomas test:tight L   POSTURE: rounded shoulders, forward head, decreased lumbar lordosis, and flexed trunk   PALPATION: Muscular tightness L > R hip flexors; lumbar paraspinals; posterior hip to hamstrings   LUMBAR ROM:   AROM eval  Flexion 65%  Extension 30%  Right lateral  flexion 75% pull L  Left lateral flexion 75%  Right rotation 70%  Left rotation 65%aching B LE's    (Blank rows = not tested)  LOWER EXTREMITY ROM:   end range tightness noted L > R hip   LOWER EXTREMITY MMT:    MMT Right eval Left eval Right  02/25/23 Left  02/25/23  Hip flexion 4+ 4- 4+ 4  Hip extension 3+ 3- 3+ 3-  Hip abduction 4+ 3+ 4+ 4-  Hip adduction      Hip internal rotation      Hip external rotation      Knee flexion      Knee extension 5 4+ 5 4+  Ankle dorsiflexion      Ankle plantarflexion      Ankle inversion      Ankle eversion       (Blank rows = not tested)  LUMBAR SPECIAL TESTS:  Straight leg raise test: Negative tightness in the hamstring on L; Slump test: Negative  FUNCTIONAL TESTS:  5 times sit to stand: 31.03 using bilat UE's to push to get up and lower down  SLS - R 3 sec; L 1 sec   GAIT: Distance walked: 40 ft Assistive device utilized: None Level of assistance: Complete Independence Comments: antalgic gait with decreased wt shift to L LE in stance phase on L    [facility] Adult PT Treatment:                                                DATE: 02/25/23 Therapeutic Exercise: Prone  Glut set 5 sec x 10  Partial press up to pt tolerance 2 sec x 5 Quad stretch with strap 30 sec x 2 L  Supine Piriformis stretch 30 sec  x 2 L  Hamstring stretch w strap hooklying 30 sec x 2 L  Sciatic nerve glide x 10 L/R  Sitting  Sit to stand  Standing  Step up L foot 4 inch step x 10  Lateral step up L 4 inch step x 10  SLS 20 sec x 3 R/L   Therapeutic Activity: Suggested patient begin 5 min 2x/day walking in safe environment   Self Care: Patient reports that she is sitting in firmer chair - not in soft sofa as she was   [facility] Adult PT Treatment:                                                DATE: 01/21/23 Therapeutic Exercise: R sidelying: Manual L hip stretching into adduction blocking pelvis to target L lateral hip Supine: Manual L hip  stretching into adduction (R foot crossed over L leg) to target L lateral hip Standing: L lateral hip stretch (L foot placed behind R, R hand on wall for balance, reaching L hand overhead to wall) L hip drop from 4" step (reaching R foot to floor without bending L knee, R hand on wall for balance) reaching L hand overhead to wall Manual Therapy: R sidelying: Soft tissue mobilization of the L posterolateral hip musculature  [facility] Adult PT Treatment:                                                DATE: 01/14/23 Therapeutic Exercise: R sidelying: Manual L hip stretching into flexion and flexion + external rotation Standing - trialed multiple exercises to get patient to load into L lateral hip including: L hip drop from 4" step (reaching R foot to floor without bending L knee) L hip shift hip hinge  L foot on step, R foot forward, backward L hip shift R foot on step in external rotation, R femoral pull in toward L hip L foot forward L hip shift + contralateral trunk side flexion Manual Therapy: R sidelying: Soft tissue mobilization of the L posterolateral hip musculature Soft tissue mobilization of the L lumbar paraspinals (medial aspect along spinous processes)   PATIENT EDUCATION:  Access Code: [facility] URL: [email or web address] Date: 02/25/2023 Prepared by: Corlis Leak  Program Notes Added (1) L lateral hip stretch (L foot placed behind R, R hand on wall for balance, reaching L hand overhead to wall) (2) L hip drop from 4" step (reaching R foot to floor without bending L knee, R hand on wall for balance) reaching L hand overhead to wall  Exercises - Prone Press Up  - 2 x daily - 7 x weekly - 1 sets - 10 reps - 2-3 sec  hold - Prone Quadriceps Stretch with Strap  - 2 x daily - 7 x weekly - 1 sets - 3 reps - 30 sec  hold - Supine Piriformis Stretch with Leg Straight  - 2 x daily - 7 x weekly - 1 sets - 3 reps - 30 sec  hold - Hooklying Hamstring Stretch with Strap  - 2  x daily - 7 x weekly - 1 sets - 3 reps - 30 sec  hold - Supine Sciatic Nerve Glide  - 2 x daily - 7 x  weekly - 1 sets - 8-10 reps - 1-2 sec  hold - Sit to Stand  - 2 x daily - 7 x weekly - 1 sets - 10 reps - 3-5 sec  hold - Step Up  - 2 x daily - 7 x weekly - 1 sets - 10 reps - 2 sec  hold - Lateral Step Up  - 2 x daily - 7 x weekly - 2 sets - 10 reps - 2 sec  hold - Single Leg Stance with Support  - 2 x daily - 7 x weekly - 1 sets - 3 reps - 20-30 sec  hold  ASSESSMENT:  CLINICAL IMPRESSION: Patient returns after respiratory illness for several weeks requiring hospitalization for 3 days for fluids and medication. She reports that her back pain is improved with medication. She continues to have pain in the L LE posterior hip to ankle area. She demonstrates some increase in strength in the L > R LE but has continued LE weakness. Treatment consisted of stretching and strengthening. Will continue with physical therapy to address deficits.    Eval: Patient is a 67 y.o. female who was seen today for physical therapy evaluation and treatment for L lumbar radiculopathy. Patient has a history of LBP over the course of the past 8 or more years. She has a lumbar fusion (unknown level) 2008; cervical disc surgery 12/2021; lumbar laminectomy and foraminectomy L4/L5/SI 06/05/22. Patient presents with poor posture and alignment; limited trunk and LE mobility and ROM; core and LE weakness; sedentary lifestyle; pain on a daily basis; limited functional activity level. Patient will benefit from PT to address problems identified.    OBJECTIVE IMPAIRMENTS: Abnormal gait, decreased activity tolerance, decreased balance, decreased endurance, decreased mobility, decreased ROM, decreased strength, increased fascial restrictions, increased muscle spasms, impaired flexibility, improper body mechanics, postural dysfunction, and pain.    GOALS: Goals reviewed with patient? Yes  SHORT TERM GOALS: Target date:  02/18/2023   Independent in initial HEP  Baseline: Goal status: on going   2.  Patient demonstrates and report improve sitting and standing postures/positions  Baseline:  Goal status: on going     LONG TERM GOALS: Target date: 05/06/2023    Decrease pain 50-70% allowing patient to increase activity level Baseline:  Goal status: on going   2.  Increase strength bilat LE's to 4/5 to 5/5  Baseline:  Goal status: on going   3.  Increase core strength and stability allowing patient to increase activity level tolerating 20-30 min of exercise without change in pain level  Baseline:  Goal status: on going   4.  Patient to demonstrate proper lifting techniques for functional lifts  Baseline:  Goal status: on going   5.  Independent in HEP including aquatic program as indicated  Baseline:  Goal status: on going   6.  Improve functional limitation score to 48  Baseline: 52 Goal status: met   PLAN:  PT FREQUENCY: 2x/week  PT DURATION: 12 weeks  PLANNED INTERVENTIONS: Therapeutic exercises, Therapeutic activity, Neuromuscular re-education, Balance training, Gait training, Patient/Family education, Self Care, Joint mobilization, Aquatic Therapy, Dry Needling, Electrical stimulation, Cryotherapy, Moist heat, Ultrasound, Ionotophoresis 4mg /ml Dexamethasone, Manual therapy, and Re-evaluation.  PLAN FOR NEXT SESSION: review and progress exercises; continue with back care education and correction; manual work, DN, modalities as indicated - trial of TENS for pain management; schedule aquatic therapy (patient has participated in water exercise classes in the past)     Val Riles, PT 03/01/2023, 7:20  AM

## 2023-02-26 ENCOUNTER — Telehealth: Payer: Self-pay

## 2023-02-26 ENCOUNTER — Ambulatory Visit: Payer: Medicare Other | Admitting: Student

## 2023-02-26 ENCOUNTER — Other Ambulatory Visit: Payer: Self-pay | Admitting: *Deleted

## 2023-02-26 ENCOUNTER — Encounter: Payer: Self-pay | Admitting: Student

## 2023-02-26 VITALS — BP 93/66 | HR 79 | Wt 156.9 lb

## 2023-02-26 DIAGNOSIS — E782 Mixed hyperlipidemia: Secondary | ICD-10-CM

## 2023-02-26 DIAGNOSIS — I9589 Other hypotension: Secondary | ICD-10-CM | POA: Diagnosis not present

## 2023-02-26 DIAGNOSIS — J9859 Other diseases of mediastinum, not elsewhere classified: Secondary | ICD-10-CM | POA: Diagnosis not present

## 2023-02-26 DIAGNOSIS — Z79899 Other long term (current) drug therapy: Secondary | ICD-10-CM

## 2023-02-26 DIAGNOSIS — G8929 Other chronic pain: Secondary | ICD-10-CM | POA: Diagnosis not present

## 2023-02-26 DIAGNOSIS — I2510 Atherosclerotic heart disease of native coronary artery without angina pectoris: Secondary | ICD-10-CM

## 2023-02-26 DIAGNOSIS — E876 Hypokalemia: Secondary | ICD-10-CM | POA: Diagnosis not present

## 2023-02-26 DIAGNOSIS — K50119 Crohn's disease of large intestine with unspecified complications: Secondary | ICD-10-CM | POA: Diagnosis not present

## 2023-02-26 DIAGNOSIS — Z23 Encounter for immunization: Secondary | ICD-10-CM

## 2023-02-26 DIAGNOSIS — M5442 Lumbago with sciatica, left side: Secondary | ICD-10-CM | POA: Diagnosis not present

## 2023-02-26 DIAGNOSIS — F17210 Nicotine dependence, cigarettes, uncomplicated: Secondary | ICD-10-CM | POA: Diagnosis not present

## 2023-02-26 MED ORDER — NICOTINE POLACRILEX 4 MG MT GUM: 4.0000 mg | CHEWING_GUM | OROMUCOSAL | 0 refills | Status: DC | PRN

## 2023-02-26 MED ORDER — CYCLOBENZAPRINE HCL 5 MG PO TABS: 5.0000 mg | ORAL_TABLET | Freq: Every day | ORAL | 2 refills | Status: DC

## 2023-02-26 NOTE — Telephone Encounter (Signed)
Dr Carmie End sent in a rx refill for pt ( NICTINE 4 MG  )  the pharmacy sent back a request to specific directions... I gave said request to the Dr .. She  signed the RX request form and place the directions as follows  :    Chew  1 gum ( 4MG )  every 1-2 hours  to control  cravings , can begin 8-12 pieces  daily , do not  use more than  24 pieces of gum per day ...        ( RX REQUEST  WAS FAXED BACK TO PHARMACY  AND PLACED TO SCAN TO CHART  ALSO )

## 2023-02-26 NOTE — Progress Notes (Incomplete)
   Established Patient Office Visit  Subjective   Patient ID: Anne [name], female    DOB: 01/01/1956  Age: 67 y.o. MRN: [id number]  No chief complaint on file.   HPI  {History (Optional):23778}  ROS    Objective:     LMP 02/18/2001  {Vitals History (Optional):23777}  Physical Exam   No results found for any visits on 02/26/23.  {Labs (Optional):23779}  The ASCVD Risk score (Arnett DK, et al., 2019) failed to calculate for the following reasons:   The patient has a prior MI or stroke diagnosis    Assessment & Plan:   Problem List Items Addressed This Visit   None   No follow-ups on file.    Anne Lacson Colbert Coyer, MD

## 2023-02-26 NOTE — Patient Instructions (Signed)
Thank you, Ms.Aniceto Boss for allowing [place] to provide your care today. Today we discussed your recent hospitalization for chronic bronchitis, your tobacco smoking, pain medications, cholesterol medications, and follow up.   I have ordered the following labs for you:  Complete Metabolic Panel ([facility]) Lipid panel ([facility])  Tests ordered today:  None  Referrals ordered today:   Referral Orders  No referral(s) requested today     I have ordered the following medication/changed the following medications:   Stop the following medications: Medications Discontinued During This Encounter  Medication Reason   cholestyramine (QUESTRAN) 4 g packet Patient has not taken in last 30 days   nicotine polacrilex (NICORETTE) 4 MG gum Reorder     Start the following medications: Meds ordered this encounter  Medications   cyclobenzaprine (FLEXERIL) 5 MG tablet    Sig: Take 1 tablet (5 mg total) by mouth at bedtime.    Dispense:  30 tablet    Refill:  2   nicotine polacrilex (NICORETTE) 4 MG gum    Sig: Take 1 each (4 mg total) by mouth as needed for smoking cessation.    Dispense:  100 tablet    Refill:  0     Follow up: 3 months   Remember:   - Please make sure you get your CT scan completed before 03/30/23. This is for the mediastinal mass they found on imaging during your September hospitalization.   - I will call you with the results of your labs to help determine your current kidney function, potassium, and cholesterol.   - Your cyclobenzaprine 10 mg nightly was reduced to 5 mg nightly.   - Please pick up your nicotine gum, we are glad you are reducing your cigarette smoking.   - You will get a call from pharmacy to help determine what other medications you are taking since you are following with other doctors that may not be in our system.   Should you have any questions or concerns please call the internal medicine clinic at [phone].     Aidric Endicott Colbert Coyer,  MD PGY-1 Internal Medicine Teaching [facility]

## 2023-02-27 NOTE — Assessment & Plan Note (Signed)
Patient's NP today 93/66, HR 79. Asymptomatic, states she feels well. Blood pressure chronically low. Not currently on antihypertensives, does take midodrine 5 mg as needed is she has a low blood pressure and feels any symptoms such as lightheadedness, weakness, or syncope. Discussed importance of staying well hydrated and resting if she ever feels symptomatic. Patient acknowledged understanding.  - Continue midodrine 5 mg as needed

## 2023-02-27 NOTE — Assessment & Plan Note (Signed)
Patient seen by her GI doctor, Dr. Elnoria Howard on 9/25. At that time they discussed transitioning from infliximab infusions every 2 months to Humira injections. Patient and her son expressed some hesitation to the use of biologic therapy but stated they were willing to try, in the process of insurance authorization. Patient with no concerns today.  - Patient will continue to follow with Dr. Elnoria Howard, upcoming appointment 12/23

## 2023-02-27 NOTE — Assessment & Plan Note (Addendum)
Patient's acetylcholine receptor antibody test negative. Seemed to be unaware of need to repeat CT chest with contrast to evaluate for incidental left anterior mediastinal mass noted on imaging. Recent thyroid testing performed during recent hospitalization. No concerns for mass on physical exam today. Patient and son acknowledged understanding regarding need for follow up with imaging.  - CT chest order in, patient to follow up before December 17

## 2023-02-27 NOTE — Assessment & Plan Note (Deleted)
Patient with lower back pain that radiates to buttocks and legs. Describes it as a shooting pain, Also experiencing a deeper pain around the cervical area, reproducible with palpation. Patient has been taking gabapentin 400 mg TID and flexeril 10 mg nightly to help with symptoms. Of note, medications not updated in chart. Discussed reducing dose of flexeril given drowsy effect and risk of confusion and falls. Patient agreeable.  - Continue gabapentin 400 mg TID - Discontinue flexeril 10 mg nightly, START flexeril 5 mg nightly  - Referral to pharmacy management for help with polypharmacy, especially because medications are not updated in record and she is seeing physicians in various health systems

## 2023-02-27 NOTE — Assessment & Plan Note (Addendum)
Patient with potassium 4.3 on 9/11, 3.6 on 11/03. Has been taking OTC potassium. Denies any palpitations, muscle weakness, or increased urination. Discussed importance of getting a CMP to assess electrolyte levels, patient agreeable.  - Follow up on CMP (patient to get at [facility]) as needed, for now can continue to take OTC potassium

## 2023-02-27 NOTE — Assessment & Plan Note (Addendum)
Patient wondering if she should continue to take her atorvastatin 20 mg as well as Zetia 10 mg tablet. She states she stopped taking atorvastatin 20 mg and has only been taking her Zetia. Denies any dysuria or hematuria today. Endorses chronic low back pain and cervical muscle pain. Discussed repeating lipid panel today to help assess LDL to help determine medication management, patient agreeable. Last LDL level 42 from 1 year ago. For now, will continue with Zetia 10 mg only. - Get lipid panel, patient to get at [facility] lab (cheaper), if LDL continues at goal (<100) can consider staying on Zetia only given history of rhabdomyolysis and myalgias with statin; if high above her goal can consider changing to a different statin

## 2023-02-27 NOTE — Assessment & Plan Note (Addendum)
Patient with lower back pain that radiates to buttocks and legs. Describes it as a shooting pain, Also experiencing a deeper pain around the cervical area, reproducible with palpation. Patient has been taking gabapentin 400 mg TID and flexeril 10 mg nightly to help with symptoms. Patient also taking oxycodone PRN (managed by Dr. Anders Grant management clinic). Of note, medications not updated in chart. Discussed reducing dose of flexeril given drowsy effect and risk of confusion and falls. Patient agreeable.  - Continue gabapentin 400 mg TID - Discontinue flexeril 10 mg nightly, START flexeril 5 mg nightly  - Continue following with pain management clinic - Referral to pharmacy management for help with polypharmacy, especially because medications are not updated in record and she is seeing physicians in various health systems

## 2023-02-27 NOTE — Assessment & Plan Note (Signed)
>>  ASSESSMENT AND PLAN FOR HYPOKALEMIA WRITTEN ON 02/27/2023  7:50 PM BY ARELLANO ZAMEZA, PRISCILA, MD  Patient with potassium 4.3 on 9/11, 3.6 on 11/03. Has been taking OTC potassium. Denies any palpitations, muscle weakness, or increased urination. Discussed importance of getting a CMP to assess electrolyte levels, patient agreeable.  - Follow up on CMP (patient to get at [facility]) as needed, for now can continue to take OTC potassium

## 2023-03-01 ENCOUNTER — Telehealth: Payer: Self-pay

## 2023-03-01 DIAGNOSIS — E782 Mixed hyperlipidemia: Secondary | ICD-10-CM | POA: Diagnosis not present

## 2023-03-01 DIAGNOSIS — E876 Hypokalemia: Secondary | ICD-10-CM | POA: Diagnosis not present

## 2023-03-01 NOTE — Progress Notes (Signed)
   Care Guide Note  03/01/2023 Name: RAYNAH GOLDBERG MRN: [id number] DOB: 1955/11/14  Referred by: Philomena Doheny, MD Reason for referral : Care Coordination (Outreach to schedule with Pharm d )   SILVANA MATHERN is a 67 y.o. year old female who is a primary care patient of Colbert Coyer, Alyson Locket, MD. Aniceto Boss was referred to the pharmacist for assistance related to HLD.    Successful contact was made with the patient to discuss pharmacy services including being ready for the pharmacist to call at least 5 minutes before the scheduled appointment time, to have medication bottles and any blood sugar or blood pressure readings ready for review. The patient agreed to meet with the pharmacist via with the pharmacist via telephone visit on (date/time).  03/17/2023  Penne Lash , RMA     [place]  [facility], [facility] Guide  Direct Dial: [phone]  Website: [email or web address]

## 2023-03-01 NOTE — Progress Notes (Signed)
Internal Medicine Clinic Attending  I was physically present during the key portions of the resident provided service and participated in the medical decision making of patient's management care. I reviewed pertinent patient test results.  The assessment, diagnosis, and plan were formulated together and I agree with the documentation in the resident's note.  Mercie Eon, MD    Since she has CAD, my LDL goal for her is <70 for secondary prevention. Since she has now had 2 episodes of rhabdo without other known causes, I think we do need to stop her Atorvastatin. We will see what her LDL is since she's been taking Zetia and not statin. If not at goal, I think we should refer her to the lipid clinic for consideration of PCSK9 inhibitor

## 2023-03-02 LAB — LIPID PANEL
Cholesterol: 145 mg/dL (ref <200)
HDL: 55 mg/dL (ref >=50)
LDL Cholesterol (Calc): 73 mg/dL
Non-HDL Cholesterol (Calc): 90 mg/dL (ref <130)
Total CHOL/HDL Ratio: 2.6 (calc) (ref <5.0)
Triglycerides: 85 mg/dL (ref <150)

## 2023-03-02 LAB — COMPREHENSIVE METABOLIC PANEL
AG Ratio: 1 (calc) (ref 1.0–2.5)
ALT: 32 U/L — ABNORMAL HIGH (ref 6–29)
AST: 19 U/L (ref 10–35)
Albumin: 3.2 g/dL — ABNORMAL LOW (ref 3.6–5.1)
Alkaline phosphatase (APISO): 97 U/L (ref 37–153)
BUN: 13 mg/dL (ref 7–25)
CO2: 27 mmol/L (ref 20–32)
Calcium: 8.3 mg/dL — ABNORMAL LOW (ref 8.6–10.4)
Chloride: 105 mmol/L (ref 98–110)
Creat: 1.02 mg/dL (ref 0.50–1.05)
Globulin: 3.1 g/dL (ref 1.9–3.7)
Glucose, Bld: 83 mg/dL (ref 65–99)
Potassium: 4.1 mmol/L (ref 3.5–5.3)
Sodium: 141 mmol/L (ref 135–146)
Total Bilirubin: 0.5 mg/dL (ref 0.2–1.2)
Total Protein: 6.3 g/dL (ref 6.1–8.1)

## 2023-03-04 DIAGNOSIS — M5416 Radiculopathy, lumbar region: Secondary | ICD-10-CM | POA: Diagnosis not present

## 2023-03-05 DIAGNOSIS — H25812 Combined forms of age-related cataract, left eye: Secondary | ICD-10-CM | POA: Diagnosis not present

## 2023-03-15 ENCOUNTER — Telehealth: Payer: Self-pay | Admitting: *Deleted

## 2023-03-15 NOTE — Telephone Encounter (Signed)
Pt stated her doctor told her she will be having a scan of her thyroid. And she was given an appt for CT Chest. I informed pt per 11/15 OV note, CT chest was ordered for her thymus (mediastinal mass) not thyroid; it is scheduled 12/17.

## 2023-03-17 ENCOUNTER — Other Ambulatory Visit (HOSPITAL_COMMUNITY): Payer: Self-pay

## 2023-03-17 ENCOUNTER — Other Ambulatory Visit: Payer: Self-pay | Admitting: Pharmacist

## 2023-03-17 DIAGNOSIS — Z716 Tobacco abuse counseling: Secondary | ICD-10-CM

## 2023-03-17 DIAGNOSIS — M48062 Spinal stenosis, lumbar region with neurogenic claudication: Secondary | ICD-10-CM

## 2023-03-17 MED ORDER — NICOTINE POLACRILEX 2 MG MT LOZG
2.0000 mg | LOZENGE | OROMUCOSAL | 0 refills | Status: DC | PRN
  Filled 2023-03-17: qty 72, 8d supply, fill #0

## 2023-03-17 NOTE — Progress Notes (Unsigned)
03/17/2023 Name: Anne [name] MRN: [id number] DOB: Aug 01, 1955  Chief Complaint  Patient presents with   Medication Management   Nicotine Dependence    Anne [name] is a 67 y.o. year old female who presented for a telephone visit.   They were referred to the pharmacist by their PCP for assistance in managing complex medication management.    Subjective:  Care Team: Primary Care Provider: Colbert Coyer, Alyson Locket, MD ; Next Scheduled Visit: 05/27/23 Clinical Pharmacist: Marlowe Aschoff, PharmD  Medication Access/Adherence  Current Pharmacy:  [facility] [id number] [place], [place] - [address] AT [facility] [address] [facility] [address] [place] [address] Phone: [phone] Fax: [phone]  [facility] [address] [place] [address] Phone: [phone] Fax: [phone]   Patient reports affordability concerns with their medications: Yes - Nicorette gum Patient reports access/transportation concerns to their pharmacy: No  Patient reports adherence concerns with their medications:  No     Tobacco Abuse:  Tobacco Use History: Number of cigarettes per day 6 Smokes first cigarette around noon each day- waits several hours Does not wake at night to smoke  Quit Attempt History: Tried: Patches while in the hospital (no side effects)  Current medication access support: Traditional Medicare + [employer] ([employer]?)  Medication Management: - Reports good adherence to medications, but does NOT notify PCP when refills have run out  Objective:  Lab Results  Component Value Date   HGBA1C 5.6 10/27/2017    Lab Results  Component Value Date   CREATININE 1.02 03/01/2023   BUN 13 03/01/2023   NA 141 03/01/2023   K 4.1 03/01/2023   CL 105 03/01/2023   CO2 27 03/01/2023    Lab Results  Component Value Date   CHOL 145 03/01/2023   HDL 55 03/01/2023   LDLCALC 73 03/01/2023   TRIG 85  03/01/2023   CHOLHDL 2.6 03/01/2023    Medications Reviewed Today     Reviewed by Pollie Friar, RPH (Pharmacist) on 03/17/23 at 1525  Med List Status: <None>   Medication Order Taking? Sig Documenting Provider Last Dose Status Informant  adalimumab (HUMIRA, 2 PEN,) 80 MG/0.8ML pen [id number] Yes Inject 160 mg into the skin once. [provider] Taking Active            Med Note Lorenso Courier, Marjory Lies   Wed Mar 17, 2023  3:25 PM) Will switch to 40mg  inj every other week at day 30   aspirin EC 81 MG tablet [id number] Yes Take 1 tablet (81 mg total) by mouth daily. Swallow whole. Lewayne Bunting, MD Taking Active Self, Pharmacy Records           Med Note Daphine Deutscher, [facility]   Fri May 22, 2022  1:57 PM)    cyclobenzaprine (FLEXERIL) 5 MG tablet [id number] Yes Take 1 tablet (5 mg total) by mouth at bedtime. Philomena Doheny, MD Taking Active   ezetimibe (ZETIA) 10 MG tablet [id number] Yes TAKE 1 TABLET(10 MG) BY MOUTH AT BEDTIME Sherron Monday, MD Taking Active Pharmacy Records  gabapentin (NEURONTIN) 400 MG capsule [id number] Yes Take 1 capsule (400 mg total) by mouth 3 (three) times daily. Sherron Monday, MD Taking Active Pharmacy Records  Multiple Vitamins-Minerals (CENTRUM MINIS WOMEN 50+ PO) [id number] Yes Take 1 tablet by mouth daily. [provider] Taking Active   nicotine polacrilex (NICORETTE) 4 MG gum [id number] No Take 1 each (4 mg total)  by mouth as needed for smoking cessation.  Patient not taking: Reported on 03/17/2023   Philomena Doheny, MD Not Taking Active   nitroGLYCERIN (NITROSTAT) 0.4 MG SL tablet [id number] Yes Place 1 tablet (0.4 mg total) under the tongue every 5 (five) minutes x 3 doses as needed for chest pain. Arty Baumgartner, NP Taking Active Self, Pharmacy Records           Med Note (COX, CANDICE A   Wed Jul 01, 2022  4:14 PM)    oxyCODONE-acetaminophen (PERCOCET) 10-325 MG tablet [id number] Yes Take 1 tablet by mouth every 6 (six) hours  as needed for pain. [provider] Taking Active            Med Note Lorenso Courier, Marjory Lies   Wed Mar 17, 2023  3:15 PM) 2 in AM + 2 in PM  Potassium Gluconate 550 (90 K) MG TABS [id number] Yes Take 1 tablet by mouth daily. OTC [provider] Taking Active   rosuvastatin (CRESTOR) 20 MG tablet [id number] Yes Take 1 tablet (20 mg total) by mouth daily. Chauncey Mann, DO Taking Active   sodium bicarbonate 650 MG tablet [id number] Yes TAKE 3 TABLETS(1950 MG) BY MOUTH TWICE DAILY Tejan-Sie, S Ahmed, MD Taking Active Pharmacy Records            Assessment/Plan:   Tobacco Abuse - Currently uncontrolled - Provided motivational interviewing to assess tobacco use and strategies for reduction - Start nicotine lozenge 2 mg as needed. Counseled on proper use and potential side effects, including nausea, hiccups, cough, and heartburn. Advised to minimize the amount of nicotine swallowed to reduce incidence of GI side effects - Goal quit date of 04/14/23 per patient  - Currently at 6 cigarettes daily  - Aim for 3 cigarettes by 03/28/23 and 1 cigarette by 04/05/23 - Sent nicotine lozenge Rx to [facility] pharmacy as this is cheapest option at $30.85 for 72 count (vs $41 at [facility] for 100 ct of gum)  Medication Management: - Currently strategy sufficient to maintain appropriate adherence to prescribed medication regimen - Suggested use of weekly pill box to organize medications - Patient is getting each medication from [facility] currently and Humira mailed from a specialty pharmacy (should be getting over the next week to start- approved by PAP) - Needs refills for Gabapentin and Sodium bicarbonate from new PCP - Also needs refill for Rosuvastatin- has not been taking for 2 months now    Follow Up Plan:  - Follow-up call in 1 month to ensure all medications have been refilled and smoking cessation status - Sent Nicotine lozenge 2mg  Rx to [facility] to be filled- task set for Friday to ensure  picked up - Ask PCP about getting medication refills - [facility] and they are working on filling 90DS of Rosuvastatin now   Update: - Called patient at 4:30PM on 03/19/23 after speaking with the pharmacy- reports a 72 count of lozenges is currently $36 at their pharmacy - Told her [facility] is showing 81 count for $25 currently- said she will go there and ask the pharmacy for assistance in locating the 2mg  lozenges - [facility] also has 108 ct for $32 currently  Marlowe Aschoff, PharmD [facility] Phone Number: [phone]

## 2023-03-18 DIAGNOSIS — M545 Low back pain, unspecified: Secondary | ICD-10-CM | POA: Diagnosis not present

## 2023-03-18 DIAGNOSIS — Z79899 Other long term (current) drug therapy: Secondary | ICD-10-CM | POA: Diagnosis not present

## 2023-03-18 DIAGNOSIS — M542 Cervicalgia: Secondary | ICD-10-CM | POA: Diagnosis not present

## 2023-03-19 ENCOUNTER — Other Ambulatory Visit (HOSPITAL_COMMUNITY): Payer: Self-pay

## 2023-03-25 ENCOUNTER — Other Ambulatory Visit: Payer: Self-pay

## 2023-03-25 ENCOUNTER — Ambulatory Visit: Payer: Medicare Other | Attending: Student

## 2023-03-25 DIAGNOSIS — R29898 Other symptoms and signs involving the musculoskeletal system: Secondary | ICD-10-CM | POA: Insufficient documentation

## 2023-03-25 DIAGNOSIS — M6281 Muscle weakness (generalized): Secondary | ICD-10-CM | POA: Insufficient documentation

## 2023-03-25 DIAGNOSIS — M5416 Radiculopathy, lumbar region: Secondary | ICD-10-CM | POA: Insufficient documentation

## 2023-03-25 MED ORDER — SODIUM BICARBONATE 650 MG PO TABS: 1950.0000 mg | ORAL_TABLET | Freq: Two times a day (BID) | ORAL | 5 refills | Status: DC

## 2023-03-25 MED ORDER — GABAPENTIN 400 MG PO CAPS: 400.0000 mg | ORAL_CAPSULE | Freq: Three times a day (TID) | ORAL | 1 refills | Status: DC

## 2023-03-25 NOTE — Therapy (Addendum)
 OUTPATIENT PHYSICAL THERAPY THORACOLUMBAR TREATMENT NOTE PHYSICAL THERAPY DISCHARGE SUMMARY  Visits from Start of Care: 5  Current functional level related to goals / functional outcomes: See progress note for discharge status    Remaining deficits: Unknown   Education / Equipment: HEP    Patient agrees to discharge. Patient goals were not met. Patient is being discharged due to not returning since the last visit.  Celyn P. Leonor Liv PT, MPH 06/21/23 8:04 AM     Patient Name: Anne [name] MRN: [id number] DOB:Aug 25, 1955, 67 y.o., female Today's Date: 03/25/2023  END OF SESSION:  PT End of Session - 03/25/23 0927     Visit Number 5    Number of Visits 20    Date for PT Re-Evaluation 05/06/23    Authorization Type Medicare A & B BCBS secondary no authrequired    Progress Note Due on Visit 10    PT Start Time 0930    PT Stop Time 1009    PT Time Calculation (min) 39 min    Activity Tolerance Patient tolerated treatment well    Behavior During Therapy WFL for tasks assessed/performed            Past Medical History:  Diagnosis Date   Coronary artery disease    Crohn's colitis (HCC)    Crohn's disease (HCC)    GERD (gastroesophageal reflux disease)    High cholesterol    Hyperlipidemia    Hypertension    Hypokalemia 08/04/2019   STEMI (ST elevation myocardial infarction) (HCC)    10/27/17 PCI/DES x1 to the dRCA, with residual thrombus in the PLA, normal EF   Past Surgical History:  Procedure Laterality Date   ANTERIOR CERVICAL DECOMP/DISCECTOMY FUSION N/A 01/02/2022   Procedure: Anterior Cervical Decompression Fusion - Cervical three-Cervical four - Cervical four-Cervical five - Cervical five-Cervical six;  Surgeon: Tia Alert, MD;  Location: [facility] OR;  Service: Neurosurgery;  Laterality: N/A;   BACK SURGERY  2014   done in [place]   BIOPSY  09/22/2019   Procedure: BIOPSY;  Surgeon: [name], MD;  Location: [facility];  Service: Endoscopy;;   BIOPSY   05/29/2022   Procedure: BIOPSY;  Surgeon: [name], MD;  Location: [facility];  Service: Gastroenterology;;   CHOLECYSTECTOMY  2005   COLONOSCOPY WITH PROPOFOL N/A 09/22/2019   Procedure: COLONOSCOPY WITH PROPOFOL;  Surgeon: [name], MD;  Location: [facility];  Service: Endoscopy;  Laterality: N/A;   COLONOSCOPY WITH PROPOFOL N/A 05/29/2022   Procedure: COLONOSCOPY WITH PROPOFOL;  Surgeon: [name], MD;  Location: [facility];  Service: Gastroenterology;  Laterality: N/A;   CORONARY/GRAFT ACUTE MI REVASCULARIZATION N/A 10/26/2017   Procedure: Coronary/Graft Acute MI Revascularization;  Surgeon: Lennette Bihari, MD;  [facility] INVAS[facility] CV LAB;  Service: Cardiovascular;  Laterality: N/A;   FLEXIBLE SIGMOIDOSCOPY N/A 12/12/2021   Procedure: FLEXIBLE SIGMOIDOSCOPY;  Surgeon: [name], MD;  Location: [facility];  Service: Gastroenterology;  Laterality: N/A;   LEFT HEART CATH AND CORONARY ANGIOGRAPHY N/A 10/26/2017   Procedure: LEFT HEART CATH AND CORONARY ANGIOGRAPHY;  Surgeon: Lennette Bihari, MD;  Location: [facility] INVASIVE CV LAB;  Service: Cardiovascular;  Laterality: N/A;   LEFT HEART CATH AND CORONARY ANGIOGRAPHY Left 05/05/2018   Procedure: Left heart cath and coronary angiography;  Surgeon: Laurier Nancy, MD;  Location: [facility] INVASIVE CV LAB;  Service: Cardiovascular;  Laterality: Left;   LUMBAR LAMINECTOMY/DECOMPRESSION MICRODISCECTOMY Left 06/05/2022   Procedure: LAMINECTOMY AND FORAMINOTOMY - LUMBAR FOUR-LUMBAR FIVE - LUMBAR FIVE-SACRAL ONE -  LEFT;  Surgeon: Tia Alert, MD;  Location: [facility] OR;  Service: Neurosurgery;  Laterality: Left;  3C   MUSCLE BIOPSY Left 08/10/2019   Procedure: THIGH MUSCLE BIOPSY;  Surgeon: Abigail Miyamoto, MD;  Location: [facility] OR;  Service: General;  Laterality: Left;   SMALL INTESTINE SURGERY     TONSILLECTOMY     removed as a child   Patient Active Problem List   Diagnosis Date Noted   Mediastinal mass 12/28/2022   Tobacco abuse  12/28/2022   Non-traumatic rhabdomyolysis 12/18/2022   Rhabdomyolysis 12/18/2022   Chronic low blood pressure 11/26/2022   Back pain 11/26/2022   Encounter for smoking cessation counseling 11/26/2022   Health care maintenance 11/26/2022   Stage 3b chronic kidney disease (HCC) 10/09/2022   S/P lumbar laminectomy 06/05/2022   Spinal stenosis of lumbar region with neurogenic claudication 05/22/2022   S/P cervical spinal fusion 01/02/2022   Mixed hyperlipidemia 02/02/2020   Abnormal LFTs 02/02/2020   Acute diarrhea 02/02/2020   Lactic acidosis 02/01/2020   Crohn's disease of colon with complication (HCC) 02/01/2020   Chronic diarrhea 02/01/2020   Coronary artery disease involving native coronary artery of native heart without angina pectoris 02/01/2020   Severe protein-calorie malnutrition (HCC) 08/08/2019   Hypokalemia 08/04/2019   AKI (acute kidney injury) (HCC)    Generalized weakness    Prolonged Q-T interval on ECG    Coronary syndrome, acute (HCC) 04/25/2018   STEMI (ST elevation myocardial infarction) (HCC) 10/26/2017   STEMI involving right coronary artery (HCC) 10/26/2017    PCP: Dr Donzetta Kohut  REFERRING PROVIDER: Sherryl Manges, NP  REFERRING DIAG: radiculopathy lumbar region   Rationale for Evaluation and Treatment: Rehabilitation  THERAPY DIAG:  Radiculopathy, lumbar region  Other symptoms and signs involving the musculoskeletal system  Muscle weakness (generalized)  ONSET DATE: 04/13/21; lumbar surgery 06/05/22  SUBJECTIVE:                                                                                                                                                                                           SUBJECTIVE STATEMENT: Patient reports her low back aches bilaterally, states she is not getting relief with pain medication or ice/heat. Patient states she has a chest CT scan and back MRI scheduled tomorrow and cataract surgery on  04/04/23.  Eval: Patient reports that she has had back pain over the past 7-8 years. Her pain management doctor says she has a disease in her bones. Symptoms increased in the past 2-3 years. She had an "operation" in the back 06/05/22 with minimal change in the pain in the L LE   PERTINENT  HISTORY:  Surgery 06/05/22: LAMINECTOMY AND FORAMINOTOMY - LUMBAR FOUR-LUMBAR FIVE - LUMBAR FIVE-SACRAL ONE - LEFT; spinal fusion not sure of the level 2008; cervical disc surgery 12/2021; CAD; heart stent 2019; Chron's disease  PAIN:  Are you having pain? Yes: NPRS scale: 3/10; worse standing 8-9/10 Pain location: L LB into L posterior hip/buttock to lateral thigh to ankle  Pain description: nagging pain  Aggravating factors: standing; house work; stairs    Relieving factors: pain meds   PRECAUTIONS: None   WEIGHT BEARING RESTRICTIONS: No  FALLS:  Has patient fallen in last 6 months? Yes. Number of falls 1  R LE gave away when she was stepping off the bus ~ 12/23/22 - no injury  LIVING ENVIRONMENT: Lives with: lives with their family Lives in: House/apartment Stairs: Yes: External: 14 steps; can reach both Has following equipment at home: Single point cane and Walker - 2 wheeled  OCCUPATION: retired Psychologist, educational retired 2010; church, Therapist, music, household chores, cooking   PATIENT GOALS: get L LE stronger   NEXT MD VISIT: none scheduled  OBJECTIVE:   DIAGNOSTIC FINDINGS:  Xray R hip 12/18/22: No acute fracture of the pelvis or right hip. Femoral head is seated in the acetabulum, no dislocation. Probable subchondral cyst in the lateral femoral head. Pubic rami are intact. Pubic symphysis and sacroiliac joints are congruent.  PATIENT SURVEYS:  FOTO 33; goal 48 02/25/23: 52   SENSATION: Tingling L and R LE on an intermittent basis   MUSCLE LENGTH: Hamstrings: Right 70 deg; Left 65 deg Thomas test:tight L   POSTURE: rounded shoulders, forward head, decreased lumbar lordosis, and  flexed trunk   PALPATION: Muscular tightness L > R hip flexors; lumbar paraspinals; posterior hip to hamstrings   LUMBAR ROM:   AROM eval  Flexion 65%  Extension 30%  Right lateral flexion 75% pull L  Left lateral flexion 75%  Right rotation 70%  Left rotation 65%aching B LE's    (Blank rows = not tested)  LOWER EXTREMITY ROM:   end range tightness noted L > R hip   LOWER EXTREMITY MMT:    MMT Right eval Left eval Right  02/25/23 Left  02/25/23  Hip flexion 4+ 4- 4+ 4  Hip extension 3+ 3- 3+ 3-  Hip abduction 4+ 3+ 4+ 4-  Hip adduction      Hip internal rotation      Hip external rotation      Knee flexion      Knee extension 5 4+ 5 4+  Ankle dorsiflexion      Ankle plantarflexion      Ankle inversion      Ankle eversion       (Blank rows = not tested)  LUMBAR SPECIAL TESTS:  Straight leg raise test: Negative tightness in the hamstring on L; Slump test: Negative  FUNCTIONAL TESTS:  5 times sit to stand: 31.03 using bilat UE's to push to get up and lower down  SLS - R 3 sec; L 1 sec   GAIT: Distance walked: 40 ft Assistive device utilized: None Level of assistance: Complete Independence Comments: antalgic gait with decreased wt shift to L LE in stance phase on L    [facility] Adult PT Treatment:  DATE: 03/25/2023 Therapeutic Exercise: Supine: LTR Sciatic nerve glides x10 (B) SLR x10 (B) HS streth w/strap 3x30" (B) Bridges 2x10 Prone press up (pt did not tolerate well) Seated hip flexor stretch (table corner) 3x30" each leg Standing: L SLS hip hikes --> standing on 4" step, R tap down on 2" block Doorway QL stretch (L) --> staggered stance x30", L crossed behind x30" Staggered stance STS (L back) x10 Standing marching with TA stabilization Seated red SB roll out stretch  front & diagonals    [facility] Adult PT Treatment:                                                DATE: 02/25/23 Therapeutic Exercise: Prone   Glut set 5 sec x 10  Partial press up to pt tolerance 2 sec x 5 Quad stretch with strap 30 sec x 2 L  Supine Piriformis stretch 30 sec x 2 L  Hamstring stretch w strap hooklying 30 sec x 2 L  Sciatic nerve glide x 10 L/R  Sitting  Sit to stand  Standing  Step up L foot 4 inch step x 10  Lateral step up L 4 inch step x 10  SLS 20 sec x 3 R/L  Therapeutic Activity: Suggested patient begin 5 min 2x/day walking in safe environment  Self Care: Patient reports that she is sitting in firmer chair - not in soft sofa as she was    [facility] Adult PT Treatment:                                                DATE: 01/21/23 Therapeutic Exercise: R sidelying: Manual L hip stretching into adduction blocking pelvis to target L lateral hip Supine: Manual L hip stretching into adduction (R foot crossed over L leg) to target L lateral hip Standing: L lateral hip stretch (L foot placed behind R, R hand on wall for balance, reaching L hand overhead to wall) L hip drop from 4" step (reaching R foot to floor without bending L knee, R hand on wall for balance) reaching L hand overhead to wall Manual Therapy: R sidelying: Soft tissue mobilization of the L posterolateral hip musculature   PATIENT EDUCATION:  Access Code: [facility] URL: [email or web address] Date: 02/25/2023 Prepared by: Corlis Leak  Program Notes Added (1) L lateral hip stretch (L foot placed behind R, R hand on wall for balance, reaching L hand overhead to wall) (2) L hip drop from 4" step (reaching R foot to floor without bending L knee, R hand on wall for balance) reaching L hand overhead to wall  Exercises - Prone Press Up  - 2 x daily - 7 x weekly - 1 sets - 10 reps - 2-3 sec  hold - Prone Quadriceps Stretch with Strap  - 2 x daily - 7 x weekly - 1 sets - 3 reps - 30 sec  hold - Supine Piriformis Stretch with Leg Straight  - 2 x daily - 7 x weekly - 1 sets - 3 reps - 30 sec  hold - Hooklying Hamstring Stretch with  Strap  - 2 x daily - 7 x weekly - 1 sets - 3 reps - 30 sec  hold - Supine  Sciatic Nerve Glide  - 2 x daily - 7 x weekly - 1 sets - 8-10 reps - 1-2 sec  hold - Sit to Stand  - 2 x daily - 7 x weekly - 1 sets - 10 reps - 3-5 sec  hold - Step Up  - 2 x daily - 7 x weekly - 1 sets - 10 reps - 2 sec  hold - Lateral Step Up  - 2 x daily - 7 x weekly - 2 sets - 10 reps - 2 sec  hold - Single Leg Stance with Support  - 2 x daily - 7 x weekly - 1 sets - 3 reps - 20-30 sec  hold  ASSESSMENT:  CLINICAL IMPRESSION: Session focused on L LE strengthening and lumbar mobility. Patient did not tolerate prone position; prone exercises modified in sitting. Noted pelvic tucking during standing marching; cueing awareness and smaller ROM improved pelvic stability and alignment.  RE-EVAL: Patient returns after respiratory illness for several weeks requiring hospitalization for 3 days for fluids and medication. She reports that her back pain is improved with medication. She continues to have pain in the L LE posterior hip to ankle area. She demonstrates some increase in strength in the L > R LE but has continued LE weakness. Treatment consisted of stretching and strengthening. Will continue with physical therapy to address deficits.    Eval: Patient is a 67 y.o. female who was seen today for physical therapy evaluation and treatment for L lumbar radiculopathy. Patient has a history of LBP over the course of the past 8 or more years. She has a lumbar fusion (unknown level) 2008; cervical disc surgery 12/2021; lumbar laminectomy and foraminectomy L4/L5/SI 06/05/22. Patient presents with poor posture and alignment; limited trunk and LE mobility and ROM; core and LE weakness; sedentary lifestyle; pain on a daily basis; limited functional activity level. Patient will benefit from PT to address problems identified.    OBJECTIVE IMPAIRMENTS: Abnormal gait, decreased activity tolerance, decreased balance, decreased endurance,  decreased mobility, decreased ROM, decreased strength, increased fascial restrictions, increased muscle spasms, impaired flexibility, improper body mechanics, postural dysfunction, and pain.    GOALS: Goals reviewed with patient? Yes  SHORT TERM GOALS: Target date: 02/18/2023  Independent in initial HEP  Baseline: Goal status: on going   2.  Patient demonstrates and report improve sitting and standing postures/positions  Baseline:  Goal status: on going     LONG TERM GOALS: Target date: 05/06/2023  Decrease pain 50-70% allowing patient to increase activity level Baseline:  Goal status: on going   2.  Increase strength bilat LE's to 4/5 to 5/5  Baseline:  Goal status: on going   3.  Increase core strength and stability allowing patient to increase activity level tolerating 20-30 min of exercise without change in pain level  Baseline:  Goal status: on going   4.  Patient to demonstrate proper lifting techniques for functional lifts  Baseline:  Goal status: on going   5.  Independent in HEP including aquatic program as indicated  Baseline:  Goal status: on going   6.  Improve functional limitation score to 48  Baseline: 52 Goal status: met   PLAN:  PT FREQUENCY: 2x/week  PT DURATION: 12 weeks  PLANNED INTERVENTIONS: Therapeutic exercises, Therapeutic activity, Neuromuscular re-education, Balance training, Gait training, Patient/Family education, Self Care, Joint mobilization, Aquatic Therapy, Dry Needling, Electrical stimulation, Cryotherapy, Moist heat, Ultrasound, Ionotophoresis 4mg /ml Dexamethasone, Manual therapy, and Re-evaluation.  PLAN FOR NEXT SESSION: review  and progress exercises; continue with back care education and correction; manual work, DN, modalities as indicated - trial of TENS for pain management; schedule aquatic therapy (patient has participated in water exercise classes in the past)     Sanjuana Mae, PTA 03/25/2023, 10:11 AM

## 2023-03-25 NOTE — Progress Notes (Signed)
Refilled patient's gabapentin and bicarb.

## 2023-03-29 ENCOUNTER — Ambulatory Visit: Payer: Medicare Other

## 2023-03-29 ENCOUNTER — Other Ambulatory Visit (HOSPITAL_COMMUNITY): Payer: Self-pay

## 2023-03-29 DIAGNOSIS — I2583 Coronary atherosclerosis due to lipid rich plaque: Secondary | ICD-10-CM

## 2023-03-29 DIAGNOSIS — I700 Atherosclerosis of aorta: Secondary | ICD-10-CM | POA: Diagnosis not present

## 2023-03-29 DIAGNOSIS — D1803 Hemangioma of intra-abdominal structures: Secondary | ICD-10-CM | POA: Diagnosis not present

## 2023-03-29 DIAGNOSIS — J9859 Other diseases of mediastinum, not elsewhere classified: Secondary | ICD-10-CM | POA: Diagnosis not present

## 2023-03-29 DIAGNOSIS — R911 Solitary pulmonary nodule: Secondary | ICD-10-CM | POA: Diagnosis not present

## 2023-03-29 MED ORDER — IOHEXOL 300 MG/ML  SOLN
100.0000 mL | Freq: Once | INTRAMUSCULAR | Status: AC | PRN
  Administered 2023-03-29: 75 mL via INTRAVENOUS

## 2023-03-30 ENCOUNTER — Other Ambulatory Visit: Payer: Medicare Other

## 2023-03-31 DIAGNOSIS — H2511 Age-related nuclear cataract, right eye: Secondary | ICD-10-CM | POA: Diagnosis not present

## 2023-04-01 DIAGNOSIS — M5116 Intervertebral disc disorders with radiculopathy, lumbar region: Secondary | ICD-10-CM | POA: Diagnosis not present

## 2023-04-01 DIAGNOSIS — M5416 Radiculopathy, lumbar region: Secondary | ICD-10-CM | POA: Diagnosis not present

## 2023-04-01 DIAGNOSIS — M4807 Spinal stenosis, lumbosacral region: Secondary | ICD-10-CM | POA: Diagnosis not present

## 2023-04-01 DIAGNOSIS — M48061 Spinal stenosis, lumbar region without neurogenic claudication: Secondary | ICD-10-CM | POA: Diagnosis not present

## 2023-04-01 DIAGNOSIS — M4726 Other spondylosis with radiculopathy, lumbar region: Secondary | ICD-10-CM | POA: Diagnosis not present

## 2023-04-02 DIAGNOSIS — H25811 Combined forms of age-related cataract, right eye: Secondary | ICD-10-CM | POA: Diagnosis not present

## 2023-04-15 DIAGNOSIS — H40033 Anatomical narrow angle, bilateral: Secondary | ICD-10-CM | POA: Diagnosis not present

## 2023-04-15 DIAGNOSIS — Z961 Presence of intraocular lens: Secondary | ICD-10-CM | POA: Diagnosis not present

## 2023-04-15 DIAGNOSIS — Z9889 Other specified postprocedural states: Secondary | ICD-10-CM | POA: Diagnosis not present

## 2023-04-16 DIAGNOSIS — M545 Low back pain, unspecified: Secondary | ICD-10-CM | POA: Diagnosis not present

## 2023-04-16 DIAGNOSIS — Z79899 Other long term (current) drug therapy: Secondary | ICD-10-CM | POA: Diagnosis not present

## 2023-04-16 DIAGNOSIS — M542 Cervicalgia: Secondary | ICD-10-CM | POA: Diagnosis not present

## 2023-04-19 ENCOUNTER — Other Ambulatory Visit: Payer: Self-pay | Admitting: Pharmacist

## 2023-04-19 NOTE — Progress Notes (Signed)
   04/19/2023 Name: Anne [name] MRN: [id number] DOB: 1955/11/02  Chief Complaint  Patient presents with   Medication Management    Anne [name] is a 68 y.o. year old female who presented for a telephone visit.   They were referred to the pharmacist by their PCP for assistance in managing complex medication management.    Subjective:  Care Team: Primary Care Provider: Arellano Zameza, Richrd, MD ; Next Scheduled Visit: 05/27/23 Clinical Pharmacist: Aloysius Breeding, PharmD  Medication Access/Adherence  Current Pharmacy:  [facility] [phone] - [place], [place] - [address] AT [facility] [address] & [facility] [address] [place] [address] Phone: [phone] Fax: [phone]   Patient reports affordability concerns with their medications: Yes - Nicorette  gum Patient reports access/transportation concerns to their pharmacy: No  Patient reports adherence concerns with their medications:  No     Tobacco Abuse:  Tobacco Use History: Number of cigarettes per day 6 Smokes first cigarette around noon each day- waits several hours Does not wake at night to smoke  Quit Attempt History: Tried: Patches while in the hospital (no side effects)  Current medication access support: Traditional Medicare + [employer] ([employer]?)  Medication Management: - Reports good adherence to medications, but does NOT notify PCP when refills have run out  Objective:  Lab Results  Component Value Date   HGBA1C 5.6 10/27/2017    Lab Results  Component Value Date   CREATININE 1.02 03/01/2023   BUN 13 03/01/2023   NA 141 03/01/2023   K 4.1 03/01/2023   CL 105 03/01/2023   CO2 27 03/01/2023    Lab Results  Component Value Date   CHOL 145 03/01/2023   HDL 55 03/01/2023   LDLCALC 73 03/01/2023   TRIG 85 03/01/2023   CHOLHDL 2.6 03/01/2023    Medications Reviewed Today   Medications were not reviewed in this encounter     Assessment/Plan:   Tobacco  Abuse - Currently uncontrolled - Provided motivational interviewing to assess tobacco use and strategies for reduction - Prior recommendation of nicotine  lozenges 2mg  as needed. Counseled on proper use and potential side effects, including nausea, hiccups, cough, and heartburn. Advised to minimize the amount of nicotine  swallowed to reduce incidence of GI side effects    Medication Management: - Currently strategy sufficient to maintain appropriate adherence to prescribed medication regimen - Suggested use of weekly pill box to organize medications - Patient is getting each medication from [facility] currently and Humira mailed from a specialty pharmacy (should be getting over the next week to start- approved by PAP)    Follow Up Plan:  - No follow-up needed at this time as patient is adherent to medications - Would prefer to discuss further smoking cessation options at next PCP visit- did NOT getting the lozenges or starting trying to quit - [facility] is showing 81 count for $25 currently for 2mg  lozenges - [facility] also has 108 ct for $32 currently - Bupropion would be cheapest smoking cessation option    Aloysius Breeding, PharmD [facility] Phone Number: [phone]

## 2023-04-22 ENCOUNTER — Ambulatory Visit: Payer: Medicare Other

## 2023-04-28 ENCOUNTER — Ambulatory Visit: Payer: Medicare Other

## 2023-04-28 VITALS — Wt 163.0 lb

## 2023-04-28 DIAGNOSIS — Z0000 Encounter for general adult medical examination without abnormal findings: Secondary | ICD-10-CM | POA: Diagnosis not present

## 2023-04-28 NOTE — Patient Instructions (Signed)
 Ms. Anne [name] , Thank you for taking time to come for your Medicare Wellness Visit. I appreciate your ongoing commitment to your health goals. Please review the following plan we discussed and let me know if I can assist you in the future.   Referrals/Orders/Follow-Ups/Clinician Recommendations: Yes; Keep maintaining your health by keeping your appointments with Dr. Priscilla Arellano Zameza and any specialists that you may see.  Remember your goal for 2025 is to quit smoking.  You are doing a great job by cutting down the number of cigarettes daily.  Call us  if you need anything.  Have a great year!!!!  This is a list of the screening recommended for you and due dates:  Health Maintenance  Topic Date Due   DTaP/Tdap/Td vaccine (1 - Tdap) Never done   Zoster (Shingles) Vaccine (1 of 2) Never done   Mammogram  Never done   DEXA scan (bone density measurement)  Never done   COVID-19 Vaccine (4 - 2024-25 season) 12/13/2022   Pneumonia Vaccine (3 of 3 - PCV) 10/21/2023   Medicare Annual Wellness Visit  04/27/2024   Colon Cancer Screening  05/29/2032   Flu Shot  Completed   Hepatitis C Screening  Completed   HPV Vaccine  Aged Out    Advanced directives: (Declined) Advance directive discussed with you today. Even though you declined this today, please call our office should you change your mind, and we can give you the proper paperwork for you to fill out.  Next Medicare Annual Wellness Visit scheduled for next year: No

## 2023-04-28 NOTE — Progress Notes (Addendum)
Subjective:   Anne [name] is a 68 y.o. female who presents for Medicare Annual (Subsequent) preventive examination.  Visit Complete: Virtual I connected with  Aniceto Boss on 04/28/23 by a audio enabled telemedicine application and verified that I am speaking with the correct person using two identifiers.  Interactive audio and video telecommunications were attempted between this provider and patient, twice however failed, due to patient having technical difficulties OR patient did not have access to video capability.  We continued and completed visit with audio only.  Patient Location: Home  Provider Location: Home Office  I discussed the limitations of evaluation and management by telemedicine. The patient expressed understanding and agreed to proceed.  Vital Signs: Because this visit was a virtual/telehealth visit, some criteria may be missing or patient reported. Any vitals not documented were not able to be obtained and vitals that have been documented are patient reported.  Cardiac Risk Factors include: advanced age (>40men, >43 women);sedentary lifestyle;dyslipidemia;family history of premature cardiovascular disease;hypertension;diabetes mellitus;smoking/ tobacco exposure     Objective:    Today's Vitals   04/28/23 1346  Weight: 163 lb (73.9 kg)  PainSc: 0-No pain   Body mass index is 24.07 kg/m.     04/28/2023    1:48 PM 01/07/2023    2:03 PM 12/28/2022    9:57 AM 12/17/2022    9:59 PM 12/17/2022    3:41 PM 06/05/2022    6:18 AM 06/01/2022    2:20 PM  Advanced Directives  Does Patient Have a Medical Advance Directive? No No No No No No No  Would patient like information on creating a medical advance directive? No - Patient declined No - Patient declined No - Patient declined No - Patient declined  No - Patient declined No - Patient declined    Current Medications (verified) Outpatient Encounter Medications as of 04/28/2023  Medication Sig   adalimumab (HUMIRA, 2  PEN,) 80 MG/0.8ML pen Inject 160 mg into the skin once.   aspirin EC 81 MG tablet Take 1 tablet (81 mg total) by mouth daily. Swallow whole.   cyclobenzaprine (FLEXERIL) 5 MG tablet Take 1 tablet (5 mg total) by mouth at bedtime.   ezetimibe (ZETIA) 10 MG tablet TAKE 1 TABLET(10 MG) BY MOUTH AT BEDTIME   gabapentin (NEURONTIN) 400 MG capsule Take 1 capsule (400 mg total) by mouth 3 (three) times daily.   Multiple Vitamins-Minerals (CENTRUM MINIS WOMEN 50+ PO) Take 1 tablet by mouth daily.   nicotine polacrilex (NICOTINE MINI) 2 MG lozenge Take 1 lozenge (2 mg total) by mouth as needed for smoking cessation.   nitroGLYCERIN (NITROSTAT) 0.4 MG SL tablet Place 1 tablet (0.4 mg total) under the tongue every 5 (five) minutes x 3 doses as needed for chest pain.   oxyCODONE-acetaminophen (PERCOCET) 10-325 MG tablet Take 1 tablet by mouth every 6 (six) hours as needed for pain.   Potassium Gluconate 550 (90 K) MG TABS Take 1 tablet by mouth daily. OTC   rosuvastatin (CRESTOR) 20 MG tablet Take 1 tablet (20 mg total) by mouth daily.   sodium bicarbonate 650 MG tablet Take 3 tablets (1,950 mg total) by mouth 2 (two) times daily. TAKE 3 TABLETS (1950 MG) BY MOUTH TWICE DAILY   No facility-administered encounter medications on file as of 04/28/2023.    Allergies (verified) Patient has no known allergies.   History: Past Medical History:  Diagnosis Date   Coronary artery disease    Crohn's colitis (HCC)    Crohn's  disease (HCC)    GERD (gastroesophageal reflux disease)    High cholesterol    Hyperlipidemia    Hypertension    Hypokalemia 08/04/2019   STEMI (ST elevation myocardial infarction) (HCC)    10/27/17 PCI/DES x1 to the dRCA, with residual thrombus in the PLA, normal EF   Past Surgical History:  Procedure Laterality Date   ANTERIOR CERVICAL DECOMP/DISCECTOMY FUSION N/A 01/02/2022   Procedure: Anterior Cervical Decompression Fusion - Cervical three-Cervical four - Cervical four-Cervical  five - Cervical five-Cervical six;  Surgeon: Tia Alert, MD;  Location: [facility] OR;  Service: Neurosurgery;  Laterality: N/A;   BACK SURGERY  2014   done in [place]   BIOPSY  09/22/2019   Procedure: BIOPSY;  Surgeon: [name], MD;  Location: [facility];  Service: Endoscopy;;   BIOPSY  05/29/2022   Procedure: BIOPSY;  Surgeon: [name], MD;  Location: [facility];  Service: Gastroenterology;;   CHOLECYSTECTOMY  2005   COLONOSCOPY WITH PROPOFOL N/A 09/22/2019   Procedure: COLONOSCOPY WITH PROPOFOL;  Surgeon: [name], MD;  Location: [facility];  Service: Endoscopy;  Laterality: N/A;   COLONOSCOPY WITH PROPOFOL N/A 05/29/2022   Procedure: COLONOSCOPY WITH PROPOFOL;  Surgeon: [name], MD;  Location: [facility];  Service: Gastroenterology;  Laterality: N/A;   CORONARY/GRAFT ACUTE MI REVASCULARIZATION N/A 10/26/2017   Procedure: Coronary/Graft Acute MI Revascularization;  Surgeon: Lennette Bihari, MD;  Location: [facility] INVASIVE CV LAB;  Service: Cardiovascular;  Laterality: N/A;   FLEXIBLE SIGMOIDOSCOPY N/A 12/12/2021   Procedure: FLEXIBLE SIGMOIDOSCOPY;  Surgeon: [name], MD;  Location: [facility];  Service: Gastroenterology;  Laterality: N/A;   LEFT HEART CATH AND CORONARY ANGIOGRAPHY N/A 10/26/2017   Procedure: LEFT HEART CATH AND CORONARY ANGIOGRAPHY;  Surgeon: Lennette Bihari, MD;  Location: [facility] INVASIVE CV LAB;  Service: Cardiovascular;  Laterality: N/A;   LEFT HEART CATH AND CORONARY ANGIOGRAPHY Left 05/05/2018   Procedure: Left heart cath and coronary angiography;  Surgeon: Laurier Nancy, MD;  Location: [facility] INVASIVE CV LAB;  Service: Cardiovascular;  Laterality: Left;   LUMBAR LAMINECTOMY/DECOMPRESSION MICRODISCECTOMY Left 06/05/2022   Procedure: LAMINECTOMY AND FORAMINOTOMY - LUMBAR FOUR-LUMBAR FIVE - LUMBAR FIVE-SACRAL ONE - LEFT;  Surgeon: Tia Alert, MD;  Location: [facility] OR;  Service: Neurosurgery;  Laterality: Left;  3C   MUSCLE BIOPSY Left 08/10/2019    Procedure: THIGH MUSCLE BIOPSY;  Surgeon: Abigail Miyamoto, MD;  Location: [facility] OR;  Service: General;  Laterality: Left;   SMALL INTESTINE SURGERY     TONSILLECTOMY     removed as a child   Family History  Problem Relation Age of Onset   Stroke Mother    Crohn's disease Neg Hx    Social History   Socioeconomic History   Marital status: Single    Spouse name: Not on file   Number of children: 1   Years of education: Not on file   Highest education level: Not on file  Occupational History   Not on file  Tobacco Use   Smoking status: Former    Current packs/day: 0.00    Average packs/day: 0.3 packs/day for 45.0 years (11.3 ttl pk-yrs)    Types: Cigarettes    Start date: 11/18/1976    Quit date: 11/18/2021    Years since quitting: 1.4   Smokeless tobacco: Never  Vaping Use   Vaping status: Never Used  Substance and Sexual Activity   Alcohol use: No   Drug use: No   Sexual activity: Not on file  Other Topics Concern   Not on file  Social History Narrative   Not on file   Social Drivers of Health   Financial Resource Strain: Low Risk  (04/28/2023)   Overall Financial Resource Strain (CARDIA)    Difficulty of Paying Living Expenses: Not hard at all  Food Insecurity: No Food Insecurity (04/28/2023)   Hunger Vital Sign    Worried About Running Out of Food in the Last Year: Never true    Ran Out of Food in the Last Year: Never true  Transportation Needs: No Transportation Needs (04/28/2023)   PRAPARE - Administrator, Civil Service (Medical): No    Lack of Transportation (Non-Medical): No  Physical Activity: Inactive (04/28/2023)   Exercise Vital Sign    Days of Exercise per Week: 0 days    Minutes of Exercise per Session: 0 min  Stress: No Stress Concern Present (04/28/2023)   [employer] of Occupational Health - Occupational Stress Questionnaire    Feeling of Stress : Not at all  Social Connections: Moderately Integrated (04/28/2023)   Social Connection and  Isolation Panel [NHANES]    Frequency of Communication with Friends and Family: More than three times a week    Frequency of Social Gatherings with Friends and Family: More than three times a week    Attends Religious Services: More than 4 times per year    Active Member of [employer] or Organizations: Yes    Attends Engineer, structural: More than 4 times per year    Marital Status: Never married    Tobacco Counseling Counseling given: Not Answered   Clinical Intake:  Pre-visit preparation completed: Yes  Pain : No/denies pain Pain Score: 0-No pain     BMI - recorded: 24.07 Nutritional Status: BMI of 19-24  Normal Nutritional Risks: None Diabetes: No  How often do you need to have someone help you when you read instructions, pamphlets, or other written materials from your doctor or pharmacy?: 1 - Never What is the last grade level you completed in school?: HSG  Interpreter Needed?: No  Information entered by :: Daiwik Buffalo N. Hoyte Ziebell, LPN.   Activities of Daily Living    04/28/2023    1:50 PM 12/28/2022    9:56 AM  In your present state of health, do you have any difficulty performing the following activities:  Hearing? 0 0  Vision? 0 0  Difficulty concentrating or making decisions? 0 0  Walking or climbing stairs? 0 1  Dressing or bathing? 0 0  Doing errands, shopping? 0 0  Preparing Food and eating ? N   Using the Toilet? N   In the past six months, have you accidently leaked urine? N   Do you have problems with loss of bowel control? N   Managing your Medications? N   Managing your Finances? N   Housekeeping or managing your Housekeeping? N     Patient Care Team: Colbert Coyer, Alyson Locket, MD as PCP - General (Internal Medicine)  Indicate any recent Medical Services you may have received from other than [facility] providers in the past year (date may be approximate).     Assessment:   This is a routine wellness examination for [facility].  Hearing/Vision  screen Hearing Screening - Comments:: Denies hearing difficulties.  Vision Screening - Comments:: No eyeglasses; cataracts removed - up to date with routine eye exams with R. Fabian Sharp, MD.    Goals Addressed  This Visit's Progress    Quit Smoking       Goals for quitting smoking include: Setting a quit date: Choose a day to stop using tobacco products and stick to it. Planning for cravings: Identify triggers and create a plan to deal with them.  Avoiding situations that trigger cravings: Stay away from people and places that make you want to smoke.  Staying busy: Exercise, drink water, and try new activities to distract yourself.  Rewarding yourself: Celebrate small successes to stay motivated.  Getting support: Consider a buddy system, support group, or nicotine replacement therapy.  Forgiving yourself: If you have a setback, don't give up, and get back on track.      Depression Screen    04/28/2023    1:51 PM 12/28/2022    9:56 AM  PHQ 2/9 Scores  PHQ - 2 Score 0 0  PHQ- 9 Score 0     Fall Risk    04/28/2023    1:49 PM 12/28/2022    9:56 AM  Fall Risk   Falls in the past year? 0 1  Number falls in past yr: 0 0  Injury with Fall? 0 0  Risk for fall due to : No Fall Risks   Follow up Falls prevention discussed Falls evaluation completed;Falls prevention discussed    MEDICARE RISK AT HOME: Medicare Risk at Home Any stairs in or around the home?: Yes (3 flights stairs; apartment complex; no elevator) If so, are there any without handrails?: No Home free of loose throw rugs in walkways, pet beds, electrical cords, etc?: Yes Adequate lighting in your home to reduce risk of falls?: Yes Life alert?: No Use of a cane, walker or w/c?: No Grab bars in the bathroom?: No Shower chair or bench in shower?: No Elevated toilet seat or a handicapped toilet?: No  TIMED UP AND GO:  Was the test performed?  No    Cognitive Function:    04/28/2023    1:50 PM   MMSE - Mini Mental State Exam  Not completed: Unable to complete        04/28/2023    1:50 PM  6CIT Screen  What Year? 0 points  What month? 0 points  What time? 0 points  Count back from 20 0 points  Months in reverse 0 points  Repeat phrase 0 points  Total Score 0 points    Immunizations Immunization History  Administered Date(s) Administered   Fluad Trivalent(High Dose 65+) 02/26/2023   PFIZER(Purple Top)SARS-COV-2 Vaccination 12/19/2019, 01/29/2020   Pfizer Covid-19 Vaccine Bivalent Booster 37yrs & up 06/20/2020   Pneumococcal Polysaccharide-23 10/28/2017, 10/21/2022    TDAP status: Due, Education has been provided regarding the importance of this vaccine. Advised may receive this vaccine at local pharmacy or Health Dept. Aware to provide a copy of the vaccination record if obtained from local pharmacy or Health Dept. Verbalized acceptance and understanding.  Flu Vaccine status: Up to date  Pneumococcal vaccine status: Up to date  Covid-19 vaccine status: Completed vaccines  Qualifies for Shingles Vaccine? Yes   Zostavax completed No   Shingrix Completed?: No.    Education has been provided regarding the importance of this vaccine. Patient has been advised to call insurance company to determine out of pocket expense if they have not yet received this vaccine. Advised may also receive vaccine at local pharmacy or Health Dept. Verbalized acceptance and understanding.  Screening Tests Health Maintenance  Topic Date Due   DTaP/Tdap/Td (1 - Tdap) Never  done   Zoster Vaccines- Shingrix (1 of 2) Never done   MAMMOGRAM  Never done   DEXA SCAN  Never done   COVID-19 Vaccine (4 - 2024-25 season) 12/13/2022   Pneumonia Vaccine 33+ Years old (3 of 3 - PCV) 10/21/2023   Medicare Annual Wellness (AWV)  04/27/2024   Colonoscopy  05/29/2032   INFLUENZA VACCINE  Completed   Hepatitis C Screening  Completed   HPV VACCINES  Aged Out    Health Maintenance  Health Maintenance  Due  Topic Date Due   DTaP/Tdap/Td (1 - Tdap) Never done   Zoster Vaccines- Shingrix (1 of 2) Never done   MAMMOGRAM  Never done   DEXA SCAN  Never done   COVID-19 Vaccine (4 - 2024-25 season) 12/13/2022    Colorectal cancer screening: Type of screening: Colonoscopy. Completed 05/29/2022. Repeat every 10 years  Mammogram status: Patient stated that screening is scheduled for January 2025.  Bone Density screening: Never done.  Lung Cancer Screening: (Low Dose CT Chest recommended if Age 51-80 years, 20 pack-year currently smoking OR have quit w/in 15years.) does not qualify.   Lung Cancer Screening Referral: no  Additional Screening:  Hepatitis C Screening: does qualify; Completed 12/17/2022  Vision Screening: Recommended annual ophthalmology exams for early detection of glaucoma and other disorders of the eye. Is the patient up to date with their annual eye exam?  Yes  Who is the provider or what is the name of the office in which the patient attends annual eye exams? R. Fabian Sharp, MD. If pt is not established with a provider, would they like to be referred to a provider to establish care? No .   Dental Screening: Recommended annual dental exams for proper oral hygiene  Community Resource Referral / Chronic Care Management: CRR required this visit?  No   CCM required this visit?  No     Plan:     I have personally reviewed and noted the following in the patient's chart:   Medical and social history Use of alcohol, tobacco or illicit drugs  Current medications and supplements including opioid prescriptions. Patient is currently taking opioid prescriptions. Information provided to patient regarding non-opioid alternatives. Patient advised to discuss non-opioid treatment plan with their provider. Functional ability and status Nutritional status Physical activity Advanced directives List of other physicians Hospitalizations, surgeries, and ER visits in previous 12  months Vitals Screenings to include cognitive, depression, and falls Referrals and appointments  In addition, I have reviewed and discussed with patient certain preventive protocols, quality metrics, and best practice recommendations. A written personalized care plan for preventive services as well as general preventive health recommendations were provided to patient.     Mickeal Needy, LPN   05/27/863   After Visit Summary: (MyChart) Due to this being a telephonic visit, the after visit summary with patients personalized plan was offered to patient via MyChart   Nurse note: Immunizations were verified by [facility] and documented in patient's chart.

## 2023-05-07 DIAGNOSIS — Z1231 Encounter for screening mammogram for malignant neoplasm of breast: Secondary | ICD-10-CM | POA: Diagnosis not present

## 2023-05-20 ENCOUNTER — Other Ambulatory Visit: Payer: Self-pay | Admitting: Internal Medicine

## 2023-05-20 DIAGNOSIS — K5090 Crohn's disease, unspecified, without complications: Secondary | ICD-10-CM | POA: Diagnosis not present

## 2023-05-20 DIAGNOSIS — I2510 Atherosclerotic heart disease of native coronary artery without angina pectoris: Secondary | ICD-10-CM

## 2023-05-20 DIAGNOSIS — Z0000 Encounter for general adult medical examination without abnormal findings: Secondary | ICD-10-CM | POA: Diagnosis not present

## 2023-05-20 DIAGNOSIS — F17210 Nicotine dependence, cigarettes, uncomplicated: Secondary | ICD-10-CM | POA: Diagnosis not present

## 2023-05-20 DIAGNOSIS — Z79899 Other long term (current) drug therapy: Secondary | ICD-10-CM | POA: Diagnosis not present

## 2023-05-20 DIAGNOSIS — M542 Cervicalgia: Secondary | ICD-10-CM | POA: Diagnosis not present

## 2023-05-26 DIAGNOSIS — Z79899 Other long term (current) drug therapy: Secondary | ICD-10-CM | POA: Diagnosis not present

## 2023-05-27 ENCOUNTER — Ambulatory Visit (INDEPENDENT_AMBULATORY_CARE_PROVIDER_SITE_OTHER): Payer: Medicare Other | Admitting: Student

## 2023-05-27 ENCOUNTER — Other Ambulatory Visit: Payer: Self-pay | Admitting: *Deleted

## 2023-05-27 ENCOUNTER — Telehealth: Payer: Self-pay

## 2023-05-27 VITALS — BP 115/73 | HR 83 | Temp 97.8°F | Ht 69.0 in | Wt 165.7 lb

## 2023-05-27 DIAGNOSIS — J9859 Other diseases of mediastinum, not elsewhere classified: Secondary | ICD-10-CM | POA: Diagnosis not present

## 2023-05-27 DIAGNOSIS — E782 Mixed hyperlipidemia: Secondary | ICD-10-CM | POA: Diagnosis not present

## 2023-05-27 DIAGNOSIS — Z13820 Encounter for screening for osteoporosis: Secondary | ICD-10-CM

## 2023-05-27 DIAGNOSIS — Z0000 Encounter for general adult medical examination without abnormal findings: Secondary | ICD-10-CM

## 2023-05-27 DIAGNOSIS — M6282 Rhabdomyolysis: Secondary | ICD-10-CM | POA: Diagnosis not present

## 2023-05-27 DIAGNOSIS — I2111 ST elevation (STEMI) myocardial infarction involving right coronary artery: Secondary | ICD-10-CM

## 2023-05-27 DIAGNOSIS — K50119 Crohn's disease of large intestine with unspecified complications: Secondary | ICD-10-CM | POA: Diagnosis not present

## 2023-05-27 DIAGNOSIS — N2589 Other disorders resulting from impaired renal tubular function: Secondary | ICD-10-CM | POA: Diagnosis not present

## 2023-05-27 LAB — COMPLETE METABOLIC PANEL WITH GFR: EGFR: 47

## 2023-05-27 MED ORDER — SODIUM BICARBONATE 650 MG PO TABS
1950.0000 mg | ORAL_TABLET | Freq: Two times a day (BID) | ORAL | 5 refills | Status: DC
Start: 2023-05-27 — End: 2023-11-30

## 2023-05-27 MED ORDER — REPATHA 140 MG/ML ~~LOC~~ SOSY: 140.0000 mg | PREFILLED_SYRINGE | SUBCUTANEOUS | 3 refills | Status: DC

## 2023-05-27 NOTE — Progress Notes (Signed)
CC:  Chief Complaint  Patient presents with   Follow-up    Routine office visit /  medication refill   HPI:  [name] is a 68 y.o. female living with a history stated below and presents today for a follow-up. Please see problem based assessment and plan for additional details.  Past Medical History:  Diagnosis Date   Coronary artery disease    Crohn's colitis (HCC)    Crohn's disease (HCC)    GERD (gastroesophageal reflux disease)    High cholesterol    Hyperlipidemia    Hypertension    Hypokalemia 08/04/2019   STEMI (ST elevation myocardial infarction) (HCC)    10/27/17 PCI/DES x1 to the dRCA, with residual thrombus in the PLA, normal EF    Current Outpatient Medications on File Prior to Visit  Medication Sig Dispense Refill   loperamide (IMODIUM A-D) 2 MG tablet Take 2 mg by mouth as needed for diarrhea or loose stools.     midodrine (PROAMATINE) 5 MG tablet Take 5 mg by mouth daily.     adalimumab (HUMIRA, 2 PEN,) 80 MG/0.8ML pen Inject 160 mg into the skin once.     aspirin EC 81 MG tablet Take 1 tablet (81 mg total) by mouth daily. Swallow whole. 90 tablet 3   cyclobenzaprine (FLEXERIL) 5 MG tablet Take 1 tablet (5 mg total) by mouth at bedtime. 30 tablet 2   ezetimibe (ZETIA) 10 MG tablet TAKE 1 TABLET(10 MG) BY MOUTH AT BEDTIME 90 tablet 0   gabapentin (NEURONTIN) 400 MG capsule Take 1 capsule (400 mg total) by mouth 3 (three) times daily. 270 capsule 1   nitroGLYCERIN (NITROSTAT) 0.4 MG SL tablet Place 1 tablet (0.4 mg total) under the tongue every 5 (five) minutes x 3 doses as needed for chest pain. 25 tablet 2   oxyCODONE-acetaminophen (PERCOCET) 10-325 MG tablet Take 1 tablet by mouth every 6 (six) hours as needed for pain.     Potassium Gluconate 550 (90 K) MG TABS Take 1 tablet by mouth daily. OTC     No current facility-administered medications on file prior to visit.    Family History  Problem Relation Age of Onset   Stroke Mother    Crohn's  disease Neg Hx     Social History   Socioeconomic History   Marital status: Single    Spouse name: Not on file   Number of children: 1   Years of education: Not on file   Highest education level: Not on file  Occupational History   Not on file  Tobacco Use   Smoking status: Former    Current packs/day: 0.00    Average packs/day: 0.3 packs/day for 45.0 years (11.3 ttl pk-yrs)    Types: Cigarettes    Start date: 11/18/1976    Quit date: 11/18/2021    Years since quitting: 1.5   Smokeless tobacco: Never  Vaping Use   Vaping status: Never Used  Substance and Sexual Activity   Alcohol use: No   Drug use: No   Sexual activity: Not on file  Other Topics Concern   Not on file  Social History Narrative   Not on file   Social Drivers of Health   Financial Resource Strain: Low Risk  (04/28/2023)   Overall Financial Resource Strain (CARDIA)    Difficulty of Paying Living Expenses: Not hard at all  Food Insecurity: No Food Insecurity (04/28/2023)   Hunger Vital Sign    Worried About Running Out of  Food in the Last Year: Never true    Ran Out of Food in the Last Year: Never true  Transportation Needs: No Transportation Needs (04/28/2023)   PRAPARE - Administrator, Civil Service (Medical): No    Lack of Transportation (Non-Medical): No  Physical Activity: Inactive (04/28/2023)   Exercise Vital Sign    Days of Exercise per Week: 0 days    Minutes of Exercise per Session: 0 min  Stress: No Stress Concern Present (04/28/2023)   [employer] of Occupational Health - Occupational Stress Questionnaire    Feeling of Stress : Not at all  Social Connections: Moderately Integrated (04/28/2023)   Social Connection and Isolation Panel [NHANES]    Frequency of Communication with Friends and Family: More than three times a week    Frequency of Social Gatherings with Friends and Family: More than three times a week    Attends Religious Services: More than 4 times per year    Active  Member of [employer] or Organizations: Yes    Attends Banker Meetings: More than 4 times per year    Marital Status: Never married  Intimate Partner Violence: Not At Risk (04/28/2023)   Humiliation, Afraid, Rape, and Kick questionnaire    Fear of Current or Ex-Partner: No    Emotionally Abused: No    Physically Abused: No    Sexually Abused: No    Review of Systems: ROS negative except for what is noted on the assessment and plan.  Vitals:   05/27/23 1031  BP: 115/73  Pulse: 83  Temp: 97.8 F (36.6 C)  TempSrc: Oral  SpO2: 97%  Weight: 165 lb 11.2 oz (75.2 kg)  Height: 5\' 9"  (1.753 m)    Physical Exam: Constitutional: Sitting on chair in NAD HENT: normocephalic atraumatic, mucous membranes moist Eyes: conjunctiva non-erythematous Cardiovascular: regular rate and rhythm, no m/r/g Pulmonary/Chest: normal work of breathing on room air, lungs clear to auscultation bilaterally Abdominal: soft, non-tender, non-distended MSK: normal bulk and tone Neurological: alert & oriented x 3, no focal deficit Skin: warm and dry Psych: irritable affect  Assessment & Plan:    Patient discussed with Dr. Antony Contras  Crohn's disease of colon with complication [facility] Follow Dr. Elnoria Howard and started Humira. Has not seen any different in her diarrhea, but it may take some time for the new medication to have an effect.   Mixed hyperlipidemia Had two episodes of rhabdo. Patient continues to take Crestor. Discussed that given her significant cardiac history and cardiovascular risk she would benefit from having a medication that can control her hyperlipidemia. Plan to transition to Repatha. Will dc Zetia once she gets Repatha as Zetia may be contributing to her diarrhea.  Health care maintenance Patient is due for her Shingles vaccine but recently started Humira. Got mammogram done from outside facility no concerning findings on mammo. Pt willing to get her Dexa scan.   Mediastinal mass Ct  scan showing an unchanged well-circumscribed anterior mediastinal soft tissue nodule measuring 13 x 9 mm. Will need repeat in 6 months (09/2023)  Renal tubular acidosis type I Takes potassium 90K tabs anad sodium bicarb 1,950mg  daily. CMP at [facility] (as patient gets it cheaper there). Order for [facility]. Will need to check mag too.    Manuela Neptune, MD [facility] Internal Medicine, PGY-1 Phone: [phone] Date 05/27/2023 Time 10:14 PM

## 2023-05-27 NOTE — Telephone Encounter (Signed)
Prior Authorization for patient (Repatha 140MG /ML syringes) came through on cover my meds was submitted awaiting approval or denial.  ZOX:[id number]

## 2023-05-27 NOTE — Assessment & Plan Note (Signed)
Ct scan showing an unchanged well-circumscribed anterior mediastinal soft tissue nodule measuring 13 x 9 mm. Will need repeat in 6 months (09/2023)

## 2023-05-27 NOTE — Assessment & Plan Note (Signed)
Follow Dr. Elnoria Howard and started Humira. Has not seen any different in her diarrhea, but it may take some time for the new medication to have an effect.

## 2023-05-27 NOTE — Telephone Encounter (Signed)
Anne [name] (Anne [name]) PA Case ID #: [id number] Rx #: [id number] Need Help? Call us at [phone] Outcome Approved today by [facility] NCPDP 2017 Your request has been approved Authorization Expiration Date: 04/12/2024 Drug Repatha 140MG /ML syringes ePA cloud logo Form [employer] Electronic [place] Form [id number] NCPDP) Original Claim Info [id number]

## 2023-05-27 NOTE — Patient Instructions (Signed)
Thank you, Ms.Aniceto Boss for allowing [place] to provide your care today.   I have ordered the following labs for you:  Lab Orders         CMP14 + Anion Gap      Referrals ordered today:   Referral Orders  No referral(s) requested today     I have ordered the following medication/changed the following medications:   Start the following medications: Meds ordered this encounter  Medications   Evolocumab (REPATHA) 140 MG/ML SOSY    Sig: Inject 140 mg into the skin as directed. Every two weeks    Dispense:  2.1 mL    Refill:  3   sodium bicarbonate 650 MG tablet    Sig: Take 3 tablets (1,950 mg total) by mouth 2 (two) times daily. TAKE 3 TABLETS (1950 MG) BY MOUTH TWICE DAILY    Dispense:  180 tablet    Refill:  5     Follow up:  3 months     Remember: To stop the Crestor, continue taking Ezetimibe until we get approval for the Repatha. Please give [place] a call if you do not hear back about your mediations.  Should you have any questions or concerns please call the internal medicine clinic at [phone].     Manuela Neptune, MD [facility]

## 2023-05-27 NOTE — Assessment & Plan Note (Addendum)
Patient is due for her Shingles vaccine but recently started Humira. Got mammogram done from outside facility no concerning findings on mammo. Pt willing to get her Dexa scan.

## 2023-05-27 NOTE — Assessment & Plan Note (Signed)
Takes potassium 90K tabs anad sodium bicarb 1,950mg  daily. CMP at [facility] (as patient gets it cheaper there). Order for [facility] sent to [facility]. Will need to check mag too.

## 2023-05-27 NOTE — Assessment & Plan Note (Signed)
Had two episodes of rhabdo. Patient continues to take Crestor. Discussed that given her significant cardiac history and cardiovascular risk she would benefit from having a medication that can control her hyperlipidemia. Plan to transition to Repatha. Will dc Zetia once she gets Repatha as Zetia may be contributing to her diarrhea.

## 2023-05-27 NOTE — Assessment & Plan Note (Deleted)
Had two episodes of rhabdo. Patient continues to take Crestor. Discussed that given her significant cardiac history and cardiovascular risk she would benefit from having a medication that can control her hyperlipidemia. Plan to transition to Repatha. Will dc Zetia once she gets Repatha as Zetia may be contributing to her diarrhea.

## 2023-06-05 NOTE — Progress Notes (Signed)
 Internal Medicine Clinic Attending  Case discussed with the resident at the time of the visit.  We reviewed the resident's history and exam and pertinent patient test results.  I agree with the assessment, diagnosis, and plan of care documented in the resident's note.

## 2023-06-05 NOTE — Addendum Note (Signed)
 Addended by: Burnell Blanks on: 06/05/2023 11:06 AM   Modules accepted: Level of Service

## 2023-06-11 ENCOUNTER — Telehealth: Payer: Self-pay | Admitting: Cardiology

## 2023-06-11 NOTE — Telephone Encounter (Signed)
 Call from patient staes that she was told that she would be getting a new medication for her Cholesterol.  Call to Pharmacy patient has been ordered Repatha.  Patient did not pick up the medication.  RTC to patient informed her that the doctor did order her something for Cholesterol the Repatha.  Patient was unaware that it had been sent in nor the name of the medication.  Call to Pharmacy Repatha is out but should be coming in tomorrow morning.  Spoke with patient informed her that medication will be at the Pharmacy tomorrow.

## 2023-06-11 NOTE — Telephone Encounter (Signed)
   Pt c/o of Chest Pain: STAT if active CP, including tightness, pressure, jaw pain, radiating pain to shoulder/upper arm/back, CP unrelieved by Nitro. Symptoms reported of SOB, nausea, vomiting, sweating.  1. Are you having CP right now? Yes; used the word ache    2. Are you experiencing any other symptoms (ex. SOB, nausea, vomiting, sweating)? tired   3. Is your CP continuous or coming and going? continuous   4. Have you taken Nitroglycerin? No    5. How long have you been experiencing CP? A month ago and has progressively gotten worse   6. If NO CP at time of call then end call with telling Pt to call back or call 911 if Chest pain returns prior to return call from triage team.

## 2023-06-11 NOTE — Telephone Encounter (Signed)
 Late entry  spoke to patient initial   incoming call.  Patient states  she has an aching feeling  left side of breast.  She states when she wears a bra the aching is less   this has been occurring 2 weeks to month or more.   RN asked if it is chest pain ,patient states not it is an ache.   No radiation of discomfort  ,no unusual shortness of breath.    Patient states she had more of  indigestion discomfort in 04/2018 with  cardiac cath.  RN informed patient symptoms is not classic chest pain symptoms.  Patient states she did not mention to primary.  RN offered appointment . Patient request an appt on 06/25/23 at 2:[facility]  She's states she has  multi appts  on March 6 ,7 and 13   She request a Thursday or Friday- she states son can bring her to the appointment.     Address given because she states she sees Dr Jens Som in [place].  Patient also states one of the appointments is with her pain provider.  RN informed patient to mention symptom to provider.  Patient voiced understanding

## 2023-06-14 ENCOUNTER — Telehealth: Payer: Self-pay | Admitting: *Deleted

## 2023-06-14 NOTE — Telephone Encounter (Signed)
 Call from pt stating she cannot give herself Repatha (injectable). Stated she does not like needle. Requesting a change in medication. Thanks

## 2023-06-16 NOTE — Telephone Encounter (Addendum)
 Pt was called to schedule an appt to talk about cholesterol meds along with her son per Dr Ned Card. Pt stated she cannot give herself Repatha; stated it's not like the Humira pen.; it has a needle. But she stated her niece (a Theatre stage manager) was there (went back to [place]) and gave her the first injection. Stated next one is due March 15- stated her son will give it to her. Stated her son works and she has upcoming appts,cannot come this week but can come at next appt. She has no appt - call transfer to front office to schedule; appt schedule with Dr Rayvon Char 3/27.

## 2023-06-16 NOTE — Telephone Encounter (Signed)
 Informed pt she does not need to come in if her son will be giving her the injections. Pt stated she wants to keep the appt and come in.

## 2023-06-16 NOTE — Telephone Encounter (Signed)
Pt called - no answer; left message to call the office.

## 2023-06-18 DIAGNOSIS — Z79899 Other long term (current) drug therapy: Secondary | ICD-10-CM | POA: Diagnosis not present

## 2023-06-18 DIAGNOSIS — M542 Cervicalgia: Secondary | ICD-10-CM | POA: Diagnosis not present

## 2023-06-18 DIAGNOSIS — K5090 Crohn's disease, unspecified, without complications: Secondary | ICD-10-CM | POA: Diagnosis not present

## 2023-06-22 DIAGNOSIS — Z79899 Other long term (current) drug therapy: Secondary | ICD-10-CM | POA: Diagnosis not present

## 2023-06-23 NOTE — Progress Notes (Signed)
 Cardiology Office Note    Date:  06/26/2023  ID:  Anne [name], DOB January 30, 1956, MRN [id number] PCP:  Colbert Coyer, Alyson Locket, MD  Cardiologist:  Olga Millers, MD  Electrophysiologist:  None   Chief Complaint: Chest pain.   History of Present Illness: .    Anne [name] is a 68 y.o. female with visit-pertinent history of CAD, hypertension, hyperlipidemia, tobacco use and Crohn's colitis.  In July 2019 patient had an acute inferior myocardial infarction.  Cardiac catheterization revealed 95% proximal RCA with extensive thrombus formation, underwent PCI of the right coronary artery.  She had repeat catheterization in January 2020 by Dr. Lennette Bihari showing a distal 30% stenosis but essentially normal coronary arteries by report.  Echocardiogram in January 2023 showed normal LV function.  Nuclear study in September 2023 showed no ischemia and normal perfusion, ejection fraction 46% but visually better.  She was last in clinic on 07/01/2022 by Dr. Jens Som, she remained stable for cardiac standpoint.  Today she presents regarding a episode of chest pain, when waking in the the morning a few weeks ago she noted a sensation of discomfort in the left chest in 1 specific spot, denied radiation or associated symptoms.  She noted that she would press on the area with some improvement in symptoms also improved when wearing a bra.  She denies any chest pain on exertion.  She denies shortness of breath, lower extreme edema, orthopnea, palpitations or PND. Labwork independently reviewed: 05/27/2023: Sodium 141, potassium 3.5, creatinine 1.24  ROS: .   Today she denies shortness of breath, lower extremity edema, fatigue, palpitations, melena, hematuria, hemoptysis, diaphoresis, weakness, presyncope, syncope, orthopnea, and PND.  All other systems are reviewed and otherwise negative. Studies Reviewed: Marland Kitchen   EKG:  EKG is ordered today, personally reviewed, demonstrating  EKG Interpretation Date/Time:  Friday  June 25 2023 15:10:13 EDT Ventricular Rate:  68 PR Interval:  126 QRS Duration:  80 QT Interval:  440 QTC Calculation: 467 R Axis:   1  Text Interpretation: Normal sinus rhythm Low voltage QRS Confirmed by Reather Littler [phone]) on 06/25/2023 3:28:28 PM   CV Studies: Cardiac studies reviewed are outlined and summarized above. Otherwise please see EMR for full report. Cardiac Studies & Procedures   ______________________________________________________________________________________________ CARDIAC CATHETERIZATION  CARDIAC CATHETERIZATION 05/05/2018  Narrative  Dist RCA lesion is 30% stenosed.  Essentially normal coronaries.  Findings Coronary Findings Diagnostic  Dominance: Right  Right Coronary Artery Dist RCA lesion is 30% stenosed.  Intervention  No interventions have been documented.   CARDIAC CATHETERIZATION  CARDIAC CATHETERIZATION 10/26/2017  Narrative  Post Atrio lesion is 100% stenosed.  Ost RPDA lesion is 20% stenosed.  Post intervention, there is a 0% residual stenosis.  Dist RCA lesion is 95% stenosed.  A stent was successfully placed.  Acute ST segment elevation inferior myocardial infarction secondary to RCA thrombotic occlusion.  Normal LAD, large ramus intermediate, and left circumflex coronary arteries.  Large dominant RCA with 95% stenosis proximal to the PDA takeoff with extensive thrombus in distal elevation to the distal PLA vessel.  Hypotension secondary to volume depletion with LVEDP 4 mmHg treated with aggressive fluid resuscitation.  Successful PCI to the dominant RCA with low level PTCA to the PLA vessel, and PTCA/DES stenting to the RCA proximal to the PDA takeoff with insertion of a 3.0 x 16 mm Synergy DES stent postdilated to 3.25 mm with the greater than 95% thrombotic stenosis reduced to 0% with TIMI-3 flow.  There is residual distal  PLA allusion resulting from thrombus for which Aggrastat will be continued for 18 hours post  procedure.  RECOMMENDATION: Recommend uninterrupted dual antiplatelet therapy with Aspirin 81mg  daily and Ticagrelor 90mg  twice daily for a minimum of 12 months (ACS - Class I recommendation).  Patient will be hydrated post procedure.  Continue Aggrastat for 18 hours.  Once blood pressure remains stable will add beta-blocker therapy for improved heart rate control.  Initiate high potency statin therapy.  Plan 2D echo Doppler study in a.m.  Findings Coronary Findings Diagnostic  Dominance: Right  Right Coronary Artery Dist RCA lesion is 95% stenosed.  Right Posterior Descending Artery Ost RPDA lesion is 20% stenosed.  Right Posterior Atrioventricular Artery Post Atrio lesion is 100% stenosed.  Intervention  Dist RCA lesion Stent A stent was successfully placed. Post-Intervention Lesion Assessment The intervention was successful. Pre-interventional TIMI flow is 2. Post-intervention TIMI flow is 3. No complications occurred at this lesion. There is a 0% residual stenosis post intervention.   STRESS TESTS  MYOCARDIAL PERFUSION IMAGING 01/01/2022  Narrative   The study is low risk.   No ST deviation was noted.   LV perfusion is normal. There is no evidence of ischemia. There is no evidence of infarction. There is a mild inferior wall perfusion defect on rest and stress with an adjacent loop of bowel with significant extracardiac tracer uptake, likely contributing to attenuation artifact.   Left ventricular function is calculated as abnormal. There were no regional wall motion abnormalities. Nuclear stress EF: 46 %, however visually appears closer to 50-55%. The left ventricular ejection fraction is mildly decreased (45-54%). End diastolic cavity size is normal. End systolic cavity size is normal.   Prior study available for comparison from 03/13/2021. Prior study unavailable for side by side comparison.   ECHOCARDIOGRAM  ECHOCARDIOGRAM COMPLETE 10/27/2017  Narrative *[facility]* *[facility]* [address] [place], [place] [address] [phone]  ------------------------------------------------------------------- Transthoracic Echocardiography  Patient:    Aissatou, Fronczak MR #:       [id number] Study Date: 10/27/2017 Gender:     F Age:        20 Height:     175.3 cm Weight:     71.4 kg BSA:        1.87 m^2 Pt. Status: Room:       2H25C  ADMITTING    Nicki Guadalajara, M.D. ATTENDING    Nicki Guadalajara, M.D. ORDERING     Nicki Guadalajara, M.D. SONOGRAPHER  Lauren Pennington, RDCS, CCT PERFORMING   [facility], Inpatient  cc:  ------------------------------------------------------------------- LV EF: 50% -   55%  ------------------------------------------------------------------- Indications:      Chest pain 786.51.  ------------------------------------------------------------------- History:   PMH:  STEMI.  Dyspnea.  ------------------------------------------------------------------- Study Conclusions  - Left ventricle: The cavity size was normal. Systolic function was normal. The estimated ejection fraction was in the range of 50% to 55%. Wall motion was normal; there were no regional wall motion abnormalities. Left ventricular diastolic function parameters were normal. - Aortic valve: Transvalvular velocity was within the normal range. There was no stenosis. There was no regurgitation. - Mitral valve: Transvalvular velocity was within the normal range. There was no evidence for stenosis. There was mild regurgitation. - Right ventricle: The cavity size was normal. Wall thickness was normal. Systolic function was normal. - Tricuspid valve: There was no regurgitation.  ------------------------------------------------------------------- Study data:  No prior study was available for comparison.  Study status:  Routine.  Procedure:  The patient reported no  pain pre or post test. Transthoracic echocardiography. Image quality was  fair. Study completion:  There were no complications. Transthoracic echocardiography.  M-mode, complete 2D, spectral Doppler, and color Doppler.  Birthdate:  Patient birthdate: December 15, 1955.  Age:  Patient is 68 yr old.  Sex:  Gender: female. BMI: 23.2 kg/m^2.  Blood pressure:     98/71  Patient status: Inpatient.  Study date:  Study date: 10/27/2017. Study time: 04:55 PM.  Location:  ICU/CCU  -------------------------------------------------------------------  ------------------------------------------------------------------- Left ventricle:  The cavity size was normal. Systolic function was normal. The estimated ejection fraction was in the range of 50% to 55%. Wall motion was normal; there were no regional wall motion abnormalities. The transmitral flow pattern was normal. The deceleration time of the early transmitral flow velocity was normal. The pulmonary vein flow pattern was normal. The tissue Doppler parameters were normal. Left ventricular diastolic function parameters were normal.  ------------------------------------------------------------------- Aortic valve:   Trileaflet; normal thickness leaflets. Mobility was not restricted.  Doppler:  Transvalvular velocity was within the normal range. There was no stenosis. There was no regurgitation.  ------------------------------------------------------------------- Aorta:  Aortic root: The aortic root was normal in size.  ------------------------------------------------------------------- Mitral valve:   Structurally normal valve.   Mobility was not restricted.  Doppler:  Transvalvular velocity was within the normal range. There was no evidence for stenosis. There was mild regurgitation.    Peak gradient (D): 2 mm Hg.  ------------------------------------------------------------------- Left atrium:  The atrium was normal in size.  ------------------------------------------------------------------- Right ventricle:  The  cavity size was normal. Wall thickness was normal. Systolic function was normal.  ------------------------------------------------------------------- Pulmonic valve:    Structurally normal valve.   Cusp separation was normal.  Doppler:  Transvalvular velocity was within the normal range. There was no evidence for stenosis. There was no regurgitation.  ------------------------------------------------------------------- Tricuspid valve:   Structurally normal valve.    Doppler: Transvalvular velocity was within the normal range. There was no regurgitation.  ------------------------------------------------------------------- Pulmonary artery:   The main pulmonary artery was normal-sized. Systolic pressure could not be accurately estimated.  ------------------------------------------------------------------- Right atrium:  The atrium was normal in size.  ------------------------------------------------------------------- Pericardium:  There was no pericardial effusion.  ------------------------------------------------------------------- Systemic veins: Inferior vena cava: The vessel was normal in size. The respirophasic diameter changes were in the normal range (>= 50%), consistent with normal central venous pressure.  ------------------------------------------------------------------- Measurements  Left ventricle                         Value        Reference LV ID, ED, PLAX chordal                48    mm     43 - 52 LV ID, ES, PLAX chordal                35    mm     23 - 38 LV fx shortening, PLAX chordal (L)     27    %      >=29 LV PW thickness, ED                    8     mm     ---------- IVS/LV PW ratio, ED                    1.13         <=1.3 Stroke volume, 2D  51    ml     ---------- Stroke volume/bsa, 2D                  27    ml/m^2 ---------- LV e&', lateral                         11.7  cm/s   ---------- LV E/e&', lateral                        6.26         ---------- LV e&', medial                          8.59  cm/s   ---------- LV E/e&', medial                        8.52         ---------- LV e&', average                         10.15 cm/s   ---------- LV E/e&', average                       7.22         ----------  Ventricular septum                     Value        Reference IVS thickness, ED                      9     mm     ----------  LVOT                                   Value        Reference LVOT ID, S                             20    mm     ---------- LVOT area                              3.14  cm^2   ---------- LVOT peak velocity, S                  82.8  cm/s   ---------- LVOT mean velocity, S                  53.9  cm/s   ---------- LVOT VTI, S                            16.2  cm     ----------  Aorta                                  Value        Reference Aortic root ID, ED                     31    mm     ----------  Left atrium                            Value        Reference LA ID, A-P, ES                         32    mm     ---------- LA ID/bsa, A-P                         1.71  cm/m^2 <=2.2 LA volume, S                           32.1  ml     ---------- LA volume/bsa, S                       17.2  ml/m^2 ---------- LA volume, ES, 1-p A4C                 26.5  ml     ---------- LA volume/bsa, ES, 1-p A4C             14.2  ml/m^2 ---------- LA volume, ES, 1-p A2C                 38.7  ml     ---------- LA volume/bsa, ES, 1-p A2C             20.7  ml/m^2 ----------  Mitral valve                           Value        Reference Mitral E-wave peak velocity            73.2  cm/s   ---------- Mitral A-wave peak velocity            53.8  cm/s   ---------- Mitral deceleration time       (H)     289   ms     150 - 230 Mitral peak gradient, D                2     mm Hg  ---------- Mitral E/A ratio, peak                 1.4          ----------  Right atrium                           Value         Reference RA ID, S-I, ES, A4C                    41.2  mm     34 - 49 RA area, ES, A4C                       11.2  cm^2   8.3 - 19.5 RA volume, ES, A/L                     24.7  ml     ---------- RA volume/bsa, ES, A/L                 13.2  ml/m^2 ----------  Systemic veins                         Value        Reference Estimated CVP                          3     mm Hg  ----------  Right ventricle                        Value        Reference TAPSE                                  22.3  mm     ---------- RV s&', lateral, S                      12.9  cm/s   ----------  Legend: (L)  and  (H)  mark values outside specified reference range.  ------------------------------------------------------------------- Prepared and Electronically Authenticated by  Chilton Si, MD 2019-07-17T19:03:36          ______________________________________________________________________________________________       Current Reported Medications:.    No outpatient medications have been marked as taking for the 06/25/23 encounter (Office Visit) with Rip Harbour, NP.    Physical Exam:    VS:  BP 136/64   Pulse 67   Ht 5\' 9"  (1.753 m)   Wt 167 lb 9.6 oz (76 kg)   LMP 02/18/2001   SpO2 99%   BMI 24.75 kg/m    Wt Readings from Last 3 Encounters:  06/25/23 167 lb 9.6 oz (76 kg)  05/27/23 165 lb 11.2 oz (75.2 kg)  04/28/23 163 lb (73.9 kg)    GEN: Well nourished, well developed in no acute distress NECK: No JVD; No carotid bruits CARDIAC: RRR, no murmurs, rubs, gallops RESPIRATORY:  Clear to auscultation without rales, wheezing or rhonchi  ABDOMEN: Soft, non-tender, non-distended EXTREMITIES:  No edema; No acute deformity     Asessement and Plan:.    CAD: Patient with history of acute inferior MI in 2019, cardiac cath station revealed 95% proximal RCA with extensive thrombus formation, underwent PCI of the right coronary artery.  Catheterization in January 2020 showed a distal 30%  stenosis but essentially normal coronaries per report.  Nuclear study in September 2023 showed no ischemia and normal perfusion, EF 46 but visually better.  Today she reports an episode of chest pain few weeks ago when waking in the morning, located in the left chest, denies radiation or associated symptoms.  Noted improvement with palpation and with wearing a bra.  Discussed with patient given change with palpation improvement and wearing a bra likely not cardiac in nature.  She denies any chest pain with exertion.  Encouraged patient to continue to monitor symptoms.  Reviewed ED precautions.  Continue aspirin 81 mg daily, Repatha.  Hyperlipidemia: Last lipid profile on 03/01/2023 indicated total cholesterol 145, HDL 55, triglycerides 85 and LDL 73. Recently stopped rosuvastatin and ezetimibe, started on Repatha.  This has been managed by her PCP.  Hx of tobacco use: Patient reports that she is currently working to decrease her tobacco use.  Notes she is slowly decreasing her use of cigarettes.  Congratulated and encouraged complete cessation.  History of hypotension: On midodrine 5 mg daily.  Reports  her hypertension is overall been well-controlled.  Denies any recent dizziness or lightheadedness.  Denies any presyncope or syncope.    Disposition: F/u with Dr. Jens Som in six months or sooner if needed.   Signed, Rip Harbour, NP

## 2023-06-25 ENCOUNTER — Ambulatory Visit: Payer: Medicare Other | Attending: Cardiology | Admitting: Cardiology

## 2023-06-25 VITALS — BP 136/64 | HR 67 | Ht 69.0 in | Wt 167.6 lb

## 2023-06-25 DIAGNOSIS — I10 Essential (primary) hypertension: Secondary | ICD-10-CM

## 2023-06-25 DIAGNOSIS — Z720 Tobacco use: Secondary | ICD-10-CM | POA: Diagnosis not present

## 2023-06-25 DIAGNOSIS — I2510 Atherosclerotic heart disease of native coronary artery without angina pectoris: Secondary | ICD-10-CM | POA: Insufficient documentation

## 2023-06-25 DIAGNOSIS — I959 Hypotension, unspecified: Secondary | ICD-10-CM | POA: Diagnosis not present

## 2023-06-25 DIAGNOSIS — E782 Mixed hyperlipidemia: Secondary | ICD-10-CM | POA: Diagnosis not present

## 2023-06-25 NOTE — Patient Instructions (Signed)
 Medication Instructions:  No changes *If you need a refill on your cardiac medications before your next appointment, please call your pharmacy*  Lab Work: No labs If you have labs (blood work) drawn today and your tests are completely normal, you will receive your results only by: MyChart Message (if you have MyChart) OR A paper copy in the mail If you have any lab test that is abnormal or we need to change your treatment, we will call you to review the results.  Testing/Procedures: No testing  Follow-Up: At [facility], you and your health needs are our priority.  As part of our continuing mission to provide you with exceptional heart care, we have created designated Provider Care Teams.  These Care Teams include your primary Cardiologist (physician) and Advanced Practice Providers (APPs -  Physician Assistants and Nurse Practitioners) who all work together to provide you with the care you need, when you need it.  We recommend signing up for the patient portal called "MyChart".  Sign up information is provided on this After Visit Summary.  MyChart is used to connect with patients for Virtual Visits (Telemedicine).  Patients are able to view lab/test results, encounter notes, upcoming appointments, etc.  Non-urgent messages can be sent to your provider as well.   To learn more about what you can do with MyChart, go to [email or web address].    Your next appointment:   6 month(s)  Provider:   Olga Millers, MD

## 2023-06-26 ENCOUNTER — Encounter: Payer: Self-pay | Admitting: Cardiology

## 2023-06-30 DIAGNOSIS — K5090 Crohn's disease, unspecified, without complications: Secondary | ICD-10-CM | POA: Diagnosis not present

## 2023-06-30 DIAGNOSIS — R197 Diarrhea, unspecified: Secondary | ICD-10-CM | POA: Diagnosis not present

## 2023-07-08 ENCOUNTER — Ambulatory Visit: Admitting: Student

## 2023-07-08 VITALS — BP 117/85 | HR 72 | Temp 97.7°F | Ht 69.0 in | Wt 171.0 lb

## 2023-07-08 DIAGNOSIS — Z0000 Encounter for general adult medical examination without abnormal findings: Secondary | ICD-10-CM

## 2023-07-08 DIAGNOSIS — E782 Mixed hyperlipidemia: Secondary | ICD-10-CM | POA: Diagnosis not present

## 2023-07-08 DIAGNOSIS — Z13820 Encounter for screening for osteoporosis: Secondary | ICD-10-CM

## 2023-07-08 DIAGNOSIS — M62838 Other muscle spasm: Secondary | ICD-10-CM

## 2023-07-08 DIAGNOSIS — M810 Age-related osteoporosis without current pathological fracture: Secondary | ICD-10-CM

## 2023-07-08 MED ORDER — BEMPEDOIC ACID 180 MG PO TABS: 180.0000 mg | ORAL_TABLET | Freq: Every day | ORAL | 3 refills | Status: DC

## 2023-07-08 MED ORDER — CYCLOBENZAPRINE HCL 5 MG PO TABS: 5.0000 mg | ORAL_TABLET | Freq: Every day | ORAL | 2 refills | Status: AC

## 2023-07-08 NOTE — Progress Notes (Unsigned)
 CC: Hyperlipidemia  HPI: [name] is a 68 y.o. female living with a history stated below and presents today for hyperlipidemia. Please see problem based assessment and plan for additional details.  Past Medical History:  Diagnosis Date   Coronary artery disease    Crohn's colitis (HCC)    Crohn's disease (HCC)    GERD (gastroesophageal reflux disease)    High cholesterol    Hyperlipidemia    Hypertension    Hypokalemia 08/04/2019   STEMI (ST elevation myocardial infarction) (HCC)    10/27/17 PCI/DES x1 to the dRCA, with residual thrombus in the PLA, normal EF    Current Outpatient Medications on File Prior to Visit  Medication Sig Dispense Refill   adalimumab (HUMIRA, 2 PEN,) 80 MG/0.8ML pen Inject 160 mg into the skin once.     aspirin EC 81 MG tablet Take 1 tablet (81 mg total) by mouth daily. Swallow whole. 90 tablet 3   gabapentin (NEURONTIN) 400 MG capsule Take 1 capsule (400 mg total) by mouth 3 (three) times daily. 270 capsule 1   loperamide (IMODIUM A-D) 2 MG tablet Take 2 mg by mouth as needed for diarrhea or loose stools.     midodrine (PROAMATINE) 5 MG tablet Take 5 mg by mouth daily.     nitroGLYCERIN (NITROSTAT) 0.4 MG SL tablet Place 1 tablet (0.4 mg total) under the tongue every 5 (five) minutes x 3 doses as needed for chest pain. 25 tablet 2   oxyCODONE-acetaminophen (PERCOCET) 10-325 MG tablet Take 1 tablet by mouth every 6 (six) hours as needed for pain.     Potassium Gluconate 550 (90 K) MG TABS Take 1 tablet by mouth daily. OTC     sodium bicarbonate 650 MG tablet Take 3 tablets (1,950 mg total) by mouth 2 (two) times daily. TAKE 3 TABLETS (1950 MG) BY MOUTH TWICE DAILY 180 tablet 5   No current facility-administered medications on file prior to visit.    Family History  Problem Relation Age of Onset   Stroke Mother    Crohn's disease Neg Hx     Social History   Socioeconomic History   Marital status: Single    Spouse name: Not on file    Number of children: 1   Years of education: Not on file   Highest education level: Not on file  Occupational History   Not on file  Tobacco Use   Smoking status: Former    Current packs/day: 0.00    Average packs/day: 0.3 packs/day for 45.0 years (11.3 ttl pk-yrs)    Types: Cigarettes    Start date: 11/18/1976    Quit date: 11/18/2021    Years since quitting: 1.6   Smokeless tobacco: Never  Vaping Use   Vaping status: Never Used  Substance and Sexual Activity   Alcohol use: No   Drug use: No   Sexual activity: Not on file  Other Topics Concern   Not on file  Social History Narrative   Not on file   Social Drivers of Health   Financial Resource Strain: Low Risk  (04/28/2023)   Overall Financial Resource Strain (CARDIA)    Difficulty of Paying Living Expenses: Not hard at all  Food Insecurity: No Food Insecurity (04/28/2023)   Hunger Vital Sign    Worried About Running Out of Food in the Last Year: Never true    Ran Out of Food in the Last Year: Never true  Transportation Needs: No Transportation Needs (04/28/2023)  PRAPARE - Administrator, Civil Service (Medical): No    Lack of Transportation (Non-Medical): No  Physical Activity: Inactive (04/28/2023)   Exercise Vital Sign    Days of Exercise per Week: 0 days    Minutes of Exercise per Session: 0 min  Stress: No Stress Concern Present (04/28/2023)   [employer] of Occupational Health - Occupational Stress Questionnaire    Feeling of Stress : Not at all  Social Connections: Moderately Integrated (04/28/2023)   Social Connection and Isolation Panel [NHANES]    Frequency of Communication with Friends and Family: More than three times a week    Frequency of Social Gatherings with Friends and Family: More than three times a week    Attends Religious Services: More than 4 times per year    Active Member of [employer] or Organizations: Yes    Attends Banker Meetings: More than 4 times per year    Marital  Status: Never married  Intimate Partner Violence: Not At Risk (04/28/2023)   Humiliation, Afraid, Rape, and Kick questionnaire    Fear of Current or Ex-Partner: No    Emotionally Abused: No    Physically Abused: No    Sexually Abused: No    Review of Systems: ROS negative except for what is noted on the assessment and plan.  Vitals:   07/08/23 1404  BP: 117/85  Pulse: 72  Temp: 97.7 F (36.5 C)  TempSrc: Oral  SpO2: 100%  Weight: 171 lb (77.6 kg)  Height: 5\' 9"  (1.753 m)    Physical Exam: Constitutional: well-appearing in no acute distress HENT: normocephalic atraumatic, mucous membranes moist Eyes: conjunctiva non-erythematous Neck: supple Cardiovascular: regular rate and rhythm, no m/r/g Pulmonary/Chest: normal work of breathing on room air, lungs clear to auscultation bilaterally Abdominal: soft, non-tender, non-distended MSK: normal bulk and tone Neurological: alert & oriented x 3, 5/5 strength in bilateral upper and lower extremities, normal gait Skin: warm and dry  Assessment & Plan:   Mixed hyperlipidemia History notable for two episodes of rhabdo secondary to statin therapy in which she was discontinued from.  She was on Zetia but this was discontinued due to discomfort from the diarrhea.  She was then switched to Repatha, but patient was hoping to switch to an alternative option today as she is uncomfortable with needles.  She will be started on bempedoic acid 180 mg daily.  I will stop her Repatha.  At her next visit, we will check her lipid panel. - Begin antibiotic acid 100 mg daily - Follow-up lipid panel next visit  Health care maintenance Sent in referral for DEXA scan  Patient discussed with Dr. Susa Raring, MD  [facility] Internal Medicine, PGY-1 Date 07/09/2023 Time 8:50 AM

## 2023-07-08 NOTE — Patient Instructions (Signed)
 Thank you so much for coming to the clinic today!   Follow-up in one month  If you have any questions please feel free to the call the clinic at anytime at [phone]. It was a pleasure seeing you!  Best, Dr. Rayvon Char

## 2023-07-09 NOTE — Assessment & Plan Note (Signed)
 Sent in referral for DEXA scan

## 2023-07-09 NOTE — Progress Notes (Signed)
 Internal Medicine Clinic Attending  Case discussed with the resident at the time of the visit.  We reviewed the resident's history and exam and pertinent patient test results.  I agree with the assessment, diagnosis, and plan of care documented in the resident's note.

## 2023-07-09 NOTE — Assessment & Plan Note (Signed)
 History notable for two episodes of rhabdo secondary to statin therapy in which she was discontinued from.  She was on Zetia but this was discontinued due to discomfort from the diarrhea.  She was then switched to Repatha, but patient was hoping to switch to an alternative option today as she is uncomfortable with needles.  She will be started on bempedoic acid 180 mg daily.  I will stop her Repatha.  At her next visit, we will check her lipid panel. - Begin antibiotic acid 100 mg daily - Follow-up lipid panel next visit

## 2023-07-21 DIAGNOSIS — Z79899 Other long term (current) drug therapy: Secondary | ICD-10-CM | POA: Diagnosis not present

## 2023-07-21 DIAGNOSIS — F17210 Nicotine dependence, cigarettes, uncomplicated: Secondary | ICD-10-CM | POA: Diagnosis not present

## 2023-07-21 DIAGNOSIS — M545 Low back pain, unspecified: Secondary | ICD-10-CM | POA: Diagnosis not present

## 2023-07-21 DIAGNOSIS — M546 Pain in thoracic spine: Secondary | ICD-10-CM | POA: Diagnosis not present

## 2023-07-21 DIAGNOSIS — M542 Cervicalgia: Secondary | ICD-10-CM | POA: Diagnosis not present

## 2023-07-21 DIAGNOSIS — R030 Elevated blood-pressure reading, without diagnosis of hypertension: Secondary | ICD-10-CM | POA: Diagnosis not present

## 2023-07-22 DIAGNOSIS — M5416 Radiculopathy, lumbar region: Secondary | ICD-10-CM | POA: Diagnosis not present

## 2023-07-23 DIAGNOSIS — Z79899 Other long term (current) drug therapy: Secondary | ICD-10-CM | POA: Diagnosis not present

## 2023-07-28 ENCOUNTER — Ambulatory Visit: Payer: Self-pay

## 2023-07-28 DIAGNOSIS — R197 Diarrhea, unspecified: Secondary | ICD-10-CM | POA: Diagnosis not present

## 2023-07-28 NOTE — Telephone Encounter (Signed)
 1st attempt-LVM   Copied from CRM [id number]. Topic: Clinical - Medical Advice >> Jul 28, 2023  2:13 PM Tisa Forester wrote: Reason for CRM:  new prescription request for cough medication that was prescribe before  have a cold , coughing , can not sleeping due to the coughing  patient stated would like the medication that provider gave her before   [id number] - [place], [place] - [address] AT [facility] [address] [place] [address] Phone: [phone] Fax: [phone] Hours: Open 24 hours  callback number [phone]

## 2023-07-28 NOTE — Telephone Encounter (Signed)
  Chief Complaint: Cough Symptoms: Cough, Restlessness  Frequency: Acute, One week Pertinent Negatives: Patient denies dyspnea, chest pain, dizziness, or any other symptoms. Disposition: [] ED /[] Urgent Care (no appt availability in office) / [x] Appointment(In office/virtual)/ []  [facility]/ [] Home Care/ [] Refused Recommended Disposition /[] [facility]/ []  Follow-up with PCP Additional Notes: [facility] is being triaged for an acute productive cough that has been present for a week. The sputum is green in color. The patient denies a fever, and Is requesting promethazine-hydrocodone cough syrup for the cough. Due to telephonic triage assessment, appointment scheduled for tomorrow in office.   Answer Assessment - Initial Assessment Questions 1. ONSET: "When did the cough begin?"      One Week  2. SEVERITY: "How bad is the cough today?"      Continuous  3. SPUTUM: "Describe the color of your sputum" (none, dry cough; clear, white, yellow, green)     Green  4. HEMOPTYSIS: "Are you coughing up any blood?" If so ask: "How much?" (flecks, streaks, tablespoons, etc.)     No  5. DIFFICULTY BREATHING: "Are you having difficulty breathing?" If Yes, ask: "How bad is it?" (e.g., mild, moderate, severe)    - MILD: No SOB at rest, mild SOB with walking, speaks normally in sentences, can lie down, no retractions, pulse < 100.    - MODERATE: SOB at rest, SOB with minimal exertion and prefers to sit, cannot lie down flat, speaks in phrases, mild retractions, audible wheezing, pulse 100-120.    - SEVERE: Very SOB at rest, speaks in single words, struggling to breathe, sitting hunched forward, retractions, pulse > 120      No  6. FEVER: "Do you have a fever?" If Yes, ask: "What is your temperature, how was it measured, and when did it start?"     No  7. CARDIAC HISTORY: "Do you have any history of heart disease?" (e.g., heart attack, congestive heart failure)      No  8. LUNG HISTORY:  "Do you have any history of lung disease?"  (e.g., pulmonary embolus, asthma, emphysema)      No  9. PE RISK FACTORS: "Do you have a history of blood clots?" (or: recent major surgery, recent prolonged travel, bedridden)     No  10. OTHER SYMPTOMS: "Do you have any other symptoms?" (e.g., runny nose, wheezing, chest pain)       No  11. PREGNANCY: "Is there any chance you are pregnant?" "When was your last menstrual period?"       No and No  12. TRAVEL: "Have you traveled out of the country in the last month?" (e.g., travel history, exposures)       No and No  Protocols used: Cough - Acute Productive-A-AH

## 2023-07-29 ENCOUNTER — Encounter: Payer: Self-pay | Admitting: Student

## 2023-07-29 ENCOUNTER — Ambulatory Visit (INDEPENDENT_AMBULATORY_CARE_PROVIDER_SITE_OTHER): Admitting: Student

## 2023-07-29 ENCOUNTER — Other Ambulatory Visit: Payer: Self-pay

## 2023-07-29 VITALS — BP 139/79 | HR 81 | Temp 98.2°F | Ht 69.0 in | Wt 174.1 lb

## 2023-07-29 DIAGNOSIS — J441 Chronic obstructive pulmonary disease with (acute) exacerbation: Secondary | ICD-10-CM

## 2023-07-29 DIAGNOSIS — J9859 Other diseases of mediastinum, not elsewhere classified: Secondary | ICD-10-CM | POA: Diagnosis not present

## 2023-07-29 DIAGNOSIS — J449 Chronic obstructive pulmonary disease, unspecified: Secondary | ICD-10-CM | POA: Insufficient documentation

## 2023-07-29 MED ORDER — BENZONATATE 100 MG PO CAPS: 200.0000 mg | ORAL_CAPSULE | Freq: Two times a day (BID) | ORAL | 1 refills | Status: DC | PRN

## 2023-07-29 MED ORDER — PREDNISONE 20 MG PO TABS: 40.0000 mg | ORAL_TABLET | Freq: Every day | ORAL | 0 refills | Status: AC

## 2023-07-29 MED ORDER — ALBUTEROL SULFATE HFA 108 (90 BASE) MCG/ACT IN AERS: 2.0000 | INHALATION_SPRAY | Freq: Four times a day (QID) | RESPIRATORY_TRACT | 0 refills | Status: DC | PRN

## 2023-07-29 MED ORDER — AZITHROMYCIN 500 MG PO TABS: 500.0000 mg | ORAL_TABLET | Freq: Every day | ORAL | 0 refills | Status: AC

## 2023-07-29 NOTE — Progress Notes (Signed)
 Acute Office Visit  Subjective:     Patient ID: Anne [name], female    DOB: 1956-03-27, 68 y.o.   MRN: [id number]  Chief Complaint  Patient presents with   Cough     Pt stated that she has been having a cough  (productive) for over 1 week now ..... No fever no chills ....     HPI Patient is in today for cough, has a past medical history of hyperlipidemia, Crohn's disease, CAD, on long-term opiates.  ROS    As per assessment and plan Objective:    BP 139/79 (BP Location: Left Arm, Patient Position: Sitting, Cuff Size: Normal)   Pulse 81   Temp 98.2 F (36.8 C) (Oral)   Ht 5\' 9"  (1.753 m)   Wt 174 lb 1.6 oz (79 kg)   LMP 02/18/2001   SpO2 98%   BMI 25.71 kg/m  BP Readings from Last 3 Encounters:  07/29/23 139/79  07/08/23 117/85  06/25/23 136/64   Wt Readings from Last 3 Encounters:  07/29/23 174 lb 1.6 oz (79 kg)  07/08/23 171 lb (77.6 kg)  06/25/23 167 lb 9.6 oz (76 kg)   SpO2 Readings from Last 3 Encounters:  07/29/23 98%  07/08/23 100%  06/25/23 99%      Physical Exam  General: Sitting in chair, no acute distress HENT: AT/[place]/positive for nasal congestion Cardiovascular: Regular rate Pulmonary: Positive for prolonged expiratory wheeze, diffusely Abdomen: Soft, nontender, nondistended MSK: No lower extremity edema     Assessment & Plan:  Patient is discussed with Dr Lafonda Mosses and Dr Sloan Leiter   Problem List Items Addressed This Visit       Respiratory   COPD exacerbation (HCC)   This is a 68 year old female with no known past medical history of COPD or asthma, no PFTs per chart review, presents today for 1 week of acute productive cough, increase in sputum production and changes in sputum color, worsening, also endorses chest congestion, nasal congestion, slight headache.  Denies any shortness of breath.  Has tried TheraFlu and Alka-Seltzer and over-the-counter Mucinex.  Reports that she smokes 4 to 5 cigarettes a day.  Denies any sick contacts.   States that she wants promethazine-codeine for her cough, that she was once prescribed several years ago.  Per physical exam she has diffuse expiratory wheeze.  Per chart review, she is on multiple central acting medications such as gabapentin, Flexeril, Percocet.  I advised the patient that codeine is contraindicated with her centrally acting medications.  Patient reports that this is the only medication that works for her cough.  Patient was notified that she is presenting with a COPD exacerbation, and recommended to follow-up for PFTs as she is currently not on any inhalers.  Patient was frustrated that she did not get codeine.  Patient was advised about tobacco sensation and she was told that we will treat her with antibiotics and steroids.  She appeared displeased.  - Zithromax 500 mg for 3 days - Prednisone 40 mg daily for 5 days - Albuterol inhaler every 6 hours as needed for wheezing - Tessalon Perles 2-3 times a day as needed for cough      Relevant Medications   predniSONE (DELTASONE) 20 MG tablet   azithromycin (ZITHROMAX) 500 MG tablet   benzonatate (TESSALON PERLES) 100 MG capsule   albuterol (VENTOLIN HFA) 108 (90 Base) MCG/ACT inhaler     Other   Mediastinal mass - Primary   Ordered CT chest with contrast 2 months  from now to follow-up on the mediastinal mass.      Relevant Orders   CT Chest W Contrast    Meds ordered this encounter  Medications   predniSONE (DELTASONE) 20 MG tablet    Sig: Take 2 tablets (40 mg total) by mouth daily with breakfast for 5 days.    Dispense:  10 tablet    Refill:  0   azithromycin (ZITHROMAX) 500 MG tablet    Sig: Take 1 tablet (500 mg total) by mouth daily for 3 days. Take 1 tablet daily for 3 days.    Dispense:  3 tablet    Refill:  0   benzonatate (TESSALON PERLES) 100 MG capsule    Sig: Take 2 capsules (200 mg total) by mouth 2 (two) times daily as needed for cough.    Dispense:  30 capsule    Refill:  1   albuterol (VENTOLIN  HFA) 108 (90 Base) MCG/ACT inhaler    Sig: Inhale 2 puffs into the lungs every 6 (six) hours as needed for wheezing or shortness of breath.    Dispense:  6.7 g    Refill:  0    Return in about 2 months (around 09/28/2023) for COPD .  Lanney Pitts, DO

## 2023-07-29 NOTE — Assessment & Plan Note (Signed)
 This is a 68 year old female with no known past medical history of COPD or asthma, no PFTs per chart review, presents today for 1 week of acute productive cough, increase in sputum production and changes in sputum color, worsening, also endorses chest congestion, nasal congestion, slight headache.  Denies any shortness of breath.  Has tried TheraFlu and Alka-Seltzer and over-the-counter Mucinex.  Reports that she smokes 4 to 5 cigarettes a day.  Denies any sick contacts.  States that she wants promethazine-codeine for her cough, that she was once prescribed several years ago.  Per physical exam she has diffuse expiratory wheeze.  Per chart review, she is on multiple central acting medications such as gabapentin, Flexeril, Percocet.  I advised the patient that codeine is contraindicated with her centrally acting medications.  Patient reports that this is the only medication that works for her cough.  Patient was notified that she is presenting with a COPD exacerbation, and recommended to follow-up for PFTs as she is currently not on any inhalers.  Patient was frustrated that she did not get codeine.  Patient was advised about tobacco sensation and she was told that we will treat her with antibiotics and steroids.  She appeared displeased.  - Zithromax 500 mg for 3 days - Prednisone 40 mg daily for 5 days - Albuterol inhaler every 6 hours as needed for wheezing - Tessalon Perles 2-3 times a day as needed for cough

## 2023-07-29 NOTE — Assessment & Plan Note (Signed)
 Ordered CT chest with contrast 2 months from now to follow-up on the mediastinal mass.

## 2023-07-29 NOTE — Progress Notes (Signed)
 Internal Medicine Clinic Attending  Case discussed with the resident at the time of the visit.  We reviewed the resident's history and exam and pertinent patient test results.  I agree with the assessment, diagnosis, and plan of care documented in the resident's note.

## 2023-07-29 NOTE — Patient Instructions (Signed)
 Thank you, Ms.Lonn Roads for allowing us  to provide your care today. Today we discussed:  Start Zithromax, one tablet daily for 3 days Start Prednisone 2 tablets daily for 5 days Take Tessalon two times a day as needed for cough   Albuterol inhaler 2 puffs every 6 hours as needed   CT lung imaging in 2 months   I have ordered the following labs for you:  Lab Orders  No laboratory test(s) ordered today     Tests ordered today:  None   Referrals ordered today:   Referral Orders  No referral(s) requested today     I have ordered the following medication/changed the following medications:   Stop the following medications: There are no discontinued medications.   Start the following medications: No orders of the defined types were placed in this encounter.    Follow up: 2 months for COPD    Remember:   Should you have any questions or concerns please call the internal medicine clinic at [phone].     Lanney Pitts, DO [facility]

## 2023-08-18 DIAGNOSIS — M542 Cervicalgia: Secondary | ICD-10-CM | POA: Diagnosis not present

## 2023-08-18 DIAGNOSIS — Z79899 Other long term (current) drug therapy: Secondary | ICD-10-CM | POA: Diagnosis not present

## 2023-08-18 DIAGNOSIS — R030 Elevated blood-pressure reading, without diagnosis of hypertension: Secondary | ICD-10-CM | POA: Diagnosis not present

## 2023-08-18 DIAGNOSIS — F17210 Nicotine dependence, cigarettes, uncomplicated: Secondary | ICD-10-CM | POA: Diagnosis not present

## 2023-08-18 DIAGNOSIS — N1831 Chronic kidney disease, stage 3a: Secondary | ICD-10-CM | POA: Diagnosis not present

## 2023-08-18 DIAGNOSIS — J449 Chronic obstructive pulmonary disease, unspecified: Secondary | ICD-10-CM | POA: Diagnosis not present

## 2023-08-18 DIAGNOSIS — M546 Pain in thoracic spine: Secondary | ICD-10-CM | POA: Diagnosis not present

## 2023-08-18 DIAGNOSIS — M545 Low back pain, unspecified: Secondary | ICD-10-CM | POA: Diagnosis not present

## 2023-08-20 DIAGNOSIS — Z79899 Other long term (current) drug therapy: Secondary | ICD-10-CM | POA: Diagnosis not present

## 2023-09-17 DIAGNOSIS — F17210 Nicotine dependence, cigarettes, uncomplicated: Secondary | ICD-10-CM | POA: Diagnosis not present

## 2023-09-17 DIAGNOSIS — N1831 Chronic kidney disease, stage 3a: Secondary | ICD-10-CM | POA: Diagnosis not present

## 2023-09-17 DIAGNOSIS — M879 Osteonecrosis, unspecified: Secondary | ICD-10-CM | POA: Diagnosis not present

## 2023-09-17 DIAGNOSIS — M546 Pain in thoracic spine: Secondary | ICD-10-CM | POA: Diagnosis not present

## 2023-09-17 DIAGNOSIS — M545 Low back pain, unspecified: Secondary | ICD-10-CM | POA: Diagnosis not present

## 2023-09-17 DIAGNOSIS — M542 Cervicalgia: Secondary | ICD-10-CM | POA: Diagnosis not present

## 2023-09-17 DIAGNOSIS — R030 Elevated blood-pressure reading, without diagnosis of hypertension: Secondary | ICD-10-CM | POA: Diagnosis not present

## 2023-09-17 DIAGNOSIS — J449 Chronic obstructive pulmonary disease, unspecified: Secondary | ICD-10-CM | POA: Diagnosis not present

## 2023-09-17 DIAGNOSIS — Z79899 Other long term (current) drug therapy: Secondary | ICD-10-CM | POA: Diagnosis not present

## 2023-09-23 DIAGNOSIS — Z79899 Other long term (current) drug therapy: Secondary | ICD-10-CM | POA: Diagnosis not present

## 2023-10-19 DIAGNOSIS — Z79899 Other long term (current) drug therapy: Secondary | ICD-10-CM | POA: Diagnosis not present

## 2023-10-19 DIAGNOSIS — M546 Pain in thoracic spine: Secondary | ICD-10-CM | POA: Diagnosis not present

## 2023-10-19 DIAGNOSIS — M879 Osteonecrosis, unspecified: Secondary | ICD-10-CM | POA: Diagnosis not present

## 2023-10-19 DIAGNOSIS — K5090 Crohn's disease, unspecified, without complications: Secondary | ICD-10-CM | POA: Diagnosis not present

## 2023-10-19 DIAGNOSIS — M542 Cervicalgia: Secondary | ICD-10-CM | POA: Diagnosis not present

## 2023-10-19 DIAGNOSIS — M545 Low back pain, unspecified: Secondary | ICD-10-CM | POA: Diagnosis not present

## 2023-10-19 DIAGNOSIS — R030 Elevated blood-pressure reading, without diagnosis of hypertension: Secondary | ICD-10-CM | POA: Diagnosis not present

## 2023-10-19 DIAGNOSIS — N1831 Chronic kidney disease, stage 3a: Secondary | ICD-10-CM | POA: Diagnosis not present

## 2023-10-19 DIAGNOSIS — J449 Chronic obstructive pulmonary disease, unspecified: Secondary | ICD-10-CM | POA: Diagnosis not present

## 2023-10-19 DIAGNOSIS — F17210 Nicotine dependence, cigarettes, uncomplicated: Secondary | ICD-10-CM | POA: Diagnosis not present

## 2023-10-21 DIAGNOSIS — Z79899 Other long term (current) drug therapy: Secondary | ICD-10-CM | POA: Diagnosis not present

## 2023-11-02 ENCOUNTER — Other Ambulatory Visit: Payer: Self-pay | Admitting: Student

## 2023-11-19 DIAGNOSIS — E559 Vitamin D deficiency, unspecified: Secondary | ICD-10-CM | POA: Diagnosis not present

## 2023-11-19 DIAGNOSIS — Z6824 Body mass index (BMI) 24.0-24.9, adult: Secondary | ICD-10-CM | POA: Diagnosis not present

## 2023-11-19 DIAGNOSIS — M879 Osteonecrosis, unspecified: Secondary | ICD-10-CM | POA: Diagnosis not present

## 2023-11-19 DIAGNOSIS — K5090 Crohn's disease, unspecified, without complications: Secondary | ICD-10-CM | POA: Diagnosis not present

## 2023-11-19 DIAGNOSIS — N1831 Chronic kidney disease, stage 3a: Secondary | ICD-10-CM | POA: Diagnosis not present

## 2023-11-19 DIAGNOSIS — F17210 Nicotine dependence, cigarettes, uncomplicated: Secondary | ICD-10-CM | POA: Diagnosis not present

## 2023-11-19 DIAGNOSIS — M546 Pain in thoracic spine: Secondary | ICD-10-CM | POA: Diagnosis not present

## 2023-11-19 DIAGNOSIS — Z1159 Encounter for screening for other viral diseases: Secondary | ICD-10-CM | POA: Diagnosis not present

## 2023-11-19 DIAGNOSIS — Z79899 Other long term (current) drug therapy: Secondary | ICD-10-CM | POA: Diagnosis not present

## 2023-11-19 DIAGNOSIS — Z7289 Other problems related to lifestyle: Secondary | ICD-10-CM | POA: Diagnosis not present

## 2023-11-19 DIAGNOSIS — Z131 Encounter for screening for diabetes mellitus: Secondary | ICD-10-CM | POA: Diagnosis not present

## 2023-11-19 DIAGNOSIS — M542 Cervicalgia: Secondary | ICD-10-CM | POA: Diagnosis not present

## 2023-11-19 DIAGNOSIS — M129 Arthropathy, unspecified: Secondary | ICD-10-CM | POA: Diagnosis not present

## 2023-11-19 DIAGNOSIS — J449 Chronic obstructive pulmonary disease, unspecified: Secondary | ICD-10-CM | POA: Diagnosis not present

## 2023-11-23 ENCOUNTER — Ambulatory Visit (HOSPITAL_COMMUNITY)
Admission: RE | Admit: 2023-11-23 | Discharge: 2023-11-23 | Disposition: A | Discharge status: Home / Self Care | Source: Ambulatory Visit | Attending: Internal Medicine | Admitting: Internal Medicine

## 2023-11-23 DIAGNOSIS — R918 Other nonspecific abnormal finding of lung field: Secondary | ICD-10-CM | POA: Diagnosis not present

## 2023-11-23 DIAGNOSIS — J9859 Other diseases of mediastinum, not elsewhere classified: Secondary | ICD-10-CM | POA: Insufficient documentation

## 2023-11-23 DIAGNOSIS — R911 Solitary pulmonary nodule: Secondary | ICD-10-CM | POA: Diagnosis not present

## 2023-11-23 MED ORDER — IOHEXOL 300 MG/ML  SOLN
75.0000 mL | Freq: Once | INTRAMUSCULAR | Status: AC | PRN
  Administered 2023-11-23 (×2): 75 mL via INTRAVENOUS

## 2023-11-24 DIAGNOSIS — Z79899 Other long term (current) drug therapy: Secondary | ICD-10-CM | POA: Diagnosis not present

## 2023-11-25 ENCOUNTER — Other Ambulatory Visit: Payer: Self-pay | Admitting: *Deleted

## 2023-11-25 DIAGNOSIS — N2589 Other disorders resulting from impaired renal tubular function: Secondary | ICD-10-CM

## 2023-11-25 DIAGNOSIS — E782 Mixed hyperlipidemia: Secondary | ICD-10-CM

## 2023-11-30 ENCOUNTER — Ambulatory Visit: Payer: Self-pay | Admitting: Student

## 2023-11-30 ENCOUNTER — Other Ambulatory Visit: Payer: Self-pay

## 2023-11-30 DIAGNOSIS — N2589 Other disorders resulting from impaired renal tubular function: Secondary | ICD-10-CM

## 2023-11-30 DIAGNOSIS — E782 Mixed hyperlipidemia: Secondary | ICD-10-CM

## 2023-12-01 ENCOUNTER — Telehealth: Payer: Self-pay | Admitting: *Deleted

## 2023-12-01 NOTE — Telephone Encounter (Signed)
 Will forward to PCP. Copied from CRM #[id number]. Topic: Clinical - Prescription Issue >> Dec 01, 2023 11:23 AM Graeme ORN wrote: Reason for CRM: [facility] called to check status of Rx refill request  Bempedoic Acid  180 mg Oral Daily  Sodium Bicarbonate  1,950 mg Oral   Advised pending   Thank You

## 2023-12-02 ENCOUNTER — Encounter

## 2023-12-08 MED ORDER — BEMPEDOIC ACID 180 MG PO TABS: 180.0000 mg | ORAL_TABLET | Freq: Every day | ORAL | 3 refills | Status: AC

## 2023-12-08 MED ORDER — SODIUM BICARBONATE 650 MG PO TABS: 1950.0000 mg | ORAL_TABLET | Freq: Two times a day (BID) | ORAL | 5 refills | Status: AC

## 2023-12-09 ENCOUNTER — Encounter: Payer: Self-pay | Admitting: Student

## 2023-12-09 ENCOUNTER — Telehealth: Payer: Self-pay

## 2023-12-09 ENCOUNTER — Ambulatory Visit: Admitting: Student

## 2023-12-09 VITALS — BP 118/76 | HR 74 | Temp 97.7°F | Ht 69.0 in | Wt 174.4 lb

## 2023-12-09 DIAGNOSIS — K50119 Crohn's disease of large intestine with unspecified complications: Secondary | ICD-10-CM | POA: Diagnosis not present

## 2023-12-09 DIAGNOSIS — J9859 Other diseases of mediastinum, not elsewhere classified: Secondary | ICD-10-CM

## 2023-12-09 DIAGNOSIS — N2589 Other disorders resulting from impaired renal tubular function: Secondary | ICD-10-CM

## 2023-12-09 DIAGNOSIS — M129 Arthropathy, unspecified: Secondary | ICD-10-CM | POA: Diagnosis not present

## 2023-12-09 DIAGNOSIS — N1832 Chronic kidney disease, stage 3b: Secondary | ICD-10-CM

## 2023-12-09 DIAGNOSIS — Z131 Encounter for screening for diabetes mellitus: Secondary | ICD-10-CM | POA: Diagnosis not present

## 2023-12-09 DIAGNOSIS — J449 Chronic obstructive pulmonary disease, unspecified: Secondary | ICD-10-CM

## 2023-12-09 DIAGNOSIS — M48062 Spinal stenosis, lumbar region with neurogenic claudication: Secondary | ICD-10-CM

## 2023-12-09 DIAGNOSIS — Z79899 Other long term (current) drug therapy: Secondary | ICD-10-CM | POA: Diagnosis not present

## 2023-12-09 DIAGNOSIS — E782 Mixed hyperlipidemia: Secondary | ICD-10-CM

## 2023-12-09 DIAGNOSIS — E559 Vitamin D deficiency, unspecified: Secondary | ICD-10-CM | POA: Diagnosis not present

## 2023-12-09 DIAGNOSIS — Z1159 Encounter for screening for other viral diseases: Secondary | ICD-10-CM | POA: Diagnosis not present

## 2023-12-09 MED ORDER — ALBUTEROL SULFATE HFA 108 (90 BASE) MCG/ACT IN AERS: 2.0000 | INHALATION_SPRAY | Freq: Four times a day (QID) | RESPIRATORY_TRACT | 0 refills | Status: AC | PRN

## 2023-12-09 MED ORDER — GABAPENTIN 400 MG PO CAPS: 400.0000 mg | ORAL_CAPSULE | Freq: Two times a day (BID) | ORAL | 3 refills | Status: AC

## 2023-12-09 NOTE — Progress Notes (Signed)
 Internal Medicine Clinic Attending  Case discussed with the resident at the time of the visit.  We reviewed the resident's history and exam and pertinent patient test results.  I agree with the assessment, diagnosis, and plan of care documented in the resident's note.

## 2023-12-09 NOTE — Assessment & Plan Note (Addendum)
 Patient was previously on potassium supplements in addition to her sodium bicarbonate .  Patient is currently only on sodium bicarbonate  1,950 mg BID.  She does not follow with a nephrologist. Plan: - CMP ordered for today - Patient prefers that her labs are collected by [facility], patient was provided laboratory orders to obtain lab work through [facility].

## 2023-12-09 NOTE — Assessment & Plan Note (Signed)
 Follows with Dr. Rollin with GI.  Currently well-controlled on Humira.  Was recently started on Colestid 2 grams twice a day.  Patient has an appointment with GI in September.

## 2023-12-09 NOTE — Assessment & Plan Note (Signed)
 Patient has a history of hyperlipidemia currently on a regimen of bempedoic acid  180mg  daily.  Patient has a history of a STEMI in the right RCA due to a thrombotic event.  With that being said, would prefer if patient's LDL to be less than 70 however patient has very limited options on cholesterol control.  Patient is unable to use statins due to rhabdomyolysis requiring hospitalization, unable to use Zetia  due to side effect of diarrhea, and is hesitant to use Repatha  due to fear of needles. Plan: - Lipid profile today -Will continue with bempedoic acid  180 mg daily

## 2023-12-09 NOTE — Progress Notes (Addendum)
 Established Patient Office Visit  Subjective   Patient ID: Anne [name], female    DOB: 19-Jan-1956  Age: 68 y.o. MRN: [id number]  Chief Complaint  Patient presents with   routine checkup   Medication Refill    Anne [name] is a 68 y.o. who presents to the clinic for hyperlipidemia, suspected COPD, mediastinal mass, type I RTA, Crohn's disease. Please see problem based assessment and plan for additional details.   Patient Active Problem List   Diagnosis Date Noted   COPD (chronic obstructive pulmonary disease) (HCC) 07/29/2023   Mediastinal mass 12/28/2022   Tobacco abuse 12/28/2022   Chronic low blood pressure 11/26/2022   Back pain 11/26/2022   Encounter for smoking cessation counseling 11/26/2022   Health care maintenance 11/26/2022   Stage 3b chronic kidney disease (HCC) 10/09/2022   S/P lumbar laminectomy 06/05/2022   Spinal stenosis of lumbar region with neurogenic claudication 05/22/2022   S/P cervical spinal fusion 01/02/2022   Mixed hyperlipidemia 02/02/2020   Crohn's disease of colon with complication (HCC) 02/01/2020   Chronic diarrhea 02/01/2020   Coronary artery disease involving native coronary artery of native heart without angina pectoris 02/01/2020   Severe protein-calorie malnutrition (HCC) 08/08/2019   Renal tubular acidosis type I 08/04/2019   Generalized weakness    Prolonged Q-T interval on ECG    STEMI involving right coronary artery (HCC) 10/26/2017     Objective:     BP 118/76 (BP Location: Right Arm, Patient Position: Sitting, Cuff Size: Normal)   Pulse 74   Temp 97.7 F (36.5 C) (Oral)   Ht 5' 9 (1.753 m)   Wt 174 lb 6.4 oz (79.1 kg)   LMP 02/18/2001   SpO2 99%   BMI 25.75 kg/m  BP Readings from Last 3 Encounters:  12/09/23 118/76  07/29/23 139/79  07/08/23 117/85   Wt Readings from Last 3 Encounters:  12/09/23 174 lb 6.4 oz (79.1 kg)  07/29/23 174 lb 1.6 oz (79 kg)  07/08/23 171 lb (77.6 kg)      Physical Exam Vitals  reviewed.  Constitutional:      General: She is not in acute distress.    Appearance: She is not toxic-appearing.  Cardiovascular:     Rate and Rhythm: Normal rate and regular rhythm.     Heart sounds: No murmur heard. Pulmonary:     Effort: Pulmonary effort is normal. No respiratory distress.     Breath sounds: Normal breath sounds. No wheezing.  Musculoskeletal:     Right lower leg: No edema.     Left lower leg: No edema.  Skin:    General: Skin is warm and dry.  Neurological:     Mental Status: She is alert.  Psychiatric:        Mood and Affect: Mood and affect normal.    Last metabolic panel Lab Results  Component Value Date   GLUCOSE 83 03/01/2023   NA 141 03/01/2023   K 4.1 03/01/2023   CL 105 03/01/2023   CO2 27 03/01/2023   BUN 13 03/01/2023   CREATININE 1.02 03/01/2023   EGFR 47.0 05/27/2023   CALCIUM  8.3 (L) 03/01/2023   PHOS 2.5 12/18/2022   PROT 6.3 03/01/2023   ALBUMIN  2.5 (L) 12/23/2022   BILITOT 0.5 03/01/2023   ALKPHOS 96 12/23/2022   AST 19 03/01/2023   ALT 32 (H) 03/01/2023   ANIONGAP 9 12/23/2022   Last lipids Lab Results  Component Value Date   CHOL 145 03/01/2023  HDL 55 03/01/2023   LDLCALC 73 03/01/2023   TRIG 85 03/01/2023   CHOLHDL 2.6 03/01/2023      The ASCVD Risk score (Arnett DK, et al., 2019) failed to calculate for the following reasons:   Risk score cannot be calculated because patient has a medical history suggesting prior/existing ASCVD    Assessment & Plan:   Problem List Items Addressed This Visit       Respiratory   COPD (chronic obstructive pulmonary disease) (HCC) - Primary   Patient was previously treated for a COPD exacerbation in April 2025.  Patient does not have documented history of COPD or asthma.  Unfortunately there were no PFTs recorded in the chart.  With her smoking history and shortness of breath with exertion, I do have a high suspicion for COPD at this time.  We were unable to perform an office  spirometry today.  She denies symptoms of COPD exacerbation today.  Physical exam did not reveal signs of exacerbation today, her lungs were clear to auscultate bilaterally and her SpO2 was recorded at 99%.   Plan: - Continue albuterol  as needed until PFTs are performed -PFTs ordered -Once PFTs are recorded, based on patient's symptoms she is Gold group A and would benefit from a LAMA plus as needed SABA      Relevant Medications   albuterol  (VENTOLIN  HFA) 108 (90 Base) MCG/ACT inhaler   Other Relevant Orders   Pulmonary function test     Digestive   Crohn's disease of colon with complication (HCC)   Follows with Dr. Rollin with GI.  Currently well-controlled on Humira.  Was recently started on Colestid 2 grams twice a day.  Patient has an appointment with GI in September.        Genitourinary   Stage 3b chronic kidney disease (HCC)   Renal tubular acidosis type I   Patient was previously on potassium supplements in addition to her sodium bicarbonate .  Patient is currently only on sodium bicarbonate  1,950 mg BID.  She does not follow with a nephrologist. Plan: - CMP ordered for today - Patient prefers that her labs are collected by [facility], patient was provided laboratory orders to obtain lab work through [facility].        Other   Mixed hyperlipidemia   Patient has a history of hyperlipidemia currently on a regimen of bempedoic acid  180mg  daily.  Patient has a history of a STEMI in the right RCA due to a thrombotic event.  With that being said, would prefer if patient's LDL to be less than 70 however patient has very limited options on cholesterol control.  Patient is unable to use statins due to rhabdomyolysis requiring hospitalization, unable to use Zetia  due to side effect of diarrhea, and is hesitant to use Repatha  due to fear of needles. Plan: - Lipid profile today -Will continue with bempedoic acid  180 mg daily      Relevant Medications   colestipol (COLESTID) 1 g tablet    Other Relevant Orders   Lipid Profile   Spinal stenosis of lumbar region with neurogenic claudication   Relevant Medications   gabapentin  (NEURONTIN ) 400 MG capsule   Mediastinal mass   Known history of mediastinal mass.  CT scan in August 2025 reported 11 x 9 mm previously 13 x 9 mm mass present.  Patient will be due for yearly CT scans.        ADDENDUM:   Patient's laboratory work from [facility] [facility] was faxed to the office with  the following results:  -Cr 1.11, K 4.0, AST 25, ALT 19  Patient is not taking potassium supplements and reports not taking any since January.  Plan: -Will defer potassium supplementation for now, repeat CMP in 3 months or sooner if patient has myalgias/cramping -CMP order removed -Patient still needs to have cholesterol checked   Return in about 6 months (around 06/10/2024) for Chronic conditions.    Damien Lease, DO

## 2023-12-09 NOTE — Patient Instructions (Signed)
 Thank you, Ms.Jonette JONETTA Ada for allowing us  to provide your care today. Today we discussed your kidney disease, cholesterol, and possible COPD. Please get the lung function testing done before we send in the inhalers .    I have ordered the following labs for you:   Lab Orders         Lipid Profile         Comprehensive metabolic panel with GFR        I have ordered the following medication/changed the following medications:   Stop the following medications: Medications Discontinued During This Encounter  Medication Reason   benzonatate  (TESSALON  PERLES) 100 MG capsule    gabapentin  (NEURONTIN ) 400 MG capsule Reorder   albuterol  (VENTOLIN  HFA) 108 (90 Base) MCG/ACT inhaler Reorder   loperamide (IMODIUM A-D) 2 MG tablet    midodrine  (PROAMATINE ) 5 MG tablet    Potassium Gluconate 550 (90 K) MG TABS      Start the following medications: Meds ordered this encounter  Medications   albuterol  (VENTOLIN  HFA) 108 (90 Base) MCG/ACT inhaler    Sig: Inhale 2 puffs into the lungs every 6 (six) hours as needed for wheezing or shortness of breath.    Dispense:  6.7 g    Refill:  0   gabapentin  (NEURONTIN ) 400 MG capsule    Sig: Take 1 capsule (400 mg total) by mouth 2 (two) times daily.    Dispense:  180 capsule    Refill:  3     Follow up: 6 months   Should you have any questions or concerns please call the internal medicine clinic at [phone].     Please note that our late policy has changed.  If you are more than 15 minutes late to your appointment, you may be asked to reschedule your appointment.  Dr. Kandis, D.O. [facility]

## 2023-12-09 NOTE — Assessment & Plan Note (Signed)
 Patient was previously treated for a COPD exacerbation in April 2025.  Patient does not have documented history of COPD or asthma.  Unfortunately there were no PFTs recorded in the chart.  With her smoking history and shortness of breath with exertion, I do have a high suspicion for COPD at this time.  We were unable to perform an office spirometry today.  She denies symptoms of COPD exacerbation today.  Physical exam did not reveal signs of exacerbation today, her lungs were clear to auscultate bilaterally and her SpO2 was recorded at 99%.   Plan: - Continue albuterol  as needed until PFTs are performed -PFTs ordered -Once PFTs are recorded, based on patient's symptoms she is Gold group A and would benefit from a LAMA plus as needed SABA

## 2023-12-09 NOTE — Telephone Encounter (Signed)
   Pre-operative Risk Assessment    Patient Name: Anne [name]  DOB: 16-Jul-1955 MRN: [id number]   Date of last office visit: 06/25/23 ROSABEL MOSE, NP Date of next office visit: NONE  Request for Surgical Clearance    Procedure:  Dental Extraction - Amount of Teeth to be Pulled:  2 TEETH EXTRACTION  Date of Surgery:  Clearance TBD                                Surgeon:  ROGELIA EPPS, DDS, MD Surgeon's Group or Practice Name:  [facility]  Phone number:  [phone] Fax number:  [phone]   Type of Clearance Requested:   - Medical  - Pharmacy:  Hold Aspirin      Type of Anesthesia:  INTRAVENOUS ANESTHESIA   Additional requests/questions:    SignedLucie DELENA Ku   12/09/2023, 5:14 PM

## 2023-12-09 NOTE — Assessment & Plan Note (Signed)
 Known history of mediastinal mass.  CT scan in August 2025 reported 11 x 9 mm previously 13 x 9 mm mass present.  Patient will be due for yearly CT scans.

## 2023-12-10 NOTE — Telephone Encounter (Signed)
   Patient Name: Anne [name]  DOB: 1955/08/02 MRN: [id number]  Primary Cardiologist: Redell Shallow, MD  Chart reviewed as part of pre-operative protocol coverage.   Simple dental extractions (i.e. 1-2 teeth) are considered low risk procedures per guidelines and generally do not require any specific cardiac clearance. It is also generally accepted that for simple extractions and dental cleanings, there is no need to interrupt blood thinner therapy.   SBE prophylaxis is not required for the patient from a cardiac standpoint.  I will route this recommendation to the requesting party via Epic fax function and remove from pre-op pool.  Please call with questions.  Wyn Raddle, Jackee Shove, NP 12/10/2023, 6:57 AM

## 2023-12-14 LAB — LAB REPORT - SCANNED
A1c: 5.4
EGFR: 53
HM Hepatitis Screen: NEGATIVE

## 2023-12-15 DIAGNOSIS — Z79899 Other long term (current) drug therapy: Secondary | ICD-10-CM | POA: Diagnosis not present

## 2023-12-15 DIAGNOSIS — F17210 Nicotine dependence, cigarettes, uncomplicated: Secondary | ICD-10-CM | POA: Diagnosis not present

## 2023-12-15 DIAGNOSIS — N1831 Chronic kidney disease, stage 3a: Secondary | ICD-10-CM | POA: Diagnosis not present

## 2023-12-15 DIAGNOSIS — M542 Cervicalgia: Secondary | ICD-10-CM | POA: Diagnosis not present

## 2023-12-15 DIAGNOSIS — M879 Osteonecrosis, unspecified: Secondary | ICD-10-CM | POA: Diagnosis not present

## 2023-12-15 DIAGNOSIS — E663 Overweight: Secondary | ICD-10-CM | POA: Diagnosis not present

## 2023-12-15 DIAGNOSIS — M546 Pain in thoracic spine: Secondary | ICD-10-CM | POA: Diagnosis not present

## 2023-12-15 DIAGNOSIS — Z9181 History of falling: Secondary | ICD-10-CM | POA: Diagnosis not present

## 2023-12-15 DIAGNOSIS — J449 Chronic obstructive pulmonary disease, unspecified: Secondary | ICD-10-CM | POA: Diagnosis not present

## 2023-12-15 DIAGNOSIS — K5090 Crohn's disease, unspecified, without complications: Secondary | ICD-10-CM | POA: Diagnosis not present

## 2023-12-15 DIAGNOSIS — E559 Vitamin D deficiency, unspecified: Secondary | ICD-10-CM | POA: Diagnosis not present

## 2023-12-17 DIAGNOSIS — Z79899 Other long term (current) drug therapy: Secondary | ICD-10-CM | POA: Diagnosis not present

## 2023-12-20 NOTE — Addendum Note (Signed)
 Addended by: KANDIS PERKINS on: 12/20/2023 01:49 PM   Modules accepted: Orders

## 2023-12-24 ENCOUNTER — Encounter: Payer: Self-pay | Admitting: Cardiology

## 2024-01-06 ENCOUNTER — Telehealth: Payer: Self-pay | Admitting: *Deleted

## 2024-01-06 NOTE — Telephone Encounter (Signed)
 Copied from CRM [id number]. Topic: Clinical - Medication Question >> Jan 06, 2024  9:19 AM Miquel SAILOR wrote: Reason for CRM: Patient making sure on location for pharmacy updated    [facility] [id number] - [place], [place] - [address] [place] [address] Phone: [phone] Fax: [phone] Hours: Not open 24 hours

## 2024-01-06 NOTE — Telephone Encounter (Signed)
 Done

## 2024-01-11 NOTE — Telephone Encounter (Signed)
 Spoke with the patient who is now awre no appointments are ava for Dr. Kandis at this time. The patient is not due for an appt until 05/2024 per her LOV.  Copied from CRM #[id number]. Topic: Appointments - Scheduling Inquiry for Clinic >> Jan 06, 2024  9:24 AM Miquel SAILOR wrote: Reason for CRM: Patient requesting call back on app for follow up from 08/28.Only wants Anne [name]. No dates available. Needs call back [phone]

## 2024-01-18 DIAGNOSIS — M545 Low back pain, unspecified: Secondary | ICD-10-CM | POA: Diagnosis not present

## 2024-01-18 DIAGNOSIS — M542 Cervicalgia: Secondary | ICD-10-CM | POA: Diagnosis not present

## 2024-01-18 DIAGNOSIS — Z79899 Other long term (current) drug therapy: Secondary | ICD-10-CM | POA: Diagnosis not present

## 2024-01-18 DIAGNOSIS — E559 Vitamin D deficiency, unspecified: Secondary | ICD-10-CM | POA: Diagnosis not present

## 2024-01-18 DIAGNOSIS — M879 Osteonecrosis, unspecified: Secondary | ICD-10-CM | POA: Diagnosis not present

## 2024-01-18 DIAGNOSIS — Z9181 History of falling: Secondary | ICD-10-CM | POA: Diagnosis not present

## 2024-01-18 DIAGNOSIS — N1831 Chronic kidney disease, stage 3a: Secondary | ICD-10-CM | POA: Diagnosis not present

## 2024-01-18 DIAGNOSIS — F17210 Nicotine dependence, cigarettes, uncomplicated: Secondary | ICD-10-CM | POA: Diagnosis not present

## 2024-01-18 DIAGNOSIS — J449 Chronic obstructive pulmonary disease, unspecified: Secondary | ICD-10-CM | POA: Diagnosis not present

## 2024-01-18 DIAGNOSIS — E663 Overweight: Secondary | ICD-10-CM | POA: Diagnosis not present

## 2024-01-18 DIAGNOSIS — K5090 Crohn's disease, unspecified, without complications: Secondary | ICD-10-CM | POA: Diagnosis not present

## 2024-01-20 DIAGNOSIS — Z79899 Other long term (current) drug therapy: Secondary | ICD-10-CM | POA: Diagnosis not present

## 2024-02-17 DIAGNOSIS — F17210 Nicotine dependence, cigarettes, uncomplicated: Secondary | ICD-10-CM | POA: Diagnosis not present

## 2024-02-17 DIAGNOSIS — M542 Cervicalgia: Secondary | ICD-10-CM | POA: Diagnosis not present

## 2024-02-17 DIAGNOSIS — N1831 Chronic kidney disease, stage 3a: Secondary | ICD-10-CM | POA: Diagnosis not present

## 2024-02-17 DIAGNOSIS — M545 Low back pain, unspecified: Secondary | ICD-10-CM | POA: Diagnosis not present

## 2024-02-17 DIAGNOSIS — Z79899 Other long term (current) drug therapy: Secondary | ICD-10-CM | POA: Diagnosis not present

## 2024-02-17 DIAGNOSIS — J449 Chronic obstructive pulmonary disease, unspecified: Secondary | ICD-10-CM | POA: Diagnosis not present

## 2024-02-17 DIAGNOSIS — E663 Overweight: Secondary | ICD-10-CM | POA: Diagnosis not present

## 2024-02-17 DIAGNOSIS — M879 Osteonecrosis, unspecified: Secondary | ICD-10-CM | POA: Diagnosis not present

## 2024-02-17 DIAGNOSIS — Z9181 History of falling: Secondary | ICD-10-CM | POA: Diagnosis not present

## 2024-02-17 DIAGNOSIS — E559 Vitamin D deficiency, unspecified: Secondary | ICD-10-CM | POA: Diagnosis not present

## 2024-02-17 DIAGNOSIS — K5090 Crohn's disease, unspecified, without complications: Secondary | ICD-10-CM | POA: Diagnosis not present

## 2024-02-21 DIAGNOSIS — Z79899 Other long term (current) drug therapy: Secondary | ICD-10-CM | POA: Diagnosis not present

## 2024-03-16 DIAGNOSIS — F17210 Nicotine dependence, cigarettes, uncomplicated: Secondary | ICD-10-CM | POA: Diagnosis not present

## 2024-03-16 DIAGNOSIS — E663 Overweight: Secondary | ICD-10-CM | POA: Diagnosis not present

## 2024-03-16 DIAGNOSIS — M879 Osteonecrosis, unspecified: Secondary | ICD-10-CM | POA: Diagnosis not present

## 2024-03-16 DIAGNOSIS — M542 Cervicalgia: Secondary | ICD-10-CM | POA: Diagnosis not present

## 2024-03-16 DIAGNOSIS — M545 Low back pain, unspecified: Secondary | ICD-10-CM | POA: Diagnosis not present

## 2024-03-16 DIAGNOSIS — Z9181 History of falling: Secondary | ICD-10-CM | POA: Diagnosis not present

## 2024-03-16 DIAGNOSIS — Z79899 Other long term (current) drug therapy: Secondary | ICD-10-CM | POA: Diagnosis not present

## 2024-03-20 DIAGNOSIS — Z79899 Other long term (current) drug therapy: Secondary | ICD-10-CM | POA: Diagnosis not present

## 2024-05-05 ENCOUNTER — Ambulatory Visit: Payer: Self-pay | Admitting: Student

## 2024-05-05 ENCOUNTER — Encounter: Payer: Self-pay | Admitting: Student

## 2024-05-05 VITALS — BP 127/84 | HR 63 | Temp 97.5°F | Ht 69.0 in | Wt 173.2 lb

## 2024-05-05 DIAGNOSIS — N1832 Chronic kidney disease, stage 3b: Secondary | ICD-10-CM

## 2024-05-05 DIAGNOSIS — F17210 Nicotine dependence, cigarettes, uncomplicated: Secondary | ICD-10-CM | POA: Diagnosis not present

## 2024-05-05 DIAGNOSIS — N2589 Other disorders resulting from impaired renal tubular function: Secondary | ICD-10-CM

## 2024-05-05 DIAGNOSIS — Z0000 Encounter for general adult medical examination without abnormal findings: Secondary | ICD-10-CM

## 2024-05-05 DIAGNOSIS — E559 Vitamin D deficiency, unspecified: Secondary | ICD-10-CM | POA: Diagnosis not present

## 2024-05-05 DIAGNOSIS — Z720 Tobacco use: Secondary | ICD-10-CM

## 2024-05-05 DIAGNOSIS — J449 Chronic obstructive pulmonary disease, unspecified: Secondary | ICD-10-CM

## 2024-05-05 DIAGNOSIS — Z780 Asymptomatic menopausal state: Secondary | ICD-10-CM

## 2024-05-05 DIAGNOSIS — Z13820 Encounter for screening for osteoporosis: Secondary | ICD-10-CM

## 2024-05-05 DIAGNOSIS — M48062 Spinal stenosis, lumbar region with neurogenic claudication: Secondary | ICD-10-CM | POA: Diagnosis not present

## 2024-05-05 DIAGNOSIS — E782 Mixed hyperlipidemia: Secondary | ICD-10-CM | POA: Diagnosis not present

## 2024-05-05 MED ORDER — INCRUSE ELLIPTA 62.5 MCG/ACT IN AEPB: 1.0000 | INHALATION_SPRAY | Freq: Every day | RESPIRATORY_TRACT | 3 refills | Status: AC

## 2024-05-05 NOTE — Progress Notes (Addendum)
 "  Established Patient Office Visit  Subjective   Patient ID: Anne [name], female    DOB: 1955/11/09  Age: 69 y.o. MRN: [id number]  Chief Complaint  Patient presents with   Medical Management of Chronic Issues    Check-up. Bone density.    Anne [name] is a 69 y.o. who presents to the clinic for RTA type 1, TAD, ?COPD, need for DEXA scan. Please see problem based assessment and plan for additional details.    Patient Active Problem List   Diagnosis Date Noted   Vitamin D  deficiency 05/05/2024   COPD (chronic obstructive pulmonary disease) (HCC) 07/29/2023   Mediastinal mass 12/28/2022   Tobacco abuse 12/28/2022   Chronic low blood pressure 11/26/2022   Back pain 11/26/2022   Encounter for smoking cessation counseling 11/26/2022   Health care maintenance 11/26/2022   Stage 3b chronic kidney disease (HCC) 10/09/2022   S/P lumbar laminectomy 06/05/2022   Spinal stenosis of lumbar region with neurogenic claudication 05/22/2022   S/P cervical spinal fusion 01/02/2022   Mixed hyperlipidemia 02/02/2020   Crohn's disease of colon with complication (HCC) 02/01/2020   Coronary artery disease involving native coronary artery of native heart without angina pectoris 02/01/2020   Severe protein-calorie malnutrition 08/08/2019   Renal tubular acidosis type I 08/04/2019   Generalized weakness    Prolonged Q-T interval on ECG    STEMI involving right coronary artery (HCC) 10/26/2017      Objective:     BP 127/84 (BP Location: Left Arm, Patient Position: Sitting, Cuff Size: Normal)   Pulse 63   Temp (!) 97.5 F (36.4 C) (Oral)   Ht 5' 9 (1.753 m)   Wt 173 lb 3.2 oz (78.6 kg)   LMP 02/18/2001   SpO2 95% Comment: RA  BMI 25.58 kg/m  BP Readings from Last 3 Encounters:  05/05/24 127/84  12/09/23 118/76  07/29/23 139/79   Wt Readings from Last 3 Encounters:  05/05/24 173 lb 3.2 oz (78.6 kg)  12/09/23 174 lb 6.4 oz (79.1 kg)  07/29/23 174 lb 1.6 oz (79 kg)      Physical  Exam Vitals reviewed.  Constitutional:      General: She is not in acute distress.    Appearance: She is not ill-appearing, toxic-appearing or diaphoretic.  Cardiovascular:     Rate and Rhythm: Normal rate and regular rhythm.  Pulmonary:     Effort: Pulmonary effort is normal. No respiratory distress.     Breath sounds: Normal breath sounds. No wheezing.  Skin:    General: Skin is warm and dry.  Neurological:     Mental Status: She is alert.  Psychiatric:        Mood and Affect: Mood and affect normal.      No results found for any visits on 05/05/24.  Last metabolic panel Lab Results  Component Value Date   GLUCOSE 83 03/01/2023   NA 141 03/01/2023   K 4.1 03/01/2023   CL 105 03/01/2023   CO2 27 03/01/2023   BUN 13 03/01/2023   CREATININE 1.02 03/01/2023   EGFR 53.0 12/14/2023   CALCIUM  8.3 (L) 03/01/2023   PHOS 2.5 12/18/2022   PROT 6.3 03/01/2023   ALBUMIN  2.5 (L) 12/23/2022   BILITOT 0.5 03/01/2023   ALKPHOS 96 12/23/2022   AST 19 03/01/2023   ALT 32 (H) 03/01/2023   ANIONGAP 9 12/23/2022   Last lipids Lab Results  Component Value Date   CHOL 145 03/01/2023   HDL 55  03/01/2023   LDLCALC 73 03/01/2023   TRIG 85 03/01/2023   CHOLHDL 2.6 03/01/2023      The ASCVD Risk score (Arnett DK, et al., 2019) failed to calculate for the following reasons:   Risk score cannot be calculated because patient has a medical history suggesting prior/existing ASCVD   * - Cholesterol units were assumed    Assessment & Plan:   Problem List Items Addressed This Visit       Respiratory   COPD (chronic obstructive pulmonary disease) (HCC)   Suspected COPD. PFTs ordered last fall and patient has not completed them. She uses an albuterol  inhaler prn at this time. She has DOE but denies SOB at rest or chest pain with exertion. She denies new coughs but does report wheezing every now and then. Unable to complete in office spirometry today.   Plan: -Start LAMA -Continue prn  albuterol  -Needs PFTs completed  -Smoking cessation encouraged, but declined       Relevant Medications   umeclidinium bromide  (INCRUSE ELLIPTA ) 62.5 MCG/ACT AEPB     Genitourinary   Renal tubular acidosis type I - Primary   Continues to take sodium bicarb 1,950mg  BID. Nephrology referral sent and BMP ordered      Relevant Orders   Comprehensive metabolic panel with GFR   Stage 3b chronic kidney disease (HCC)   Relevant Orders   Ambulatory referral to Nephrology     Other   Health care maintenance   DEXA scan ordered      Tobacco abuse   She reports smoking less than 1/2 pack per day. She does not want to quit smoking.       Vitamin D  deficiency   Patient reports history of vitamin D  deficiency and stated that she takes vitamin D  once per week. She started therapy in November Plan: -Recheck vitamin D  today      Relevant Orders   Vitamin D  (25 hydroxy)   Mixed hyperlipidemia   Relevant Orders   Lipid Profile   Spinal stenosis of lumbar region with neurogenic claudication   Other Visit Diagnoses       Osteoporosis screening       Relevant Orders   DG Bone Density     Encounter for osteoporosis screening in asymptomatic postmenopausal patient       Relevant Orders   DG Bone Density     RTA (renal tubular acidosis)       Relevant Orders   Ambulatory referral to Nephrology       Return in about 6 months (around 11/02/2024) for chronic conditions .    Damien Lease, DO  "

## 2024-05-05 NOTE — Assessment & Plan Note (Signed)
 Patient reports history of vitamin D  deficiency and stated that she takes vitamin D  once per week. She started therapy in November Plan: -Recheck vitamin D  today

## 2024-05-05 NOTE — Assessment & Plan Note (Signed)
 She reports smoking less than 1/2 pack per day. She does not want to quit smoking.

## 2024-05-05 NOTE — Assessment & Plan Note (Signed)
 Suspected COPD. PFTs ordered last fall and patient has not completed them. She uses an albuterol  inhaler prn at this time. She has DOE but denies SOB at rest or chest pain with exertion. She denies new coughs but does report wheezing every now and then. Unable to complete in office spirometry today.   Plan: -Start LAMA -Continue prn albuterol  -Needs PFTs completed  -Smoking cessation encouraged, but declined

## 2024-05-05 NOTE — Patient Instructions (Signed)
 Thank you, Anne [name] for allowing us  to provide your care today. Today we discussed lab testing.  We will send you to a kidney doctor. We ordered the bone density scan. We will call you with results.  I will ask our office to send a referral for the lung testing   I have ordered the following labs for you:   Lab Orders         Lipid Profile         Comprehensive metabolic panel with GFR         Vitamin D  (25 hydroxy)        Referrals ordered today:    Referral Orders         Ambulatory referral to Nephrology       Follow up: 6 months or sooner if needed   Remember:   Should you have any questions or concerns please call the internal medicine clinic at [phone].     Please note that our late policy has changed.  If you are more than 15 minutes late to your appointment, you may be asked to reschedule your appointment.  Dr. Kandis, D.O. [facility]

## 2024-05-05 NOTE — Assessment & Plan Note (Signed)
 DEXA scan  ordered.

## 2024-05-05 NOTE — Assessment & Plan Note (Addendum)
 Continues to take sodium bicarb 1,950mg  BID. Nephrology referral sent and BMP ordered

## 2024-05-13 NOTE — Progress Notes (Signed)
 Internal Medicine Clinic Attending  Case discussed with the resident at the time of the visit.  We reviewed the resident's history and exam and pertinent patient test results.  I agree with the assessment, diagnosis, and plan of care documented in the resident's note.

## 2024-05-17 ENCOUNTER — Telehealth (HOSPITAL_BASED_OUTPATIENT_CLINIC_OR_DEPARTMENT_OTHER): Payer: Self-pay | Admitting: *Deleted

## 2024-05-17 NOTE — Telephone Encounter (Signed)
"  ° °  Pre-operative Risk Assessment    Patient Name: Anne [name]  DOB: 02/11/1956 MRN: [id number]   Date of last office visit: 06/25/23 ROSABEL MOSE, NP Date of next office visit: NONE   Request for Surgical Clearance    Procedure:  COLONOSCOPY   Date of Surgery:  Clearance 06/23/24                                Surgeon:  DR. HUNG Surgeon's Group or Practice Name:  [facility] Phone number:  [phone] Fax number:  [phone]   Type of Clearance Requested:   - Medical    Type of Anesthesia:  PROPOFOL    Additional requests/questions:    Bonney Niels Jest   05/17/2024, 4:39 PM   "

## 2024-05-18 ENCOUNTER — Ambulatory Visit (HOSPITAL_COMMUNITY)
Admission: RE | Admit: 2024-05-18 | Discharge: 2024-05-18 | Disposition: A | Source: Ambulatory Visit | Attending: Internal Medicine

## 2024-05-18 DIAGNOSIS — J449 Chronic obstructive pulmonary disease, unspecified: Secondary | ICD-10-CM

## 2024-05-18 LAB — PULMONARY FUNCTION TEST
DL/VA % pred: 59 %
DL/VA: 2.36 ml/min/mmHg/L
DLCO unc % pred: 44 %
DLCO unc: 10.18 ml/min/mmHg
FEF 25-75 Post: 0.8 L/s
FEF 25-75 Pre: 0.96 L/s
FEF2575-%Change-Post: -17 %
FEF2575-%Pred-Post: 37 %
FEF2575-%Pred-Pre: 44 %
FEV1-%Change-Post: -2 %
FEV1-%Pred-Post: 60 %
FEV1-%Pred-Pre: 62 %
FEV1-Post: 1.65 L
FEV1-Pre: 1.7 L
FEV1FVC-%Change-Post: 0 %
FEV1FVC-%Pred-Pre: 87 %
FEV6-%Change-Post: -3 %
FEV6-%Pred-Post: 70 %
FEV6-%Pred-Pre: 73 %
FEV6-Post: 2.44 L
FEV6-Pre: 2.52 L
FEV6FVC-%Change-Post: -1 %
FEV6FVC-%Pred-Post: 102 %
FEV6FVC-%Pred-Pre: 103 %
FVC-%Change-Post: -2 %
FVC-%Pred-Post: 69 %
FVC-%Pred-Pre: 71 %
FVC-Post: 2.5 L
FVC-Pre: 2.55 L
Post FEV1/FVC ratio: 66 %
Post FEV6/FVC ratio: 98 %
Pre FEV1/FVC ratio: 67 %
Pre FEV6/FVC Ratio: 99 %
RV % pred: 153 %
RV: 3.77 L
TLC % pred: 107 %
TLC: 6.25 L

## 2024-05-18 MED ORDER — ALBUTEROL SULFATE (2.5 MG/3ML) 0.083% IN NEBU
2.5000 mg | INHALATION_SOLUTION | Freq: Once | RESPIRATORY_TRACT | Status: AC
  Administered 2024-05-18: 2.5 mg via RESPIRATORY_TRACT

## 2024-05-18 NOTE — Telephone Encounter (Signed)
" ° °  Name: [name]  DOB: 01-Aug-1955  MRN: [id number]  Primary Cardiologist: Redell Shallow, MD  Chart reviewed as part of pre-operative protocol coverage. Because of Anne [name]'s past medical history and time since last visit, she will require a follow-up in-office visit in order to better assess preoperative cardiovascular risk.  Pre-op covering staff: - Please schedule appointment and call patient to inform them. If patient already had an upcoming appointment within acceptable timeframe, please add pre-op clearance to the appointment notes so provider is aware. - Please contact requesting surgeon's office via preferred method (i.e, phone, fax) to inform them of need for appointment prior to surgery.  Patient is taking aspirin .  Would recommend continuing this throughout the perioperative period.   [name], PA-C  05/18/2024, 7:29 AM   "

## 2024-05-18 NOTE — Telephone Encounter (Signed)
 Pt has been scheduled to see Josefa Beauvais, FNP 06/15/24 @ 1:55 for preop clearance @ [address]. Pt has been given address for [address]

## 2024-06-15 ENCOUNTER — Ambulatory Visit: Admitting: General Practice
